# Patient Record
Sex: Male | Born: 1960 | Race: White | Hispanic: No | Smoking: Former smoker
Health system: Southern US, Community
[De-identification: ages and names within clinical notes are randomized; demographics above are authoritative.]

## PROBLEM LIST (undated history)

## (undated) ENCOUNTER — Emergency Department: Discharge status: Absent without leave | Payer: Medicaid Other

## (undated) DIAGNOSIS — Z720 Tobacco use: Secondary | ICD-10-CM

## (undated) DIAGNOSIS — I480 Paroxysmal atrial fibrillation: Secondary | ICD-10-CM

## (undated) DIAGNOSIS — I739 Peripheral vascular disease, unspecified: Secondary | ICD-10-CM

## (undated) DIAGNOSIS — Z9119 Patient's noncompliance with other medical treatment and regimen: Secondary | ICD-10-CM

## (undated) DIAGNOSIS — I499 Cardiac arrhythmia, unspecified: Secondary | ICD-10-CM

## (undated) DIAGNOSIS — E785 Hyperlipidemia, unspecified: Secondary | ICD-10-CM

## (undated) DIAGNOSIS — I5022 Chronic systolic (congestive) heart failure: Secondary | ICD-10-CM

## (undated) DIAGNOSIS — I429 Cardiomyopathy, unspecified: Secondary | ICD-10-CM

## (undated) DIAGNOSIS — E669 Obesity, unspecified: Secondary | ICD-10-CM

## (undated) DIAGNOSIS — I509 Heart failure, unspecified: Secondary | ICD-10-CM

## (undated) DIAGNOSIS — Z91199 Patient's noncompliance with other medical treatment and regimen due to unspecified reason: Secondary | ICD-10-CM

## (undated) DIAGNOSIS — I2510 Atherosclerotic heart disease of native coronary artery without angina pectoris: Secondary | ICD-10-CM

## (undated) DIAGNOSIS — I10 Essential (primary) hypertension: Secondary | ICD-10-CM

## (undated) DIAGNOSIS — E119 Type 2 diabetes mellitus without complications: Secondary | ICD-10-CM

## (undated) DIAGNOSIS — I255 Ischemic cardiomyopathy: Secondary | ICD-10-CM

## (undated) DIAGNOSIS — I219 Acute myocardial infarction, unspecified: Secondary | ICD-10-CM

## (undated) DIAGNOSIS — F419 Anxiety disorder, unspecified: Secondary | ICD-10-CM

## (undated) HISTORY — DX: Patient's noncompliance with other medical treatment and regimen: Z91.19

## (undated) HISTORY — PX: OTHER SURGICAL HISTORY: SHX169

## (undated) HISTORY — DX: Obesity, unspecified: E66.9

## (undated) HISTORY — DX: Essential (primary) hypertension: I10

## (undated) HISTORY — DX: Anxiety disorder, unspecified: F41.9

## (undated) HISTORY — DX: Ischemic cardiomyopathy: I25.5

## (undated) HISTORY — DX: Chronic systolic (congestive) heart failure: I50.22

## (undated) HISTORY — PX: CARDIAC CATHETERIZATION: SHX172

## (undated) HISTORY — DX: Patient's noncompliance with other medical treatment and regimen due to unspecified reason: Z91.199

## (undated) HISTORY — DX: Atherosclerotic heart disease of native coronary artery without angina pectoris: I25.10

## (undated) HISTORY — DX: Heart failure, unspecified: I50.9

## (undated) HISTORY — DX: Paroxysmal atrial fibrillation: I48.0

## (undated) HISTORY — DX: Cardiomyopathy, unspecified: I42.9

## (undated) HISTORY — DX: Hyperlipidemia, unspecified: E78.5

## (undated) HISTORY — DX: Tobacco use: Z72.0

## (undated) HISTORY — DX: Peripheral vascular disease, unspecified: I73.9

## (undated) HISTORY — DX: Acute myocardial infarction, unspecified: I21.9

## (undated) HISTORY — DX: Type 2 diabetes mellitus without complications: E11.9

## (undated) HISTORY — PX: LASIK: SHX215

---

## 2006-08-01 ENCOUNTER — Ambulatory Visit: Payer: Self-pay | Admitting: Family Medicine

## 2006-09-12 ENCOUNTER — Ambulatory Visit: Payer: Self-pay | Admitting: Family Medicine

## 2006-10-05 ENCOUNTER — Ambulatory Visit: Payer: Self-pay | Admitting: Family Medicine

## 2007-05-16 ENCOUNTER — Other Ambulatory Visit: Payer: Self-pay

## 2007-05-16 ENCOUNTER — Ambulatory Visit: Payer: Self-pay | Admitting: Family Medicine

## 2007-06-09 ENCOUNTER — Other Ambulatory Visit: Payer: Self-pay

## 2007-06-09 ENCOUNTER — Emergency Department: Payer: Self-pay | Admitting: Unknown Physician Specialty

## 2007-10-18 ENCOUNTER — Emergency Department: Payer: Self-pay | Admitting: Emergency Medicine

## 2007-10-18 ENCOUNTER — Other Ambulatory Visit: Payer: Self-pay

## 2013-07-05 DIAGNOSIS — I5022 Chronic systolic (congestive) heart failure: Secondary | ICD-10-CM

## 2013-07-05 HISTORY — DX: Chronic systolic (congestive) heart failure: I50.22

## 2013-07-20 LAB — CBC
HGB: 16.9 g/dL (ref 13.0–18.0)
MCHC: 34.6 g/dL (ref 32.0–36.0)
Platelet: 237 10*3/uL (ref 150–440)
RBC: 5.2 10*6/uL (ref 4.40–5.90)
RDW: 13.7 % (ref 11.5–14.5)

## 2013-07-21 ENCOUNTER — Inpatient Hospital Stay: Payer: Self-pay | Admitting: Internal Medicine

## 2013-07-21 DIAGNOSIS — I4891 Unspecified atrial fibrillation: Secondary | ICD-10-CM

## 2013-07-21 DIAGNOSIS — F17200 Nicotine dependence, unspecified, uncomplicated: Secondary | ICD-10-CM

## 2013-07-21 DIAGNOSIS — I214 Non-ST elevation (NSTEMI) myocardial infarction: Secondary | ICD-10-CM

## 2013-07-21 DIAGNOSIS — I5041 Acute combined systolic (congestive) and diastolic (congestive) heart failure: Secondary | ICD-10-CM

## 2013-07-21 DIAGNOSIS — I059 Rheumatic mitral valve disease, unspecified: Secondary | ICD-10-CM

## 2013-07-21 DIAGNOSIS — I4892 Unspecified atrial flutter: Secondary | ICD-10-CM

## 2013-07-21 LAB — COMPREHENSIVE METABOLIC PANEL
Albumin: 3.7 g/dL (ref 3.4–5.0)
Alkaline Phosphatase: 102 U/L (ref 50–136)
Anion Gap: 8 (ref 7–16)
Calcium, Total: 8.9 mg/dL (ref 8.5–10.1)
Chloride: 103 mmol/L (ref 98–107)
Co2: 27 mmol/L (ref 21–32)
Creatinine: 1.17 mg/dL (ref 0.60–1.30)
EGFR (African American): >60
Glucose: 192 mg/dL — ABNORMAL HIGH (ref 65–99)
Osmolality: 281 (ref 275–301)
SGPT (ALT): 25 U/L (ref 12–78)
Total Protein: 7.3 g/dL (ref 6.4–8.2)

## 2013-07-21 LAB — CK TOTAL AND CKMB (NOT AT ARMC)
CK, Total: 149 U/L (ref 35–232)
CK, Total: 184 U/L (ref 35–232)
CK, Total: 221 U/L (ref 35–232)
CK-MB: 3.8 ng/mL — ABNORMAL HIGH (ref 0.5–3.6)
CK-MB: 3.5 ng/mL (ref 0.5–3.6)

## 2013-07-21 LAB — TROPONIN I: Troponin-I: 0.12 ng/mL — ABNORMAL HIGH

## 2013-07-21 LAB — LIPID PANEL
Cholesterol: 135 mg/dL (ref 0–200)
HDL Cholesterol: 39 mg/dL — ABNORMAL LOW (ref 40–60)
VLDL Cholesterol, Calc: 30 mg/dL (ref 5–40)

## 2013-07-22 ENCOUNTER — Encounter: Payer: Self-pay | Admitting: *Deleted

## 2013-07-22 ENCOUNTER — Ambulatory Visit (INDEPENDENT_AMBULATORY_CARE_PROVIDER_SITE_OTHER): Payer: Self-pay | Admitting: Cardiovascular Disease

## 2013-07-22 ENCOUNTER — Encounter: Payer: Self-pay | Admitting: Cardiovascular Disease

## 2013-07-22 ENCOUNTER — Telehealth: Payer: Self-pay

## 2013-07-22 VITALS — BP 180/120 | HR 94 | Ht 73.0 in | Wt 243.0 lb

## 2013-07-22 DIAGNOSIS — I10 Essential (primary) hypertension: Secondary | ICD-10-CM

## 2013-07-22 DIAGNOSIS — I5022 Chronic systolic (congestive) heart failure: Secondary | ICD-10-CM

## 2013-07-22 DIAGNOSIS — R079 Chest pain, unspecified: Secondary | ICD-10-CM

## 2013-07-22 DIAGNOSIS — I4891 Unspecified atrial fibrillation: Secondary | ICD-10-CM

## 2013-07-22 DIAGNOSIS — E785 Hyperlipidemia, unspecified: Secondary | ICD-10-CM

## 2013-07-22 MED ORDER — AMLODIPINE BESYLATE 10 MG PO TABS: 10.0000 mg | ORAL_TABLET | Freq: Every day | ORAL | Status: DC

## 2013-07-22 MED ORDER — ROSUVASTATIN CALCIUM 20 MG PO TABS: 20.0000 mg | ORAL_TABLET | Freq: Every day | ORAL | Status: DC

## 2013-07-22 MED ORDER — RIVAROXABAN 20 MG PO TABS: 20.0000 mg | ORAL_TABLET | Freq: Every day | ORAL | Status: DC

## 2013-07-22 MED ORDER — LISINOPRIL 40 MG PO TABS: 40.0000 mg | ORAL_TABLET | Freq: Every day | ORAL | Status: DC

## 2013-07-22 MED ORDER — CARVEDILOL 12.5 MG PO TABS: 12.5000 mg | ORAL_TABLET | Freq: Two times a day (BID) | ORAL | Status: DC

## 2013-07-22 MED ORDER — METFORMIN HCL 1000 MG PO TABS: 1000.0000 mg | ORAL_TABLET | Freq: Two times a day (BID) | ORAL | Status: DC

## 2013-07-22 NOTE — Telephone Encounter (Signed)
Patient contacted regarding discharge from [facility] on 07/21/13.  # disconnected.

## 2013-07-22 NOTE — Progress Notes (Signed)
HPI  This is a 52 year old male who is here for a followup visit after recent hospitalization at [facility]. He has chronic medical conditions that include type 2 diabetes, hypertension, hyperlipidemia, tobacco use and obesity. He presented recently to [facility] with significant dyspnea and orthopnea. He ran out of his medications 2 months prior to presentation. He has no health insurance and usually follows up with the walk-in clinic. He is established through [employer] but for some reason his, he did not get his medications. He had significant exertional dyspnea and chest discomfort on presentation. His troponin was mildly elevated with a BNP of 1900. Blood pressure on presentation was 225/115. Shortly after hospital admission, he developed atrial fibrillation with rapid ventricular response with a heart rate of 150 beats per minute. He improved gradually with medical therapy. Echocardiogram showed an ejection fraction of 45-50% with inferior apical hypokinesis. We recommended ischemic cardiac evaluation while hospitalized but the patient declined. We also discussed anticoagulation given [place] VASc score of 3. We wanted the patient to establish some form of followup before initiating this. He was discharged home but has not received his medications yet. His blood pressure is elevated today. He still feels the same.  No Known Allergies   No current outpatient prescriptions on file prior to visit.   No current facility-administered medications on file prior to visit.     Past Medical History  Diagnosis Date  . Diabetes mellitus without complication   . Tobacco abuse   . Obesity   . Cardiomyopathy     45-50%  . CHF (congestive heart failure)   . Anxiety   . Medical non-compliance   . Chronic systolic heart failure 07/2013    Ejection fraction of 45-50% with possible inferior wall hypokinesis  . Hypertension   . Hyperlipidemia      Past Surgical History  Procedure Laterality Date  . Cardiac  catheterization      [facility]. no stent  . Knee surgery Right      Family History  Problem Relation Age of Onset  . Heart disease Mother      History   Social History  . Marital Status: Single    Spouse Name: N/A    Number of Children: N/A  . Years of Education: N/A   Occupational History  . Not on file.   Social History Main Topics  . Smoking status: Current Every Day Smoker -- 1.00 packs/day for 45 years    Types: Cigarettes  . Smokeless tobacco: Not on file  . Alcohol Use: Yes     Comment: occassional  . Drug Use: No  . Sexual Activity: Not on file   Other Topics Concern  . Not on file   Social History Narrative  . No narrative on file     PHYSICAL EXAM   BP 180/120  Pulse 94  Ht 6\' 1"  (1.854 m)  Wt 243 lb (110.224 kg)  BMI 32.07 kg/m2 Constitutional: He is oriented to person, place, and time. He appears well-developed and well-nourished. No distress.  HENT: No nasal discharge.  Head: Normocephalic and atraumatic.  Eyes: Pupils are equal and round.  No discharge. Neck: Normal range of motion. Neck supple. No JVD present. No thyromegaly present.  Cardiovascular: Normal rate, irregular rhythm, normal heart sounds. Exam reveals no gallop and no friction rub. No murmur heard.  Pulmonary/Chest: Effort normal and breath sounds normal. No stridor. No respiratory distress. He has no wheezes. He has no rales. He exhibits no tenderness.  Abdominal: Soft. Bowel sounds are normal. He exhibits no distension. There is no tenderness. There is no rebound and no guarding.  Musculoskeletal: Normal range of motion. He exhibits no edema and no tenderness.  Neurological: He is alert and oriented to person, place, and time. Coordination normal.  Skin: Skin is warm and dry. No rash noted. He is not diaphoretic. No erythema. No pallor.  Psychiatric: He has a normal mood and affect. His behavior is normal. Judgment and thought content normal.       EKG: Atrial  flutter-fibrillation  - frequent multiform ectopic ventricular beats  # VECs = 2, # types 2 -Nonspecific QRS widening and right axis -consider right ventricular hypertrophy.   -Nonspecific ST depression   +   Extensive T-abnormality  -Possible  Anterolateral and inferior  ischemia.    ASSESSMENT AND PLAN

## 2013-07-22 NOTE — Patient Instructions (Signed)
Take only the medications listed on this list. Multiple changes were made.   Follow up 1 week.

## 2013-07-29 ENCOUNTER — Encounter: Payer: Self-pay | Admitting: *Deleted

## 2013-07-29 ENCOUNTER — Ambulatory Visit: Payer: Self-pay | Admitting: Cardiovascular Disease

## 2013-07-29 ENCOUNTER — Encounter: Payer: Self-pay | Admitting: Cardiovascular Disease

## 2013-07-29 DIAGNOSIS — I480 Paroxysmal atrial fibrillation: Secondary | ICD-10-CM | POA: Insufficient documentation

## 2013-07-29 DIAGNOSIS — I10 Essential (primary) hypertension: Secondary | ICD-10-CM | POA: Insufficient documentation

## 2013-07-29 DIAGNOSIS — I4891 Unspecified atrial fibrillation: Secondary | ICD-10-CM | POA: Insufficient documentation

## 2013-07-29 DIAGNOSIS — E785 Hyperlipidemia, unspecified: Secondary | ICD-10-CM | POA: Insufficient documentation

## 2013-07-29 NOTE — Assessment & Plan Note (Addendum)
Likely worsened by uncontrolled hypertension. There is also high suspicion for underlying coronary artery disease given the wall motion abnormality noted on echocardiogram with multiple risk factors for coronary artery disease. At this point, I recommend aggressive control of his hypertension. I don't think clonidine is a good option due to the effect of rebound hypertension when he does not take the medication. I recommend stopping clonidine and metoprolol. Start carvedilol 12.5 mg twice daily. Start lisinopril 40 mg once daily. Check basic metabolic profile in one week. Continue treatment with amlodipine. Ischemic cardiac evaluation is needed once his blood pressure was controlled. He will need a close followup visit in one week in order to adjust his medications as needed.

## 2013-07-29 NOTE — Assessment & Plan Note (Signed)
Continue rate control with carvedilol. I discussed long-term anticoagulation and different options. We decided to start Xarelto 20 mg once daily.

## 2013-07-29 NOTE — Assessment & Plan Note (Signed)
Blood pressure is elevated. Multiple changes were made as outlined above.

## 2013-07-29 NOTE — Assessment & Plan Note (Signed)
Switching him to Crestor as he qualifies for assistance from [employer].

## 2013-08-11 ENCOUNTER — Inpatient Hospital Stay: Payer: Self-pay | Admitting: Internal Medicine

## 2013-08-11 LAB — BASIC METABOLIC PANEL
BUN: 18 mg/dL (ref 7–18)
Chloride: 100 mmol/L (ref 98–107)
Creatinine: 0.87 mg/dL (ref 0.60–1.30)
Glucose: 281 mg/dL — ABNORMAL HIGH (ref 65–99)
Osmolality: 280 (ref 275–301)
Sodium: 134 mmol/L — ABNORMAL LOW (ref 136–145)

## 2013-08-11 LAB — CBC
MCHC: 33.4 g/dL (ref 32.0–36.0)
MCV: 93 fL (ref 80–100)
Platelet: 225 10*3/uL (ref 150–440)
RDW: 13.5 % (ref 11.5–14.5)
WBC: 11.4 10*3/uL — ABNORMAL HIGH (ref 3.8–10.6)

## 2013-08-11 LAB — TROPONIN I: Troponin-I: 0.03 ng/mL

## 2013-08-11 LAB — CK TOTAL AND CKMB (NOT AT ARMC)
CK, Total: 69 U/L (ref 35–232)
CK-MB: 1.5 ng/mL (ref 0.5–3.6)

## 2013-08-12 DIAGNOSIS — I5043 Acute on chronic combined systolic (congestive) and diastolic (congestive) heart failure: Secondary | ICD-10-CM

## 2013-08-12 DIAGNOSIS — I4892 Unspecified atrial flutter: Secondary | ICD-10-CM

## 2013-08-12 DIAGNOSIS — I2510 Atherosclerotic heart disease of native coronary artery without angina pectoris: Secondary | ICD-10-CM

## 2013-08-12 DIAGNOSIS — I119 Hypertensive heart disease without heart failure: Secondary | ICD-10-CM

## 2013-08-12 DIAGNOSIS — I4891 Unspecified atrial fibrillation: Secondary | ICD-10-CM

## 2013-08-12 LAB — BASIC METABOLIC PANEL
BUN: 16 mg/dL (ref 7–18)
Chloride: 103 mmol/L (ref 98–107)
EGFR (African American): >60
EGFR (Non-African Amer.): >60
Potassium: 3.4 mmol/L — ABNORMAL LOW (ref 3.5–5.1)
Sodium: 137 mmol/L (ref 136–145)

## 2013-08-12 LAB — CBC WITH DIFFERENTIAL/PLATELET
Basophil #: 0.1 10*3/uL (ref 0.0–0.1)
Basophil %: 0.6 %
Eosinophil #: 0.2 10*3/uL (ref 0.0–0.7)
Eosinophil %: 1.7 %
HCT: 42.5 % (ref 40.0–52.0)
HGB: 14.5 g/dL (ref 13.0–18.0)
Lymphocyte #: 1.7 10*3/uL (ref 1.0–3.6)
MCHC: 34.2 g/dL (ref 32.0–36.0)
Monocyte #: 0.8 x10 3/mm (ref 0.2–1.0)
Neutrophil %: 72.5 %
Platelet: 205 10*3/uL (ref 150–440)
RBC: 4.58 10*6/uL (ref 4.40–5.90)

## 2013-08-12 LAB — TSH: Thyroid Stimulating Horm: 0.196 u[IU]/mL — ABNORMAL LOW

## 2013-08-12 LAB — HEMOGLOBIN A1C: Hemoglobin A1C: 9 % — ABNORMAL HIGH (ref 4.2–6.3)

## 2013-08-12 LAB — CK TOTAL AND CKMB (NOT AT ARMC): CK, Total: 56 U/L (ref 35–232)

## 2013-08-12 LAB — TROPONIN I: Troponin-I: 0.04 ng/mL

## 2013-08-14 ENCOUNTER — Telehealth: Payer: Self-pay | Admitting: *Deleted

## 2013-08-14 NOTE — Telephone Encounter (Signed)
Patient contacted regarding discharge from [facility] on 08/12/13  Patient understands to follow up with provider Arida on 08/21/13 at 11am at [place]. Patient understands discharge instructions? yes Patient understands medications and regiment? yes  Patient understands to bring all medications to this visit? yes

## 2013-08-21 ENCOUNTER — Ambulatory Visit (INDEPENDENT_AMBULATORY_CARE_PROVIDER_SITE_OTHER): Payer: Self-pay | Admitting: Cardiovascular Disease

## 2013-08-21 ENCOUNTER — Encounter: Payer: Self-pay | Admitting: Cardiovascular Disease

## 2013-08-21 VITALS — BP 146/95 | HR 91 | Ht 73.0 in | Wt 238.0 lb

## 2013-08-21 DIAGNOSIS — I2510 Atherosclerotic heart disease of native coronary artery without angina pectoris: Secondary | ICD-10-CM

## 2013-08-21 DIAGNOSIS — I5022 Chronic systolic (congestive) heart failure: Secondary | ICD-10-CM

## 2013-08-21 DIAGNOSIS — I4891 Unspecified atrial fibrillation: Secondary | ICD-10-CM

## 2013-08-21 DIAGNOSIS — I10 Essential (primary) hypertension: Secondary | ICD-10-CM

## 2013-08-21 MED ORDER — CARVEDILOL 25 MG PO TABS: 25.0000 mg | ORAL_TABLET | Freq: Two times a day (BID) | ORAL | Status: DC

## 2013-08-21 NOTE — Progress Notes (Signed)
HPI  This is a 52 year old male who is here for a followup visit after recent hospitalization at [facility]. He has chronic medical conditions that include chronic systolic heart failure, atrial fibrillation, type 2 diabetes, hypertension, hyperlipidemia, tobacco use and obesity. He was recently diagnosed with acute systolic heart failure in the setting of uncontrolled hypertension. His troponin was mildly elevated with a BNP of 1900. Blood pressure on presentation was 225/115. Shortly after hospital admission, he developed atrial fibrillation with rapid ventricular response with a heart rate of 150 beats per minute. He improved gradually with medical therapy. Echocardiogram showed an ejection fraction of 45-50% with inferior apical hypokinesis. Upon followup, he was again without medications and they arranged for him to get medications through [place] assistance program. He presented again to [facility] with acute on chronic systolic heart failure and was discharged home on furosemide. He has been doing better continues to have significant exertional dyspnea. He was noted to have coronary calcifications on CT scan. Previous cardiac catheterization in 1998 at [facility] showed significant two-vessel coronary artery disease with 75% in proximal RCA and 95% in the mid RCA (nondominant RCA) , 95% into OM 2 and 50% stenosis in mid LAD. Ejection fraction was normal. It appears that he was treated medically.  No Known Allergies   Current Outpatient Prescriptions on File Prior to Visit  Medication Sig Dispense Refill  . ALPRAZolam (XANAX) 0.25 MG tablet Take 0.25 mg by mouth at bedtime as needed for anxiety.      Marland Kitchen amLODipine (NORVASC) 10 MG tablet Take 1 tablet (10 mg total) by mouth daily.  30 tablet  6  . carvedilol (COREG) 12.5 MG tablet Take 1 tablet (12.5 mg total) by mouth 2 (two) times daily.  60 tablet  6  . lisinopril (PRINIVIL,ZESTRIL) 40 MG tablet Take 1 tablet (40 mg total) by mouth daily.  30 tablet  6  .  metFORMIN (GLUCOPHAGE) 1000 MG tablet Take 1 tablet (1,000 mg total) by mouth 2 (two) times daily with a meal.  60 tablet  6   No current facility-administered medications on file prior to visit.     Past Medical History  Diagnosis Date  . Diabetes mellitus without complication   . Tobacco abuse   . Obesity   . Cardiomyopathy     45-50%  . CHF (congestive heart failure)   . Anxiety   . Medical non-compliance   . Chronic systolic heart failure 07/2013    Ejection fraction of 45-50% with possible inferior wall hypokinesis  . Hypertension   . Hyperlipidemia   . MI (myocardial infarction)      Past Surgical History  Procedure Laterality Date  . Cardiac catheterization      [facility]. no stent  . Knee surgery Right      Family History  Problem Relation Age of Onset  . Heart disease Mother      History   Social History  . Marital Status: Single    Spouse Name: N/A    Number of Children: N/A  . Years of Education: N/A   Occupational History  . Not on file.   Social History Main Topics  . Smoking status: Current Every Day Smoker -- 1.00 packs/day for 45 years    Types: Cigarettes  . Smokeless tobacco: Not on file  . Alcohol Use: Yes     Comment: occassional  . Drug Use: No  . Sexual Activity: Not on file   Other Topics Concern  . Not  on file   Social History Narrative  . No narrative on file     PHYSICAL EXAM   BP 146/95  Pulse 91  Ht 6\' 1"  (1.854 m)  Wt 238 lb (107.956 kg)  BMI 31.41 kg/m2 Constitutional: He is oriented to person, place, and time. He appears well-developed and well-nourished. No distress.  HENT: No nasal discharge.  Head: Normocephalic and atraumatic.  Eyes: Pupils are equal and round.  No discharge. Neck: Normal range of motion. Neck supple. No JVD present. No thyromegaly present.  Cardiovascular: Normal rate, irregular rhythm, normal heart sounds. Exam reveals no gallop and no friction rub. No murmur heard.  Pulmonary/Chest:  Effort normal and breath sounds normal. No stridor. No respiratory distress. He has no wheezes. He has no rales. He exhibits no tenderness.  Abdominal: Soft. Bowel sounds are normal. He exhibits no distension. There is no tenderness. There is no rebound and no guarding.  Musculoskeletal: Normal range of motion. He exhibits no edema and no tenderness.  Neurological: He is alert and oriented to person, place, and time. Coordination normal.  Skin: Skin is warm and dry. No rash noted. He is not diaphoretic. No erythema. No pallor.  Psychiatric: He has a normal mood and affect. His behavior is normal. Judgment and thought content normal.       EKG: Atrial fibrillation   ASSESSMENT AND PLAN

## 2013-08-21 NOTE — Assessment & Plan Note (Signed)
The patient has known history of coronary artery disease based on cardiac catheterization in the past in 1998. Currently, he is having recurrent episodes of heart failure with mildly reduced LV systolic function. There is very high likelihood of coronary artery disease progression based on his multiple risk factors. His echo also showed wall motion abnormalities. Due to all of that, I recommend proceeding with cardiac catheterization and possible coronary intervention. Risks, benefits and alternatives were discussed with him.

## 2013-08-21 NOTE — Assessment & Plan Note (Signed)
Continue rate control. I'm increasing the dose of carvedilol to 25 mg twice daily. [place] VASc score is 4. He was started on Xarelto but we are waiting for the company's approval to provide with supplies. This will not be started until after his cardiac catheterization.

## 2013-08-21 NOTE — Assessment & Plan Note (Signed)
Blood pressure is still elevated. Increase the dose of carvedilol to 25 mg twice daily.

## 2013-08-21 NOTE — Assessment & Plan Note (Signed)
He appears to be euvolemic after the addition of small dose furosemide. He will be having routine labs today to check his renal function. Optimize blood pressure control with carvedilol and lisinopril.

## 2013-08-21 NOTE — Patient Instructions (Addendum)
[facility] Cardiac Cath Instructions   You are scheduled for a Cardiac Cath on:____December 23, 2014___  Please arrive at ___07:30am on the day of your procedure  You will need to pre-register prior to the day of your procedure.  Enter through the [employer] at [facility].  Registration is the first desk on your right.  Please take the procedure order we have given you in order to be registered appropriately  Do not eat/drink anything after midnight  Someone will need to drive you home  It is recommended someone be with you for the first 24 hours after your procedure  Wear clothes that are easy to get on/off and wear slip on shoes if possible   Medications bring a current list of all medications with you  ___ You may take all of your medications the morning of your procedure with enough water to swallow safely  _x_ Do not take these medications before your procedure:_Metformin_24 prior 48 hours after ____________________________________________________________________________________________________________________________________________________________________   Day of your procedure: Arrive at the [facility] entrance.  Free valet service is available.  After entering the [facility] please check-in at the registration desk (1st desk on your right) to receive your armband. After receiving your armband someone will escort you to the cardiac cath/special procedures waiting area.  The usual length of stay after your procedure is about 2 to 3 hours.  This can vary.  If you have any questions, please call our office at [phone], or you may call the cardiac cath lab at [facility] directly at [phone]  Your physician recommends that you return for lab work in:  Pre Cath Labs   Your physician has recommended you make the following change in your medication:  Increase Coreg to 25 mg twice a day

## 2013-08-22 LAB — BASIC METABOLIC PANEL
CO2: 19 mmol/L (ref 18–29)
Calcium: 10.1 mg/dL (ref 8.7–10.2)
Chloride: 95 mmol/L — ABNORMAL LOW (ref 97–108)
GFR calc non Af Amer: 82 mL/min/{1.73_m2} (ref >59)
Glucose: 249 mg/dL — ABNORMAL HIGH (ref 65–99)
Potassium: 4.4 mmol/L (ref 3.5–5.2)

## 2013-08-22 LAB — CBC WITH DIFFERENTIAL/PLATELET
Basophils Absolute: 0 10*3/uL (ref 0.0–0.2)
Basos: 1 %
Eosinophils Absolute: 0.3 10*3/uL (ref 0.0–0.4)
Immature Grans (Abs): 0 10*3/uL (ref 0.0–0.1)
Immature Granulocytes: 0 %
Lymphs: 25 %
MCH: 32.5 pg (ref 26.6–33.0)
MCHC: 34.3 g/dL (ref 31.5–35.7)
MCV: 95 fL (ref 79–97)
Monocytes Absolute: 0.5 10*3/uL (ref 0.1–0.9)
Neutrophils Relative %: 63 %
RBC: 5.2 x10E6/uL (ref 4.14–5.80)
RDW: 13.1 % (ref 12.3–15.4)

## 2013-08-22 LAB — PROTIME-INR
INR: 1.1 (ref 0.8–1.2)
Prothrombin Time: 10.8 s (ref 9.1–12.0)

## 2013-08-26 ENCOUNTER — Ambulatory Visit: Payer: Self-pay | Admitting: Cardiovascular Disease

## 2013-08-26 ENCOUNTER — Encounter: Payer: Self-pay | Admitting: Cardiovascular Disease

## 2013-08-26 DIAGNOSIS — I2510 Atherosclerotic heart disease of native coronary artery without angina pectoris: Secondary | ICD-10-CM

## 2013-09-03 ENCOUNTER — Encounter: Payer: Self-pay | Admitting: Surgery

## 2013-09-05 ENCOUNTER — Other Ambulatory Visit: Payer: Self-pay | Admitting: *Deleted

## 2013-09-05 ENCOUNTER — Institutional Professional Consult (permissible substitution) (INDEPENDENT_AMBULATORY_CARE_PROVIDER_SITE_OTHER): Payer: Self-pay | Admitting: Surgery

## 2013-09-05 ENCOUNTER — Encounter: Payer: Self-pay | Admitting: Surgery

## 2013-09-05 VITALS — BP 153/92 | HR 86 | Resp 16 | Ht 73.0 in | Wt 235.0 lb

## 2013-09-05 DIAGNOSIS — I2510 Atherosclerotic heart disease of native coronary artery without angina pectoris: Secondary | ICD-10-CM

## 2013-09-06 ENCOUNTER — Encounter: Payer: Self-pay | Admitting: Surgery

## 2013-09-06 NOTE — Progress Notes (Signed)
PCP is No PCP Per Patient Referring Provider is Marc [name], Marc A, MD  Chief Complaint  Patient presents with  . Coronary Artery Disease    eval for CABG...[facility]    HPI:  He is [name] with diabetes, HTN, and hyperlipidemia who has [name] known history of coronary disease by [facility]. He reportedly had significant 3-vessel disease with high grade stenosis in [name] nondominant RCA, 95% OM2, and 50% mid LAD with normal EF. He was treated medically. He recently presented with shortness of breath with mild exertion and was diagnosed with acute systolic heart failure in the setting of uncontrolled hypertension. His troponin was mildly elevated with [name] BNP of 1900. He developed [name]-fib with RVR after admission. An echo showed an EF of 45-50% with infero-apical hypokinesis. He improved with medical therapy. On followup he was not taking his medications and arrangements were made for him to get his meds through the [place] assistance program. He presented again to [facility] with acute on chronic systolic heart failure and was started on Lasix. He continues to have exertional dyspnea, fatigue but no chest pain. Cardiac cath now shows severe 3-vessel coronary disease with 60% distal LM, 80% LAD, 90% stenosis of 3 marginal branches in the dominant LCX, and an occluded nondominant RCA.  Past Medical History  Diagnosis Date  . Diabetes mellitus without complication   . Tobacco abuse   . Obesity   . Cardiomyopathy     45-50%  . CHF (congestive heart failure)   . Anxiety   . Medical non-compliance   . Chronic systolic heart failure 07/2013    Ejection fraction of 45-50% with possible inferior wall hypokinesis  . Hypertension   . Hyperlipidemia   . MI (myocardial infarction)   . Coronary artery disease     Past Surgical History  Procedure Laterality Date  . Cardiac catheterization      [facility] no stent  . Knee surgery Right     Family  History  Problem Relation Age of Onset  . Heart disease Mother     Social History History  Substance Use Topics  . Smoking status: Current Every Day Smoker -- 1.00 packs/day for 45 years    Types: Cigarettes  . Smokeless tobacco: Never Used  . Alcohol Use: No     Comment: occassional    Current Outpatient Prescriptions  Medication Sig Dispense Refill  . ALPRAZolam (XANAX) 0.25 MG tablet Take 0.25 mg by mouth at bedtime as needed for anxiety.      Marland Kitchen. amLODipine (NORVASC) 10 MG tablet Take 1 tablet (10 mg total) by mouth daily.  30 tablet  6  . aspirin 325 MG tablet Take 325 mg by mouth daily.      . carvedilol (COREG) 25 MG tablet Take 1 tablet (25 mg total) by mouth 2 (two) times daily.  60 tablet  6  . furosemide (LASIX) 40 MG tablet Take 40 mg by mouth daily.      Marland Kitchen. lisinopril (PRINIVIL,ZESTRIL) 40 MG tablet Take 1 tablet (40 mg total) by mouth daily.  30 tablet  6  . metFORMIN (GLUCOPHAGE) 1000 MG tablet Take 1 tablet (1,000 mg total) by mouth 2 (two) times daily with [name] meal.  60 tablet  6  . potassium chloride SA (K-DUR,KLOR-CON) 20 MEQ tablet Take 20 mEq by mouth daily.       No current facility-administered medications for this visit.  No Known Allergies  Review of Systems  Constitutional: Positive for activity change and fatigue.  HENT: Negative.   Eyes: Negative.   Respiratory: Positive for shortness of breath.   Cardiovascular: Negative for chest pain, palpitations and leg swelling.       No orthopnea or PND  Gastrointestinal: Negative.   Endocrine: Negative.   Genitourinary: Negative.   Musculoskeletal: Negative.   Skin: Negative.   Allergic/Immunologic: Negative.   Neurological: Negative.   Hematological: Negative.   Psychiatric/Behavioral: Negative.     BP 153/92  Pulse 86  Resp 16  Ht 6\' 1"  (1.854 m)  Wt 235 lb (106.595 kg)  BMI 31.01 kg/m2  SpO2 98% Physical Exam  Constitutional: He is oriented to person, place, and time. He appears  well-developed and well-nourished. No distress.  HENT:  Head: Normocephalic and atraumatic.  Mouth/Throat: Oropharynx is clear and moist.  Eyes: EOM are normal. Pupils are equal, round, and reactive to light.  Neck: Normal range of motion. Neck supple. No JVD present. No thyromegaly present.  Cardiovascular: Normal rate, regular rhythm, normal heart sounds and intact distal pulses.   No murmur heard. Pulmonary/Chest: Effort normal and breath sounds normal. No respiratory distress. He has no wheezes. He has no rales.  Abdominal: Soft. Bowel sounds are normal. He exhibits no distension and no mass. There is no tenderness.  Musculoskeletal: Normal range of motion. He exhibits no edema.  Neurological: He is alert and oriented to person, place, and time. He has normal strength. No cranial nerve deficit or sensory deficit.  Skin: Skin is warm and dry.  Psychiatric: He has [name] normal mood and affect.     Diagnostic Tests:  Cardiac cath films from [facility] reviewed by me in the cloud.   Impression:  He has left main and severe 3- vessel coronary disease with mild LV dysfunction and is having acute on chronic systolic heart failure with recurrent hospitalizations. I agree that CABG is indicated to prevent further ischemia, infarction, and heart failure. This will have to be followed by maximum cardiac risk factor reduction including smoking cessation. I discussed all of this with the patient. I discussed the operative procedure with the patient  including alternatives, benefits and risks; including but not limited to bleeding, blood transfusion, infection, stroke, myocardial infarction, graft failure, heart block requiring [name] permanent pacemaker, organ dysfunction, and death.  Marc [name] understands and agrees to proceed.    Plan:  We will plan surgery for 09/16/2012.

## 2013-09-08 ENCOUNTER — Encounter (HOSPITAL_COMMUNITY): Payer: Self-pay | Admitting: Respiratory Therapy

## 2013-09-11 ENCOUNTER — Encounter: Payer: Self-pay | Admitting: Cardiovascular Disease

## 2013-09-12 ENCOUNTER — Ambulatory Visit (HOSPITAL_COMMUNITY)
Admission: RE | Admit: 2013-09-12 | Discharge: 2013-09-12 | Disposition: A | Discharge status: Home / Self Care | Payer: Medicaid Other | Source: Ambulatory Visit | Attending: Surgery | Admitting: Surgery

## 2013-09-12 ENCOUNTER — Encounter (HOSPITAL_COMMUNITY)
Admission: RE | Admit: 2013-09-12 | Discharge: 2013-09-12 | Disposition: A | Discharge status: Home / Self Care | Payer: Medicaid Other | Source: Ambulatory Visit | Attending: Surgery | Admitting: Surgery

## 2013-09-12 ENCOUNTER — Encounter (HOSPITAL_COMMUNITY): Payer: Self-pay

## 2013-09-12 VITALS — BP 151/94 | HR 84 | Temp 98.1°F | Resp 18 | Ht 73.0 in | Wt 236.2 lb

## 2013-09-12 DIAGNOSIS — I2510 Atherosclerotic heart disease of native coronary artery without angina pectoris: Secondary | ICD-10-CM | POA: Insufficient documentation

## 2013-09-12 DIAGNOSIS — Z01811 Encounter for preprocedural respiratory examination: Secondary | ICD-10-CM | POA: Insufficient documentation

## 2013-09-12 DIAGNOSIS — I4460 Unspecified fascicular block: Secondary | ICD-10-CM | POA: Insufficient documentation

## 2013-09-12 DIAGNOSIS — Z01818 Encounter for other preprocedural examination: Secondary | ICD-10-CM | POA: Insufficient documentation

## 2013-09-12 DIAGNOSIS — E119 Type 2 diabetes mellitus without complications: Secondary | ICD-10-CM | POA: Insufficient documentation

## 2013-09-12 DIAGNOSIS — Z01810 Encounter for preprocedural cardiovascular examination: Secondary | ICD-10-CM

## 2013-09-12 DIAGNOSIS — I428 Other cardiomyopathies: Secondary | ICD-10-CM | POA: Insufficient documentation

## 2013-09-12 DIAGNOSIS — Z87891 Personal history of nicotine dependence: Secondary | ICD-10-CM | POA: Insufficient documentation

## 2013-09-12 DIAGNOSIS — Z01812 Encounter for preprocedural laboratory examination: Secondary | ICD-10-CM | POA: Insufficient documentation

## 2013-09-12 DIAGNOSIS — I658 Occlusion and stenosis of other precerebral arteries: Secondary | ICD-10-CM | POA: Insufficient documentation

## 2013-09-12 DIAGNOSIS — I10 Essential (primary) hypertension: Secondary | ICD-10-CM | POA: Insufficient documentation

## 2013-09-12 DIAGNOSIS — I252 Old myocardial infarction: Secondary | ICD-10-CM | POA: Insufficient documentation

## 2013-09-12 DIAGNOSIS — I6529 Occlusion and stenosis of unspecified carotid artery: Secondary | ICD-10-CM | POA: Insufficient documentation

## 2013-09-12 DIAGNOSIS — I4891 Unspecified atrial fibrillation: Secondary | ICD-10-CM | POA: Insufficient documentation

## 2013-09-12 DIAGNOSIS — R9431 Abnormal electrocardiogram [ECG] [EKG]: Secondary | ICD-10-CM | POA: Insufficient documentation

## 2013-09-12 HISTORY — DX: Cardiac arrhythmia, unspecified: I49.9

## 2013-09-12 LAB — PULMONARY FUNCTION TEST
DL/VA % pred: 99 %
DL/VA: 4.73 ml/min/mmHg/L
DLCO UNC % PRED: 96 %
DLCO unc: 33.75 ml/min/mmHg
FEF 25-75 Post: 3.81 L/sec
FEF 25-75 Pre: 3.37 L/sec
FEF2575-%Change-Post: 12 %
FEF2575-%Pred-Post: 110 %
FEF2575-%Pred-Pre: 97 %
FEV1-%CHANGE-POST: 4 %
FEV1-%Pred-Post: 90 %
FEV1-%Pred-Pre: 86 %
FEV1-Post: 3.68 L
FEV1-Pre: 3.52 L
FEV1FVC-%Change-Post: -3 %
FEV1FVC-%Pred-Pre: 104 %
FEV6-%Change-Post: 7 %
FEV6-%PRED-PRE: 86 %
FEV6-%Pred-Post: 93 %
FEV6-POST: 4.72 L
FEV6-Pre: 4.38 L
FEV6FVC-%CHANGE-POST: 0 %
FEV6FVC-%PRED-POST: 103 %
FEV6FVC-%Pred-Pre: 104 %
FVC-%CHANGE-POST: 8 %
FVC-%PRED-PRE: 82 %
FVC-%Pred-Post: 89 %
FVC-PRE: 4.38 L
FVC-Post: 4.74 L
POST FEV6/FVC RATIO: 100 %
PRE FEV1/FVC RATIO: 80 %
Post FEV1/FVC ratio: 78 %
Pre FEV6/FVC Ratio: 100 %
RV % pred: 84 %
RV: 1.89 L
TLC % PRED: 91 %
TLC: 6.78 L

## 2013-09-12 LAB — URINALYSIS, ROUTINE W REFLEX MICROSCOPIC
Bilirubin Urine: NEGATIVE
KETONES UR: 15 mg/dL — AB
LEUKOCYTES UA: NEGATIVE
Nitrite: NEGATIVE
PH: 5.5 (ref 5.0–8.0)
Protein, ur: 100 mg/dL — AB
Specific Gravity, Urine: 1.028 (ref 1.005–1.030)
Urobilinogen, UA: 0.2 mg/dL (ref 0.0–1.0)

## 2013-09-12 LAB — CBC
HCT: 44 % (ref 39.0–52.0)
Hemoglobin: 16 g/dL (ref 13.0–17.0)
MCH: 32.4 pg (ref 26.0–34.0)
MCHC: 36.4 g/dL — AB (ref 30.0–36.0)
MCV: 89.1 fL (ref 78.0–100.0)
Platelets: 247 10*3/uL (ref 150–400)
RBC: 4.94 MIL/uL (ref 4.22–5.81)
RDW: 12.9 % (ref 11.5–15.5)
WBC: 8.6 10*3/uL (ref 4.0–10.5)

## 2013-09-12 LAB — COMPREHENSIVE METABOLIC PANEL
ALT: 13 U/L (ref 0–53)
AST: 11 U/L (ref 0–37)
Albumin: 3.5 g/dL (ref 3.5–5.2)
Alkaline Phosphatase: 79 U/L (ref 39–117)
BILIRUBIN TOTAL: 0.3 mg/dL (ref 0.3–1.2)
BUN: 21 mg/dL (ref 6–23)
CHLORIDE: 96 meq/L (ref 96–112)
CO2: 22 meq/L (ref 19–32)
Calcium: 9.7 mg/dL (ref 8.4–10.5)
Creatinine, Ser: 0.84 mg/dL (ref 0.50–1.35)
Glucose, Bld: 274 mg/dL — ABNORMAL HIGH (ref 70–99)
POTASSIUM: 4 meq/L (ref 3.7–5.3)
SODIUM: 136 meq/L — AB (ref 137–147)
TOTAL PROTEIN: 7 g/dL (ref 6.0–8.3)

## 2013-09-12 LAB — BLOOD GAS, ARTERIAL
Acid-Base Excess: 2.3 mmol/L — ABNORMAL HIGH (ref 0.0–2.0)
BICARBONATE: 25.7 meq/L — AB (ref 20.0–24.0)
Drawn by: 344381
FIO2: 0.21 %
O2 Saturation: 95.9 %
PH ART: 7.476 — AB (ref 7.350–7.450)
PO2 ART: 79.6 mmHg — AB (ref 80.0–100.0)
Patient temperature: 98.6
TCO2: 26.8 mmol/L (ref 0–100)
pCO2 arterial: 35.2 mmHg (ref 35.0–45.0)

## 2013-09-12 LAB — PROTIME-INR
INR: 0.94 (ref 0.00–1.49)
Prothrombin Time: 12.4 seconds (ref 11.6–15.2)

## 2013-09-12 LAB — URINE MICROSCOPIC-ADD ON

## 2013-09-12 LAB — ABO/RH: ABO/RH(D): O POS

## 2013-09-12 LAB — SURGICAL PCR SCREEN
MRSA, PCR: NEGATIVE
Staphylococcus aureus: NEGATIVE

## 2013-09-12 LAB — HEMOGLOBIN A1C
HEMOGLOBIN A1C: 10.8 % — AB (ref <5.7)
Mean Plasma Glucose: 263 mg/dL — ABNORMAL HIGH (ref <117)

## 2013-09-12 LAB — TYPE AND SCREEN
ABO/RH(D): O POS
Antibody Screen: NEGATIVE

## 2013-09-12 LAB — APTT: aPTT: 32 seconds (ref 24–37)

## 2013-09-12 MED ORDER — ALBUTEROL SULFATE (2.5 MG/3ML) 0.083% IN NEBU
2.5000 mg | INHALATION_SOLUTION | Freq: Once | RESPIRATORY_TRACT | Status: AC
  Administered 2013-09-12: 2.5 mg via RESPIRATORY_TRACT

## 2013-09-12 NOTE — Progress Notes (Signed)
VASCULAR LAB PRELIMINARY  PRELIMINARY  PRELIMINARY  PRELIMINARY  Pre-op Cardiac Surgery  Carotid Findings:  Bilateral:  1-39% ICA stenosis.  Vertebral artery flow is antegrade.      Upper Extremity Right Left  Brachial Pressures 149 biphasic 149 biphasic  Radial Waveforms biphasic triphasic  Ulnar Waveforms biphasic biphasic  Palmar Arch (Allen's Test) WNL WNL   Findings:  Doppler waveforms remain normal with ulnar and radial compressions bilaterally.    Lower  Extremity Right Left  Dorsalis Pedis    Anterior Tibial 96 dampened monophasic 96 dampened monophasic  Posterior Tibial 114 monophasic 132 monophasic  Ankle/Brachial Indices 0.77 0.89    Findings:  ABI indicates a mild reduction in arterial flow bilaterally.   Davaris Youtsey, RVT 09/12/2013, 3:29 PM

## 2013-09-12 NOTE — Pre-Procedure Instructions (Signed)
Marc [name]  09/12/2013   Your procedure is scheduled on:  Jan. 13 @ 0730  Report to [facility]  2 * 3 at [phone].  Call this number if you have problems the morning of surgery: [phone]   Remember:   Do not eat food or drink liquids after midnight.   Take these medicines the morning of surgery with A SIP OF WATER: amlodipine (Norvasc), carvedilol (coreg) Take/Stop Aspirin as directed by your Dr. Stop taking Aleve, ibuprofen, BC's, Goody's Herbal Medications, Fish Oil     Do not wear jewelry, make-up or nail polish.  Do not wear lotions, powders, or perfumes. You may wear deodorant.  Do not shave 48 hours prior to surgery. Men may shave face and neck.  Do not bring valuables to the hospital.  [facility] is not responsible                  for any belongings or valuables.               Contacts, dentures or bridgework may not be worn into surgery.  Leave suitcase in the car. After surgery it may be brought to your room.  For patients admitted to the hospital, discharge time is determined by your                treatment team.               Patients discharged the day of surgery will not be allowed to drive  home.    Special Instructions: Incentive Spirometry - Practice and bring it with you on the day of surgery. Shower using CHG 2 nights before surgery and the night before surgery.  If you shower the day of surgery use CHG.  Use special wash - you have one bottle of CHG for all showers.  You should use approximately 1/3 of the bottle for each shower.   Please read over the following fact sheets that you were given: Pain Booklet, Coughing and Deep Breathing, Blood Transfusion Information, Open Heart Packet, MRSA Information and Surgical Site Infection Prevention

## 2013-09-12 NOTE — Progress Notes (Signed)
Denies having a PCP, [facility] Cardiologist is Dr Lorine Bears Card cath noted in epic 08-26-13 States that he ad a stress test and echo done many years ago, questioned and states that it was more than 5 years ago.

## 2013-09-12 NOTE — Progress Notes (Signed)
EKG abnormal. Reviewed by Revonda Standard, PA.

## 2013-09-15 ENCOUNTER — Encounter (HOSPITAL_COMMUNITY): Payer: Self-pay | Admitting: Certified Registered Nurse Anesthetist

## 2013-09-15 MED ORDER — SODIUM CHLORIDE 0.9 % IV SOLN
INTRAVENOUS | Status: DC
  Filled 2013-09-15: qty 1

## 2013-09-15 MED ORDER — PHENYLEPHRINE HCL 10 MG/ML IJ SOLN
30.0000 ug/min | INTRAMUSCULAR | Status: DC
  Filled 2013-09-15: qty 2

## 2013-09-15 MED ORDER — MAGNESIUM SULFATE 50 % IJ SOLN
40.0000 meq | INTRAMUSCULAR | Status: DC
  Filled 2013-09-15: qty 10

## 2013-09-15 MED ORDER — METOPROLOL TARTRATE 12.5 MG HALF TABLET: 12.5000 mg | ORAL_TABLET | Freq: Once | ORAL | Status: DC

## 2013-09-15 MED ORDER — DEXTROSE 5 % IV SOLN
1.5000 g | INTRAVENOUS | Status: AC
  Administered 2013-09-16: 1.5 g via INTRAVENOUS
  Administered 2013-09-16: .75 g via INTRAVENOUS
  Filled 2013-09-15 (×2): qty 1.5

## 2013-09-15 MED ORDER — DOPAMINE-DEXTROSE 3.2-5 MG/ML-% IV SOLN
2.0000 ug/kg/min | INTRAVENOUS | Status: DC
  Filled 2013-09-15: qty 250

## 2013-09-15 MED ORDER — DEXMEDETOMIDINE HCL IN NACL 400 MCG/100ML IV SOLN
0.1000 ug/kg/h | INTRAVENOUS | Status: DC
  Filled 2013-09-15: qty 100

## 2013-09-15 MED ORDER — NITROGLYCERIN IN D5W 200-5 MCG/ML-% IV SOLN
2.0000 ug/min | INTRAVENOUS | Status: DC
  Filled 2013-09-15: qty 250

## 2013-09-15 MED ORDER — PLASMA-LYTE 148 IV SOLN
INTRAVENOUS | Status: AC
  Administered 2013-09-16: 08:00:00
  Filled 2013-09-15: qty 2.5

## 2013-09-15 MED ORDER — DEXTROSE 5 % IV SOLN
750.0000 mg | INTRAVENOUS | Status: DC
  Filled 2013-09-15: qty 750

## 2013-09-15 MED ORDER — SODIUM CHLORIDE 0.9 % IV SOLN
INTRAVENOUS | Status: DC
  Filled 2013-09-15: qty 40

## 2013-09-15 MED ORDER — POTASSIUM CHLORIDE 2 MEQ/ML IV SOLN
80.0000 meq | INTRAVENOUS | Status: DC
  Filled 2013-09-15: qty 40

## 2013-09-15 MED ORDER — SODIUM CHLORIDE 0.9 % IV SOLN
INTRAVENOUS | Status: DC
  Filled 2013-09-15: qty 30

## 2013-09-15 MED ORDER — VANCOMYCIN HCL 10 G IV SOLR
1250.0000 mg | INTRAVENOUS | Status: AC
  Administered 2013-09-16: 1250 mg via INTRAVENOUS
  Filled 2013-09-15 (×2): qty 1250

## 2013-09-15 MED ORDER — CHLORHEXIDINE GLUCONATE 4 % EX LIQD: 30.0000 mL | CUTANEOUS | Status: DC

## 2013-09-15 MED ORDER — EPINEPHRINE HCL 1 MG/ML IJ SOLN
0.5000 ug/min | INTRAVENOUS | Status: DC
  Filled 2013-09-15: qty 4

## 2013-09-16 ENCOUNTER — Inpatient Hospital Stay (HOSPITAL_COMMUNITY): Payer: Medicaid Other | Admitting: Certified Registered"

## 2013-09-16 ENCOUNTER — Inpatient Hospital Stay (HOSPITAL_COMMUNITY): Payer: Medicaid Other

## 2013-09-16 ENCOUNTER — Inpatient Hospital Stay (HOSPITAL_COMMUNITY)
Admission: RE | Admit: 2013-09-16 | Discharge: 2013-09-24 | DRG: 235 | Disposition: A | Discharge status: Home / Self Care | Payer: Medicaid Other | Source: Ambulatory Visit | Attending: Surgery | Admitting: Surgery

## 2013-09-16 ENCOUNTER — Encounter (HOSPITAL_COMMUNITY): Payer: Self-pay

## 2013-09-16 ENCOUNTER — Encounter (HOSPITAL_COMMUNITY)
Admission: RE | Disposition: A | Discharge status: Home / Self Care | Payer: Medicaid Other | Source: Ambulatory Visit | Attending: Surgery

## 2013-09-16 ENCOUNTER — Encounter (HOSPITAL_COMMUNITY): Payer: Medicaid Other | Admitting: Vascular Surgery

## 2013-09-16 DIAGNOSIS — I5023 Acute on chronic systolic (congestive) heart failure: Secondary | ICD-10-CM | POA: Diagnosis present

## 2013-09-16 DIAGNOSIS — E669 Obesity, unspecified: Secondary | ICD-10-CM | POA: Diagnosis present

## 2013-09-16 DIAGNOSIS — D62 Acute posthemorrhagic anemia: Secondary | ICD-10-CM | POA: Diagnosis not present

## 2013-09-16 DIAGNOSIS — I2510 Atherosclerotic heart disease of native coronary artery without angina pectoris: Principal | ICD-10-CM | POA: Diagnosis present

## 2013-09-16 DIAGNOSIS — I10 Essential (primary) hypertension: Secondary | ICD-10-CM | POA: Diagnosis present

## 2013-09-16 DIAGNOSIS — I4891 Unspecified atrial fibrillation: Secondary | ICD-10-CM | POA: Diagnosis not present

## 2013-09-16 DIAGNOSIS — F411 Generalized anxiety disorder: Secondary | ICD-10-CM | POA: Diagnosis present

## 2013-09-16 DIAGNOSIS — I428 Other cardiomyopathies: Secondary | ICD-10-CM | POA: Diagnosis present

## 2013-09-16 DIAGNOSIS — F17200 Nicotine dependence, unspecified, uncomplicated: Secondary | ICD-10-CM | POA: Diagnosis present

## 2013-09-16 DIAGNOSIS — K5900 Constipation, unspecified: Secondary | ICD-10-CM | POA: Diagnosis not present

## 2013-09-16 DIAGNOSIS — IMO0001 Reserved for inherently not codable concepts without codable children: Secondary | ICD-10-CM | POA: Diagnosis present

## 2013-09-16 DIAGNOSIS — Z79899 Other long term (current) drug therapy: Secondary | ICD-10-CM | POA: Diagnosis not present

## 2013-09-16 DIAGNOSIS — I252 Old myocardial infarction: Secondary | ICD-10-CM | POA: Diagnosis not present

## 2013-09-16 DIAGNOSIS — Z7982 Long term (current) use of aspirin: Secondary | ICD-10-CM

## 2013-09-16 DIAGNOSIS — Z9119 Patient's noncompliance with other medical treatment and regimen: Secondary | ICD-10-CM

## 2013-09-16 DIAGNOSIS — Z91199 Patient's noncompliance with other medical treatment and regimen due to unspecified reason: Secondary | ICD-10-CM

## 2013-09-16 DIAGNOSIS — R0602 Shortness of breath: Secondary | ICD-10-CM | POA: Diagnosis present

## 2013-09-16 DIAGNOSIS — E785 Hyperlipidemia, unspecified: Secondary | ICD-10-CM | POA: Diagnosis present

## 2013-09-16 DIAGNOSIS — I509 Heart failure, unspecified: Secondary | ICD-10-CM | POA: Diagnosis present

## 2013-09-16 DIAGNOSIS — E1165 Type 2 diabetes mellitus with hyperglycemia: Secondary | ICD-10-CM

## 2013-09-16 HISTORY — PX: CORONARY ARTERY BYPASS GRAFT: SHX141

## 2013-09-16 HISTORY — PX: INTRAOPERATIVE TRANSESOPHAGEAL ECHOCARDIOGRAM: SHX5062

## 2013-09-16 LAB — CREATININE, SERUM
CREATININE: 0.81 mg/dL (ref 0.50–1.35)
GFR calc non Af Amer: >90 mL/min (ref >90)

## 2013-09-16 LAB — POCT I-STAT 3, VENOUS BLOOD GAS (G3P V)
ACID-BASE DEFICIT: 1 mmol/L (ref 0.0–2.0)
Bicarbonate: 24.9 mEq/L — ABNORMAL HIGH (ref 20.0–24.0)
Bicarbonate: 25.2 mEq/L — ABNORMAL HIGH (ref 20.0–24.0)
O2 SAT: 77 %
O2 Saturation: 100 %
TCO2: 26 mmol/L (ref 0–100)
TCO2: 26 mmol/L (ref 0–100)
pCO2, Ven: 35.4 mmHg — ABNORMAL LOW (ref 45.0–50.0)
pCO2, Ven: 41.6 mmHg — ABNORMAL LOW (ref 45.0–50.0)
pH, Ven: 7.371 — ABNORMAL HIGH (ref 7.250–7.300)
pH, Ven: 7.447 — ABNORMAL HIGH (ref 7.250–7.300)
pO2, Ven: 296 mmHg — ABNORMAL HIGH (ref 30.0–45.0)
pO2, Ven: 37 mmHg (ref 30.0–45.0)

## 2013-09-16 LAB — GLUCOSE, CAPILLARY
GLUCOSE-CAPILLARY: 84 mg/dL (ref 70–99)
GLUCOSE-CAPILLARY: 129 mg/dL — AB (ref 70–99)
GLUCOSE-CAPILLARY: 150 mg/dL — AB (ref 70–99)
GLUCOSE-CAPILLARY: 127 mg/dL — AB (ref 70–99)
Glucose-Capillary: 228 mg/dL — ABNORMAL HIGH (ref 70–99)
Glucose-Capillary: 93 mg/dL (ref 70–99)
Glucose-Capillary: 108 mg/dL — ABNORMAL HIGH (ref 70–99)
Glucose-Capillary: 110 mg/dL — ABNORMAL HIGH (ref 70–99)
Glucose-Capillary: 130 mg/dL — ABNORMAL HIGH (ref 70–99)
Glucose-Capillary: 135 mg/dL — ABNORMAL HIGH (ref 70–99)

## 2013-09-16 LAB — CBC
HCT: 34.5 % — ABNORMAL LOW (ref 39.0–52.0)
HCT: 37.4 % — ABNORMAL LOW (ref 39.0–52.0)
HEMOGLOBIN: 13.2 g/dL (ref 13.0–17.0)
Hemoglobin: 12.2 g/dL — ABNORMAL LOW (ref 13.0–17.0)
MCH: 31.4 pg (ref 26.0–34.0)
MCH: 31.5 pg (ref 26.0–34.0)
MCHC: 35.4 g/dL (ref 30.0–36.0)
MCHC: 35.3 g/dL (ref 30.0–36.0)
MCV: 88.9 fL (ref 78.0–100.0)
MCV: 89.3 fL (ref 78.0–100.0)
PLATELETS: 145 10*3/uL — AB (ref 150–400)
Platelets: 167 10*3/uL (ref 150–400)
RBC: 3.88 MIL/uL — ABNORMAL LOW (ref 4.22–5.81)
RBC: 4.19 MIL/uL — AB (ref 4.22–5.81)
RDW: 13.1 % (ref 11.5–15.5)
RDW: 13.2 % (ref 11.5–15.5)
WBC: 12.4 10*3/uL — ABNORMAL HIGH (ref 4.0–10.5)
WBC: 14.8 10*3/uL — AB (ref 4.0–10.5)

## 2013-09-16 LAB — POCT I-STAT 4, (NA,K, GLUC, HGB,HCT)
GLUCOSE: 221 mg/dL — AB (ref 70–99)
GLUCOSE: 127 mg/dL — AB (ref 70–99)
Glucose, Bld: 265 mg/dL — ABNORMAL HIGH (ref 70–99)
Glucose, Bld: 187 mg/dL — ABNORMAL HIGH (ref 70–99)
Glucose, Bld: 106 mg/dL — ABNORMAL HIGH (ref 70–99)
Glucose, Bld: 75 mg/dL (ref 70–99)
HCT: 41 % (ref 39.0–52.0)
HCT: 40 % (ref 39.0–52.0)
HCT: 33 % — ABNORMAL LOW (ref 39.0–52.0)
HCT: 33 % — ABNORMAL LOW (ref 39.0–52.0)
HEMATOCRIT: 35 % — AB (ref 39.0–52.0)
HEMATOCRIT: 34 % — AB (ref 39.0–52.0)
HEMOGLOBIN: 13.9 g/dL (ref 13.0–17.0)
HEMOGLOBIN: 11.2 g/dL — AB (ref 13.0–17.0)
HEMOGLOBIN: 11.6 g/dL — AB (ref 13.0–17.0)
Hemoglobin: 13.6 g/dL (ref 13.0–17.0)
Hemoglobin: 11.2 g/dL — ABNORMAL LOW (ref 13.0–17.0)
Hemoglobin: 11.9 g/dL — ABNORMAL LOW (ref 13.0–17.0)
POTASSIUM: 3.6 meq/L — AB (ref 3.7–5.3)
POTASSIUM: 3.7 meq/L (ref 3.7–5.3)
POTASSIUM: 3.7 meq/L (ref 3.7–5.3)
Potassium: 3.7 mEq/L (ref 3.7–5.3)
Potassium: 4.1 mEq/L (ref 3.7–5.3)
Potassium: 3.3 mEq/L — ABNORMAL LOW (ref 3.7–5.3)
SODIUM: 136 meq/L — AB (ref 137–147)
Sodium: 136 mEq/L — ABNORMAL LOW (ref 137–147)
Sodium: 137 mEq/L (ref 137–147)
Sodium: 135 mEq/L — ABNORMAL LOW (ref 137–147)
Sodium: 137 mEq/L (ref 137–147)
Sodium: 138 mEq/L (ref 137–147)

## 2013-09-16 LAB — POCT I-STAT 3, ART BLOOD GAS (G3+)
ACID-BASE DEFICIT: 2 mmol/L (ref 0.0–2.0)
Acid-base deficit: 2 mmol/L (ref 0.0–2.0)
Bicarbonate: 25.5 mEq/L — ABNORMAL HIGH (ref 20.0–24.0)
Bicarbonate: 26.3 mEq/L — ABNORMAL HIGH (ref 20.0–24.0)
Bicarbonate: 23 mEq/L (ref 20.0–24.0)
Bicarbonate: 23.1 mEq/L (ref 20.0–24.0)
O2 SAT: 99 %
O2 Saturation: 92 %
O2 Saturation: 91 %
O2 Saturation: 90 %
PH ART: 7.371 (ref 7.350–7.450)
PH ART: 7.377 (ref 7.350–7.450)
PH ART: 7.384 (ref 7.350–7.450)
Patient temperature: 37
Patient temperature: 37.4
TCO2: 27 mmol/L (ref 0–100)
TCO2: 28 mmol/L (ref 0–100)
TCO2: 24 mmol/L (ref 0–100)
TCO2: 24 mmol/L (ref 0–100)
pCO2 arterial: 44 mmHg (ref 35.0–45.0)
pCO2 arterial: 44.2 mmHg (ref 35.0–45.0)
pCO2 arterial: 38.5 mmHg (ref 35.0–45.0)
pCO2 arterial: 38.8 mmHg (ref 35.0–45.0)
pH, Arterial: 7.383 (ref 7.350–7.450)
pO2, Arterial: 120 mmHg — ABNORMAL HIGH (ref 80.0–100.0)
pO2, Arterial: 62 mmHg — ABNORMAL LOW (ref 80.0–100.0)
pO2, Arterial: 63 mmHg — ABNORMAL LOW (ref 80.0–100.0)
pO2, Arterial: 61 mmHg — ABNORMAL LOW (ref 80.0–100.0)

## 2013-09-16 LAB — APTT: aPTT: 39 seconds — ABNORMAL HIGH (ref 24–37)

## 2013-09-16 LAB — HEMOGLOBIN AND HEMATOCRIT, BLOOD
HCT: 33.6 % — ABNORMAL LOW (ref 39.0–52.0)
Hemoglobin: 12 g/dL — ABNORMAL LOW (ref 13.0–17.0)

## 2013-09-16 LAB — POCT I-STAT, CHEM 8
BUN: 12 mg/dL (ref 6–23)
Calcium, Ion: 1.29 mmol/L — ABNORMAL HIGH (ref 1.12–1.23)
Chloride: 103 mEq/L (ref 96–112)
Creatinine, Ser: 0.8 mg/dL (ref 0.50–1.35)
GLUCOSE: 132 mg/dL — AB (ref 70–99)
HEMATOCRIT: 37 % — AB (ref 39.0–52.0)
HEMOGLOBIN: 12.6 g/dL — AB (ref 13.0–17.0)
Potassium: 4.1 mEq/L (ref 3.7–5.3)
SODIUM: 134 meq/L — AB (ref 137–147)
TCO2: 23 mmol/L (ref 0–100)

## 2013-09-16 LAB — PROTIME-INR
INR: 1.26 (ref 0.00–1.49)
PROTHROMBIN TIME: 15.5 s — AB (ref 11.6–15.2)

## 2013-09-16 LAB — POCT I-STAT GLUCOSE
GLUCOSE: 151 mg/dL — AB (ref 70–99)
Operator id: 3390

## 2013-09-16 LAB — PLATELET COUNT: Platelets: 176 10*3/uL (ref 150–400)

## 2013-09-16 LAB — MAGNESIUM: MAGNESIUM: 2.6 mg/dL — AB (ref 1.5–2.5)

## 2013-09-16 SURGERY — CORONARY ARTERY BYPASS GRAFTING (CABG)
Anesthesia: General | Site: Chest

## 2013-09-16 MED ORDER — DEXMEDETOMIDINE HCL IN NACL 200 MCG/50ML IV SOLN
0.1000 ug/kg/h | INTRAVENOUS | Status: DC
  Administered 2013-09-16: 0.7 ug/kg/h via INTRAVENOUS
  Administered 2013-09-17: 0.1 ug/kg/h via INTRAVENOUS
  Filled 2013-09-16 (×3): qty 50

## 2013-09-16 MED ORDER — PROPOFOL 10 MG/ML IV BOLUS
INTRAVENOUS | Status: DC | PRN
  Administered 2013-09-16: 50 mg via INTRAVENOUS

## 2013-09-16 MED ORDER — PHENYLEPHRINE HCL 10 MG/ML IJ SOLN
10.0000 mg | INTRAMUSCULAR | Status: DC | PRN
  Administered 2013-09-16: 25 ug/min via INTRAVENOUS

## 2013-09-16 MED ORDER — SODIUM CHLORIDE 0.9 % IV SOLN: 200.0000 ug | INTRAVENOUS | Status: DC | PRN

## 2013-09-16 MED ORDER — NITROGLYCERIN IN D5W 200-5 MCG/ML-% IV SOLN: 0.0000 ug/min | INTRAVENOUS | Status: DC

## 2013-09-16 MED ORDER — SODIUM CHLORIDE 0.9 % IV SOLN
INTRAVENOUS | Status: DC
  Administered 2013-09-16: 14:00:00 via INTRAVENOUS

## 2013-09-16 MED ORDER — METOPROLOL TARTRATE 25 MG/10 ML ORAL SUSPENSION
12.5000 mg | Freq: Two times a day (BID) | ORAL | Status: DC
  Filled 2013-09-16 (×5): qty 5

## 2013-09-16 MED ORDER — ADULT MULTIVITAMIN W/MINERALS CH
1.0000 | ORAL_TABLET | Freq: Every day | ORAL | Status: DC
  Administered 2013-09-17 – 2013-09-24 (×8): 1 via ORAL
  Filled 2013-09-16 (×8): qty 1

## 2013-09-16 MED ORDER — FENTANYL CITRATE 0.05 MG/ML IJ SOLN
INTRAMUSCULAR | Status: DC | PRN
  Administered 2013-09-16 (×4): 100 ug via INTRAVENOUS
  Administered 2013-09-16: 250 ug via INTRAVENOUS
  Administered 2013-09-16 (×3): 50 ug via INTRAVENOUS
  Administered 2013-09-16 (×2): 100 ug via INTRAVENOUS
  Administered 2013-09-16: 50 ug via INTRAVENOUS
  Administered 2013-09-16 (×4): 100 ug via INTRAVENOUS
  Administered 2013-09-16: 50 ug via INTRAVENOUS

## 2013-09-16 MED ORDER — ACETAMINOPHEN 160 MG/5ML PO SOLN: 1000.0000 mg | Freq: Four times a day (QID) | ORAL | Status: DC

## 2013-09-16 MED ORDER — ONDANSETRON HCL 4 MG/2ML IJ SOLN: 4.0000 mg | Freq: Four times a day (QID) | INTRAMUSCULAR | Status: DC | PRN

## 2013-09-16 MED ORDER — LACTATED RINGERS IV SOLN
INTRAVENOUS | Status: DC | PRN
  Administered 2013-09-16: 07:00:00 via INTRAVENOUS

## 2013-09-16 MED ORDER — DOCUSATE SODIUM 100 MG PO CAPS
200.0000 mg | ORAL_CAPSULE | Freq: Every day | ORAL | Status: DC
  Administered 2013-09-17: 200 mg via ORAL
  Filled 2013-09-16: qty 2

## 2013-09-16 MED ORDER — SODIUM CHLORIDE 0.45 % IV SOLN
INTRAVENOUS | Status: DC
  Administered 2013-09-16: 14:00:00 via INTRAVENOUS

## 2013-09-16 MED ORDER — MIDAZOLAM HCL 2 MG/2ML IJ SOLN: 2.0000 mg | INTRAMUSCULAR | Status: DC | PRN

## 2013-09-16 MED ORDER — LACTATED RINGERS IV SOLN: 500.0000 mL | Freq: Once | INTRAVENOUS | Status: AC | PRN

## 2013-09-16 MED ORDER — FAMOTIDINE IN NACL 20-0.9 MG/50ML-% IV SOLN
20.0000 mg | Freq: Two times a day (BID) | INTRAVENOUS | Status: AC
  Administered 2013-09-16: 20 mg via INTRAVENOUS
  Filled 2013-09-16: qty 50

## 2013-09-16 MED ORDER — THROMBIN 20000 UNITS EX SOLR
CUTANEOUS | Status: DC | PRN
  Administered 2013-09-16: 20000 [IU] via TOPICAL

## 2013-09-16 MED ORDER — SODIUM CHLORIDE 0.9 % IJ SOLN: 3.0000 mL | INTRAMUSCULAR | Status: DC | PRN

## 2013-09-16 MED ORDER — ALBUMIN HUMAN 5 % IV SOLN: 250.0000 mL | INTRAVENOUS | Status: AC | PRN

## 2013-09-16 MED ORDER — BISACODYL 5 MG PO TBEC
10.0000 mg | DELAYED_RELEASE_TABLET | Freq: Every day | ORAL | Status: DC
  Administered 2013-09-17: 10 mg via ORAL
  Filled 2013-09-16: qty 2

## 2013-09-16 MED ORDER — PHENYLEPHRINE HCL 10 MG/ML IJ SOLN
0.0000 ug/min | INTRAVENOUS | Status: DC
  Filled 2013-09-16: qty 2

## 2013-09-16 MED ORDER — LACTATED RINGERS IV SOLN
INTRAVENOUS | Status: DC | PRN
  Administered 2013-09-16 (×2): via INTRAVENOUS

## 2013-09-16 MED ORDER — THROMBIN 20000 UNITS EX SOLR
CUTANEOUS | Status: AC
  Filled 2013-09-16: qty 20000

## 2013-09-16 MED ORDER — SODIUM CHLORIDE 0.9 % IV SOLN
200.0000 ug | INTRAVENOUS | Status: DC | PRN
  Administered 2013-09-16: .3 ug/kg/h via INTRAVENOUS

## 2013-09-16 MED ORDER — ACETAMINOPHEN 650 MG RE SUPP
650.0000 mg | Freq: Once | RECTAL | Status: AC
  Administered 2013-09-16: 650 mg via RECTAL

## 2013-09-16 MED ORDER — MAGNESIUM SULFATE 40 MG/ML IJ SOLN
4.0000 g | Freq: Once | INTRAMUSCULAR | Status: AC
  Administered 2013-09-16: 4 g via INTRAVENOUS
  Filled 2013-09-16: qty 100

## 2013-09-16 MED ORDER — INSULIN REGULAR BOLUS VIA INFUSION
0.0000 [IU] | Freq: Three times a day (TID) | INTRAVENOUS | Status: DC
  Administered 2013-09-17: 4.1 [IU] via INTRAVENOUS
  Administered 2013-09-17: 1.9 [IU] via INTRAVENOUS
  Filled 2013-09-16: qty 10

## 2013-09-16 MED ORDER — MIDAZOLAM HCL 5 MG/5ML IJ SOLN
INTRAMUSCULAR | Status: DC | PRN
  Administered 2013-09-16 (×2): 5 mg via INTRAVENOUS
  Administered 2013-09-16: 2 mg via INTRAVENOUS
  Administered 2013-09-16: 3 mg via INTRAVENOUS
  Administered 2013-09-16: 5 mg via INTRAVENOUS

## 2013-09-16 MED ORDER — METOPROLOL TARTRATE 12.5 MG HALF TABLET
12.5000 mg | ORAL_TABLET | Freq: Two times a day (BID) | ORAL | Status: DC
Start: 2013-09-16 — End: 2013-09-18
  Administered 2013-09-17 (×2): 12.5 mg via ORAL
  Filled 2013-09-16 (×5): qty 1

## 2013-09-16 MED ORDER — 0.9 % SODIUM CHLORIDE (POUR BTL) OPTIME
TOPICAL | Status: DC | PRN
  Administered 2013-09-16: 6000 mL

## 2013-09-16 MED ORDER — ASPIRIN 81 MG PO CHEW: 324.0000 mg | CHEWABLE_TABLET | Freq: Every day | ORAL | Status: DC

## 2013-09-16 MED ORDER — LACTATED RINGERS IV SOLN
INTRAVENOUS | Status: DC
  Administered 2013-09-16: 14:00:00 via INTRAVENOUS

## 2013-09-16 MED ORDER — HEMOSTATIC AGENTS (NO CHARGE) OPTIME
TOPICAL | Status: DC | PRN
  Administered 2013-09-16: 1 via TOPICAL

## 2013-09-16 MED ORDER — MORPHINE SULFATE 2 MG/ML IJ SOLN
2.0000 mg | INTRAMUSCULAR | Status: DC | PRN
  Administered 2013-09-16: 4 mg via INTRAVENOUS
  Administered 2013-09-16: 2 mg via INTRAVENOUS
  Administered 2013-09-17 (×5): 4 mg via INTRAVENOUS
  Filled 2013-09-16 (×5): qty 2
  Filled 2013-09-16: qty 1
  Filled 2013-09-16: qty 2

## 2013-09-16 MED ORDER — LACTATED RINGERS IV SOLN
INTRAVENOUS | Status: DC | PRN
  Administered 2013-09-16 (×2): via INTRAVENOUS

## 2013-09-16 MED ORDER — THROMBIN 20000 UNITS EX SOLR
CUTANEOUS | Status: DC | PRN
  Administered 2013-09-16 (×3): via TOPICAL

## 2013-09-16 MED ORDER — BISACODYL 10 MG RE SUPP: 10.0000 mg | Freq: Every day | RECTAL | Status: DC

## 2013-09-16 MED ORDER — VANCOMYCIN HCL IN DEXTROSE 1-5 GM/200ML-% IV SOLN
1000.0000 mg | Freq: Once | INTRAVENOUS | Status: AC
  Administered 2013-09-16: 1000 mg via INTRAVENOUS
  Filled 2013-09-16: qty 200

## 2013-09-16 MED ORDER — MORPHINE SULFATE 2 MG/ML IJ SOLN
1.0000 mg | INTRAMUSCULAR | Status: AC | PRN
  Administered 2013-09-16: 2 mg via INTRAVENOUS
  Filled 2013-09-16: qty 1

## 2013-09-16 MED ORDER — SODIUM CHLORIDE 0.9 % IV SOLN
250.0000 mL | INTRAVENOUS | Status: DC
Start: 2013-09-17 — End: 2013-09-18
  Administered 2013-09-17: 250 mL via INTRAVENOUS

## 2013-09-16 MED ORDER — HEPARIN SODIUM (PORCINE) 1000 UNIT/ML IJ SOLN
INTRAMUSCULAR | Status: DC | PRN
  Administered 2013-09-16: 37 mL via INTRAVENOUS

## 2013-09-16 MED ORDER — DEXTROSE 5 % IV SOLN
1.5000 g | Freq: Two times a day (BID) | INTRAVENOUS | Status: AC
  Administered 2013-09-16 – 2013-09-18 (×4): 1.5 g via INTRAVENOUS
  Filled 2013-09-16 (×4): qty 1.5

## 2013-09-16 MED ORDER — POTASSIUM CHLORIDE 10 MEQ/50ML IV SOLN
10.0000 meq | INTRAVENOUS | Status: AC
  Administered 2013-09-16 (×3): 10 meq via INTRAVENOUS

## 2013-09-16 MED ORDER — AMINOCAPROIC ACID 250 MG/ML IV SOLN
10.0000 g | INTRAVENOUS | Status: DC | PRN
  Administered 2013-09-16: 5 g/h via INTRAVENOUS

## 2013-09-16 MED ORDER — NITROGLYCERIN IN D5W 200-5 MCG/ML-% IV SOLN
INTRAVENOUS | Status: DC | PRN
  Administered 2013-09-16: 10 ug/min via INTRAVENOUS

## 2013-09-16 MED ORDER — INSULIN REGULAR HUMAN 100 UNIT/ML IJ SOLN
100.0000 [IU] | INTRAMUSCULAR | Status: DC | PRN
  Administered 2013-09-16: 6.2 [IU]/h via INTRAVENOUS

## 2013-09-16 MED ORDER — ACETAMINOPHEN 500 MG PO TABS
1000.0000 mg | ORAL_TABLET | Freq: Four times a day (QID) | ORAL | Status: DC
  Administered 2013-09-17 – 2013-09-18 (×5): 1000 mg via ORAL
  Filled 2013-09-16 (×9): qty 2

## 2013-09-16 MED ORDER — PROTAMINE SULFATE 10 MG/ML IV SOLN
INTRAVENOUS | Status: DC | PRN
  Administered 2013-09-16: 350 mg via INTRAVENOUS

## 2013-09-16 MED ORDER — SIMVASTATIN 20 MG PO TABS
20.0000 mg | ORAL_TABLET | Freq: Every day | ORAL | Status: DC
  Administered 2013-09-17 – 2013-09-23 (×7): 20 mg via ORAL
  Filled 2013-09-16 (×8): qty 1

## 2013-09-16 MED ORDER — ROCURONIUM BROMIDE 100 MG/10ML IV SOLN
INTRAVENOUS | Status: DC | PRN
  Administered 2013-09-16 (×4): 50 mg via INTRAVENOUS

## 2013-09-16 MED ORDER — POTASSIUM CHLORIDE 10 MEQ/50ML IV SOLN
10.0000 meq | INTRAVENOUS | Status: AC | PRN
  Administered 2013-09-16 (×2): 10 meq via INTRAVENOUS

## 2013-09-16 MED ORDER — ACETAMINOPHEN 160 MG/5ML PO SOLN: 650.0000 mg | Freq: Once | ORAL | Status: AC

## 2013-09-16 MED ORDER — SODIUM CHLORIDE 0.9 % IJ SOLN
3.0000 mL | Freq: Two times a day (BID) | INTRAMUSCULAR | Status: DC
  Administered 2013-09-17 (×2): 3 mL via INTRAVENOUS

## 2013-09-16 MED ORDER — PANTOPRAZOLE SODIUM 40 MG PO TBEC: 40.0000 mg | DELAYED_RELEASE_TABLET | Freq: Every day | ORAL | Status: DC

## 2013-09-16 MED ORDER — ASPIRIN EC 325 MG PO TBEC
325.0000 mg | DELAYED_RELEASE_TABLET | Freq: Every day | ORAL | Status: DC
  Administered 2013-09-17: 325 mg via ORAL
  Filled 2013-09-16 (×2): qty 1

## 2013-09-16 MED ORDER — INSULIN REGULAR HUMAN 100 UNIT/ML IJ SOLN
INTRAMUSCULAR | Status: DC
  Administered 2013-09-16: 1 [IU]/h via INTRAVENOUS
  Administered 2013-09-16: 1.4 [IU]/h via INTRAVENOUS
  Administered 2013-09-16: 1 [IU]/h via INTRAVENOUS
  Administered 2013-09-16: 0.7 [IU]/h via INTRAVENOUS
  Filled 2013-09-16 (×3): qty 1

## 2013-09-16 MED ORDER — METOPROLOL TARTRATE 1 MG/ML IV SOLN: 2.5000 mg | INTRAVENOUS | Status: DC | PRN

## 2013-09-16 MED ORDER — OXYCODONE HCL 5 MG PO TABS
5.0000 mg | ORAL_TABLET | ORAL | Status: DC | PRN
  Administered 2013-09-17 – 2013-09-18 (×7): 10 mg via ORAL
  Filled 2013-09-16 (×7): qty 2

## 2013-09-16 SURGICAL SUPPLY — 103 items
ATTRACTOMAT 16X20 MAGNETIC DRP (DRAPES) ×4 IMPLANT
BAG DECANTER FOR FLEXI CONT (MISCELLANEOUS) ×4 IMPLANT
BANDAGE ELASTIC 4 VELCRO ST LF (GAUZE/BANDAGES/DRESSINGS) ×4 IMPLANT
BANDAGE ELASTIC 6 VELCRO ST LF (GAUZE/BANDAGES/DRESSINGS) ×4 IMPLANT
BANDAGE GAUZE ELAST BULKY 4 IN (GAUZE/BANDAGES/DRESSINGS) ×4 IMPLANT
BASKET HEART  (ORDER IN 25'S) (MISCELLANEOUS) ×1
BASKET HEART (ORDER IN 25'S) (MISCELLANEOUS) ×1
BASKET HEART (ORDER IN 25S) (MISCELLANEOUS) ×2 IMPLANT
BLADE 11 SAFETY STRL DISP (BLADE) ×4 IMPLANT
BLADE STERNUM SYSTEM 6 (BLADE) ×4 IMPLANT
BNDG GAUZE ELAST 4 BULKY (GAUZE/BANDAGES/DRESSINGS) ×4 IMPLANT
CANISTER SUCTION 2500CC (MISCELLANEOUS) ×4 IMPLANT
CARDIAC SUCTION (MISCELLANEOUS) ×4 IMPLANT
CATH ROBINSON RED A/P 18FR (CATHETERS) ×8 IMPLANT
CATH THORACIC 28FR (CATHETERS) ×4 IMPLANT
CATH THORACIC 36FR (CATHETERS) ×4 IMPLANT
CATH THORACIC 36FR RT ANG (CATHETERS) ×4 IMPLANT
CLIP TI MEDIUM 24 (CLIP) IMPLANT
CLIP TI WIDE RED SMALL 24 (CLIP) ×8 IMPLANT
COVER SURGICAL LIGHT HANDLE (MISCELLANEOUS) ×4 IMPLANT
CRADLE DONUT ADULT HEAD (MISCELLANEOUS) ×4 IMPLANT
DERMABOND ADVANCED (GAUZE/BANDAGES/DRESSINGS) ×2
DERMABOND ADVANCED .7 DNX12 (GAUZE/BANDAGES/DRESSINGS) ×2 IMPLANT
DRAPE CARDIOVASCULAR INCISE (DRAPES) ×2
DRAPE SLUSH/WARMER DISC (DRAPES) ×4 IMPLANT
DRAPE SRG 135X102X78XABS (DRAPES) ×2 IMPLANT
DRSG COVADERM 4X14 (GAUZE/BANDAGES/DRESSINGS) ×4 IMPLANT
ELECT CAUTERY BLADE 6.4 (BLADE) ×4 IMPLANT
ELECT REM PT RETURN 9FT ADLT (ELECTROSURGICAL) ×8
ELECTRODE REM PT RTRN 9FT ADLT (ELECTROSURGICAL) ×4 IMPLANT
GLOVE BIO SURGEON STRL SZ 6 (GLOVE) IMPLANT
GLOVE BIO SURGEON STRL SZ 6.5 (GLOVE) ×18 IMPLANT
GLOVE BIO SURGEON STRL SZ7 (GLOVE) ×8 IMPLANT
GLOVE BIO SURGEON STRL SZ7.5 (GLOVE) IMPLANT
GLOVE BIO SURGEONS STRL SZ 6.5 (GLOVE) ×6
GLOVE BIOGEL PI IND STRL 6 (GLOVE) IMPLANT
GLOVE BIOGEL PI IND STRL 6.5 (GLOVE) ×6 IMPLANT
GLOVE BIOGEL PI IND STRL 7.0 (GLOVE) ×4 IMPLANT
GLOVE BIOGEL PI INDICATOR 6 (GLOVE)
GLOVE BIOGEL PI INDICATOR 6.5 (GLOVE) ×6
GLOVE BIOGEL PI INDICATOR 7.0 (GLOVE) ×4
GLOVE EUDERMIC 7 POWDERFREE (GLOVE) ×8 IMPLANT
GLOVE ORTHO TXT STRL SZ7.5 (GLOVE) IMPLANT
GOWN PREVENTION PLUS XLARGE (GOWN DISPOSABLE) ×4 IMPLANT
GOWN STRL NON-REIN LRG LVL3 (GOWN DISPOSABLE) ×24 IMPLANT
HEMOSTAT POWDER SURGIFOAM 1G (HEMOSTASIS) ×12 IMPLANT
HEMOSTAT SURGICEL 2X14 (HEMOSTASIS) ×4 IMPLANT
INSERT FOGARTY 61MM (MISCELLANEOUS) IMPLANT
INSERT FOGARTY XLG (MISCELLANEOUS) IMPLANT
KIT BASIN OR (CUSTOM PROCEDURE TRAY) ×4 IMPLANT
KIT CATH CPB BARTLE (MISCELLANEOUS) ×4 IMPLANT
KIT ROOM TURNOVER OR (KITS) ×4 IMPLANT
KIT SUCTION CATH 14FR (SUCTIONS) ×4 IMPLANT
KIT VASOVIEW W/TROCAR VH 2000 (KITS) ×4 IMPLANT
NS IRRIG 1000ML POUR BTL (IV SOLUTION) ×24 IMPLANT
PACK OPEN HEART (CUSTOM PROCEDURE TRAY) ×4 IMPLANT
PAD ARMBOARD 7.5X6 YLW CONV (MISCELLANEOUS) ×8 IMPLANT
PAD ELECT DEFIB RADIOL ZOLL (MISCELLANEOUS) ×4 IMPLANT
PENCIL BUTTON HOLSTER BLD 10FT (ELECTRODE) ×4 IMPLANT
PUNCH AORTIC ROTATE 4.0MM (MISCELLANEOUS) ×4 IMPLANT
PUNCH AORTIC ROTATE 4.5MM 8IN (MISCELLANEOUS) ×4 IMPLANT
PUNCH AORTIC ROTATE 5MM 8IN (MISCELLANEOUS) IMPLANT
SET CARDIOPLEGIA MPS 5001102 (MISCELLANEOUS) ×4 IMPLANT
SPONGE GAUZE 4X4 12PLY (GAUZE/BANDAGES/DRESSINGS) ×8 IMPLANT
SPONGE GAUZE 4X4 12PLY STER LF (GAUZE/BANDAGES/DRESSINGS) ×8 IMPLANT
SPONGE INTESTINAL PEANUT (DISPOSABLE) IMPLANT
SPONGE LAP 18X18 X RAY DECT (DISPOSABLE) ×8 IMPLANT
SPONGE LAP 4X18 X RAY DECT (DISPOSABLE) ×4 IMPLANT
SUT BONE WAX W31G (SUTURE) ×4 IMPLANT
SUT MNCRL AB 4-0 PS2 18 (SUTURE) IMPLANT
SUT PROLENE 3 0 SH DA (SUTURE) IMPLANT
SUT PROLENE 3 0 SH1 36 (SUTURE) ×4 IMPLANT
SUT PROLENE 4 0 RB 1 (SUTURE)
SUT PROLENE 4 0 SH DA (SUTURE) IMPLANT
SUT PROLENE 4-0 RB1 .5 CRCL 36 (SUTURE) IMPLANT
SUT PROLENE 5 0 C 1 36 (SUTURE) IMPLANT
SUT PROLENE 6 0 C 1 30 (SUTURE) IMPLANT
SUT PROLENE 7 0 BV 1 (SUTURE) IMPLANT
SUT PROLENE 7 0 BV1 MDA (SUTURE) ×12 IMPLANT
SUT PROLENE 8 0 BV175 6 (SUTURE) IMPLANT
SUT SILK  1 MH (SUTURE)
SUT SILK 1 MH (SUTURE) IMPLANT
SUT STEEL STERNAL CCS#1 18IN (SUTURE) IMPLANT
SUT STEEL SZ 6 DBL 3X14 BALL (SUTURE) ×12 IMPLANT
SUT VIC AB 1 CTX 36 (SUTURE) ×4
SUT VIC AB 1 CTX36XBRD ANBCTR (SUTURE) ×4 IMPLANT
SUT VIC AB 2-0 CT1 27 (SUTURE) ×2
SUT VIC AB 2-0 CT1 TAPERPNT 27 (SUTURE) ×2 IMPLANT
SUT VIC AB 2-0 CTX 27 (SUTURE) IMPLANT
SUT VIC AB 3-0 SH 27 (SUTURE) ×2
SUT VIC AB 3-0 SH 27X BRD (SUTURE) ×2 IMPLANT
SUT VIC AB 3-0 X1 27 (SUTURE) IMPLANT
SUT VICRYL 4-0 PS2 18IN ABS (SUTURE) ×4 IMPLANT
SUTURE E-PAK OPEN HEART (SUTURE) ×4 IMPLANT
SYSTEM SAHARA CHEST DRAIN ATS (WOUND CARE) ×4 IMPLANT
TAPE CLOTH SURG 4X10 WHT LF (GAUZE/BANDAGES/DRESSINGS) ×4 IMPLANT
TAPE PAPER 2X10 WHT MICROPORE (GAUZE/BANDAGES/DRESSINGS) ×4 IMPLANT
TOWEL OR 17X24 6PK STRL BLUE (TOWEL DISPOSABLE) ×4 IMPLANT
TOWEL OR 17X26 10 PK STRL BLUE (TOWEL DISPOSABLE) ×4 IMPLANT
TRAY FOLEY IC TEMP SENS 14FR (CATHETERS) ×4 IMPLANT
TUBING INSUFFLATION 10FT LAP (TUBING) ×4 IMPLANT
UNDERPAD 30X30 INCONTINENT (UNDERPADS AND DIAPERS) ×4 IMPLANT
WATER STERILE IRR 1000ML POUR (IV SOLUTION) ×8 IMPLANT

## 2013-09-16 NOTE — OR Nursing (Signed)
12:20 - 1st call to SICU charge nurse

## 2013-09-16 NOTE — Procedures (Addendum)
Extubation Procedure Note  Patient Details:   Name: Marc [name] DOB: Apr 28, 1961 MRN: [id number]   Airway Documentation:     Evaluation  O2 sats: stable throughout Complications: No apparent complications Patient did tolerate procedure well.     Yes Pt weaned and extubated per SICU protocol. Pt performed a NIF of -22, a VC of .8, and pt had a positive leak test prior to extubation. A small amount of thick yellow was suctioned from the ETT prior to extubation as well. Vitals remained WNL. Pt was placed on 3L [place]. No complications noted. RT will monitor.   Kristian Covey Arlene 09/16/2013, 6:35 PM

## 2013-09-16 NOTE — Transfer of Care (Signed)
Immediate Anesthesia Transfer of Care Note  Patient: Marc [name]  Procedure(s) Performed: Procedure(s) with comments: CORONARY ARTERY BYPASS GRAFTING (CABG) (N/A) - CABG x four, using left internal mammary artery and right leg greater saphenous vein INTRAOPERATIVE TRANSESOPHAGEAL ECHOCARDIOGRAM (N/A)  Patient Location: SICU  Anesthesia Type:General  Level of Consciousness: Patient remains intubated per anesthesia plan  Airway & Oxygen Therapy: Patient remains intubated per anesthesia plan  Post-op Assessment: Report given to PACU RN  Post vital signs: Reviewed and stable  Complications: No apparent anesthesia complications

## 2013-09-16 NOTE — Progress Notes (Signed)
Patient ID: Marc [name], male   DOB: [birth year], 53 y.o.   MRN: [id number]  SICU Evening Rounds:   Hemodynamically stable  CI = 2.8  Weaning on vent.  Urine output good  CT output low  CBC    Component Value Date/Time   WBC 12.4* 09/16/2013 1300   WBC 7.3 08/21/2013 1144   RBC 3.88* 09/16/2013 1300   RBC 5.20 08/21/2013 1144   HGB 11.6* 09/16/2013 1343   HCT 34.0* 09/16/2013 1343   PLT 145* 09/16/2013 1300   MCV 88.9 09/16/2013 1300   MCH 31.4 09/16/2013 1300   MCH 32.5 08/21/2013 1144   MCHC 35.4 09/16/2013 1300   MCHC 34.3 08/21/2013 1144   RDW 13.1 09/16/2013 1300   RDW 13.1 08/21/2013 1144   LYMPHSABS 1.8 08/21/2013 1144   EOSABS 0.3 08/21/2013 1144   BASOSABS 0.0 08/21/2013 1144     BMET    Component Value Date/Time   NA 138 09/16/2013 1343   NA 136 08/21/2013 1144   K 3.3* 09/16/2013 1343   CL 96 09/12/2013 1553   CO2 22 09/12/2013 1553   GLUCOSE 75 09/16/2013 1343   GLUCOSE 249* 08/21/2013 1144   BUN 21 09/12/2013 1553   BUN 22 08/21/2013 1144   CREATININE 0.84 09/12/2013 1553   CALCIUM 9.7 09/12/2013 1553   GFRNONAA >90 09/12/2013 1553   GFRAA >90 09/12/2013 1553     A/P:  Stable postop course. Continue current plans

## 2013-09-16 NOTE — Interval H&P Note (Signed)
History and Physical Interval Note:  09/16/2013 7:29 AM  Marc [name]  has presented today for surgery, with the diagnosis of CAD  The various methods of treatment have been discussed with the patient and family. After consideration of risks, benefits and other options for treatment, the patient has consented to  Procedure(s): CORONARY ARTERY BYPASS GRAFTING (CABG) (N/A) INTRAOPERATIVE TRANSESOPHAGEAL ECHOCARDIOGRAM (N/A) as a surgical intervention .  The patient's history has been reviewed, patient examined, no change in status, stable for surgery.  I have reviewed the patient's chart and labs.  Questions were answered to the patient's satisfaction.     [name]

## 2013-09-16 NOTE — Progress Notes (Signed)
SICU wean started at this time. RT will monitor.

## 2013-09-16 NOTE — Anesthesia Preprocedure Evaluation (Addendum)
Anesthesia Evaluation  Patient identified by MRN, date of birth, ID band Patient awake    Reviewed: Allergy & Precautions, H&P , NPO status , Patient's Chart, lab work & pertinent test results  History of Anesthesia Complications Negative for: history of anesthetic complications  Airway Mallampati: II TM Distance: >3 FB Neck ROM: Full    Dental  (+) Missing, Dental Advisory Given and Teeth Intact   Pulmonary Current Smoker,  breath sounds clear to auscultation        Cardiovascular hypertension, + CAD and +CHF + dysrhythmias Atrial Fibrillation Rhythm:Irregular Rate:Normal  EF 45%--50%   Neuro/Psych    GI/Hepatic   Endo/Other  diabetes, Poorly Controlled, Type 2  Renal/GU      Musculoskeletal   Abdominal (+) + obese,   Peds  Hematology   Anesthesia Other Findings   Reproductive/Obstetrics                          Anesthesia Physical Anesthesia Plan  ASA: IV  Anesthesia Plan: General   Post-op Pain Management:    Induction: Intravenous  Airway Management Planned: Oral ETT  Additional Equipment: Arterial line, CVP, PA Cath, TEE and Ultrasound Guidance Line Placement  Intra-op Plan:   Post-operative Plan: Post-operative intubation/ventilation  Informed Consent: I have reviewed the patients History and Physical, chart, labs and discussed the procedure including the risks, benefits and alternatives for the proposed anesthesia with the patient or authorized representative who has indicated his/her understanding and acceptance.   Dental advisory given  Plan Discussed with: CRNA, Anesthesiologist and Surgeon  Anesthesia Plan Comments:        Anesthesia Quick Evaluation

## 2013-09-16 NOTE — Preoperative (Signed)
Beta Blockers   Reason not to administer Beta Blockers:coreg 25mg  09/16/2013 am

## 2013-09-16 NOTE — Anesthesia Postprocedure Evaluation (Signed)
  Anesthesia Post-op Note  Patient: Marc [name]  Procedure(s) Performed: Procedure(s) with comments: CORONARY ARTERY BYPASS GRAFTING (CABG) (N/A) - CABG x four, using left internal mammary artery and right leg greater saphenous vein INTRAOPERATIVE TRANSESOPHAGEAL ECHOCARDIOGRAM (N/A)  Patient Location: SICU  Anesthesia Type:General  Level of Consciousness: sedated and Patient remains intubated per anesthesia plan  Airway and Oxygen Therapy: Patient remains intubated per anesthesia plan and Patient placed on Ventilator (see vital sign flow sheet for setting)  Post-op Pain: none  Post-op Assessment: Post-op Vital signs reviewed  Post-op Vital Signs: Reviewed  Complications: No apparent anesthesia complications

## 2013-09-16 NOTE — Brief Op Note (Signed)
09/16/2013  11:44 AM  PATIENT:  Marc [name]  53 y.o. male  PRE-OPERATIVE DIAGNOSIS:  CAD  POST-OPERATIVE DIAGNOSIS:  CAD  PROCEDURE: INTRAOPERATIVE TRANSESOPHAGEAL ECHOCARDIOGRAM, CORONARY ARTERY BYPASS GRAFTING (CABG) x 4 (LIMA to LAD, SVG SEQUENTIALLY TO OM2 and RAMUS INTERMEDIATE, and SVG to OM3) using left internal mammary artery and right thigh and patrial lower leg greater saphenous vein   SURGEON:  Surgeon(s) and Role:    * [name], MD - Primary  PHYSICIAN ASSISTANT: Doree Fudge PA-C  ASSISTANTS: Carleene Overlie RNFA  ANESTHESIA:   general  EBL:  Total I/O In: -  Out: 200 [Urine:200]   DRAINS: Chest tubes in the mediastinal and pleural spaces   COUNTS CORRECT:  YES  DICTATION: .Dragon Dictation  PLAN OF CARE: Admit to inpatient   PATIENT DISPOSITION:  ICU - intubated and hemodynamically stable.   Delay start of Pharmacological VTE agent (>24hrs) due to surgical blood loss or risk of bleeding: yes  PRE OP WEIGHT: 107 kg

## 2013-09-16 NOTE — H&P (Signed)
[address]       [place] [address]             [phone]      Cardiothoracic Surgery History and Physical  PCP is No PCP Per Patient  Referring Provider is Iran Ouch, MD  Chief Complaint   Patient presents with   .  Coronary Artery Disease     eval for CABG...cathed at [facility]   HPI:  He is a 53 year old smoker with diabetes, HTN, and hyperlipidemia who has a known history of coronary disease by catheterization at [facility] in 1998. He reportedly had significant 3-vessel disease with high grade stenosis in a nondominant RCA, 95% OM2, and 50% mid LAD with normal EF. He was treated medically. He recently presented with shortness of breath with mild exertion and was diagnosed with acute systolic heart failure in the setting of uncontrolled hypertension. His troponin was mildly elevated with a BNP of 1900. He developed A-fib with RVR after admission. An echo showed an EF of 45-50% with infero-apical hypokinesis. He improved with medical therapy. On followup he was not taking his medications and arrangements were made for him to get his meds through the [facility] assistance program. He presented again to [facility] with acute on chronic systolic heart failure and was started on Lasix. He continues to have exertional dyspnea, fatigue but no chest pain. Cardiac cath now shows severe 3-vessel coronary disease with 60% distal LM, 80% LAD, 90% stenosis of 3 marginal branches in the dominant LCX, and an occluded nondominant RCA.  Past Medical History   Diagnosis  Date   .  Diabetes mellitus without complication    .  Tobacco abuse    .  Obesity    .  Cardiomyopathy      45-50%   .  CHF (congestive heart failure)    .  Anxiety    .  Medical non-compliance    .  Chronic systolic heart failure  07/2013     Ejection fraction of 45-50% with possible inferior wall hypokinesis   .  Hypertension    .  Hyperlipidemia    .  MI (myocardial infarction)    .  Coronary  artery disease     Past Surgical History   Procedure  Laterality  Date   .  Cardiac catheterization       [facility]. no stent   .  Knee surgery  Right     Family History   Problem  Relation  Age of Onset   .  Heart disease  Mother    Social History  History   Substance Use Topics   .  Smoking status:  Current Every Day Smoker -- 1.00 packs/day for 45 years     Types:  Cigarettes   .  Smokeless tobacco:  Never Used   .  Alcohol Use:  No      Comment: occassional    Current Outpatient Prescriptions   Medication  Sig  Dispense  Refill   .  ALPRAZolam (XANAX) 0.25 MG tablet  Take 0.25 mg by mouth at bedtime as needed for anxiety.     Marland Kitchen  amLODipine (NORVASC) 10 MG tablet  Take 1 tablet (10 mg total) by mouth daily.  30 tablet  6   .  aspirin 325 MG tablet  Take 325 mg by mouth daily.     .  carvedilol (COREG) 25 MG tablet  Take  1 tablet (25 mg total) by mouth 2 (two) times daily.  60 tablet  6   .  furosemide (LASIX) 40 MG tablet  Take 40 mg by mouth daily.     Marland Kitchen.  lisinopril (PRINIVIL,ZESTRIL) 40 MG tablet  Take 1 tablet (40 mg total) by mouth daily.  30 tablet  6   .  metFORMIN (GLUCOPHAGE) 1000 MG tablet  Take 1 tablet (1,000 mg total) by mouth 2 (two) times daily with a meal.  60 tablet  6   .  potassium chloride SA (K-DUR,KLOR-CON) 20 MEQ tablet  Take 20 mEq by mouth daily.      No current facility-administered medications for this visit.   No Known Allergies  Review of Systems  Constitutional: Positive for activity change and fatigue.  HENT: Negative.  Eyes: Negative.  Respiratory: Positive for shortness of breath.  Cardiovascular: Negative for chest pain, palpitations and leg swelling.  No orthopnea or PND  Gastrointestinal: Negative.  Endocrine: Negative.  Genitourinary: Negative.  Musculoskeletal: Negative.  Skin: Negative.  Allergic/Immunologic: Negative.  Neurological: Negative.  Hematological: Negative.  Psychiatric/Behavioral: Negative.  BP 153/92  Pulse 86   Resp 16  Ht 6\' 1"  (1.854 m)  Wt 235 lb (106.595 kg)  BMI 31.01 kg/m2  SpO2 98%  Physical Exam  Constitutional: He is oriented to person, place, and time. He appears well-developed and well-nourished. No distress.  HENT:  Head: Normocephalic and atraumatic.  Mouth/Throat: Oropharynx is clear and moist.  Eyes: EOM are normal. Pupils are equal, round, and reactive to light.  Neck: Normal range of motion. Neck supple. No JVD present. No thyromegaly present.  Cardiovascular: Normal rate, regular rhythm, normal heart sounds and intact distal pulses.  No murmur heard.  Pulmonary/Chest: Effort normal and breath sounds normal. No respiratory distress. He has no wheezes. He has no rales.  Abdominal: Soft. Bowel sounds are normal. He exhibits no distension and no mass. There is no tenderness.  Musculoskeletal: Normal range of motion. He exhibits no edema.  Neurological: He is alert and oriented to person, place, and time. He has normal strength. No cranial nerve deficit or sensory deficit.  Skin: Skin is warm and dry.  Psychiatric: He has a normal mood and affect.   Diagnostic Tests:   Cardiac cath films from [place] reviewed by me in the cloud.   Impression:   He has left main and severe 3- vessel coronary disease with mild LV dysfunction and is having acute on chronic systolic heart failure with recurrent hospitalizations. I agree that CABG is indicated to prevent further ischemia, infarction, and heart failure. This will have to be followed by maximum cardiac risk factor reduction including smoking cessation. I discussed all of this with the patient. I discussed the operative procedure with the patient including alternatives, benefits and risks; including but not limited to bleeding, blood transfusion, infection, stroke, myocardial infarction, graft failure, heart block requiring a permanent pacemaker, organ dysfunction, and death. Carney Harderimothy Studnicka understands and agrees to proceed.    Plan:  CABG

## 2013-09-16 NOTE — OR Nursing (Signed)
12:50 - 2nd call to SICU charge nurse

## 2013-09-17 ENCOUNTER — Inpatient Hospital Stay (HOSPITAL_COMMUNITY): Payer: Medicaid Other

## 2013-09-17 LAB — POCT I-STAT, CHEM 8
BUN: 9 mg/dL (ref 6–23)
CALCIUM ION: 1.27 mmol/L — AB (ref 1.12–1.23)
CREATININE: 0.9 mg/dL (ref 0.50–1.35)
Chloride: 95 mEq/L — ABNORMAL LOW (ref 96–112)
GLUCOSE: 144 mg/dL — AB (ref 70–99)
HCT: 41 % (ref 39.0–52.0)
Hemoglobin: 13.9 g/dL (ref 13.0–17.0)
Potassium: 4.2 mEq/L (ref 3.7–5.3)
Sodium: 133 mEq/L — ABNORMAL LOW (ref 137–147)
TCO2: 26 mmol/L (ref 0–100)

## 2013-09-17 LAB — CREATININE, SERUM
CREATININE: 0.82 mg/dL (ref 0.50–1.35)
GFR calc Af Amer: >90 mL/min (ref >90)
GFR calc non Af Amer: >90 mL/min (ref >90)

## 2013-09-17 LAB — GLUCOSE, CAPILLARY
GLUCOSE-CAPILLARY: 101 mg/dL — AB (ref 70–99)
GLUCOSE-CAPILLARY: 109 mg/dL — AB (ref 70–99)
GLUCOSE-CAPILLARY: 111 mg/dL — AB (ref 70–99)
GLUCOSE-CAPILLARY: 187 mg/dL — AB (ref 70–99)
GLUCOSE-CAPILLARY: 211 mg/dL — AB (ref 70–99)
GLUCOSE-CAPILLARY: 97 mg/dL (ref 70–99)
GLUCOSE-CAPILLARY: 99 mg/dL (ref 70–99)
Glucose-Capillary: 100 mg/dL — ABNORMAL HIGH (ref 70–99)
Glucose-Capillary: 101 mg/dL — ABNORMAL HIGH (ref 70–99)
Glucose-Capillary: 111 mg/dL — ABNORMAL HIGH (ref 70–99)
Glucose-Capillary: 97 mg/dL (ref 70–99)
Glucose-Capillary: 106 mg/dL — ABNORMAL HIGH (ref 70–99)
Glucose-Capillary: 113 mg/dL — ABNORMAL HIGH (ref 70–99)
Glucose-Capillary: 119 mg/dL — ABNORMAL HIGH (ref 70–99)
Glucose-Capillary: 121 mg/dL — ABNORMAL HIGH (ref 70–99)
Glucose-Capillary: 140 mg/dL — ABNORMAL HIGH (ref 70–99)

## 2013-09-17 LAB — CBC
HCT: 35.6 % — ABNORMAL LOW (ref 39.0–52.0)
HCT: 38.8 % — ABNORMAL LOW (ref 39.0–52.0)
Hemoglobin: 12.5 g/dL — ABNORMAL LOW (ref 13.0–17.0)
Hemoglobin: 13.5 g/dL (ref 13.0–17.0)
MCH: 31.6 pg (ref 26.0–34.0)
MCH: 31.8 pg (ref 26.0–34.0)
MCHC: 35.1 g/dL (ref 30.0–36.0)
MCHC: 34.8 g/dL (ref 30.0–36.0)
MCV: 89.9 fL (ref 78.0–100.0)
MCV: 91.5 fL (ref 78.0–100.0)
Platelets: 159 10*3/uL (ref 150–400)
Platelets: 169 10*3/uL (ref 150–400)
RBC: 3.96 MIL/uL — ABNORMAL LOW (ref 4.22–5.81)
RBC: 4.24 MIL/uL (ref 4.22–5.81)
RDW: 13.4 % (ref 11.5–15.5)
RDW: 13.7 % (ref 11.5–15.5)
WBC: 12 10*3/uL — ABNORMAL HIGH (ref 4.0–10.5)
WBC: 15.9 10*3/uL — AB (ref 4.0–10.5)

## 2013-09-17 LAB — BASIC METABOLIC PANEL
BUN: 11 mg/dL (ref 6–23)
CHLORIDE: 101 meq/L (ref 96–112)
CO2: 22 mEq/L (ref 19–32)
Calcium: 8.3 mg/dL — ABNORMAL LOW (ref 8.4–10.5)
Creatinine, Ser: 0.81 mg/dL (ref 0.50–1.35)
GLUCOSE: 101 mg/dL — AB (ref 70–99)
Potassium: 4.1 mEq/L (ref 3.7–5.3)
SODIUM: 134 meq/L — AB (ref 137–147)

## 2013-09-17 LAB — MAGNESIUM
Magnesium: 2.2 mg/dL (ref 1.5–2.5)
Magnesium: 1.9 mg/dL (ref 1.5–2.5)

## 2013-09-17 MED ORDER — ALPRAZOLAM 0.25 MG PO TABS
0.2500 mg | ORAL_TABLET | Freq: Every evening | ORAL | Status: DC | PRN
  Administered 2013-09-17 – 2013-09-18 (×2): 0.25 mg via ORAL
  Filled 2013-09-17 (×2): qty 1

## 2013-09-17 MED ORDER — GUAIFENESIN ER 600 MG PO TB12
600.0000 mg | ORAL_TABLET | Freq: Two times a day (BID) | ORAL | Status: DC
  Administered 2013-09-17 – 2013-09-24 (×14): 600 mg via ORAL
  Filled 2013-09-17 (×15): qty 1

## 2013-09-17 MED ORDER — INSULIN DETEMIR 100 UNIT/ML ~~LOC~~ SOLN
8.0000 [IU] | Freq: Once | SUBCUTANEOUS | Status: AC
  Administered 2013-09-17: 8 [IU] via SUBCUTANEOUS
  Filled 2013-09-17: qty 0.08

## 2013-09-17 MED ORDER — INSULIN DETEMIR 100 UNIT/ML ~~LOC~~ SOLN
8.0000 [IU] | Freq: Every day | SUBCUTANEOUS | Status: DC
  Administered 2013-09-18: 8 [IU] via SUBCUTANEOUS
  Filled 2013-09-17: qty 0.08

## 2013-09-17 MED ORDER — INSULIN ASPART 100 UNIT/ML ~~LOC~~ SOLN
0.0000 [IU] | SUBCUTANEOUS | Status: DC
  Administered 2013-09-17 (×2): 2 [IU] via SUBCUTANEOUS

## 2013-09-17 MED ORDER — FUROSEMIDE 10 MG/ML IJ SOLN
20.0000 mg | Freq: Two times a day (BID) | INTRAMUSCULAR | Status: AC
  Administered 2013-09-17 (×2): 20 mg via INTRAVENOUS

## 2013-09-17 MED ORDER — POTASSIUM CHLORIDE CRYS ER 20 MEQ PO TBCR
20.0000 meq | EXTENDED_RELEASE_TABLET | Freq: Once | ORAL | Status: AC
  Administered 2013-09-17: 20 meq via ORAL
  Filled 2013-09-17: qty 1

## 2013-09-17 MED FILL — Lidocaine HCl IV Inj 20 MG/ML: INTRAVENOUS | Qty: 5 | Status: AC

## 2013-09-17 MED FILL — Electrolyte-R (PH 7.4) Solution: INTRAVENOUS | Qty: 3000 | Status: AC

## 2013-09-17 MED FILL — Heparin Sodium (Porcine) Inj 1000 Unit/ML: INTRAMUSCULAR | Qty: 20 | Status: AC

## 2013-09-17 MED FILL — Sodium Bicarbonate IV Soln 8.4%: INTRAVENOUS | Qty: 50 | Status: AC

## 2013-09-17 MED FILL — Mannitol IV Soln 20%: INTRAVENOUS | Qty: 500 | Status: AC

## 2013-09-17 MED FILL — Sodium Chloride IV Soln 0.9%: INTRAVENOUS | Qty: 2000 | Status: AC

## 2013-09-17 NOTE — Progress Notes (Signed)
Patient ID: Marc [name], male   DOB: [birth year], 53 y.o.   MRN: [id number] EVENING ROUNDS NOTE :     [address]       [place] [address]             [phone]                 1 Day Post-Op Procedure(s) (LRB): CORONARY ARTERY BYPASS GRAFTING (CABG) (N/A) INTRAOPERATIVE TRANSESOPHAGEAL ECHOCARDIOGRAM (N/A)  Total Length of Stay:  LOS: 1 day  BP 153/94  Pulse 87  Temp(Src) 98.3 F (36.8 C) (Oral)  Resp 20  Wt 252 lb 13.9 oz (114.7 kg)  SpO2 92%  .Intake/Output     01/13 0701 - 01/14 0700 01/14 0701 - 01/15 0700   P.O. 960 1600   I.V. (mL/kg) 4141.4 (36.1) 262.8 (2.3)   Blood 250    IV Piggyback 700 50   Total Intake(mL/kg) 6051.4 (52.8) 1912.8 (16.7)   Urine (mL/kg/hr) 2205 (0.8) 1235 (0.9)   Blood 2367 (0.9)    Chest Tube 585 (0.2) 20 (0)   Total Output 5157 1255   Net +894.4 +657.8          . sodium chloride 20 mL/hr at 09/17/13 0700  . sodium chloride 20 mL/hr at 09/17/13 0600  . sodium chloride 250 mL (09/17/13 0637)  . dexmedetomidine 0.1 mcg/kg/hr (09/17/13 0247)  . insulin (NOVOLIN-R) infusion Stopped (09/17/13 1000)  . lactated ringers 20 mL/hr at 09/17/13 0700  . nitroGLYCERIN Stopped (09/16/13 1330)  . phenylephrine (NEO-SYNEPHRINE) Adult infusion Stopped (09/16/13 1600)     Lab Results  Component Value Date   WBC 15.9* 09/17/2013   HGB 13.9 09/17/2013   HCT 41.0 09/17/2013   PLT 169 09/17/2013   GLUCOSE 144* 09/17/2013   ALT 13 09/12/2013   AST 11 09/12/2013   NA 133* 09/17/2013   K 4.2 09/17/2013   CL 95* 09/17/2013   CREATININE 0.90 09/17/2013   BUN 9 09/17/2013   CO2 22 09/17/2013   INR 1.26 09/16/2013   HGBA1C 10.8* 09/12/2013   Hard to control pain issues Wants nicotine patch, but thinks can do without when explained vasocontrictor  Delight Ovens MD  Beeper [phone] Office [phone] 09/17/2013 6:41 PM

## 2013-09-17 NOTE — Progress Notes (Addendum)
TCTS DAILY ICU PROGRESS NOTE                   [address]            [place] [id number]          [phone]   1 Day Post-Op Procedure(s) (LRB): CORONARY ARTERY BYPASS GRAFTING (CABG) (N/A) INTRAOPERATIVE TRANSESOPHAGEAL ECHOCARDIOGRAM (N/A)  Total Length of Stay:  LOS: 1 day   Subjective: Patient with cough and some incisional pain.  Objective: Vital signs in last 24 hours: Temp:  [96.1 F (35.6 C)-100 F (37.8 C)] 100 F (37.8 C) (01/14 0600) Pulse Rate:  [74-81] 81 (01/14 0600) Cardiac Rhythm:  [-] Atrial paced (01/14 0600) Resp:  [12-23] 23 (01/14 0600) BP: (96-116)/(60-82) 116/74 mmHg (01/14 0600) SpO2:  [92 %-100 %] 94 % (01/14 0600) FiO2 (%):  [40 %-100 %] 40 % (01/13 1756) Weight:  [114.7 kg (252 lb 13.9 oz)] 114.7 kg (252 lb 13.9 oz) (01/14 0500)  Filed Weights   09/15/13 1300 09/17/13 0500  Weight: 107.049 kg (236 lb) 114.7 kg (252 lb 13.9 oz)    Weight change: 7.651 kg (16 lb 13.9 oz)   Hemodynamic parameters for last 24 hours: PAP: (25-54)/(8-27) 25/8 mmHg CO:  [6.4 L/min-7.4 L/min] 6.7 L/min CI:  [2.8 L/min/m2-3.2 L/min/m2] 2.9 L/min/m2  Intake/Output from previous day: 01/13 0701 - 01/14 0700 In: 5960.5 [P.O.:960; I.V.:4100.5; Blood:250; IV Piggyback:650] Out: [id number] [Urine:2205; Blood:2367; Chest Tube:585]     Current Meds: Scheduled Meds: . acetaminophen  1,000 mg Oral Q6H   Or  . acetaminophen (TYLENOL) oral liquid 160 mg/5 mL  1,000 mg Per Tube Q6H  . aspirin EC  325 mg Oral Daily   Or  . aspirin  324 mg Per Tube Daily  . bisacodyl  10 mg Oral Daily   Or  . bisacodyl  10 mg Rectal Daily  . cefUROXime (ZINACEF)  IV  1.5 g Intravenous Q12H  . docusate sodium  200 mg Oral Daily  . famotidine (PEPCID) IV  20 mg Intravenous Q12H  . insulin regular  0-10 Units Intravenous TID WC  . metoprolol tartrate  12.5 mg Oral BID   Or  . metoprolol tartrate  12.5 mg Per Tube BID  . multivitamin with minerals  1 tablet Oral Daily    . [START ON 09/18/2013] pantoprazole  40 mg Oral Daily  . simvastatin  20 mg Oral q1800  . sodium chloride  3 mL Intravenous Q12H   Continuous Infusions: . sodium chloride 20 mL/hr at 09/17/13 0600  . sodium chloride 20 mL/hr at 09/17/13 0600  . sodium chloride 250 mL (09/17/13 0637)  . dexmedetomidine 0.1 mcg/kg/hr (09/17/13 0247)  . insulin (NOVOLIN-R) infusion 0.9 Units/hr (09/17/13 0600)  . lactated ringers 20 mL/hr at 09/17/13 0600  . nitroGLYCERIN Stopped (09/16/13 1330)  . phenylephrine (NEO-SYNEPHRINE) Adult infusion Stopped (09/16/13 1600)   PRN Meds:.albumin human, metoprolol, midazolam, morphine injection, ondansetron (ZOFRAN) IV, oxyCODONE, sodium chloride  General appearance: alert, cooperative and no distress Neurologic: intact Heart: regular rate and rhythm Lungs: Diminshed at bases;no rales, wheezes, or rhonchi Abdomen: soft, non-tender; bowel sounds normal; no masses,  no organomegaly Extremities: SCD on the LLE;RLE dressing is clean and dry Wound: Sternal dressing mostly clean and dry  Lab Results: CBC: Recent Labs  09/16/13 1930 09/16/13 1938 09/17/13 0420  WBC 14.8*  --  12.0*  HGB 13.2 12.6* 12.5*  HCT 37.4* 37.0* 35.6*  PLT 167  --  159  BMET:  Recent Labs  09/16/13 1938 09/17/13 0420  NA 134* 134*  K 4.1 4.1  CL 103 101  CO2  --  22  GLUCOSE 132* 101*  BUN 12 11  CREATININE 0.80 0.81  CALCIUM  --  8.3*    PT/INR:  Recent Labs  09/16/13 1300  LABPROT 15.5*  INR 1.26   Radiology: Dg Chest Portable 1 View In Am  09/17/2013   CLINICAL DATA:  CABG.  EXAM: PORTABLE CHEST - 1 VIEW  COMPARISON:  09/16/2013.  FINDINGS: Interim removal of NG tube. Swan-Ganz catheter mediastinum drainage catheter in good anatomic position. Cardiomegaly with mild pulmonary vascular prominence and interstitial prominence suggesting mild congestive heart failure. Prior CABG. No pneumothorax or acute osseous abnormality.  IMPRESSION: 1. Interim removal of NG tube.  Swan-Ganz catheter and mediastinal drainage catheter in stable position. 2. Prior CABG. Mild congestive heart failure with interstitial mild interstitial edema.   Electronically Signed   By: Maisie Fus  Register   On: 09/17/2013 07:15   Dg Chest Portable 1 View  09/16/2013   CLINICAL DATA:  Status post cardiac surgery.  NG tube placement.  EXAM: PORTABLE CHEST - 1 VIEW  COMPARISON:  PA and lateral chest 09/12/2013.  FINDINGS: The patient's NG tube is in the distal right mainstem bronchus. ET tube is in good position with the tip at the level of the clavicular heads. Mediastinal drain is identified. Right IJ approach Swan-Ganz catheter is in place. The tip of the catheter is in the proximal right main pulmonary artery. Mild atelectasis is seen in the right base. There is cardiomegaly without edema. No pneumothorax.  IMPRESSION: NG tube tip is in the distal right mainstem bronchus. Support apparatus is otherwise in good position.  Cardiomegaly without edema.  Findings were called to the patient's nurse, Nicole Cella, in the medical intensive care unit at the time interpretation.   Electronically Signed   By: Drusilla Kanner M.D.   On: 09/16/2013 14:09     Assessment/Plan: S/P Procedure(s) (LRB): CORONARY ARTERY BYPASS GRAFTING (CABG) (N/A) INTRAOPERATIVE TRANSESOPHAGEAL ECHOCARDIOGRAM (N/A)  1.CV-EKG SR and no changed from pre op.SR., some PVCs. On Lopressor 12.5 bid. 2.Pulmonary-Chest tubes with 585 cc of output since surgery. CXR this am shows no pneumothorax, mild heart failure. Encourage incentive spirometer and flutter valve. 3.Volume Overload-Begin gentle diuresis. Foley to remain today for UO monitoring 4.ABL anemia-H and H stable at 12.5 and 35.6 5.DM-CBGs 111/100/97. Start low dose Levemir to keep off insulin drip. Will restart Metformin when po intake adequate. Pre op HGA1C 10.8. Will need follow up as an outpatient. 6.Please see progression orders.   Ardelle Balls PA-C 09/17/2013 7:38  AM  Chart reviewed, patient examined, agree with above.

## 2013-09-17 NOTE — Op Note (Signed)
CARDIOVASCULAR SURGERY OPERATIVE NOTE  09/17/2013  Surgeon:  Alleen BorneBryan K. Philipp Callegari, MD  First Assistant: Doree Fudgeonielle Zimmerman, [facility]   Preoperative Diagnosis:  Severe multi-vessel coronary artery disease   Postoperative Diagnosis:  Same   Procedure:  1. Median Sternotomy 2. Extracorporeal circulation 3.   Coronary artery bypass grafting x 4   Left internal mammary graft to the LAD  Sequential SVG to Ramus and OM2  SVG to OM3  4.   Endoscopic vein harvest from the right leg   Anesthesia:  General Endotracheal   Clinical History/Surgical Indication:  He is a 53 year old smoker with diabetes, HTN, and hyperlipidemia who has a known history of coronary disease by catheter[facility]. He reportedly had significant 3-vessel disease with high grade stenosis in a nondominant RCA, 95% OM2, and 50% mid LAD with normal EF. He was treated medically. He recently presented with shortness of breath with mild exertion and was diagnosed with acute systolic heart failure in the setting of uncontrolled hypertension. His troponin was mildly elevated with a BNP of 1900. He developed A-fib with RVR after admission. An echo showed an EF of 45-50% with infero-apical hypokinesis. He improved with medical therapy. On followup he was not taking his medications and arrangements were made for him to get his meds through the [place] assistance program. He presented again to [facility] with acute on chronic systolic heart failure and was started on Lasix. He continues to have exertional dyspnea, fatigue but no chest pain. Cardiac cath now shows severe 3-vessel coronary disease with 60% distal LM, 80% LAD, 90% stenosis of 3 marginal branches in the dominant LCX, and an occluded nondominant RCA. He has left main and severe 3- vessel coronary disease with mild LV dysfunction and is having acute on chronic systolic  heart failure with recurrent hospitalizations. I agree that CABG is indicated to prevent further ischemia, infarction, and heart failure. This will have to be followed by maximum cardiac risk factor reduction including smoking cessation. I discussed all of this with the patient. I discussed the operative procedure with the patient including alternatives, benefits and risks; including but not limited to bleeding, blood transfusion, infection, stroke, myocardial infarction, graft failure, heart block requiring a permanent pacemaker, organ dysfunction, and death. Marc [name] understands and agrees to proceed.    Preparation:  The patient was seen in the preoperative holding area and the correct patient, correct operation were confirmed with the patient after reviewing the medical record and catheterization. The consent was signed by me. Preoperative antibiotics were given. A pulmonary arterial line and radial arterial line were placed by the anesthesia team. The patient was taken back to the operating room and positioned supine on the operating room table. After being placed under general endotracheal anesthesia by the anesthesia team a foley catheter was placed. The neck, chest, abdomen, and both legs were prepped with betadine soap and solution and draped in the usual sterile manner. A surgical time-out was taken and the correct patient and operative procedure were confirmed with the nursing and anesthesia staff.   Cardiopulmonary Bypass:  A median sternotomy was performed. The pericardium was opened in the midline. Right ventricular function appeared normal. The ascending aorta was of normal size and had no palpable plaque. There were no contraindications to aortic cannulation or cross-clamping. The patient was fully systemically heparinized and the ACT was maintained > 400 sec. The proximal aortic arch was cannulated with a 20 F aortic cannula for arterial inflow. Venous cannulation was  performed via  the right atrial appendage using a two-staged venous cannula. An antegrade cardioplegia/vent cannula was inserted into the mid-ascending aorta. Aortic occlusion was performed with a single cross-clamp. Systemic cooling to 32 degrees Centigrade and topical cooling of the heart with iced saline were used. Hyperkalemic antegrade cold blood cardioplegia was used to induce diastolic arrest and was then given at about 20 minute intervals throughout the period of arrest to maintain myocardial temperature at or below 10 degrees centigrade. A temperature probe was inserted into the interventricular septum and an insulating pad was placed in the pericardium.   Left internal mammary harvest:  The left side of the sternum was retracted using the Rultract retractor. The left internal mammary artery was harvested as a pedicle graft. All side branches were clipped. It was a medium-sized vessel of good quality with excellent blood flow. It was ligated distally and divided. It was sprayed with topical papaverine solution to prevent vasospasm.   Endoscopic vein harvest:  The right greater saphenous vein was harvested endoscopically through a 2 cm incision medial to the right knee. It was harvested from the upper thigh to below the knee. It was a medium-sized vein of good quality. The side branches were all ligated with 4-0 silk ties.    Coronary arteries:  The coronary arteries were examined.   LAD:  Severely and diffusely diseased with calcific plaque.   LCX:  Ramus was severely diseased proximally then became intramyocardial. It was followed into the muscle where it was graftable. The OM1 was small and not graftable. The OM2 was moderate-sized, diffusely diseased but graftable. The OM3 was moderate-sized, diffusely diseased but graftable. The distal LCX terminated as very small branches.  RCA:  Small, nondominant and severely diseased   Grafts:  1. LIMA to the LAD: 1.75 mm. It was sewn end to side using  8-0 prolene continuous suture. 2. SVG to OM3:  1.6 mm. It was sewn end to side using 7-0 prolene continuous suture. 3. Seq SVG to Ramus:  1.6 mm. It was sewn side to side using 7-0 prolene continuous suture. 4. Seq SVG to OM2:  1.6 mm. It was sewn end to side using 7-0 prolene continuous suture.  The proximal vein graft anastomoses were performed to the mid-ascending aorta using continuous 6-0 prolene suture. Graft markers were placed around the proximal anastomoses.   Completion:  The patient was rewarmed to 37 degrees Centigrade. The clamp was removed from the LIMA pedicle and there was rapid warming of the septum and return of ventricular fibrillation. There was spontaneous return of sinus rhythm. The distal and proximal anastomoses were checked for hemostasis. The position of the grafts was satisfactory. Two temporary epicardial pacing wires were placed on the right atrium and two on the right ventricle. The patient was weaned from CPB without difficulty on no inotropes. Cardiac output was 8 LPM. Heparin was fully reversed with protamine and the aortic and venous cannulas removed. Hemostasis was achieved. Mediastinal and left pleural drainage tubes were placed. The sternum was closed with double #6 stainless steel wires. The fascia was closed with continuous # 1 vicryl suture. The subcutaneous tissue was closed with 2-0 vicryl continuous suture. The skin was closed with 3-0 vicryl subcuticular suture. All sponge, needle, and instrument counts were reported correct at the end of the case. Dry sterile dressings were placed over the incisions and around the chest tubes which were connected to pleurevac suction. The patient was then transported to the surgical intensive care unit  in critical but stable condition.

## 2013-09-18 ENCOUNTER — Encounter (HOSPITAL_COMMUNITY): Payer: Self-pay | Admitting: Surgery

## 2013-09-18 ENCOUNTER — Inpatient Hospital Stay (HOSPITAL_COMMUNITY): Payer: Medicaid Other

## 2013-09-18 LAB — CBC
HCT: 37 % — ABNORMAL LOW (ref 39.0–52.0)
Hemoglobin: 13 g/dL (ref 13.0–17.0)
MCH: 32 pg (ref 26.0–34.0)
MCHC: 35.1 g/dL (ref 30.0–36.0)
MCV: 91.1 fL (ref 78.0–100.0)
PLATELETS: 153 10*3/uL (ref 150–400)
RBC: 4.06 MIL/uL — AB (ref 4.22–5.81)
RDW: 13.5 % (ref 11.5–15.5)
WBC: 14.2 10*3/uL — ABNORMAL HIGH (ref 4.0–10.5)

## 2013-09-18 LAB — GLUCOSE, CAPILLARY
GLUCOSE-CAPILLARY: 109 mg/dL — AB (ref 70–99)
GLUCOSE-CAPILLARY: 144 mg/dL — AB (ref 70–99)
GLUCOSE-CAPILLARY: 166 mg/dL — AB (ref 70–99)
GLUCOSE-CAPILLARY: 237 mg/dL — AB (ref 70–99)
Glucose-Capillary: 128 mg/dL — ABNORMAL HIGH (ref 70–99)
Glucose-Capillary: 130 mg/dL — ABNORMAL HIGH (ref 70–99)
Glucose-Capillary: 117 mg/dL — ABNORMAL HIGH (ref 70–99)
Glucose-Capillary: 85 mg/dL (ref 70–99)
Glucose-Capillary: 232 mg/dL — ABNORMAL HIGH (ref 70–99)
Glucose-Capillary: 222 mg/dL — ABNORMAL HIGH (ref 70–99)

## 2013-09-18 LAB — BASIC METABOLIC PANEL
BUN: 9 mg/dL (ref 6–23)
CO2: 28 mEq/L (ref 19–32)
CREATININE: 0.73 mg/dL (ref 0.50–1.35)
Calcium: 8.9 mg/dL (ref 8.4–10.5)
Chloride: 97 mEq/L (ref 96–112)
GFR calc Af Amer: >90 mL/min (ref >90)
GFR calc non Af Amer: >90 mL/min (ref >90)
Glucose, Bld: 113 mg/dL — ABNORMAL HIGH (ref 70–99)
Potassium: 4.1 mEq/L (ref 3.7–5.3)
SODIUM: 136 meq/L — AB (ref 137–147)

## 2013-09-18 MED ORDER — OXYCODONE HCL 5 MG PO TABS
5.0000 mg | ORAL_TABLET | ORAL | Status: DC | PRN
  Administered 2013-09-18 – 2013-09-22 (×14): 10 mg via ORAL
  Administered 2013-09-22: 5 mg via ORAL
  Administered 2013-09-22: 10 mg via ORAL
  Administered 2013-09-22: 5 mg via ORAL
  Administered 2013-09-23 – 2013-09-24 (×5): 10 mg via ORAL
  Filled 2013-09-18 (×17): qty 2
  Filled 2013-09-18: qty 1
  Filled 2013-09-18 (×4): qty 2

## 2013-09-18 MED ORDER — METFORMIN HCL 500 MG PO TABS
1000.0000 mg | ORAL_TABLET | Freq: Two times a day (BID) | ORAL | Status: DC
  Administered 2013-09-18 – 2013-09-24 (×13): 1000 mg via ORAL
  Filled 2013-09-18 (×15): qty 2

## 2013-09-18 MED ORDER — ONDANSETRON HCL 4 MG PO TABS: 4.0000 mg | ORAL_TABLET | Freq: Four times a day (QID) | ORAL | Status: DC | PRN

## 2013-09-18 MED ORDER — MOVING RIGHT ALONG BOOK
Freq: Once | Status: AC
  Administered 2013-09-18: 09:00:00
  Filled 2013-09-18: qty 1

## 2013-09-18 MED ORDER — METOPROLOL TARTRATE 25 MG PO TABS
25.0000 mg | ORAL_TABLET | Freq: Two times a day (BID) | ORAL | Status: DC
  Administered 2013-09-18 – 2013-09-20 (×6): 25 mg via ORAL
  Filled 2013-09-18 (×8): qty 1

## 2013-09-18 MED ORDER — BISACODYL 10 MG RE SUPP: 10.0000 mg | Freq: Every day | RECTAL | Status: DC | PRN

## 2013-09-18 MED ORDER — ACETAMINOPHEN 325 MG PO TABS
650.0000 mg | ORAL_TABLET | Freq: Four times a day (QID) | ORAL | Status: DC | PRN
  Administered 2013-09-19 – 2013-09-20 (×2): 650 mg via ORAL
  Filled 2013-09-18 (×2): qty 2

## 2013-09-18 MED ORDER — ALPRAZOLAM 0.25 MG PO TABS
0.2500 mg | ORAL_TABLET | Freq: Three times a day (TID) | ORAL | Status: DC | PRN
  Administered 2013-09-18 – 2013-09-23 (×12): 0.25 mg via ORAL
  Filled 2013-09-18 (×11): qty 1

## 2013-09-18 MED ORDER — ONDANSETRON HCL 4 MG/2ML IJ SOLN: 4.0000 mg | Freq: Four times a day (QID) | INTRAMUSCULAR | Status: DC | PRN

## 2013-09-18 MED ORDER — INSULIN ASPART 100 UNIT/ML ~~LOC~~ SOLN
0.0000 [IU] | Freq: Three times a day (TID) | SUBCUTANEOUS | Status: DC
  Administered 2013-09-18 (×3): 8 [IU] via SUBCUTANEOUS
  Administered 2013-09-19 (×2): 2 [IU] via SUBCUTANEOUS
  Administered 2013-09-19: 4 [IU] via SUBCUTANEOUS
  Administered 2013-09-19 – 2013-09-20 (×3): 2 [IU] via SUBCUTANEOUS
  Administered 2013-09-20: 4 [IU] via SUBCUTANEOUS
  Administered 2013-09-21: 2 [IU] via SUBCUTANEOUS
  Administered 2013-09-21: 4 [IU] via SUBCUTANEOUS
  Administered 2013-09-21: 2 [IU] via SUBCUTANEOUS
  Administered 2013-09-21: 4 [IU] via SUBCUTANEOUS
  Administered 2013-09-22: 2 [IU] via SUBCUTANEOUS
  Administered 2013-09-23: 4 [IU] via SUBCUTANEOUS
  Administered 2013-09-23 – 2013-09-24 (×2): 2 [IU] via SUBCUTANEOUS

## 2013-09-18 MED ORDER — FUROSEMIDE 40 MG PO TABS
40.0000 mg | ORAL_TABLET | Freq: Every day | ORAL | Status: AC
  Administered 2013-09-18 – 2013-09-21 (×4): 40 mg via ORAL
  Filled 2013-09-18 (×4): qty 1

## 2013-09-18 MED ORDER — SODIUM CHLORIDE 0.9 % IJ SOLN
3.0000 mL | INTRAMUSCULAR | Status: DC | PRN
Start: 2013-09-18 — End: 2013-09-24

## 2013-09-18 MED ORDER — SODIUM CHLORIDE 0.9 % IV SOLN: 250.0000 mL | INTRAVENOUS | Status: DC | PRN

## 2013-09-18 MED ORDER — FAMOTIDINE 20 MG PO TABS
20.0000 mg | ORAL_TABLET | Freq: Two times a day (BID) | ORAL | Status: DC
  Administered 2013-09-18 – 2013-09-24 (×13): 20 mg via ORAL
  Filled 2013-09-18 (×14): qty 1

## 2013-09-18 MED ORDER — SODIUM CHLORIDE 0.9 % IJ SOLN
3.0000 mL | Freq: Two times a day (BID) | INTRAMUSCULAR | Status: DC
  Administered 2013-09-18 – 2013-09-24 (×11): 3 mL via INTRAVENOUS

## 2013-09-18 MED ORDER — BISACODYL 5 MG PO TBEC
10.0000 mg | DELAYED_RELEASE_TABLET | Freq: Every day | ORAL | Status: DC | PRN
  Administered 2013-09-23: 10 mg via ORAL
  Filled 2013-09-18: qty 2

## 2013-09-18 MED ORDER — TRAMADOL HCL 50 MG PO TABS
50.0000 mg | ORAL_TABLET | ORAL | Status: DC | PRN
  Administered 2013-09-19 – 2013-09-21 (×2): 100 mg via ORAL
  Filled 2013-09-18 (×3): qty 2

## 2013-09-18 MED ORDER — KETOROLAC TROMETHAMINE 15 MG/ML IJ SOLN
15.0000 mg | Freq: Four times a day (QID) | INTRAMUSCULAR | Status: AC | PRN
  Administered 2013-09-18 – 2013-09-20 (×3): 15 mg via INTRAVENOUS
  Filled 2013-09-18 (×3): qty 1

## 2013-09-18 MED ORDER — DOCUSATE SODIUM 100 MG PO CAPS
200.0000 mg | ORAL_CAPSULE | Freq: Every day | ORAL | Status: DC
  Administered 2013-09-18 – 2013-09-24 (×7): 200 mg via ORAL
  Filled 2013-09-18 (×7): qty 2

## 2013-09-18 MED ORDER — POTASSIUM CHLORIDE CRYS ER 20 MEQ PO TBCR
40.0000 meq | EXTENDED_RELEASE_TABLET | Freq: Every day | ORAL | Status: AC
  Administered 2013-09-18 – 2013-09-21 (×4): 40 meq via ORAL
  Filled 2013-09-18 (×4): qty 2

## 2013-09-18 MED ORDER — ASPIRIN EC 325 MG PO TBEC
325.0000 mg | DELAYED_RELEASE_TABLET | Freq: Every day | ORAL | Status: DC
  Administered 2013-09-18 – 2013-09-21 (×4): 325 mg via ORAL
  Filled 2013-09-18 (×4): qty 1

## 2013-09-18 MED ORDER — INSULIN DETEMIR 100 UNIT/ML ~~LOC~~ SOLN
12.0000 [IU] | Freq: Two times a day (BID) | SUBCUTANEOUS | Status: DC
  Administered 2013-09-18 – 2013-09-19 (×3): 12 [IU] via SUBCUTANEOUS
  Filled 2013-09-18 (×5): qty 0.12

## 2013-09-18 MED FILL — Heparin Sodium (Porcine) Inj 1000 Unit/ML: INTRAMUSCULAR | Qty: 30 | Status: AC

## 2013-09-18 MED FILL — Potassium Chloride Inj 2 mEq/ML: INTRAVENOUS | Qty: 40 | Status: AC

## 2013-09-18 MED FILL — Magnesium Sulfate Inj 50%: INTRAMUSCULAR | Qty: 10 | Status: AC

## 2013-09-18 MED FILL — Dexmedetomidine HCl IV Soln 200 MCG/2ML: INTRAVENOUS | Qty: 2 | Status: AC

## 2013-09-18 NOTE — Progress Notes (Signed)
Pt found on two different occasion out of bed. Pt has been instructed all day about sternal precautions and not getting up alone, that he needs to press the call bed for assistance.  Pt was re-educated at this time on using the call bell and sternal precautions. Bed alarm was place on the bed. Will cont to monitor and assess pt

## 2013-09-18 NOTE — Progress Notes (Signed)
2 Days Post-Op Procedure(s) (LRB): CORONARY ARTERY BYPASS GRAFTING (CABG) (N/A) INTRAOPERATIVE TRANSESOPHAGEAL ECHOCARDIOGRAM (N/A) Subjective:  No complaints  Objective: Vital signs in last 24 hours: Temp:  [98.2 F (36.8 C)-99.1 F (37.3 C)] 98.3 F (36.8 C) (01/15 0400) Pulse Rate:  [71-94] 80 (01/15 0700) Cardiac Rhythm:  [-] Normal sinus rhythm (01/15 0400) Resp:  [11-25] 15 (01/15 0700) BP: (98-154)/(71-97) 114/77 mmHg (01/15 0700) SpO2:  [88 %-100 %] 97 % (01/15 0700) Weight:  [112.6 kg (248 lb 3.8 oz)] 112.6 kg (248 lb 3.8 oz) (01/15 0500)  Hemodynamic parameters for last 24 hours: PAP: (31)/(14-16) 31/16 mmHg  Intake/Output from previous day: 01/14 0701 - 01/15 0700 In: 2151.4 [P.O.:1600; I.V.:501.4; IV Piggyback:50] Out: 3130 [Urine:3110; Chest Tube:20] Intake/Output this shift:    General appearance: alert and cooperative Neurologic: intact Heart: regular rate and rhythm, S1, S2 normal, no murmur, click, rub or gallop Lungs: clear to auscultation bilaterally Extremities: extremities normal, atraumatic, no cyanosis or edema Wound: dressing dry  Lab Results:  Recent Labs  09/17/13 1700 09/17/13 1705 09/18/13 0345  WBC 15.9*  --  14.2*  HGB 13.5 13.9 13.0  HCT 38.8* 41.0 37.0*  PLT 169  --  153   BMET:  Recent Labs  09/17/13 0420  09/17/13 1705 09/18/13 0345  NA 134*  --  133* 136*  K 4.1  --  4.2 4.1  CL 101  --  95* 97  CO2 22  --   --  28  GLUCOSE 101*  --  144* 113*  BUN 11  --  9 9  CREATININE 0.81  < > 0.90 0.73  CALCIUM 8.3*  --   --  8.9  < > = values in this interval not displayed.  PT/INR:  Recent Labs  09/16/13 1300  LABPROT 15.5*  INR 1.26   ABG    Component Value Date/Time   PHART 7.384 09/16/2013 1933   HCO3 23.1 09/16/2013 1933   TCO2 26 09/17/2013 1705   ACIDBASEDEF 2.0 09/16/2013 1933   O2SAT 90.0 09/16/2013 1933   CBG (last 3)   Recent Labs  09/18/13 0238 09/18/13 0345 09/18/13 0553  GLUCAP 130* 117* 85     Assessment/Plan: S/P Procedure(s) (LRB): CORONARY ARTERY BYPASS GRAFTING (CABG) (N/A) INTRAOPERATIVE TRANSESOPHAGEAL ECHOCARDIOGRAM (N/A) Mobilize Diuresis Diabetes control Plan for transfer to step-down: see transfer orders   LOS: 2 days    BARTLE,BRYAN K 09/18/2013

## 2013-09-18 NOTE — Clinical Documentation Improvement (Signed)
THIS DOCUMENT IS NOT A PERMANENT PART OF THE MEDICAL RECORD  Please update your documentation with the medical record to reflect your response to this query. If you need help knowing how to do this please call [phone].  09/18/13   Dear Dr.Keimani Laufer/Associates,  In a better effort to capture your patient's severity of illness, reflect appropriate length of stay and utilization of resources, a review of the patient medical record has revealed the following indicators.    Based on your clinical judgment, please clarify and document in a progress note and/or discharge summary the clinical condition associated with the following supporting information:  In responding to this query please exercise your independent judgment.  The fact that a query is asked, does not imply that any particular answer is desired or expected.   Please clarify and specify Diabetes type, control, manifestations, and associated conditions.  Possible Clinical Conditions?   _______Diabetes Type  1 or 2 _______Controlled or uncontrolled _______Other Condition _______Cannot Clinically determine    Risk Factors: Patient with a history of diabetes mellitus per 01/13 H&P.  Lab: 09/12/13: Hgb A1c: 10.8 Mean plasma glucose: 263 Glucose, blood: 274  You may use possible, probable, or suspect with inpatient documentation. possible, probable, suspected diagnoses MUST be documented at the time of discharge  Reviewed: additional documentation in the medical record  Thank You,  Marciano Sequin, Clinical Documentation Specialist: [phone] Health Information Management [place]

## 2013-09-18 NOTE — Progress Notes (Signed)
CT surgery p.m. Rounds  Resting comfortably in bed Sinus rhythm Sternal drainage--receiving dressing changes as needed Afebrile Blood sugar this evening 250-will start twice a day Levemir

## 2013-09-18 NOTE — Progress Notes (Signed)
MD notified of increased drainage from MSI. Drainage appears purulent. MD wants dressing changed PRN and pain incision with betadine. Will cont to monitor and assess incision

## 2013-09-19 ENCOUNTER — Encounter (HOSPITAL_COMMUNITY): Payer: Self-pay

## 2013-09-19 ENCOUNTER — Inpatient Hospital Stay (HOSPITAL_COMMUNITY): Payer: Medicaid Other

## 2013-09-19 LAB — GLUCOSE, CAPILLARY
GLUCOSE-CAPILLARY: 156 mg/dL — AB (ref 70–99)
Glucose-Capillary: 131 mg/dL — ABNORMAL HIGH (ref 70–99)
Glucose-Capillary: 139 mg/dL — ABNORMAL HIGH (ref 70–99)
Glucose-Capillary: 169 mg/dL — ABNORMAL HIGH (ref 70–99)

## 2013-09-19 LAB — BASIC METABOLIC PANEL
BUN: 12 mg/dL (ref 6–23)
CO2: 25 mEq/L (ref 19–32)
Calcium: 8.8 mg/dL (ref 8.4–10.5)
Chloride: 95 mEq/L — ABNORMAL LOW (ref 96–112)
Creatinine, Ser: 0.7 mg/dL (ref 0.50–1.35)
GFR calc Af Amer: >90 mL/min (ref >90)
GFR calc non Af Amer: >90 mL/min (ref >90)
Glucose, Bld: 163 mg/dL — ABNORMAL HIGH (ref 70–99)
Potassium: 3.9 mEq/L (ref 3.7–5.3)
Sodium: 131 mEq/L — ABNORMAL LOW (ref 137–147)

## 2013-09-19 LAB — CBC
HCT: 35.2 % — ABNORMAL LOW (ref 39.0–52.0)
Hemoglobin: 12.3 g/dL — ABNORMAL LOW (ref 13.0–17.0)
MCH: 31.7 pg (ref 26.0–34.0)
MCHC: 34.9 g/dL (ref 30.0–36.0)
MCV: 90.7 fL (ref 78.0–100.0)
Platelets: 166 10*3/uL (ref 150–400)
RBC: 3.88 MIL/uL — ABNORMAL LOW (ref 4.22–5.81)
RDW: 13.6 % (ref 11.5–15.5)
WBC: 11.6 10*3/uL — ABNORMAL HIGH (ref 4.0–10.5)

## 2013-09-19 MED ORDER — GLIMEPIRIDE 4 MG PO TABS
4.0000 mg | ORAL_TABLET | Freq: Every day | ORAL | Status: DC
  Administered 2013-09-19 – 2013-09-24 (×6): 4 mg via ORAL
  Filled 2013-09-19 (×7): qty 1

## 2013-09-19 MED ORDER — LEVOFLOXACIN 750 MG PO TABS
750.0000 mg | ORAL_TABLET | Freq: Every day | ORAL | Status: DC
  Administered 2013-09-19 – 2013-09-22 (×4): 750 mg via ORAL
  Filled 2013-09-19 (×5): qty 1

## 2013-09-19 NOTE — Discharge Summary (Signed)
Physician Discharge Summary       [address] [place]       [place] [address]             [id number]    Patient ID: Marc [name] MRN: [id number] DOB/AGE: 05-27-1961 53 y.o.  Admit date: 09/16/2013 Discharge date: 09/19/2013  Admission Diagnoses: 1. History of CAD (history of MI) 2. History of DM 3.History of obesity 4.History of hypertension 5.History of hyperlipidemia 6. History of tobacco abuse 7.History of a fib with RVR 8.History of chronic systolic heart failure 9.History of cardiomyopathy 10. History of medical non compliance  Discharge Diagnoses:  1. History of CAD (history of MI) 2. History of DM 3.History of obesity 4.History of hypertension 5.History of hyperlipidemia 6. History of tobacco abuse 7.History of a fib with RVR 8.History of chronic systolic heart failure 9.History of cardiomyopathy 10. History of medical non compliance 11. Mild ABL anemia 12 Bronchitis  Procedure (s):  1. Median Sternotomy 2. Extracorporeal circulation 3. Coronary artery bypass grafting x 4  Left internal mammary graft to the LAD  Sequential SVG to Ramus and OM2  SVG to OM3 4. Endoscopic vein harvest from the right leg by Dr. Laneta Simmers on 09/16/2013.   History of Presenting Illness: This is a 53 year old smoker with diabetes, HTN, and hyperlipidemia who has a known history of coronary disease by catheterization at [facility] in 1998. He reportedly had significant 3-vessel disease with high grade stenosis in a nondominant RCA, 95% OM2, and 50% mid LAD with normal EF. He was treated medically. He recently presented with shortness of breath with mild exertion and was diagnosed with acute systolic heart failure in the setting of uncontrolled hypertension. His troponin was mildly elevated with a BNP of 1900. He developed A-fib with RVR after admission. An echo showed an EF of 45-50% with infero-apical hypokinesis. He improved with medical therapy. On follow up, he was not taking  his medications and arrangements were made for him to get his meds through the [place] assistance program. He presented again to [facility] with acute on chronic systolic heart failure and was started on Lasix. He continues to have exertional dyspnea, fatigue but no chest pain. Cardiac cath now shows severe 3-vessel coronary disease with 60% distal LM, 80% LAD, 90% stenosis of 3 marginal branches in the dominant LCX, and an occluded nondominant RCA. A cardiothoracic consultation was obtained with Dr. Laneta Simmers for the consideration of coronary artery bypass grafting surgery. Potential risks, complications, and benefits were discussed with the patient and he agreed to proceed with surgery. Pre operative carotid duplex US showed no significant carotid artery disease bilaterally. ABIs were 0.77 on the right and 0.89 on the left. He underwent a CABG x 4 on 09/16/2013.  Brief Hospital Course:  The patient was extubated the evening of surgery without difficulty. He/she remained afebrile and hemodynamically stable. Theone Murdoch, a line, chest tubes, and foley were removed early in the post operative course. Lopressor was started and titrated accordingly. He was volume over loaded and diuresed. He was weaned off the insulin drip.He has been started on Glyburide 4 mg daily, Metformin 1000 bid, and Insulin. The patient's HGA1C pre op was 10.8.He will need to obtain a medical doctor to follow his diabetes after discharge. He then developed bronchitis and was treated with Levaquin.The patient was felt surgically stable for transfer from the ICU to [facility]U for further convalescence on 09/19/2012. He continues to progress with cardiac rehab. He is still requiring 2  liters of oxygen via [place]. Hopefully, he can be weaned to room air. He has been tolerating a diet and has had a bowel movement. Epicardial pacing wires and chest tube sutures will be removed prior to discharge. Provided the patient remains afebrile, hemodynamically  stable, and pending morning round evaluation, he will be surgically stable for discharge .   Latest Vital Signs: Blood pressure 160/94, pulse 93, temperature 98 F (36.7 C), temperature source Oral, resp. rate 20, weight 111.4 kg (245 lb 9.5 oz), SpO2 98.00%.  Physical Exam: General appearance: alert and cooperative  Neurologic: intact  Heart: regular rate and rhythm, S1, S2 normal, no murmur, click, rub or gallop  Lungs: clear to auscultation bilaterally  Extremities: extremities normal, atraumatic, no cyanosis or edema  Wound: serosanguinous drainage from upper portion of the chest incision. No erythema. Sternum stable. Leg incisions ok  Discharge Condition:Stable  Recent laboratory studies:  Lab Results  Component Value Date   WBC 11.6* 09/19/2013   HGB 12.3* 09/19/2013   HCT 35.2* 09/19/2013   MCV 90.7 09/19/2013   PLT 166 09/19/2013   Lab Results  Component Value Date   NA 131* 09/19/2013   K 3.9 09/19/2013   CL 95* 09/19/2013   CO2 25 09/19/2013   CREATININE 0.70 09/19/2013   GLUCOSE 163* 09/19/2013      Diagnostic Studies:Dg Chest Port 1 View  09/19/2013   CLINICAL DATA:  53 year old male status post CABG. Initial encounter.  EXAM: PORTABLE CHEST - 1 VIEW  COMPARISON:  09/18/2013 and earlier.  FINDINGS: Portable AP upright view at 0626 hrs. Right IJ introducer sheath removed. Stable cardiac size and mediastinal contours. Stable lung volumes. No pneumothorax or pulmonary edema. Evidence of small bilateral pleural effusions. No consolidation.  IMPRESSION: 1. Right IJ introducer sheath removed. 2. Small bilateral pleural effusions.   Electronically Signed   By: Augusto Gamble M.D.   On: 09/19/2013 07:39     Future Appointments Provider Department Dept Phone   10/08/2013 10:00 AM [name], MD [facility] [phone]     Follow-up Information   Follow up On 10/06/2013. (Appointment time is at 2:15 pm. Also call his office to arrange PT/INR  blood test in next few days to adjust coumadin dose)    Contact information:   [facility] [address] [place] [address] [phone]       Follow up with [name], MD On 10/08/2013. (PA/LAT CXR to be taken (at [facility] which is in the same building as Dr. Sharee Pimple office) on 10/08/2013 at 9:00 am;Appointment time with Dr. Laneta Simmers is at 10:00 am)    Specialty:  Cardiothoracic Surgery   Contact information:   [address] [place] [address] [phone]       Follow up with Obtain a primary care doctor. (He needs to obtain a medical doctor to follow his diabetes management as well as pre op HGA1C  10.8)      Discharge Medications:   Medication List    STOP taking these medications       amLODipine 10 MG tablet  Commonly known as:  NORVASC     aspirin 325 MG tablet  Replaced by:  aspirin 81 MG EC tablet      TAKE these medications       ALPRAZolam 0.25 MG tablet  Commonly known as:  XANAX  Take 0.25 mg by mouth at bedtime as needed for anxiety.  amiodarone 400 MG tablet  Commonly known as:  PACERONE  Take 0.5 tablets (200 mg total) by mouth 2 (two) times daily.     aspirin 81 MG EC tablet  Take 1 tablet (81 mg total) by mouth daily.     carvedilol 25 MG tablet  Commonly known as:  COREG  Take 1 tablet (25 mg total) by mouth 2 (two) times daily.     furosemide 40 MG tablet  Commonly known as:  LASIX  Take 1 tablet (40 mg total) by mouth daily.     glimepiride 4 MG tablet  Commonly known as:  AMARYL  Take 1 tablet (4 mg total) by mouth daily with breakfast.     lisinopril 20 MG tablet  Commonly known as:  PRINIVIL,ZESTRIL  Take 1 tablet (20 mg total) by mouth daily.     metFORMIN 1000 MG tablet  Commonly known as:  GLUCOPHAGE  Take 1 tablet (1,000 mg total) by mouth 2 (two) times daily with a meal.     multivitamin with minerals Tabs tablet  Take 1 tablet by mouth daily.     nicotine 21  mg/24hr patch  Commonly known as:  NICODERM CQ - dosed in mg/24 hours  Place 1 patch (21 mg total) onto the skin daily.     oxyCODONE 5 MG immediate release tablet  Commonly known as:  Oxy IR/ROXICODONE  Take 1-2 tablets (5-10 mg total) by mouth every 4 (four) hours as needed for severe pain.     potassium chloride SA 20 MEQ tablet  Commonly known as:  K-DUR,KLOR-CON  Take 1 tablet (20 mEq total) by mouth daily.     simvastatin 20 MG tablet  Commonly known as:  ZOCOR  Take 1 tablet (20 mg total) by mouth daily at 6 PM.     warfarin 5 MG tablet  Commonly known as:  COUMADIN  Take 1 tablet (5 mg total) by mouth daily.        The patient has been discharged on:   1.Beta Blocker:  Yes [ x  ]                              No   [   ]                              If No, reason:  2.Ace Inhibitor/ARB: Yes [ x  ]                                     No  [    ]                                     If No, reason:  3.Statin:   Yes [ x  ]                  No  [   ]                  If No, reason:  4.Ecasa:  Yes  [  x ]                  No   [   ]  If No, reason:  Signed: ZIMMERMAN,DONIELLE MPA-C 09/19/2013, 1:23 PM

## 2013-09-19 NOTE — Progress Notes (Addendum)
3 Days Post-Op Procedure(s) (LRB): CORONARY ARTERY BYPASS GRAFTING (CABG) (N/A) INTRAOPERATIVE TRANSESOPHAGEAL ECHOCARDIOGRAM (N/A) Subjective: Coughing alot  Objective: Vital signs in last 24 hours: Temp:  [98 F (36.7 C)-98.7 F (37.1 C)] 98 F (36.7 C) (01/16 0738) Pulse Rate:  [44-95] 88 (01/16 0700) Cardiac Rhythm:  [-] Normal sinus rhythm (01/16 0600) Resp:  [13-27] 14 (01/16 0700) BP: (94-179)/(60-117) 147/73 mmHg (01/16 0600) SpO2:  [90 %-99 %] 97 % (01/16 0700) Weight:  [111.4 kg (245 lb 9.5 oz)] 111.4 kg (245 lb 9.5 oz) (01/16 0400)  Hemodynamic parameters for last 24 hours:    Intake/Output from previous day: 01/15 0701 - 01/16 0700 In: 1760 [P.O.:1740; I.V.:20] Out: 3225 [Urine:3225] Intake/Output this shift:    General appearance: alert and cooperative Neurologic: intact Heart: regular rate and rhythm, S1, S2 normal, no murmur, click, rub or gallop Lungs: clear to auscultation bilaterally Extremities: extremities normal, atraumatic, no cyanosis or edema Wound: serosanguinous drainage from upper portion of the chest incision. No erythema. Sternum stable. Leg incisions ok  Lab Results:  Recent Labs  09/18/13 0345 09/19/13 0230  WBC 14.2* 11.6*  HGB 13.0 12.3*  HCT 37.0* 35.2*  PLT 153 166   BMET:  Recent Labs  09/18/13 0345 09/19/13 0230  NA 136* 131*  K 4.1 3.9  CL 97 95*  CO2 28 25  GLUCOSE 113* 163*  BUN 9 12  CREATININE 0.73 0.70  CALCIUM 8.9 8.8    PT/INR:  Recent Labs  09/16/13 1300  LABPROT 15.5*  INR 1.26   ABG    Component Value Date/Time   PHART 7.384 09/16/2013 1933   HCO3 23.1 09/16/2013 1933   TCO2 26 09/17/2013 1705   ACIDBASEDEF 2.0 09/16/2013 1933   O2SAT 90.0 09/16/2013 1933   CBG (last 3)   Recent Labs  09/18/13 1109 09/18/13 1611 09/18/13 2119  GLUCAP 237* 232* 222*   CXR: clear lungs, tiny effusions.  Assessment/Plan: S/P Procedure(s) (LRB): CORONARY ARTERY BYPASS GRAFTING (CABG) (N/A) INTRAOPERATIVE  TRANSESOPHAGEAL ECHOCARDIOGRAM (N/A) Mobilize, IS  Diuresis: Wt still 9 lbs over preop  Type 2 diabetes, uncontrolled: glucose went over 200 yesterday. Increase Levemir. His Hgb A1c was 10.8 preop so he is poorly controlled at home on metformin alone. Will start another oral agent to see if he can be controlled on oral agents and diet modification.  Will put on Levaquin daily for possible bronchitis and chest incision drainage. Transfer to 2W today. Bed not available yesterday.   LOS: 3 days    BARTLE,BRYAN K 09/19/2013

## 2013-09-19 NOTE — Progress Notes (Signed)
Inpatient Diabetes Program Recommendations  AACE/ADA: New Consensus Statement on Inpatient Glycemic Control (2013)  Target Ranges:  Prepandial:   less than 140 mg/dL      Peak postprandial:   less than 180 mg/dL (1-2 hours)      Critically ill patients:  140 - 180 mg/dL   Reason for Visit: Results for Marc [name], Marc [name] (MRN [id number]) as of 09/19/2013 16:23  Ref. Range 09/12/2013 15:53  Hemoglobin A1C Latest Range: <5.7 % 10.8 (H)   Spoke to patient regarding elevated A1C.  He does not have PCP but goes to the clinic at times.  He has attended Outpatient diabetes education [facility] the [facility] [place].  States he was diagnosed with diabetes 6 years ago.  May need insulin at discharge based on A1C? Note plans to start another oral agent for glycemic control.  Needs close follow-up of diabetes after discharge.  He verbalized understanding.  Beryl Meager, RN, BC-ADM Inpatient Diabetes Coordinator Pager [phone]

## 2013-09-19 NOTE — Progress Notes (Signed)
CARDIAC REHAB PHASE I   PRE:  Rate/Rhythm:89 SR  BP:  Supine   Sitting: 140/80  Standing:    SaO2: 98% 2L O2  Since this was so high I had patient sit on edge of bed on RA to monitor any changes.  He was able to maintain saturations ay 95% on RA   MODE:  Ambulation: 350 ft   POST:  Rate/Rhythm: 100 ST  BP:  Supine:   Sitting: 146/80  Standing:    SaO2: 89-91% RA  I monitored saturations continually while walking on RA they never dropped below 89%. Tolerated ambulation well with assistance x1, IS encouraged and demonstrated.  Progressing well.  Reported to nurse that I left patient on RA.  Rechecked saturations upon leaving room and they were 94%. [id number]  Cindra Eves RN, BSN 09/19/2013 11:07 AM

## 2013-09-19 NOTE — Progress Notes (Signed)
Pt transferred to 2W17.  Report was called to RN prior to transfer.  Pt ambulated from 2S to 2W with assistance of a wheelchair.  All VS WNL upon transfer with 2L N/C.  P tam bulated well and was in a low ST 39f 102 upon arrival.  Pt did not desat. And was admitted to the chair upon arrival.

## 2013-09-20 LAB — GLUCOSE, CAPILLARY
GLUCOSE-CAPILLARY: 179 mg/dL — AB (ref 70–99)
GLUCOSE-CAPILLARY: 157 mg/dL — AB (ref 70–99)
Glucose-Capillary: 122 mg/dL — ABNORMAL HIGH (ref 70–99)
Glucose-Capillary: 99 mg/dL (ref 70–99)

## 2013-09-20 MED ORDER — LACTULOSE 10 GM/15ML PO SOLN
20.0000 g | Freq: Every day | ORAL | Status: DC | PRN
  Filled 2013-09-20: qty 30

## 2013-09-20 MED ORDER — INSULIN DETEMIR 100 UNIT/ML ~~LOC~~ SOLN
10.0000 [IU] | Freq: Two times a day (BID) | SUBCUTANEOUS | Status: DC
  Administered 2013-09-20 – 2013-09-21 (×4): 10 [IU] via SUBCUTANEOUS
  Filled 2013-09-20 (×5): qty 0.1

## 2013-09-20 MED ORDER — LISINOPRIL 5 MG PO TABS
5.0000 mg | ORAL_TABLET | Freq: Every day | ORAL | Status: DC
  Administered 2013-09-20: 5 mg via ORAL
  Filled 2013-09-20 (×2): qty 1

## 2013-09-20 MED ORDER — AMIODARONE IV BOLUS ONLY 150 MG/100ML
150.0000 mg | Freq: Once | INTRAVENOUS | Status: AC
  Administered 2013-09-20: 150 mg via INTRAVENOUS
  Filled 2013-09-20: qty 100

## 2013-09-20 MED ORDER — AMIODARONE HCL 200 MG PO TABS
400.0000 mg | ORAL_TABLET | Freq: Two times a day (BID) | ORAL | Status: DC
  Administered 2013-09-20 – 2013-09-24 (×9): 400 mg via ORAL
  Filled 2013-09-20 (×10): qty 2

## 2013-09-20 NOTE — Progress Notes (Signed)
Shift Summary:  No acute events this shift.  Patient has been afebrile; VSS.  Pain managed with pain meds per order.  Patient has been ambulating independently in room and hall this shift; activity tolerated well.  Incentive spirometry at bedside.  Deep breathing, coughing and splinting encouraged.  Will continue to monitor.

## 2013-09-20 NOTE — Progress Notes (Signed)
Pt requesting "something for nerves"; pt given PO Xanax at this time; will cont. To monitor.

## 2013-09-20 NOTE — Progress Notes (Addendum)
      [address]       [place] [address]             [phone]      4 Days Post-Op Procedure(s) (LRB): CORONARY ARTERY BYPASS GRAFTING (CABG) (N/A) INTRAOPERATIVE TRANSESOPHAGEAL ECHOCARDIOGRAM (N/A)  Subjective:  Mr. Hodor has no complaints this morning.  He wants to go home.  He developed A. Fib with RVR this morning.  + ambulation, No BM  Objective: Vital signs in last 24 hours: Temp:  [97.2 F (36.2 C)-97.5 F (36.4 C)] 97.2 F (36.2 C) (01/17 0500) Pulse Rate:  [71-93] 71 (01/17 0500) Cardiac Rhythm:  [-] Normal sinus rhythm (01/16 1030) Resp:  [18-20] 18 (01/17 0500) BP: (122-160)/(85-94) 122/85 mmHg (01/17 0500) SpO2:  [93 %-98 %] 97 % (01/17 0500) Weight:  [239 lb 10.2 oz (108.7 kg)] 239 lb 10.2 oz (108.7 kg) (01/17 0500)   Intake/Output from previous day: 01/16 0701 - 01/17 0700 In: 540 [P.O.:540] Out: -   General appearance: alert, cooperative and no distress Heart: irregularly irregular rhythm Lungs: clear to auscultation bilaterally Abdomen: soft, non-tender; bowel sounds normal; no masses,  no organomegaly Extremities: edema trace Wound: clean and dry  Lab Results:  Recent Labs  09/18/13 0345 09/19/13 0230  WBC 14.2* 11.6*  HGB 13.0 12.3*  HCT 37.0* 35.2*  PLT 153 166   BMET:  Recent Labs  09/18/13 0345 09/19/13 0230  NA 136* 131*  K 4.1 3.9  CL 97 95*  CO2 28 25  GLUCOSE 113* 163*  BUN 9 12  CREATININE 0.73 0.70  CALCIUM 8.9 8.8    PT/INR: No results found for this basename: LABPROT, INR,  in the last 72 hours ABG    Component Value Date/Time   PHART 7.384 09/16/2013 1933   HCO3 23.1 09/16/2013 1933   TCO2 26 09/17/2013 1705   ACIDBASEDEF 2.0 09/16/2013 1933   O2SAT 90.0 09/16/2013 1933   CBG (last 3)   Recent Labs  09/19/13 1616 09/19/13 2146 09/20/13 0647  GLUCAP 139* 169* 122*    Assessment/Plan: S/P Procedure(s) (LRB): CORONARY ARTERY BYPASS GRAFTING (CABG) (N/A) INTRAOPERATIVE TRANSESOPHAGEAL  ECHOCARDIOGRAM (N/A)  1. CV- Rapid Afib- will bolus with IV Amiodarone, continue Lopressor, Start oral Amiodarone at 400 mg BID, Hypertensive- will start ACE 2. Pulm- no acute issues, off oxygen encouraged IS 3. Renal- volume status is stable, continue lasix 4. Wound- sternotomy with some bloody drainage, also some purulent drainage present, on Levaquin 5. GI- Constipation will order Lactulose prn 6. DM- sugar control improved with use of Amaryl, will continue to wean insulin as sugars permit, ideally would like to discharge patient on oral mediations 7. Dispo- patient with new onset A. Fib only has peripheral access, will discuss further management with staff   LOS: 4 days    BARRETT, ERIN 09/20/2013  I have seen and examined the patient and agree with the assessment and plan as outlined.  Start oral amiodarone for post op Afib.  Mobilize.  Continue levaquin and watch sternal drainage.  Waniya Hoglund H 09/20/2013 11:43 AM

## 2013-09-20 NOTE — Progress Notes (Signed)
Patient in Afib HR in the 140s; patient reports he can feel palpitations.  Denny Peon, PA at bedside made aware.  Advised new orders will be given.  Dayshift nurse updated.

## 2013-09-20 NOTE — Progress Notes (Signed)
Pt ambulated in hallway 550 ft and tolerated activity well. Will continue to monitor.

## 2013-09-20 NOTE — Progress Notes (Signed)
CARDIAC REHAB PHASE I   PRE:  Rate/Rhythm:117 afib  BP:  Supine:   Sitting: 106/70  Standing:    SaO2: 96% ra  MODE:  Ambulation: 550 ft   POST:  Rate/Rhythem: 103 afib  BP:  Supine:   Sitting: 128/80  Standing:   SaO2: 96%ra  1440-1505 Pt ambulated in hallway without difficulty, steady gait. Pt returned to chair, call light in reach.  Education completed including restrictions,activity progression,  diet/nutrition, surgical followup and when to call MD.  Good pulmonary toilet reinforced.   Pt oriented to outpatient cardiac rehab. At pt request, referral will be sent to [facility].    [place], [place]

## 2013-09-21 LAB — GLUCOSE, CAPILLARY
Glucose-Capillary: 185 mg/dL — ABNORMAL HIGH (ref 70–99)
Glucose-Capillary: 196 mg/dL — ABNORMAL HIGH (ref 70–99)
Glucose-Capillary: 136 mg/dL — ABNORMAL HIGH (ref 70–99)

## 2013-09-21 LAB — PROTIME-INR
INR: 0.98 (ref 0.00–1.49)
Prothrombin Time: 12.8 seconds (ref 11.6–15.2)

## 2013-09-21 MED ORDER — LISINOPRIL 10 MG PO TABS
10.0000 mg | ORAL_TABLET | Freq: Every day | ORAL | Status: DC
  Administered 2013-09-21 – 2013-09-22 (×2): 10 mg via ORAL
  Filled 2013-09-21 (×3): qty 1

## 2013-09-21 MED ORDER — ASPIRIN EC 81 MG PO TBEC
81.0000 mg | DELAYED_RELEASE_TABLET | Freq: Every day | ORAL | Status: DC
  Administered 2013-09-22 – 2013-09-24 (×3): 81 mg via ORAL
  Filled 2013-09-21 (×3): qty 1

## 2013-09-21 MED ORDER — CARVEDILOL 25 MG PO TABS
25.0000 mg | ORAL_TABLET | Freq: Two times a day (BID) | ORAL | Status: DC
  Administered 2013-09-21 – 2013-09-24 (×6): 25 mg via ORAL
  Filled 2013-09-21 (×8): qty 1

## 2013-09-21 MED ORDER — WARFARIN - PHYSICIAN DOSING INPATIENT
Freq: Every day | Status: DC
  Administered 2013-09-21: 18:00:00

## 2013-09-21 MED ORDER — FUROSEMIDE 40 MG PO TABS
40.0000 mg | ORAL_TABLET | Freq: Two times a day (BID) | ORAL | Status: DC
  Administered 2013-09-22 – 2013-09-24 (×5): 40 mg via ORAL
  Filled 2013-09-21 (×7): qty 1

## 2013-09-21 MED ORDER — POTASSIUM CHLORIDE CRYS ER 20 MEQ PO TBCR
20.0000 meq | EXTENDED_RELEASE_TABLET | Freq: Two times a day (BID) | ORAL | Status: DC
  Administered 2013-09-21 – 2013-09-24 (×7): 20 meq via ORAL
  Filled 2013-09-21 (×9): qty 1

## 2013-09-21 MED ORDER — ENOXAPARIN SODIUM 30 MG/0.3ML ~~LOC~~ SOLN
30.0000 mg | Freq: Two times a day (BID) | SUBCUTANEOUS | Status: DC
  Administered 2013-09-21 – 2013-09-24 (×7): 30 mg via SUBCUTANEOUS
  Filled 2013-09-21 (×9): qty 0.3

## 2013-09-21 MED ORDER — WARFARIN SODIUM 2.5 MG PO TABS
2.5000 mg | ORAL_TABLET | Freq: Once | ORAL | Status: AC
  Administered 2013-09-21: 2.5 mg via ORAL
  Filled 2013-09-21: qty 1

## 2013-09-21 MED ORDER — FUROSEMIDE 10 MG/ML IJ SOLN
40.0000 mg | Freq: Once | INTRAMUSCULAR | Status: AC
  Administered 2013-09-21: 40 mg via INTRAVENOUS
  Filled 2013-09-21: qty 4

## 2013-09-21 NOTE — Progress Notes (Addendum)
      [address]       [place] [address]             [phone]      5 Days Post-Op Procedure(s) (LRB): CORONARY ARTERY BYPASS GRAFTING (CABG) (N/A) INTRAOPERATIVE TRANSESOPHAGEAL ECHOCARDIOGRAM (N/A)  Subjective:  Marc [name] has no complaints.  He states he is not coughing as much as yesterday.  He is ambulating independently.  No BM  Objective: Vital signs in last 24 hours: Temp:  [97.7 F (36.5 C)-98.1 F (36.7 C)] 98.1 F (36.7 C) (01/18 0425) Pulse Rate:  [54-108] 96 (01/18 0425) Cardiac Rhythm:  [-] Atrial fibrillation (01/17 2002) Resp:  [18-20] 20 (01/18 0425) BP: (124-165)/(79-91) 139/91 mmHg (01/18 0425) SpO2:  [97 %-98 %] 97 % (01/18 0425) Weight:  [240 lb 4.8 oz (108.999 kg)] 240 lb 4.8 oz (108.999 kg) (01/18 0425)  Intake/Output from previous day: 01/17 0701 - 01/18 0700 In: 1630 [P.O.:1630] Out: -   General appearance: alert, cooperative and no distress Heart: irregularly irregular rhythm Lungs: clear to auscultation bilaterally Abdomen: soft, non-tender; bowel sounds normal; no masses,  no organomegaly Extremities: edema trace Wound: sternotomy has some purulent drainage present, appears improved, EVH site clean and dry  Lab Results:  Recent Labs  09/19/13 0230  WBC 11.6*  HGB 12.3*  HCT 35.2*  PLT 166   BMET:  Recent Labs  09/19/13 0230  NA 131*  K 3.9  CL 95*  CO2 25  GLUCOSE 163*  BUN 12  CREATININE 0.70  CALCIUM 8.8    PT/INR: No results found for this basename: LABPROT, INR,  in the last 72 hours ABG    Component Value Date/Time   PHART 7.384 09/16/2013 1933   HCO3 23.1 09/16/2013 1933   TCO2 26 09/17/2013 1705   ACIDBASEDEF 2.0 09/16/2013 1933   O2SAT 90.0 09/16/2013 1933   CBG (last 3)   Recent Labs  09/20/13 1611 09/20/13 2124 09/21/13 0618  GLUCAP 99 157* 185*    Assessment/Plan: S/P Procedure(s) (LRB): CORONARY ARTERY BYPASS GRAFTING (CABG) (N/A) INTRAOPERATIVE TRANSESOPHAGEAL  ECHOCARDIOGRAM (N/A)  1. CV- Remains in A. Fib rate in the 90s- received Amiodarone bolus yesterday, on PO Amiodarone and Lopressor, patient may benefit from being placed on Home Coreg instead of Lopressor, will give IV bolus of Lopressor this morning, remains hypertensive will increase Lisinopril 2. Pulm- no acute issues, encouraged IS 3. Renal- volume status remains stable, on Lasix 4. DM- sugars are controlled, remains on Levemir, Metformin and Amaryl- sugars are below 200, but should ideally below at 150 or below, will continue current regimen for now 5. LOC Constipation- Lactulose is ordered 6. Dispo- patient remains in A.Fib need to get converted to NSR, if not will need Coumadin, hypertensive will increase Lopressor   LOS: 5 days    Marc [name], Marc [name] 09/21/2013  I have seen and examined the patient and agree with the assessment and plan as outlined.  Staying in rate-controlled Afib.  Continue amiodarone and start coumadin.  Looks quite volume overloaded although weight only 2 kg above baseline.  Restart lasix.    Marc [name] 09/21/2013 2:47 PM

## 2013-09-22 LAB — GLUCOSE, CAPILLARY
GLUCOSE-CAPILLARY: 158 mg/dL — AB (ref 70–99)
GLUCOSE-CAPILLARY: 105 mg/dL — AB (ref 70–99)
GLUCOSE-CAPILLARY: 92 mg/dL (ref 70–99)
Glucose-Capillary: 108 mg/dL — ABNORMAL HIGH (ref 70–99)
Glucose-Capillary: 121 mg/dL — ABNORMAL HIGH (ref 70–99)

## 2013-09-22 LAB — CBC
HCT: 35.6 % — ABNORMAL LOW (ref 39.0–52.0)
HEMOGLOBIN: 12.4 g/dL — AB (ref 13.0–17.0)
MCH: 31 pg (ref 26.0–34.0)
MCHC: 34.8 g/dL (ref 30.0–36.0)
MCV: 89 fL (ref 78.0–100.0)
PLATELETS: 290 10*3/uL (ref 150–400)
RBC: 4 MIL/uL — ABNORMAL LOW (ref 4.22–5.81)
RDW: 13.3 % (ref 11.5–15.5)
WBC: 7.7 10*3/uL (ref 4.0–10.5)

## 2013-09-22 LAB — BASIC METABOLIC PANEL
BUN: 20 mg/dL (ref 6–23)
CO2: 27 mEq/L (ref 19–32)
Calcium: 9.7 mg/dL (ref 8.4–10.5)
Chloride: 95 mEq/L — ABNORMAL LOW (ref 96–112)
Creatinine, Ser: 1.07 mg/dL (ref 0.50–1.35)
GFR, EST AFRICAN AMERICAN: 89 mL/min — AB (ref >90)
GFR, EST NON AFRICAN AMERICAN: 77 mL/min — AB (ref >90)
GLUCOSE: 131 mg/dL — AB (ref 70–99)
POTASSIUM: 4.5 meq/L (ref 3.7–5.3)
Sodium: 135 mEq/L — ABNORMAL LOW (ref 137–147)

## 2013-09-22 LAB — PROTIME-INR
INR: 1.05 (ref 0.00–1.49)
PROTHROMBIN TIME: 13.5 s (ref 11.6–15.2)

## 2013-09-22 LAB — MAGNESIUM: Magnesium: 1.6 mg/dL (ref 1.5–2.5)

## 2013-09-22 LAB — TSH: TSH: 2.472 u[IU]/mL (ref 0.350–4.500)

## 2013-09-22 MED ORDER — COUMADIN BOOK
Freq: Once | Status: AC
  Administered 2013-09-22: 11:00:00
  Filled 2013-09-22: qty 1

## 2013-09-22 MED ORDER — WARFARIN SODIUM 5 MG PO TABS
5.0000 mg | ORAL_TABLET | Freq: Once | ORAL | Status: AC
  Administered 2013-09-22: 5 mg via ORAL
  Filled 2013-09-22 (×2): qty 1

## 2013-09-22 NOTE — Progress Notes (Signed)
Pt ambulated around unit independently this morning. Tolerated well

## 2013-09-22 NOTE — Discharge Instructions (Signed)
Activity: 1.May walk up steps                2.No lifting more than ten pounds for four weeks.                 3.No driving for four weeks.                4.Stop any activity that causes chest pain, shortness of breath, dizziness, sweating or excessive weakness.                5.Avoid straining.                6.Continue with your breathing exercises daily.  Diet: Diabetic diet and Low fat, Low salt diet  Wound Care: May shower.  Clean wounds with mild soap and water daily. Contact the office at [phone] if any problems arise.  Coronary Artery Bypass Grafting, Care After Refer to this sheet in the next few weeks. These instructions provide you with information on caring for yourself after your procedure. Your health care provider may also give you more specific instructions. Your treatment has been planned according to current medical practices, but problems sometimes occur. Call your health care provider if you have any problems or questions after your procedure. WHAT TO EXPECT AFTER THE PROCEDURE Recovery from surgery will be different for everyone. Some people feel well after 3 or 4 weeks, while for others it takes longer. After your procedure, it is typical to have the following:  Nausea and a lack of appetite.   Constipation.  Weakness and fatigue.   Depression or irritability.   Pain or discomfort at your incision site. HOME CARE INSTRUCTIONS  Only take over-the-counter or prescription medicines as directed by your health care provider. Take all medicines exactly as directed. Do not stop taking medicines or start any new medicines without first checking with your health care provider.   Take your pulse as directed by your health care provider.  Perform deep breathing as directed by your health care provider. If you were given a device called an incentive spirometer, use it to practice deep breathing several times a day. Support your chest with a pillow or your arms when  you take deep breaths or cough.  Keep incision areas clean, dry, and protected. Remove or change any bandages (dressings) only as directed by your health care provider. You may have skin adhesive strips over the incision areas. Do not take the strips off. They will fall off on their own.  Check incision areas daily for any swelling, redness, or drainage.  If incisions were made in your legs, do the following:  Avoid crossing your legs.   Avoid sitting for long periods of time. Change positions every 30 minutes.   Elevate your legs when you are sitting.   Wear compression stockings as directed by your health care provider. These stockings help keep blood clots from forming in your legs.  Take showers once your health care provider approves. Until then, only take sponge baths. Pat incisions dry. Do not rub incisions with a washcloth or towel. Do not take tub baths or go swimming until your health care provider approves.  Eat foods that are high in fiber, such as raw fruits and vegetables, whole grains, beans, and nuts. Meats should be lean cut. Avoid canned, processed, and fried foods.  Drink enough fluids to keep your urine clear or pale yellow.  Weigh yourself every day. This helps identify if you are  retaining fluid that may make your heart and lungs work harder.   Rest and limit activity as directed by your health care provider. You may be instructed to:  Stop any activity at once if you have chest pain, shortness of breath, irregular heartbeats, or dizziness. Get help right away if you have any of these symptoms.  Move around frequently for short periods or take short walks as directed by your health care provider. Increase your activities gradually. You may need physical therapy or cardiac rehabilitation to help strengthen your muscles and build your endurance.  Avoid lifting, pushing, or pulling anything heavier than 10 lb (4.5 kg) for at least 6 weeks after surgery.  Do not  drive until your health care provider approves.  Ask your health care provider when you may return to work and resume sexual activity.  Follow up with your health care provider as directed.  SEEK MEDICAL CARE IF:  You have swelling, redness, increasing pain, or drainage at the site of an incision.   You develop a fever.   You have swelling in your ankles or legs.   You have pain in your legs.   You have weight gain of 2 or more pounds a day.  You are nauseous or vomit.  You have diarrhea. SEEK IMMEDIATE MEDICAL CARE IF:  You have chest pain that goes to your jaw or arms.  You have shortness of breath.   You have a fast or irregular heartbeat.   You notice a "clicking" in your breastbone (sternum) when you move.   You have numbness or weakness in your arms or legs.  You feel dizzy or lightheaded.  MAKE SURE YOU:  Understand these instructions.  Will watch your condition.  Will get help right away if you are not doing well or get worse. Document Released: 03/10/2005 Document Revised: 04/23/2013 Document Reviewed: 01/28/2013 [facility] Patient Information 2014 [place], [place].   Information on my medicine - Coumadin   (Warfarin)  This medication education was reviewed with me or my healthcare representative as part of my discharge preparation.  The pharmacist that spoke with me during my hospital stay was:  Elwin Sleight, [facility]  Why was Coumadin prescribed for you? Coumadin was prescribed for you because you have a blood clot or a medical condition that can cause an increased risk of forming blood clots. Blood clots can cause serious health problems by blocking the flow of blood to the heart, lung, or brain. Coumadin can prevent harmful blood clots from forming. As a reminder your indication for Coumadin is:   Stroke Prevention Because Of Atrial Fibrillation  What test will check on my response to Coumadin? While on Coumadin (warfarin) you will need to  have an INR test regularly to ensure that your dose is keeping you in the desired range. The INR (international normalized ratio) number is calculated from the result of the laboratory test called prothrombin time (PT).  If an INR APPOINTMENT HAS NOT ALREADY BEEN MADE FOR YOU please schedule an appointment to have this lab work done by your health care provider within 7 days. Your INR goal is usually a number between:  2 to 3 or your provider may give you a more narrow range like 2-2.5.  Ask your health care provider during an office visit what your goal INR is.  What  do you need to  know  About  COUMADIN? Take Coumadin (warfarin) exactly as prescribed by your healthcare provider about the same time each  day.  DO NOT stop taking without talking to the doctor who prescribed the medication.  Stopping without other blood clot prevention medication to take the place of Coumadin may increase your risk of developing a new clot or stroke.  Get refills before you run out.  What do you do if you miss a dose? If you miss a dose, take it as soon as you remember on the same day then continue your regularly scheduled regimen the next day.  Do not take two doses of Coumadin at the same time.  Important Safety Information A possible side effect of Coumadin (Warfarin) is an increased risk of bleeding. You should call your healthcare provider right away if you experience any of the following:   Bleeding from an injury or your nose that does not stop.   Unusual colored urine (red or dark brown) or unusual colored stools (red or black).   Unusual bruising for unknown reasons.   A serious fall or if you hit your head (even if there is no bleeding).  Some foods or medicines interact with Coumadin (warfarin) and might alter your response to warfarin. To help avoid this:   Eat a balanced diet, maintaining a consistent amount of Vitamin K.   Notify your provider about major diet changes you plan to make.   Avoid  alcohol or limit your intake to 1 drink for women and 2 drinks for men per day. (1 drink is 5 oz. wine, 12 oz. beer, or 1.5 oz. liquor.)  Make sure that ANY health care provider who prescribes medication for you knows that you are taking Coumadin (warfarin).  Also make sure the healthcare provider who is monitoring your Coumadin knows when you have started a new medication including herbals and non-prescription products.  Coumadin (Warfarin)  Major Drug Interactions  Increased Warfarin Effect Decreased Warfarin Effect  Alcohol (large quantities) Antibiotics (esp. Septra/Bactrim, Flagyl, Cipro) Amiodarone (Cordarone) Aspirin (ASA) Cimetidine (Tagamet) Megestrol (Megace) NSAIDs (ibuprofen, naproxen, etc.) Piroxicam (Feldene) Propafenone (Rythmol SR) Propranolol (Inderal) Isoniazid (INH) Posaconazole (Noxafil) Barbiturates (Phenobarbital) Carbamazepine (Tegretol) Chlordiazepoxide (Librium) Cholestyramine (Questran) Griseofulvin Oral Contraceptives Rifampin Sucralfate (Carafate) Vitamin K   Coumadin (Warfarin) Major Herbal Interactions  Increased Warfarin Effect Decreased Warfarin Effect  Garlic Ginseng Ginkgo biloba Coenzyme Q10 Green tea St. Johns wort    Coumadin (Warfarin) FOOD Interactions  Eat a consistent number of servings per week of foods HIGH in Vitamin K (1 serving =  cup)  Collards (cooked, or boiled & drained) Kale (cooked, or boiled & drained) Mustard greens (cooked, or boiled & drained) Parsley *serving size only =  cup Spinach (cooked, or boiled & drained) Swiss chard (cooked, or boiled & drained) Turnip greens (cooked, or boiled & drained)  Eat a consistent number of servings per week of foods MEDIUM-HIGH in Vitamin K (1 serving = 1 cup)  Asparagus (cooked, or boiled & drained) Broccoli (cooked, boiled & drained, or raw & chopped) Brussel sprouts (cooked, or boiled & drained) *serving size only =  cup Lettuce, raw (green leaf, endive,  romaine) Spinach, raw Turnip greens, raw & chopped   These websites have more information on Coumadin (warfarin):  http://www.king-russell.com/www.coumadin.com; https://www.hines.net/www.ahrq.gov/consumer/coumadin.htm;

## 2013-09-22 NOTE — Progress Notes (Signed)
1118 Cardiac Rehab Attempted to ambulate pt. He states that he walked a hour ago. I will follow later. Beatrix Fetters, RN 09/22/2013 11:31 AM

## 2013-09-22 NOTE — Progress Notes (Signed)
Pt ambulated around unit independently. Tolerated very well.

## 2013-09-22 NOTE — Progress Notes (Addendum)
[address]       [place] [address]             [phone]      6 Days Post-Op  Procedure(s) (LRB): CORONARY ARTERY BYPASS GRAFTING (CABG) (N/A) INTRAOPERATIVE TRANSESOPHAGEAL ECHOCARDIOGRAM (N/A) Subjective: Feels ok, some soreness  Objective  Telemetry afib with CVR, occ PVC's  Temp:  [97.3 F (36.3 C)-97.4 F (36.3 C)] 97.4 F (36.3 C) (01/19 0437) Pulse Rate:  [79-98] 79 (01/19 0437) Resp:  [18-20] 18 (01/19 0437) BP: (103-132)/(60-88) 103/60 mmHg (01/19 0437) SpO2:  [98 %-99 %] 98 % (01/19 0437) Weight:  [237 lb 6.4 oz (107.684 kg)] 237 lb 6.4 oz (107.684 kg) (01/19 0437)   Intake/Output Summary (Last 24 hours) at 09/22/13 0732 Last data filed at 09/21/13 1700  Gross per 24 hour  Intake   1200 ml  Output      0 ml  Net   1200 ml       General appearance: alert, cooperative and no distress Heart: irregularly irregular rhythm and + rub Lungs: min dim in bases Abdomen: benign Extremities: + LE edema Wound: incis healing well, minor serodang drainage drom sernal incis  Lab Results:  Recent Labs  09/22/13 0355  NA 135*  K 4.5  CL 95*  CO2 27  GLUCOSE 131*  BUN 20  CREATININE 1.07  CALCIUM 9.7   No results found for this basename: AST, ALT, ALKPHOS, BILITOT, PROT, ALBUMIN,  in the last 72 hours No results found for this basename: LIPASE, AMYLASE,  in the last 72 hours  Recent Labs  09/22/13 0355  WBC 7.7  HGB 12.4*  HCT 35.6*  MCV 89.0  PLT 290   No results found for this basename: CKTOTAL, CKMB, TROPONINI,  in the last 72 hours No components found with this basename: POCBNP,  No results found for this basename: DDIMER,  in the last 72 hours No results found for this basename: HGBA1C,  in the last 72 hours No results found for this basename: CHOL, HDL, LDLCALC, TRIG, CHOLHDL,  in the last 72 hours No results found for this basename: TSH, T4TOTAL, FREET3, T3FREE, THYROIDAB,  in the last 72 hours No results found  for this basename: VITAMINB12, FOLATE, FERRITIN, TIBC, IRON, RETICCTPCT,  in the last 72 hours  Medications: Scheduled . amiodarone  400 mg Oral BID  . aspirin EC  81 mg Oral Daily  . carvedilol  25 mg Oral BID WC  . docusate sodium  200 mg Oral Daily  . enoxaparin (LOVENOX) injection  30 mg Subcutaneous Q12H  . famotidine  20 mg Oral BID  . furosemide  40 mg Oral BID  . glimepiride  4 mg Oral Q breakfast  . guaiFENesin  600 mg Oral BID  . insulin aspart  0-24 Units Subcutaneous TID AC & HS  . insulin detemir  10 Units Subcutaneous BID  . levofloxacin  750 mg Oral Daily  . lisinopril  10 mg Oral Daily  . metFORMIN  1,000 mg Oral BID WC  . multivitamin with minerals  1 tablet Oral Daily  . potassium chloride  20 mEq Oral BID  . simvastatin  20 mg Oral q1800  . sodium chloride  3 mL Intravenous Q12H  . Warfarin - Physician Dosing Inpatient   Does not apply q1800     Radiology/Studies:  No results found.  INR:1.05 Will add last result for INR, ABG once components are confirmed Will add last  4 CBG results once components are confirmed  Assessment/Plan: S/P Procedure(s) (LRB): CORONARY ARTERY BYPASS GRAFTING (CABG) (N/A) INTRAOPERATIVE TRANSESOPHAGEAL ECHOCARDIOGRAM (N/A)  1 Afib with CVR, now on coumadin/lovenox. Will check TSH/magnesium level. K+-4.5 2 Cont lasix for volume overload 3 sugars controlled 105-196 range, will see how he does off insulin at this point 4 BP- better control   LOS: 6 days    [name],Marc E 1/19/20157:32 AM  I have seen and examined the patient and agree with the assessment and plan as outlined.  Cough improved after IV diuresis yesterday.  Looks and feels better although still in rate controlled Afib.  Sternal drainage decreased.  Marc [name] 09/22/2013 8:31 AM

## 2013-09-22 NOTE — Progress Notes (Signed)
CARDIAC REHAB PHASE I   PRE:  Rate/Rhythm: 82 Afib  BP:  Supine:   Sitting: 94/40  Standing:    SaO2: 98 RA  MODE:  Ambulation: 890 ft   POST:  Rate/Rhythm: 104  BP:  Supine:  Sitting: 130/80  Standing:    SaO2: 94 RA 1243-1310 Pt tolerated ambulation well, walked independently. Gait steady. VS stable. Pt. To recliner after walk Put [facility] discharge video for pt to watch.  Melina Copa RN 09/22/2013 1:10 PM

## 2013-09-22 NOTE — Progress Notes (Signed)
Pt ambulated around unit independently. Pt tolerated well.

## 2013-09-23 LAB — GLUCOSE, CAPILLARY
Glucose-Capillary: 124 mg/dL — ABNORMAL HIGH (ref 70–99)
Glucose-Capillary: 118 mg/dL — ABNORMAL HIGH (ref 70–99)
Glucose-Capillary: 181 mg/dL — ABNORMAL HIGH (ref 70–99)
Glucose-Capillary: 104 mg/dL — ABNORMAL HIGH (ref 70–99)

## 2013-09-23 LAB — PROTIME-INR
INR: 1.06 (ref 0.00–1.49)
PROTHROMBIN TIME: 13.6 s (ref 11.6–15.2)

## 2013-09-23 MED ORDER — NICOTINE 21 MG/24HR TD PT24
21.0000 mg | MEDICATED_PATCH | Freq: Every day | TRANSDERMAL | Status: DC
  Administered 2013-09-23 – 2013-09-24 (×2): 21 mg via TRANSDERMAL
  Filled 2013-09-23 (×2): qty 1

## 2013-09-23 MED ORDER — WARFARIN SODIUM 7.5 MG PO TABS
7.5000 mg | ORAL_TABLET | Freq: Once | ORAL | Status: AC
  Administered 2013-09-23: 7.5 mg via ORAL
  Filled 2013-09-23: qty 1

## 2013-09-23 MED ORDER — LISINOPRIL 20 MG PO TABS
20.0000 mg | ORAL_TABLET | Freq: Every day | ORAL | Status: DC
Start: 2013-09-23 — End: 2013-09-24
  Administered 2013-09-23 – 2013-09-24 (×2): 20 mg via ORAL
  Filled 2013-09-23 (×2): qty 1

## 2013-09-23 MED ORDER — ALPRAZOLAM 0.25 MG PO TABS
0.5000 mg | ORAL_TABLET | Freq: Three times a day (TID) | ORAL | Status: DC | PRN
  Administered 2013-09-23: 0.5 mg via ORAL
  Filled 2013-09-23: qty 2

## 2013-09-23 MED ORDER — MAGNESIUM OXIDE 400 (241.3 MG) MG PO TABS
400.0000 mg | ORAL_TABLET | Freq: Two times a day (BID) | ORAL | Status: DC
  Administered 2013-09-23 – 2013-09-24 (×3): 400 mg via ORAL
  Filled 2013-09-23 (×6): qty 1

## 2013-09-23 NOTE — Progress Notes (Signed)
CARDIAC REHAB PHASE I   PRE:  Rate/Rhythm: 88 afib    BP: sitting 90/60    SaO2:   MODE:  Ambulation: 890 ft   POST:  Rate/Rhythm: 92 afib    BP: sitting 116/60     SaO2: 100 RA  Tolerated well. Walking independently. C/o boredom. Wants to go home. [id number]   Elissa Lovett Superior CES, ACSM 09/23/2013 11:14 AM

## 2013-09-23 NOTE — Progress Notes (Signed)
Patient ambulated one time down the hall . Tolerated it well.

## 2013-09-23 NOTE — Progress Notes (Signed)
Patient ambulated inside the room . Patient wants to rest. No concern at this time Maribelle Hopple, Leanord Asal RN BSN

## 2013-09-23 NOTE — Progress Notes (Addendum)
[address]       [place] [id number]             [phone]      7 Days Post-Op  Procedure(s) (LRB): CORONARY ARTERY BYPASS GRAFTING (CABG) (N/A) INTRAOPERATIVE TRANSESOPHAGEAL ECHOCARDIOGRAM (N/A) Subjective: Feels ok,no new c/o  Objective  Telemetry afib with CVR  Temp:  [97.3 F (36.3 C)-98.4 F (36.9 C)] 97.3 F (36.3 C) (01/20 0552) Pulse Rate:  [84-101] 92 (01/20 0552) Resp:  [18-20] 20 (01/20 0552) BP: (94-158)/(70-88) 106/74 mmHg (01/20 0552) SpO2:  [97 %-98 %] 98 % (01/20 0552) Weight:  [236 lb 5.3 oz (107.2 kg)] 236 lb 5.3 oz (107.2 kg) (01/20 0552)   Intake/Output Summary (Last 24 hours) at 09/23/13 0727 Last data filed at 09/22/13 1630  Gross per 24 hour  Intake    600 ml  Output      0 ml  Net    600 ml       General appearance: alert, cooperative and no distress Heart: irregularly irregular rhythm Lungs: clear to auscultation bilaterally Abdomen: benign Extremities: R>L LE edema Wound: no obvious drainage, sternum stable  Lab Results:  Recent Labs  09/22/13 0355 09/22/13 0835  NA 135*  --   K 4.5  --   CL 95*  --   CO2 27  --   GLUCOSE 131*  --   BUN 20  --   CREATININE 1.07  --   CALCIUM 9.7  --   MG  --  1.6   No results found for this basename: AST, ALT, ALKPHOS, BILITOT, PROT, ALBUMIN,  in the last 72 hours No results found for this basename: LIPASE, AMYLASE,  in the last 72 hours  Recent Labs  09/22/13 0355  WBC 7.7  HGB 12.4*  HCT 35.6*  MCV 89.0  PLT 290   No results found for this basename: CKTOTAL, CKMB, TROPONINI,  in the last 72 hours No components found with this basename: POCBNP,  No results found for this basename: DDIMER,  in the last 72 hours No results found for this basename: HGBA1C,  in the last 72 hours No results found for this basename: CHOL, HDL, LDLCALC, TRIG, CHOLHDL,  in the last 72 hours  Recent Labs  09/22/13 0835  TSH 2.472   No results found for this basename:  VITAMINB12, FOLATE, FERRITIN, TIBC, IRON, RETICCTPCT,  in the last 72 hours  Medications: Scheduled . amiodarone  400 mg Oral BID  . aspirin EC  81 mg Oral Daily  . carvedilol  25 mg Oral BID WC  . docusate sodium  200 mg Oral Daily  . enoxaparin (LOVENOX) injection  30 mg Subcutaneous Q12H  . famotidine  20 mg Oral BID  . furosemide  40 mg Oral BID  . glimepiride  4 mg Oral Q breakfast  . guaiFENesin  600 mg Oral BID  . insulin aspart  0-24 Units Subcutaneous TID AC & HS  . levofloxacin  750 mg Oral Daily  . lisinopril  10 mg Oral Daily  . metFORMIN  1,000 mg Oral BID WC  . multivitamin with minerals  1 tablet Oral Daily  . potassium chloride  20 mEq Oral BID  . simvastatin  20 mg Oral q1800  . sodium chloride  3 mL Intravenous Q12H  . Warfarin - Physician Dosing Inpatient   Does not apply q1800     Radiology/Studies:  No results found.  INR:1.06 Will add last result  for INR, ABG once components are confirmed Will add last 4 CBG results once components are confirmed  Assessment/Plan: S/P Procedure(s) (LRB): CORONARY ARTERY BYPASS GRAFTING (CABG) (N/A) INTRAOPERATIVE TRANSESOPHAGEAL ECHOCARDIOGRAM (N/A)  1 conts to feel well 2 afib with CVR, TSH normal, Mg a bit low , will give some po. INR unchanged- will give 7.5 mg today 3 BP up at times, will increase lisinopril 4 day 5 of levaquin- sternal incis looks good- will d/c    LOS: 7 days    GOLD,WAYNE E 1/20/20157:27 AM   Chart reviewed, patient examined, agree with above. He has been in rate controlled a-fib since Saturday on po amio. Will keep on amio and hopefully he will convert over the next few weeks. He will need coumadin which has been started. He can go home tomorrow but will ned to decide on coumadin dose.

## 2013-09-23 NOTE — Progress Notes (Signed)
   CARE MANAGEMENT NOTE 09/23/2013  Patient:  Marc [name], Marc [name]   Account Number:  [id number]  Date Initiated:  09/17/2013  Documentation initiated by:  MAYO,HENRIETTA  Subjective/Objective Assessment:   adm s/p CABG x 4     Action/Plan:   discahrge planning   Anticipated DC Date:     Anticipated DC Plan:        DC Planning Services  CM consult  MATCH Program      Choice offered to / List presented to:             Status of service:  Completed, signed off Medicare Important Message given?   (If response is "NO", the following Medicare IM given date fields will be blank) Date Medicare IM given:   Date Additional Medicare IM given:    Discharge Disposition:    Per UR Regulation:  Reviewed for med. necessity/level of care/duration of stay  If discussed at Long Length of Stay Meetings, dates discussed:    Comments:  09/23/13 11:00 CM spoke with pt in room to discuss PCP and lack of insurance.  Pt states he uses the [facility] for his primary care .  Pt also states Sudie Bailey is in the process of filing for insurance/disability. Pt does states he will have difficulty getting his prescriptions.  MATCH letter and list of participating pharmacies was given to pt and parameters of the program explained.  Pt verbalized understanding.   CM gave pt a Legal Aide of [employer])  handout with [employer] hotline number in case he does not get desired or timely results from [facility].  Pt understands [facility] is a free services and will navigate the Open Enrollment website with him.  Freddy Jaksch, BSN, CM [phone].

## 2013-09-23 NOTE — Progress Notes (Signed)
Nurse informed me that patient has threatened to leave AMA. I spoke with patient and he is anxious and ""craving nicotine". I spoke with Dr. Laneta Simmers and I have increased the patient's Xanax to 0.5 tid PRN and have prescribed a Nicotine patch. Patient is willing to stay today. Hopefully, his INR will be increased and he can be discharged in the morning.

## 2013-09-24 LAB — GLUCOSE, CAPILLARY
GLUCOSE-CAPILLARY: 149 mg/dL — AB (ref 70–99)
Glucose-Capillary: 131 mg/dL — ABNORMAL HIGH (ref 70–99)

## 2013-09-24 LAB — PROTIME-INR
INR: 1.12 (ref 0.00–1.49)
PROTHROMBIN TIME: 14.2 s (ref 11.6–15.2)

## 2013-09-24 MED ORDER — ASPIRIN 81 MG PO TBEC: 81.0000 mg | DELAYED_RELEASE_TABLET | Freq: Every day | ORAL | Status: DC

## 2013-09-24 MED ORDER — NICOTINE 21 MG/24HR TD PT24: 21.0000 mg | MEDICATED_PATCH | Freq: Every day | TRANSDERMAL | Status: DC

## 2013-09-24 MED ORDER — SIMVASTATIN 20 MG PO TABS: 20.0000 mg | ORAL_TABLET | Freq: Every day | ORAL | Status: DC

## 2013-09-24 MED ORDER — LISINOPRIL 20 MG PO TABS: 20.0000 mg | ORAL_TABLET | Freq: Every day | ORAL | Status: DC

## 2013-09-24 MED ORDER — WARFARIN SODIUM 5 MG PO TABS: 5.0000 mg | ORAL_TABLET | Freq: Every day | ORAL | Status: DC

## 2013-09-24 MED ORDER — AMIODARONE HCL 400 MG PO TABS: 200.0000 mg | ORAL_TABLET | Freq: Two times a day (BID) | ORAL | Status: DC

## 2013-09-24 MED ORDER — POTASSIUM CHLORIDE CRYS ER 20 MEQ PO TBCR: 20.0000 meq | EXTENDED_RELEASE_TABLET | Freq: Every day | ORAL | Status: DC

## 2013-09-24 MED ORDER — OXYCODONE HCL 5 MG PO TABS: 5.0000 mg | ORAL_TABLET | ORAL | Status: DC | PRN

## 2013-09-24 MED ORDER — FUROSEMIDE 40 MG PO TABS: 40.0000 mg | ORAL_TABLET | Freq: Every day | ORAL | Status: DC

## 2013-09-24 MED ORDER — GLIMEPIRIDE 4 MG PO TABS: 4.0000 mg | ORAL_TABLET | Freq: Every day | ORAL | Status: DC

## 2013-09-24 NOTE — Progress Notes (Signed)
09/24/2013 2:39 PM Nursing note Discharge avs form, medications already taken today and those due this evening given and explained to patient. Follow up appointments, INR checks, incision site care, when to call MD and activity restrictions reviewed. D/c iv line. D/c tele. D/c home per orders. PT. BP better at 122/80. Verbal order Gershon Crane PAC ok to d/c home.  Sharanya Templin, Blanchard Kelch

## 2013-09-24 NOTE — Progress Notes (Signed)
09/24/2013 11115 Epw and CTS d/c per orders and per protocol. Ends intact on pacing wires. Benzoin and steri strips applied to CTS sites. Pt. Tolerated well. Vital signs collected per protocol and pt. Advised of bedrest for one hour post removal. Call bell within reach. Will continue to monitor patient.  Daymein Nunnery, Blanchard Kelch

## 2013-09-24 NOTE — Progress Notes (Signed)
[id number] Cardiac Rehab Reviewed discharge education with pt. He voices understanding. We will referral him to Outpt. CRP in [place]. Beatrix Fetters, RN 09/24/2013 2:22 PM

## 2013-09-24 NOTE — Progress Notes (Signed)
09/24/2013 1130 Pt. BP noted at 15 min post EPW removal to be at 87/54. Pt. Asymptomatic. HR 74 Afib oxygen level 94% RA. Dr. Laneta Simmers paged and made aware. No new orders received at this time only to continue to monitor patient. Orders enacted and pt.updated on plan of care.  Marquasia Schmieder, Blanchard Kelch

## 2013-09-24 NOTE — Progress Notes (Addendum)
[address]       [place] [id number]             [phone]      8 Days Post-Op  Procedure(s) (LRB): CORONARY ARTERY BYPASS GRAFTING (CABG) (N/A) INTRAOPERATIVE TRANSESOPHAGEAL ECHOCARDIOGRAM (N/A) Subjective:  conts to feel well  Telemetry afib with CVR  Temp:  [97.6 F (36.4 C)-98.4 F (36.9 C)] 97.6 F (36.4 C) (01/21 0745) Pulse Rate:  [72-90] 84 (01/21 0745) Resp:  [18-20] 19 (01/21 0745) BP: (92-120)/(56-83) 118/80 mmHg (01/21 0745) SpO2:  [95 %-100 %] 98 % (01/21 0745) Weight:  [235 lb 1.6 oz (106.641 kg)] 235 lb 1.6 oz (106.641 kg) (01/21 0536)   Intake/Output Summary (Last 24 hours) at 09/24/13 0859 Last data filed at 09/24/13 0844  Gross per 24 hour  Intake    960 ml  Output      0 ml  Net    960 ml       General appearance: alert, cooperative and no distress Heart: irregularly irregular rhythm Lungs: clear to auscultation bilaterally Abdomen: soft, non-tender; bowel sounds normal; no masses,  no organomegaly Extremities: mild edema Wound: incis healing well  Lab Results:  Recent Labs  09/22/13 0355 09/22/13 0835  NA 135*  --   K 4.5  --   CL 95*  --   CO2 27  --   GLUCOSE 131*  --   BUN 20  --   CREATININE 1.07  --   CALCIUM 9.7  --   MG  --  1.6   No results found for this basename: AST, ALT, ALKPHOS, BILITOT, PROT, ALBUMIN,  in the last 72 hours No results found for this basename: LIPASE, AMYLASE,  in the last 72 hours  Recent Labs  09/22/13 0355  WBC 7.7  HGB 12.4*  HCT 35.6*  MCV 89.0  PLT 290   No results found for this basename: CKTOTAL, CKMB, TROPONINI,  in the last 72 hours No components found with this basename: POCBNP,  No results found for this basename: DDIMER,  in the last 72 hours No results found for this basename: HGBA1C,  in the last 72 hours No results found for this basename: CHOL, HDL, LDLCALC, TRIG, CHOLHDL,  in the last 72 hours  Recent Labs  09/22/13 0835  TSH 2.472   No  results found for this basename: VITAMINB12, FOLATE, FERRITIN, TIBC, IRON, RETICCTPCT,  in the last 72 hours  Medications: Scheduled . amiodarone  400 mg Oral BID  . aspirin EC  81 mg Oral Daily  . carvedilol  25 mg Oral BID WC  . docusate sodium  200 mg Oral Daily  . enoxaparin (LOVENOX) injection  30 mg Subcutaneous Q12H  . famotidine  20 mg Oral BID  . furosemide  40 mg Oral BID  . glimepiride  4 mg Oral Q breakfast  . guaiFENesin  600 mg Oral BID  . insulin aspart  0-24 Units Subcutaneous TID AC & HS  . lisinopril  20 mg Oral Daily  . magnesium oxide  400 mg Oral BID  . metFORMIN  1,000 mg Oral BID WC  . multivitamin with minerals  1 tablet Oral Daily  . nicotine  21 mg Transdermal Daily  . potassium chloride  20 mEq Oral BID  . simvastatin  20 mg Oral q1800  . sodium chloride  3 mL Intravenous Q12H  . Warfarin - Physician Dosing Inpatient   Does not apply [phone]  Radiology/Studies:  No results found.  INR: Will add last result for INR, ABG once components are confirmed Will add last 4 CBG results once components are confirmed  Assessment/Plan: S/P Procedure(s) (LRB): CORONARY ARTERY BYPASS GRAFTING (CABG) (N/A) INTRAOPERATIVE TRANSESOPHAGEAL ECHOCARDIOGRAM (N/A) Plan for discharge: see discharge orders   LOS: 8 days    GOLD,WAYNE E 1/21/20158:59 AM   Chart reviewed, patient examined, agree with above. He looks and feels well in rate-controlled A-fib. His INR is not therapeutic yet but I think he can go home on Coumadin 7.5 today, then 5 mg daily with INR check Friday.

## 2013-09-26 ENCOUNTER — Ambulatory Visit (INDEPENDENT_AMBULATORY_CARE_PROVIDER_SITE_OTHER): Payer: Self-pay

## 2013-09-26 DIAGNOSIS — I4891 Unspecified atrial fibrillation: Secondary | ICD-10-CM

## 2013-09-26 DIAGNOSIS — Z5181 Encounter for therapeutic drug level monitoring: Secondary | ICD-10-CM | POA: Insufficient documentation

## 2013-09-26 LAB — POCT INR: INR: 1.4

## 2013-09-28 ENCOUNTER — Emergency Department: Payer: Self-pay | Admitting: Internal Medicine

## 2013-09-28 LAB — CBC
HCT: 35.2 % — ABNORMAL LOW (ref 40.0–52.0)
HGB: 12.1 g/dL — AB (ref 13.0–18.0)
MCH: 31.4 pg (ref 26.0–34.0)
MCHC: 34.4 g/dL (ref 32.0–36.0)
MCV: 91 fL (ref 80–100)
PLATELETS: 434 10*3/uL (ref 150–440)
RBC: 3.85 10*6/uL — ABNORMAL LOW (ref 4.40–5.90)
RDW: 13.5 % (ref 11.5–14.5)
WBC: 9.2 10*3/uL (ref 3.8–10.6)

## 2013-09-28 LAB — BASIC METABOLIC PANEL
ANION GAP: 5 — AB (ref 7–16)
BUN: 21 mg/dL — ABNORMAL HIGH (ref 7–18)
CALCIUM: 9.5 mg/dL (ref 8.5–10.1)
CO2: 29 mmol/L (ref 21–32)
Chloride: 97 mmol/L — ABNORMAL LOW (ref 98–107)
Creatinine: 1.11 mg/dL (ref 0.60–1.30)
EGFR (African American): >60
EGFR (Non-African Amer.): >60
GLUCOSE: 95 mg/dL (ref 65–99)
Osmolality: 265 (ref 275–301)
POTASSIUM: 4.2 mmol/L (ref 3.5–5.1)
SODIUM: 131 mmol/L — AB (ref 136–145)

## 2013-09-28 LAB — TROPONIN I: TROPONIN-I: 0.03 ng/mL

## 2013-10-01 ENCOUNTER — Ambulatory Visit (INDEPENDENT_AMBULATORY_CARE_PROVIDER_SITE_OTHER): Payer: Self-pay

## 2013-10-01 DIAGNOSIS — Z5181 Encounter for therapeutic drug level monitoring: Secondary | ICD-10-CM

## 2013-10-01 DIAGNOSIS — I4891 Unspecified atrial fibrillation: Secondary | ICD-10-CM

## 2013-10-01 LAB — POCT INR: INR: 2

## 2013-10-01 MED ORDER — CARVEDILOL 25 MG PO TABS: 25.0000 mg | ORAL_TABLET | Freq: Two times a day (BID) | ORAL | Status: DC

## 2013-10-02 ENCOUNTER — Ambulatory Visit (INDEPENDENT_AMBULATORY_CARE_PROVIDER_SITE_OTHER): Payer: Self-pay | Admitting: Cardiovascular Disease

## 2013-10-02 ENCOUNTER — Encounter: Payer: Self-pay | Admitting: Cardiovascular Disease

## 2013-10-02 VITALS — BP 140/88 | HR 84 | Ht 73.0 in | Wt 235.8 lb

## 2013-10-02 DIAGNOSIS — I2510 Atherosclerotic heart disease of native coronary artery without angina pectoris: Secondary | ICD-10-CM

## 2013-10-02 DIAGNOSIS — I5022 Chronic systolic (congestive) heart failure: Secondary | ICD-10-CM

## 2013-10-02 DIAGNOSIS — E785 Hyperlipidemia, unspecified: Secondary | ICD-10-CM

## 2013-10-02 DIAGNOSIS — I4891 Unspecified atrial fibrillation: Secondary | ICD-10-CM

## 2013-10-02 DIAGNOSIS — I10 Essential (primary) hypertension: Secondary | ICD-10-CM

## 2013-10-02 MED ORDER — POTASSIUM CHLORIDE CRYS ER 20 MEQ PO TBCR
20.0000 meq | EXTENDED_RELEASE_TABLET | Freq: Every day | ORAL | Status: DC
Start: 2013-10-02 — End: 2016-03-02

## 2013-10-02 MED ORDER — FUROSEMIDE 40 MG PO TABS: 40.0000 mg | ORAL_TABLET | Freq: Every day | ORAL | Status: DC

## 2013-10-02 NOTE — Progress Notes (Signed)
HPI  This is a 53 year old male who is here for a followup visit regarding coronary artery disease, atrial fibrillation and heart failure. He has chronic medical conditions that include chronic systolic heart failure, atrial fibrillation, type 2 diabetes, hypertension, hyperlipidemia, tobacco use and obesity. In late 2014 ,he was diagnosed with acute systolic heart failure in the setting of uncontrolled hypertension. His troponin was mildly elevated with a BNP of 1900. Blood pressure on presentation was 225/115. Shortly after hospital admission, he developed atrial fibrillation with rapid ventricular response with a heart rate of 150 beats per minute. He improved gradually with medical therapy. Echocardiogram showed an ejection fraction of 45-50% with inferior apical hypokinesis. Previous cardiac catheterization in 1998 at [facility] showed significant two-vessel coronary artery disease with 75% in proximal RCA and 95% in the mid RCA (nondominant RCA) , 95% into OM 2 and 50% stenosis in mid LAD. Ejection fraction was normal.  He underwent cardiac catheterization in December of 2014 which showed significant left main stenosis and severe three-vessel coronary artery disease. He underwent CABG at [facility] by Dr. Laneta Simmers early this month without complications. He was discharged home on warfarin for atrial fibrillation. He has been doing very well and denies any chest pain or dyspnea. He does have mild right lower extremity edema.  No Known Allergies   Current Outpatient Prescriptions on File Prior to Visit  Medication Sig Dispense Refill  . ALPRAZolam (XANAX) 0.25 MG tablet Take 0.25 mg by mouth at bedtime as needed for anxiety.      Marland Kitchen amiodarone (PACERONE) 400 MG tablet Take 0.5 tablets (200 mg total) by mouth 2 (two) times daily.  60 tablet  1  . carvedilol (COREG) 25 MG tablet Take 1 tablet (25 mg total) by mouth 2 (two) times daily.  60 tablet  6  . furosemide (LASIX) 40 MG tablet Take 1 tablet (40 mg  total) by mouth daily.  10 tablet  0  . glimepiride (AMARYL) 4 MG tablet Take 1 tablet (4 mg total) by mouth daily with breakfast.  30 tablet  1  . lisinopril (PRINIVIL,ZESTRIL) 20 MG tablet Take 1 tablet (20 mg total) by mouth daily.  30 tablet  1  . metFORMIN (GLUCOPHAGE) 1000 MG tablet Take 1 tablet (1,000 mg total) by mouth 2 (two) times daily with a meal.  60 tablet  6  . Multiple Vitamin (MULTIVITAMIN WITH MINERALS) TABS tablet Take 1 tablet by mouth daily.      . nicotine (NICODERM CQ - DOSED IN MG/24 HOURS) 21 mg/24hr patch Place 1 patch (21 mg total) onto the skin daily.  28 patch  0  . oxyCODONE (OXY IR/ROXICODONE) 5 MG immediate release tablet Take 1-2 tablets (5-10 mg total) by mouth every 4 (four) hours as needed for severe pain.  50 tablet  0  . potassium chloride SA (K-DUR,KLOR-CON) 20 MEQ tablet Take 1 tablet (20 mEq total) by mouth daily.  10 tablet  0  . simvastatin (ZOCOR) 20 MG tablet Take 1 tablet (20 mg total) by mouth daily at 6 PM.  30 tablet  1  . warfarin (COUMADIN) 5 MG tablet Take 1 tablet (5 mg total) by mouth daily.  100 tablet  1   No current facility-administered medications on file prior to visit.     Past Medical History  Diagnosis Date  . Diabetes mellitus without complication   . Tobacco abuse   . Obesity   . Cardiomyopathy     45-50%  .  CHF (congestive heart failure)   . Anxiety   . Medical non-compliance   . Chronic systolic heart failure 07/2013    Ejection fraction of 45-50% with possible inferior wall hypokinesis  . Hypertension   . Hyperlipidemia   . MI (myocardial infarction)   . Coronary artery disease   . Dysrhythmia      Past Surgical History  Procedure Laterality Date  . [facility]  . Knee surgery Right   . Lasik Bilateral   . Coronary artery bypass graft N/A 09/16/2013    Procedure: CORONARY ARTERY BYPASS GRAFTING (CABG);  Surgeon: Alleen BorneBryan K Bartle, MD;  [facility];  Service: Open Heart  Surgery;  Laterality: N/A;  CABG x four, using left internal mammary artery and right leg greater saphenous vein  . Intraoperative transesophageal echocardiogram N/A 09/16/2013    Procedure: INTRAOPERATIVE TRANSESOPHAGEAL ECHOCARDIOGRAM;  Surgeon: Alleen BorneBryan K Bartle, MD;  [facility];  Service: Open Heart Surgery;  Laterality: N/A;     Family History  Problem Relation Age of Onset  . Heart disease Mother      History   Social History  . Marital Status: Divorced    Spouse Name: N/A    Number of Children: N/A  . Years of Education: N/A   Occupational History  . Not on file.   Social History Main Topics  . Smoking status: Current Every Day Smoker -- 1.00 packs/day for 45 years    Types: Cigarettes  . Smokeless tobacco: Never Used  . Alcohol Use: No     Comment: occassional  . Drug Use: No  . Sexual Activity: Not on file   Other Topics Concern  . Not on file   Social History Narrative  . No narrative on file     PHYSICAL EXAM   BP 140/88  Pulse 84  Ht 6\' 1"  (1.854 m)  Wt 235 lb 12 oz (106.935 kg)  BMI 31.11 kg/m2 Constitutional: He is oriented to person, place, and time. He appears well-developed and well-nourished. No distress.  HENT: No nasal discharge.  Head: Normocephalic and atraumatic.  Eyes: Pupils are equal and round.  No discharge. Neck: Normal range of motion. Neck supple. No JVD present. No thyromegaly present.  Cardiovascular: Normal rate, irregular rhythm, normal heart sounds. Exam reveals no gallop and no friction rub. No murmur heard.  Pulmonary/Chest: Effort normal and breath sounds normal. No stridor. No respiratory distress. He has no wheezes. He has no rales. He exhibits no tenderness.  Abdominal: Soft. Bowel sounds are normal. He exhibits no distension. There is no tenderness. There is no rebound and no guarding.  Musculoskeletal: Normal range of motion. He exhibits mild edema on the right side and no tenderness.  Neurological: He is alert and  oriented to person, place, and time. Coordination normal.  Skin: Skin is warm and dry. No rash noted. He is not diaphoretic. No erythema. No pallor.  Psychiatric: He has a normal mood and affect. His behavior is normal. Judgment and thought content normal.       EKG: Atrial fibrillation  -irregular conduction  Right bundle branch block Low voltage in precordial leads.   -Nonspecific ST depression     ABNORMAL    ASSESSMENT AND PLAN

## 2013-10-02 NOTE — Assessment & Plan Note (Signed)
Blood pressure is reasonably controlled on current medications. 

## 2013-10-02 NOTE — Assessment & Plan Note (Signed)
LV systolic function was mildly reduced. I recommend continuing current dose of Lasix and potassium.

## 2013-10-02 NOTE — Assessment & Plan Note (Signed)
He is doing very well after her recent CABG. He denies any symptoms suggestive of angina.

## 2013-10-02 NOTE — Patient Instructions (Addendum)
Check with [place] [facility] Assistance program about Xarelto.   Continue same medications.   Follow up in 1 month.

## 2013-10-02 NOTE — Assessment & Plan Note (Signed)
He will need a followup lipid and liver profile in the near future.

## 2013-10-02 NOTE — Assessment & Plan Note (Signed)
He continues to be in atrial fibrillation but ventricular rate is controlled. His atrial fibrillation might be chronic. He is currently on amiodarone. If he does not convert by next month, I will consider stopping amiodarone and continuing with rate control and anticoagulation. Cardioversion as an option. However, he does not seem to be symptomatic. We also have been trying to get him Xarelto through the assistance program.

## 2013-10-06 ENCOUNTER — Other Ambulatory Visit: Payer: Self-pay | Admitting: *Deleted

## 2013-10-06 DIAGNOSIS — I2510 Atherosclerotic heart disease of native coronary artery without angina pectoris: Secondary | ICD-10-CM

## 2013-10-08 ENCOUNTER — Encounter: Payer: Self-pay | Admitting: Surgery

## 2013-10-08 ENCOUNTER — Ambulatory Visit
Admission: RE | Admit: 2013-10-08 | Discharge: 2013-10-08 | Disposition: A | Discharge status: Home / Self Care | Payer: Medicaid Other | Source: Ambulatory Visit | Attending: Surgery | Admitting: Surgery

## 2013-10-08 ENCOUNTER — Ambulatory Visit (INDEPENDENT_AMBULATORY_CARE_PROVIDER_SITE_OTHER): Payer: No Typology Code available for payment source | Admitting: *Deleted

## 2013-10-08 ENCOUNTER — Ambulatory Visit (INDEPENDENT_AMBULATORY_CARE_PROVIDER_SITE_OTHER): Payer: Self-pay | Admitting: Surgery

## 2013-10-08 VITALS — BP 137/87 | HR 67 | Resp 20 | Ht 73.0 in | Wt 235.0 lb

## 2013-10-08 DIAGNOSIS — Z951 Presence of aortocoronary bypass graft: Secondary | ICD-10-CM

## 2013-10-08 DIAGNOSIS — Z5181 Encounter for therapeutic drug level monitoring: Secondary | ICD-10-CM

## 2013-10-08 DIAGNOSIS — I2510 Atherosclerotic heart disease of native coronary artery without angina pectoris: Secondary | ICD-10-CM

## 2013-10-08 DIAGNOSIS — I4891 Unspecified atrial fibrillation: Secondary | ICD-10-CM

## 2013-10-08 LAB — POCT INR: INR: 1.7

## 2013-10-08 MED ORDER — OXYCODONE-ACETAMINOPHEN 5-325 MG PO TABS: 1.0000 | ORAL_TABLET | Freq: Three times a day (TID) | ORAL | Status: DC | PRN

## 2013-10-08 NOTE — Progress Notes (Signed)
HPI:  Patient returns for routine postoperative follow-up having undergone CABG x 4  on 09/17/2013. The patient's early postoperative recovery while in the hospital was notable for postop atrial fibrillation converted with amiodarone. Since hospital discharge the patient reports that he has been feeling well. He is walking without chest pain or shortness of breath. He is still smoking but has cut the amount down. He has had no tachypalpitations or irregular rhythm that he can tell.   Current Outpatient Prescriptions  Medication Sig Dispense Refill  . ALPRAZolam (XANAX) 0.25 MG tablet Take 0.25 mg by mouth at bedtime as needed for anxiety.      Marland Kitchen. amiodarone (PACERONE) 400 MG tablet Take 0.5 tablets (200 mg total) by mouth 2 (two) times daily.  60 tablet  1  . carvedilol (COREG) 25 MG tablet Take 1 tablet (25 mg total) by mouth 2 (two) times daily.  60 tablet  6  . furosemide (LASIX) 40 MG tablet Take 1 tablet (40 mg total) by mouth daily.  30 tablet  3  . glimepiride (AMARYL) 4 MG tablet Take 1 tablet (4 mg total) by mouth daily with breakfast.  30 tablet  1  . lisinopril (PRINIVIL,ZESTRIL) 20 MG tablet Take 1 tablet (20 mg total) by mouth daily.  30 tablet  1  . metFORMIN (GLUCOPHAGE) 1000 MG tablet Take 1 tablet (1,000 mg total) by mouth 2 (two) times daily with a meal.  60 tablet  6  . Multiple Vitamin (MULTIVITAMIN WITH MINERALS) TABS tablet Take 1 tablet by mouth daily.      . nicotine (NICODERM CQ - DOSED IN MG/24 HOURS) 21 mg/24hr patch Place 1 patch (21 mg total) onto the skin daily.  28 patch  0  . oxyCODONE (OXY IR/ROXICODONE) 5 MG immediate release tablet Take 1-2 tablets (5-10 mg total) by mouth every 4 (four) hours as needed for severe pain.  50 tablet  0  . potassium chloride SA (K-DUR,KLOR-CON) 20 MEQ tablet Take 1 tablet (20 mEq total) by mouth daily.  30 tablet  3  . simvastatin (ZOCOR) 20 MG tablet Take 1 tablet (20 mg total) by mouth daily at 6 PM.  30 tablet  1  .  warfarin (COUMADIN) 5 MG tablet Take 1 tablet (5 mg total) by mouth daily.  100 tablet  1   No current facility-administered medications for this visit.    Physical Exam: BP 137/87  Pulse 67  Resp 20  Ht 6\' 1"  (1.854 m)  Wt 235 lb (106.595 kg)  BMI 31.01 kg/m2  SpO2 98% He looks well. Lung exam is clear. Cardiac exam shows a regular rate and rhythm with normal heart sounds. Chest incision is healing well and sternum is stable. The leg incisions are healing well and there is no peripheral edema.    Diagnostic Tests:  CLINICAL DATA: Status post CABG 3 weeks ago  EXAM:  CHEST 2 VIEW  COMPARISON: PA and lateral chest of September 28, 2012  FINDINGS:  The lungs are well-expanded. There is no focal infiltrate. There is  no evidence of pulmonary interstitial edema. The pleural effusion on  the left has nearly totally resolved. The cardiopericardial  silhouette remains mildly enlarged. The pulmonary vascularity is not  engorged. There is tortuosity of the descending thoracic aorta. Four  intact sternal wires are demonstrated. No acute abnormality of the  bony thorax is demonstrated.  IMPRESSION:  There is mild stable enlargement of the cardiac silhouette. There is  no evidence  of pulmonary edema and the left pleural effusion has  nearly totally resolved.  Electronically Signed  By: David Swaziland  On: 10/08/2013 10:45   Impression:  Overall I think he is doing well. I encouraged him to continue walking.  I told him he could drive his car but should not lift anything heavier than 10 lbs for three months postop. I strongly encouraged him to stop smoking. He appears to be maintaining sinus rhythm. Cardiology will decide when to stop his amiodarone and coumadin.   Plan:  He will continue to follow-up with Dr. Kirke Corin and will return to see me if he has any problems with his incisions.

## 2013-10-15 ENCOUNTER — Ambulatory Visit (INDEPENDENT_AMBULATORY_CARE_PROVIDER_SITE_OTHER): Payer: Self-pay

## 2013-10-15 DIAGNOSIS — Z5181 Encounter for therapeutic drug level monitoring: Secondary | ICD-10-CM

## 2013-10-15 DIAGNOSIS — I4891 Unspecified atrial fibrillation: Secondary | ICD-10-CM

## 2013-10-15 LAB — POCT INR: INR: 3.3

## 2013-11-06 ENCOUNTER — Ambulatory Visit: Payer: Self-pay | Admitting: Cardiovascular Disease

## 2013-11-19 ENCOUNTER — Telehealth: Payer: Self-pay

## 2013-11-19 MED ORDER — WARFARIN SODIUM 5 MG PO TABS: ORAL_TABLET | ORAL | Status: DC

## 2013-11-19 NOTE — Telephone Encounter (Signed)
Pt returned call to clinic.  Pt states he missed his last f/u appt secondary to bad weather, and then ran out of Coumadin and refills and has not taken in approx 2 weeks.  Counseled pt on importance of medication compliance especially with Coumadin because not taking puts pt at an increased risk of clot, stroke and even death.  Advised pt I would send in a 30 day refill and for him to resume his Coumadin at his previous dosage 5mg  daily except 7.5mg  on Wednesdays and made an appt to recheck his INR in 1 week in clinic after restart.  Pt verbalizes understanding.

## 2013-11-19 NOTE — Telephone Encounter (Signed)
Pt is overdue for Coumadin follow-up.  Attempted to contact pt, unable to leave VM mailbox is full.  Mailed delinquent Coumadin letter to pt's home address.

## 2013-11-19 NOTE — Addendum Note (Signed)
Addended by: Migdalia Dk on: 11/19/2013 10:23 AM   Modules accepted: Orders

## 2013-11-26 ENCOUNTER — Telehealth: Payer: Self-pay

## 2013-11-26 NOTE — Telephone Encounter (Signed)
Request from Disability Determination Services , sent to [employer] on 11/27/2013 .  

## 2013-12-04 ENCOUNTER — Emergency Department: Payer: Self-pay | Admitting: Emergency Medicine

## 2013-12-04 LAB — BASIC METABOLIC PANEL
Anion Gap: 6 — ABNORMAL LOW (ref 7–16)
BUN: 18 mg/dL (ref 7–18)
CHLORIDE: 99 mmol/L (ref 98–107)
CREATININE: 1.04 mg/dL (ref 0.60–1.30)
Calcium, Total: 9 mg/dL (ref 8.5–10.1)
Co2: 25 mmol/L (ref 21–32)
EGFR (African American): >60
EGFR (Non-African Amer.): >60
Glucose: 414 mg/dL — ABNORMAL HIGH (ref 65–99)
Osmolality: 280 (ref 275–301)
POTASSIUM: 4.2 mmol/L (ref 3.5–5.1)
SODIUM: 130 mmol/L — AB (ref 136–145)

## 2013-12-04 LAB — CBC
HCT: 42.8 % (ref 40.0–52.0)
HGB: 14.2 g/dL (ref 13.0–18.0)
MCH: 30.2 pg (ref 26.0–34.0)
MCHC: 33.2 g/dL (ref 32.0–36.0)
MCV: 91 fL (ref 80–100)
PLATELETS: 196 10*3/uL (ref 150–440)
RBC: 4.71 10*6/uL (ref 4.40–5.90)
RDW: 15 % — AB (ref 11.5–14.5)
WBC: 6.8 10*3/uL (ref 3.8–10.6)

## 2013-12-16 ENCOUNTER — Telehealth: Payer: Self-pay

## 2013-12-16 NOTE — Telephone Encounter (Signed)
Mailed Release packet to patient for disability forms. Attempted to call to inform, mailbox full.

## 2013-12-16 NOTE — Telephone Encounter (Signed)
Request from Disability Determination Services , sent to [employer] on 12/16/2013 .

## 2014-03-31 IMAGING — CR DG CHEST 1V PORT
1 series · 1 of 1 positions shown · non-contrast
Comparison: PA and lateral chest 09/12/2013.

CLINICAL DATA: Status post cardiac surgery.  NG tube placement.

EXAM:
PORTABLE CHEST - 1 VIEW

[AP]
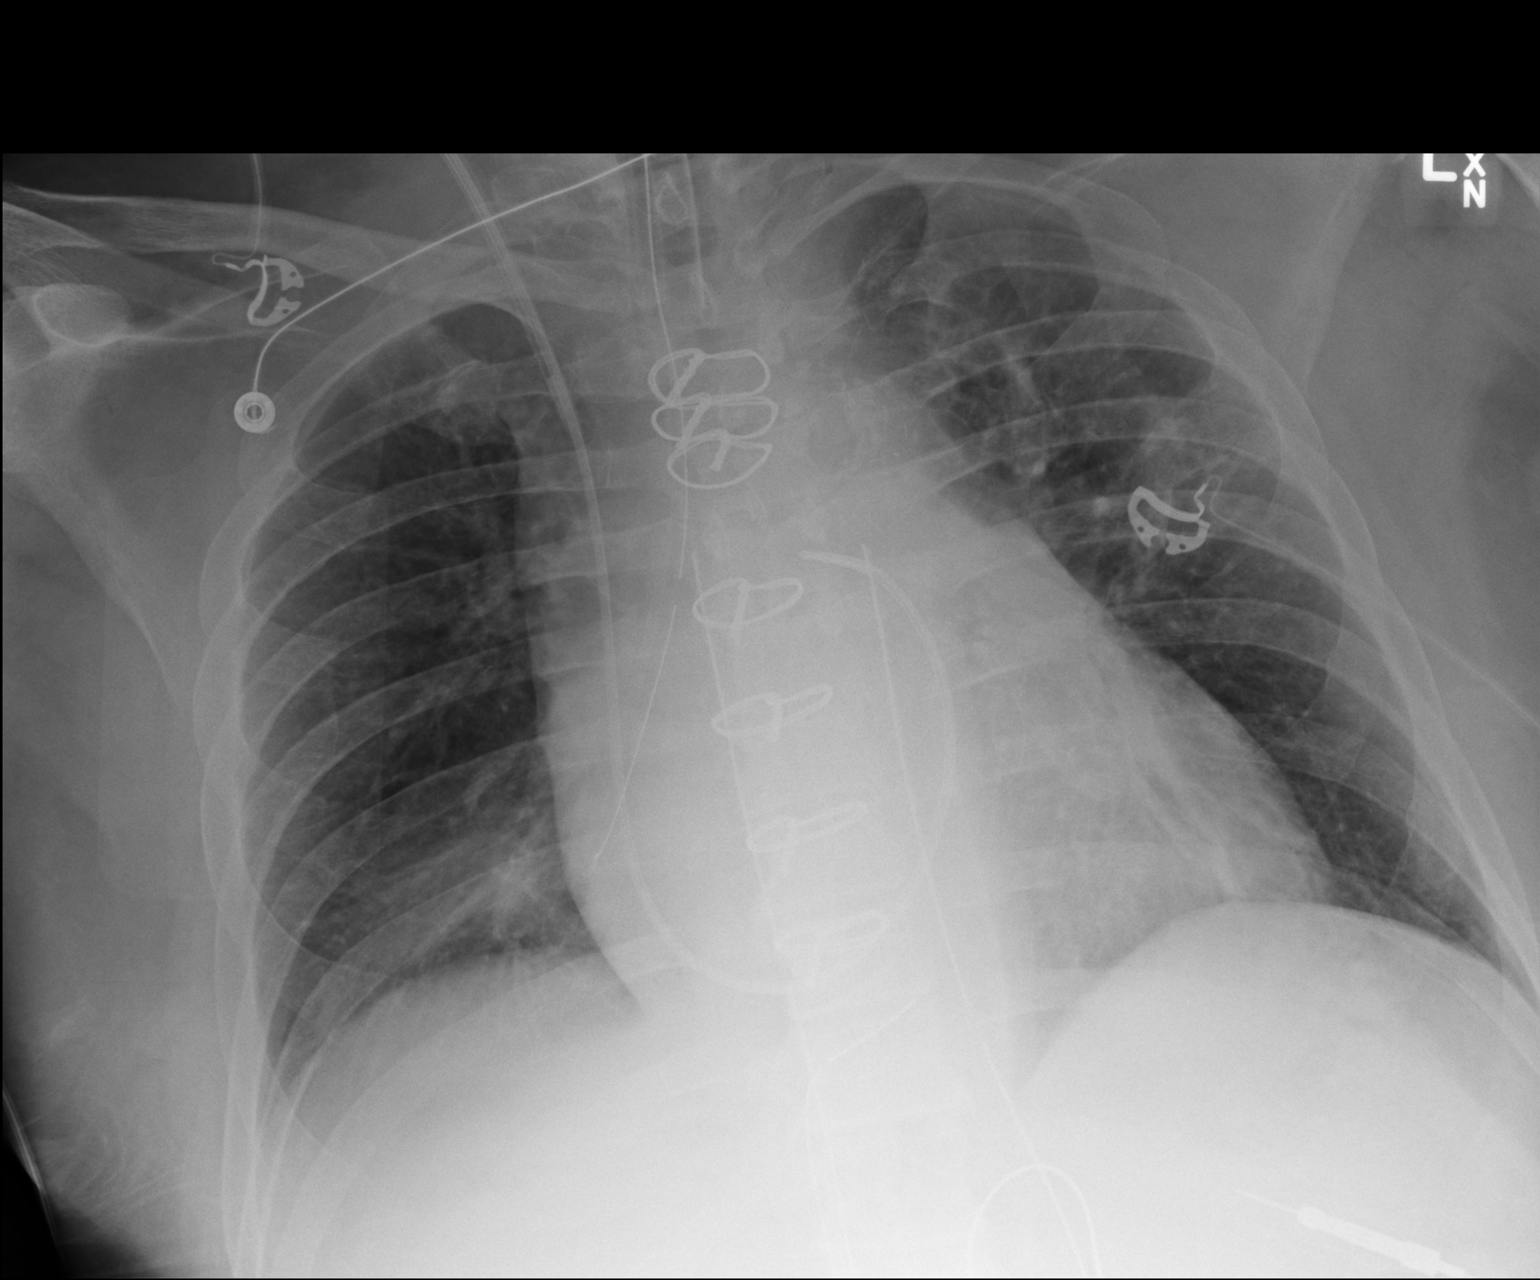

[1 of 1 positions shown; findings below may reference images not displayed]

FINDINGS: The patient's NG tube is in the distal right mainstem bronchus. ET
tube is in good position with the tip at the level of the clavicular
heads. Mediastinal drain is identified. Right IJ approach Swan-Ganz
catheter is in place. The tip of the catheter is in the proximal
right main pulmonary artery. Mild atelectasis is seen in the right
base. There is cardiomegaly without edema. No pneumothorax.
IMPRESSION: NG tube tip is in the distal right mainstem bronchus. Support
apparatus is otherwise in good position.

Cardiomegaly without edema.

Findings were called to the patient's nurse, Sayuri, in the medical
intensive care unit at the time interpretation.

## 2014-04-02 IMAGING — CR DG CHEST 1V PORT
1 series · 1 of 1 positions shown · non-contrast
Comparison: Portable exam 5004 hr compared to 09/17/2013

CLINICAL DATA: Post cardiac surgery

EXAM:
PORTABLE CHEST - 1 VIEW

[AP]
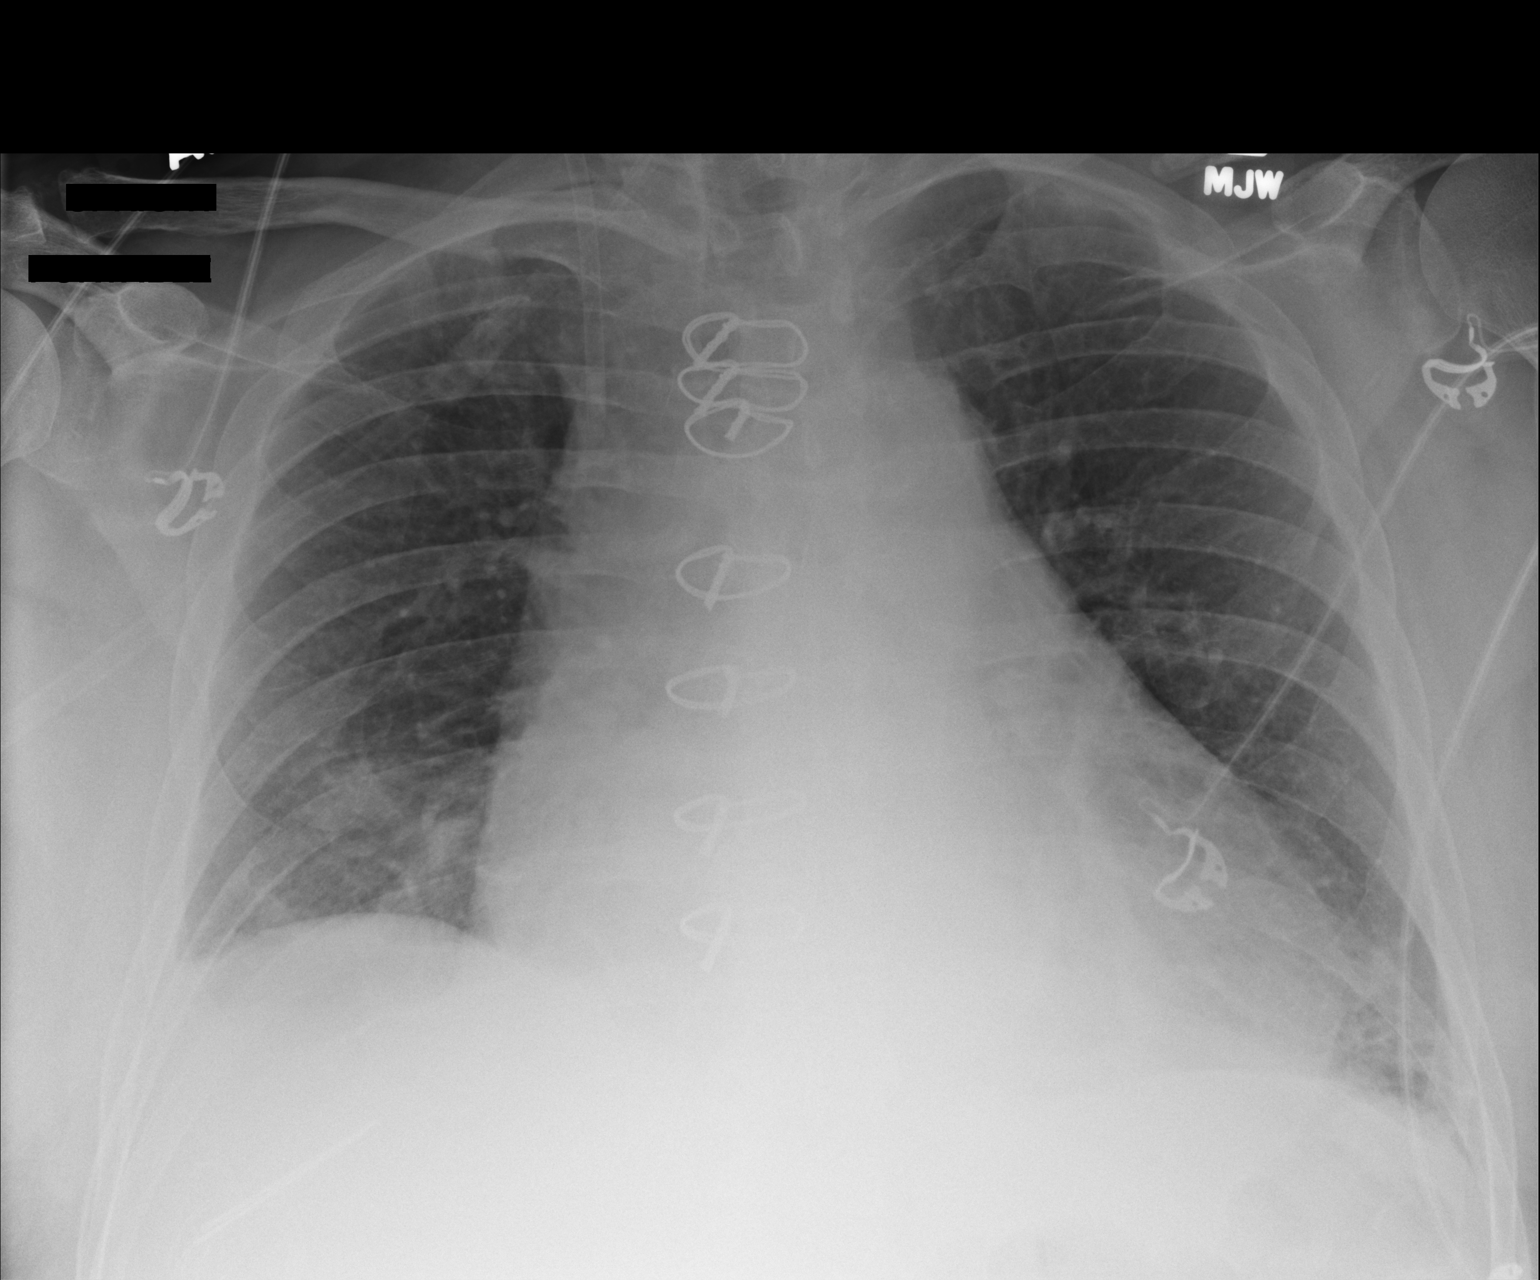

[1 of 1 positions shown; findings below may reference images not displayed]

FINDINGS: Interval removal of Swan-Ganz catheter and mediastinal drain.

Right jugular catheter with tip projecting over proximal SVC.

Enlargement of cardiac silhouette post median sternotomy.

Mediastinal contours and pulmonary vascularity normal.

Bibasilar atelectasis and questionable tiny right pleural effusion.

Remaining lungs clear.

No pneumothorax.

Nonunion of an old fracture middle third left clavicle.
IMPRESSION: Enlargement of cardiac silhouette post median sternotomy.

Bibasilar atelectasis and question tiny right pleural effusion.

## 2014-04-12 IMAGING — CR DG CHEST 2V
1 series · 3 of 3 positions shown · non-contrast
Comparison: 09/19/2013

CLINICAL DATA: Shortness of breath, hypertension

EXAM:
CHEST  2 VIEW

[Series 1: pa · 0.17mm/px · 3 of 3 slices shown]
[im 1/3]
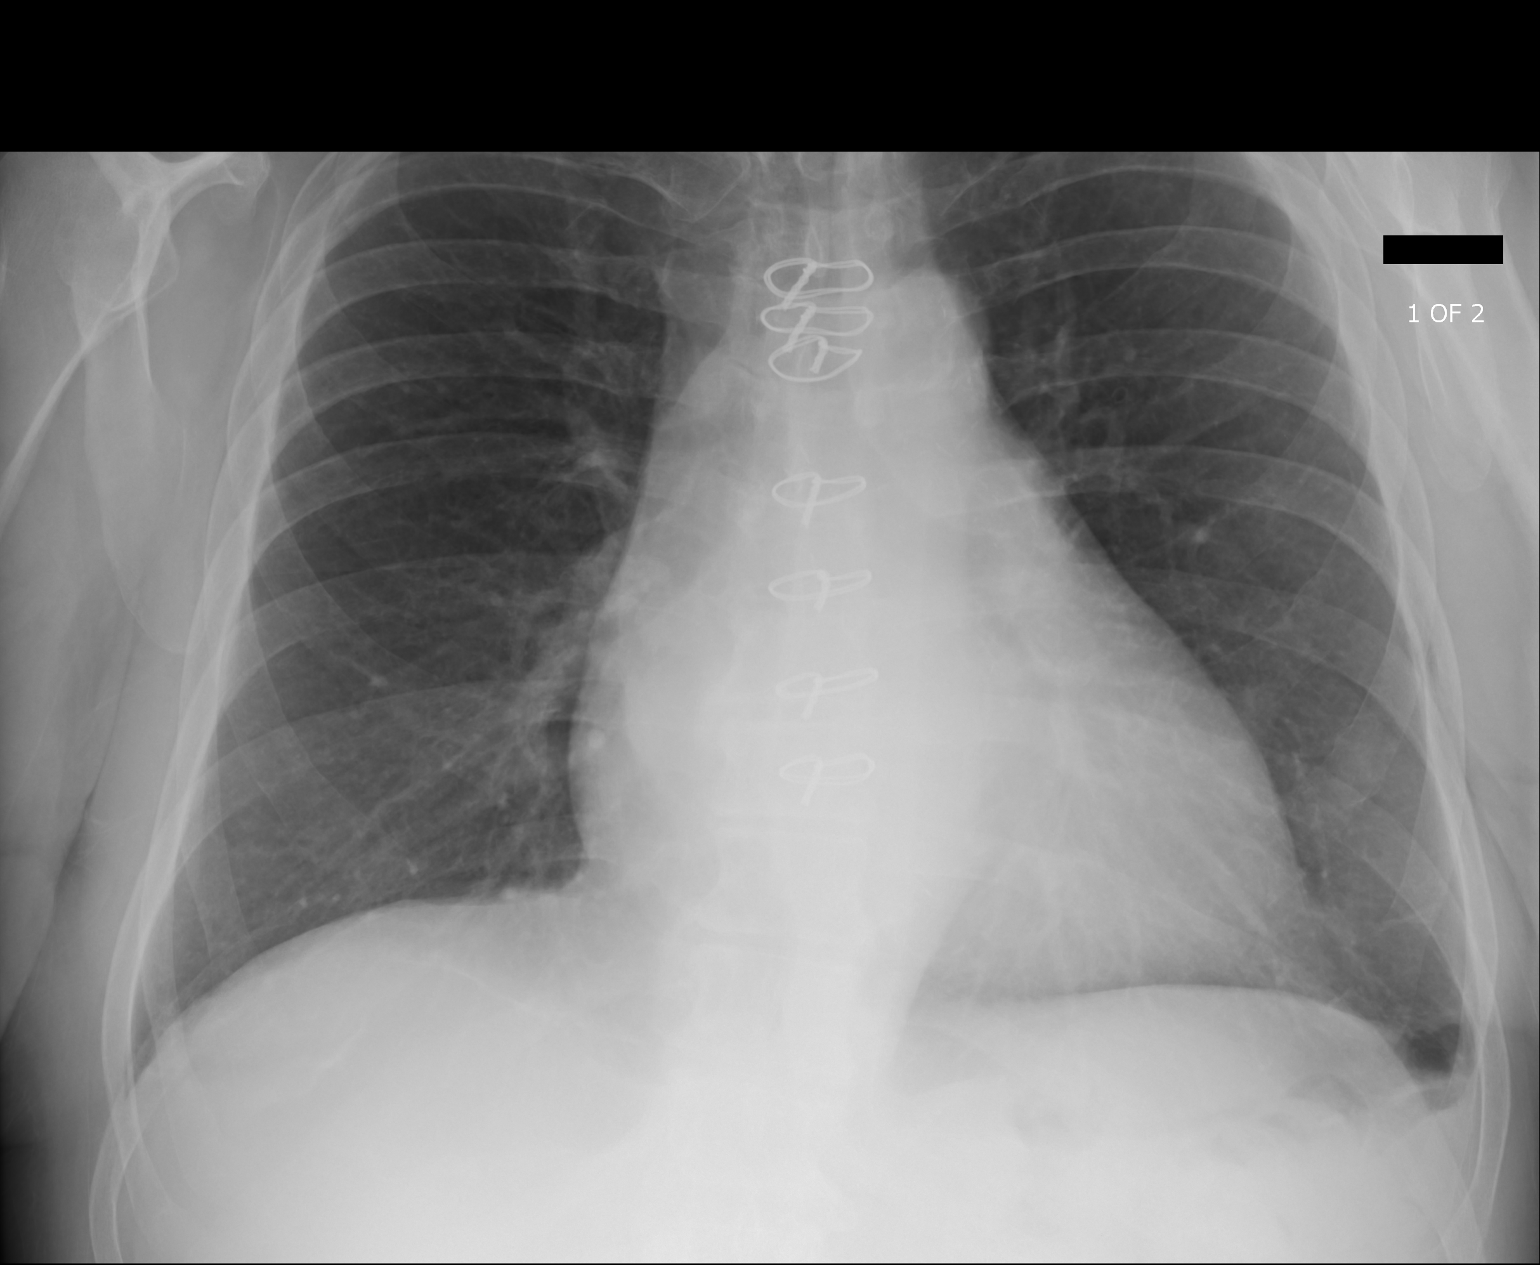
[im 2/3]
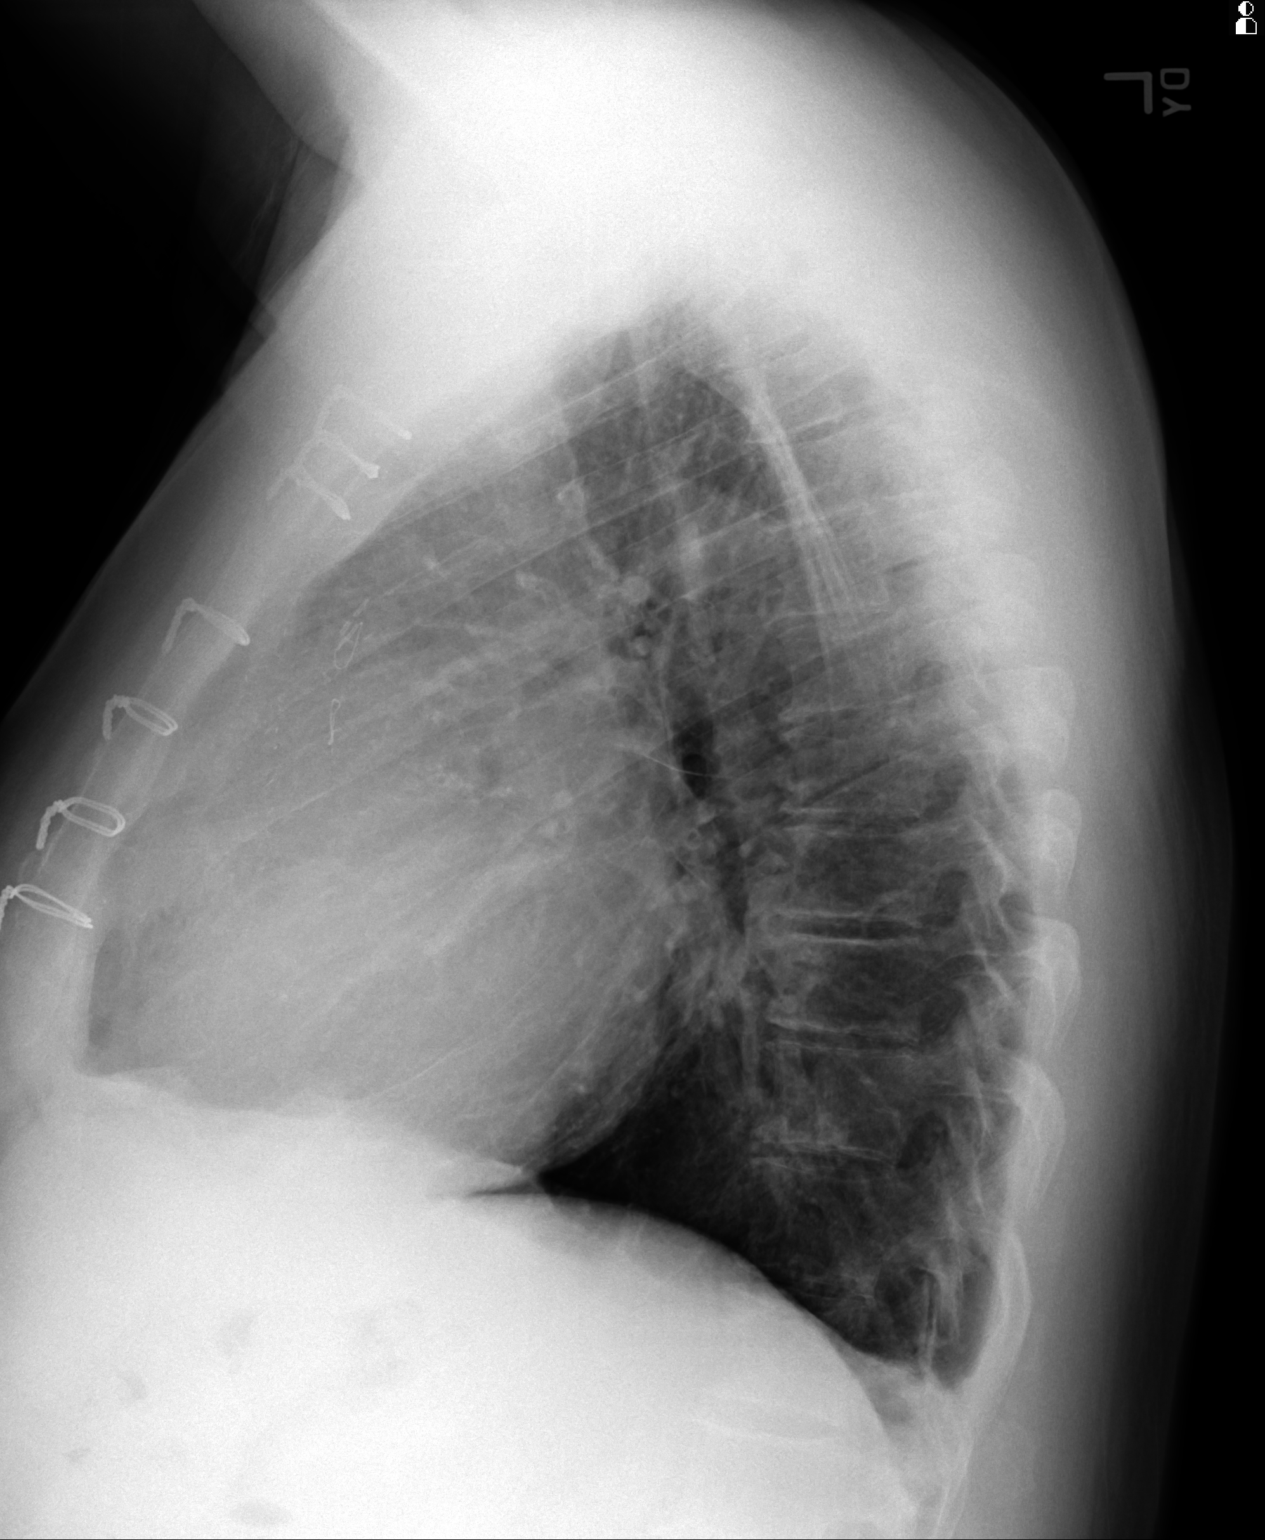
[im 3/3]
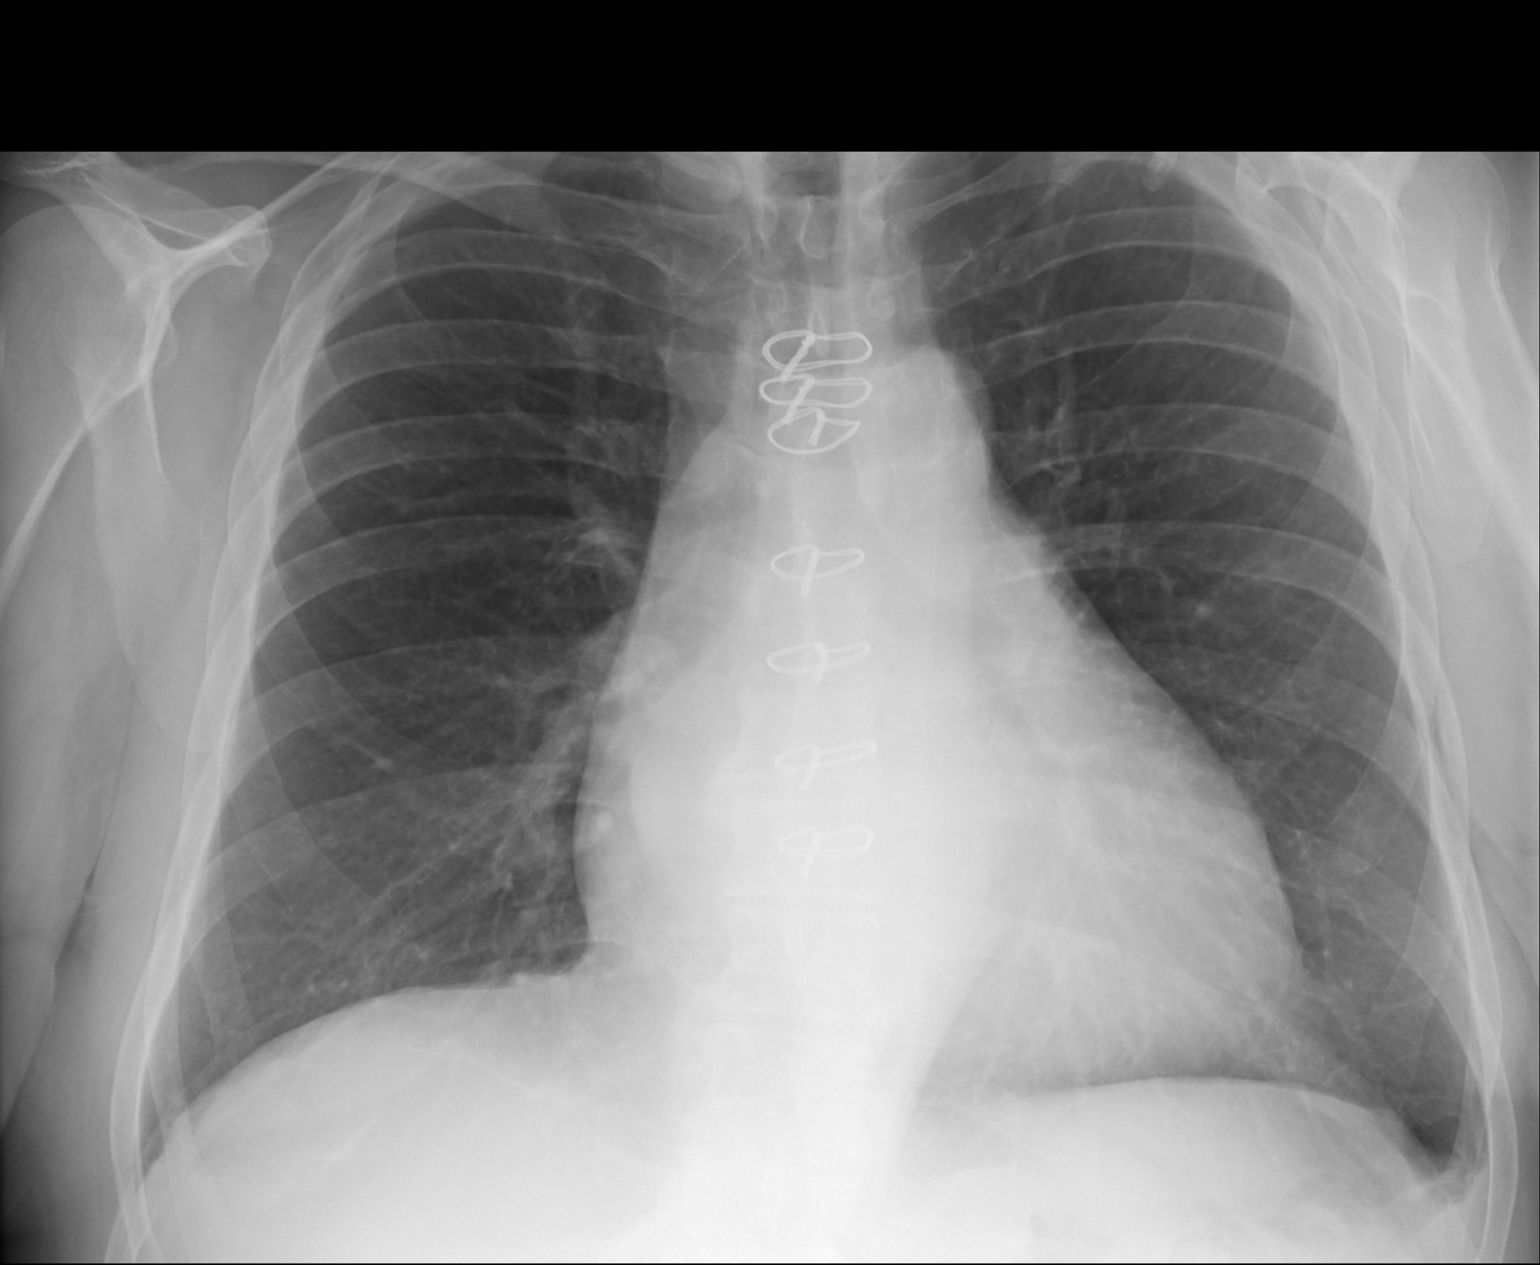

[3 of 3 positions shown; findings below may reference images not displayed]

FINDINGS: Evidence of median sternotomy noted. Moderate enlargement of the
cardiomediastinal silhouette is reidentified without evidence for
edema. Trace left pleural effusion is smaller than previously. No
new pulmonary opacity. Hyperaeration may suggest emphysema. Remote
left clavicular deformity reidentified.
IMPRESSION: Decreasing size of left pleural effusion.

Cardiomegaly without evidence for edema.

## 2014-05-05 ENCOUNTER — Other Ambulatory Visit: Payer: Self-pay | Admitting: *Deleted

## 2014-05-05 MED ORDER — FUROSEMIDE 40 MG PO TABS: 40.0000 mg | ORAL_TABLET | Freq: Every day | ORAL | Status: DC

## 2014-06-26 ENCOUNTER — Other Ambulatory Visit: Payer: Self-pay

## 2014-06-26 MED ORDER — CARVEDILOL 25 MG PO TABS: 25.0000 mg | ORAL_TABLET | Freq: Two times a day (BID) | ORAL | Status: DC

## 2014-06-26 NOTE — Telephone Encounter (Signed)
Refill sent for carvedilol.  

## 2014-09-18 ENCOUNTER — Emergency Department: Payer: Self-pay | Admitting: Emergency Medicine

## 2014-09-18 LAB — CBC WITH DIFFERENTIAL/PLATELET
Basophil #: 0.1 10*3/uL (ref 0.0–0.1)
Basophil %: 0.2 %
Eosinophil #: 0.1 10*3/uL (ref 0.0–0.7)
Eosinophil %: 0.4 %
HCT: 56.1 % — ABNORMAL HIGH (ref 40.0–52.0)
HGB: 18.7 g/dL — ABNORMAL HIGH (ref 13.0–18.0)
LYMPHS ABS: 1.7 10*3/uL (ref 1.0–3.6)
Lymphocyte %: 6 %
MCH: 32.3 pg (ref 26.0–34.0)
MCHC: 33.3 g/dL (ref 32.0–36.0)
MCV: 97 fL (ref 80–100)
MONO ABS: 1.4 x10 3/mm — AB (ref 0.2–1.0)
MONOS PCT: 4.7 %
Neutrophil #: 26.1 10*3/uL — ABNORMAL HIGH (ref 1.4–6.5)
Neutrophil %: 88.7 %
PLATELETS: 268 10*3/uL (ref 150–440)
RBC: 5.79 10*6/uL (ref 4.40–5.90)
RDW: 12.9 % (ref 11.5–14.5)
WBC: 29.4 10*3/uL — AB (ref 3.8–10.6)

## 2014-09-18 LAB — COMPREHENSIVE METABOLIC PANEL
ALK PHOS: 74 U/L
Albumin: 2.9 g/dL — ABNORMAL LOW (ref 3.4–5.0)
Anion Gap: 8 (ref 7–16)
BILIRUBIN TOTAL: 0.6 mg/dL (ref 0.2–1.0)
BUN: 18 mg/dL (ref 7–18)
CHLORIDE: 102 mmol/L (ref 98–107)
CREATININE: 1.23 mg/dL (ref 0.60–1.30)
Calcium, Total: 8.8 mg/dL (ref 8.5–10.1)
Co2: 24 mmol/L (ref 21–32)
EGFR (African American): >60
EGFR (Non-African Amer.): >60
Glucose: 352 mg/dL — ABNORMAL HIGH (ref 65–99)
Osmolality: 284 (ref 275–301)
POTASSIUM: 4 mmol/L (ref 3.5–5.1)
SGOT(AST): 15 U/L (ref 15–37)
SGPT (ALT): 24 U/L
SODIUM: 134 mmol/L — AB (ref 136–145)
Total Protein: 6 g/dL — ABNORMAL LOW (ref 6.4–8.2)

## 2014-09-18 LAB — LIPASE, BLOOD: LIPASE: 159 U/L (ref 73–393)

## 2014-09-18 LAB — URINALYSIS, COMPLETE
BACTERIA: NONE SEEN
Bilirubin,UR: NEGATIVE
Blood: NEGATIVE
KETONE: NEGATIVE
Leukocyte Esterase: NEGATIVE
Nitrite: NEGATIVE
Ph: 6 (ref 4.5–8.0)
RBC,UR: NONE SEEN /HPF (ref 0–5)
SPECIFIC GRAVITY: 1.005 (ref 1.003–1.030)
SQUAMOUS EPITHELIAL: NONE SEEN
WBC UR: NONE SEEN /HPF (ref 0–5)

## 2014-09-18 LAB — TROPONIN I: Troponin-I: 0.05 ng/mL

## 2014-09-18 LAB — APTT: Activated PTT: 30.1 secs (ref 23.6–35.9)

## 2014-09-18 LAB — PROTIME-INR
INR: 1
Prothrombin Time: 13.1 secs (ref 11.5–14.7)

## 2014-09-18 LAB — MAGNESIUM: MAGNESIUM: 1.8 mg/dL

## 2014-09-19 DIAGNOSIS — K55039 Acute (reversible) ischemia of large intestine, extent unspecified: Secondary | ICD-10-CM | POA: Insufficient documentation

## 2014-09-19 DIAGNOSIS — E119 Type 2 diabetes mellitus without complications: Secondary | ICD-10-CM | POA: Insufficient documentation

## 2014-09-19 DIAGNOSIS — I719 Aortic aneurysm of unspecified site, without rupture: Secondary | ICD-10-CM | POA: Insufficient documentation

## 2014-09-23 LAB — CULTURE, BLOOD (SINGLE)

## 2014-10-21 DIAGNOSIS — I7025 Atherosclerosis of native arteries of other extremities with ulceration: Secondary | ICD-10-CM | POA: Insufficient documentation

## 2014-12-25 NOTE — Consult Note (Signed)
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   He was seen in consultation last month. He follows up with the walk in clinic and receives outpatient antihypertensives through [facility], but had not filled these in 2 months. He was admitted with shortness of breath, and diagnosed with new onset acute combined CHF (EF 45%), mid inferolateral segment, apical lateral segment, apical inferior WMAs, moderate LVH, mild-mod biatrial enlargement. He did have mildly elevated cardiac biomarkers. He developed a-fib with RVR & atrial flutter shortly into the admission, which was rate-controlled on BB. He refused ischemic work-up and anticoagulation at that time. He followed up with Dr. Kirke Corin ~ 1 week after. He had not filled his medications. He was started on an lisinopril, carvedilol, amlodipine and Xarelto. He has obtained all but Xarelto which apparently takes 4-6 weeks to be filled on [facility]. He was advised to follow-up 1 week later and obtain labwork, but he did not present for this.   He reports experiencing DOE progressing to rest dyspnea, nonproductive cough, PND, orthopnea, LE edema and weight since the weekend. He denies chest pain. He has stopped smoking. Denies excessive salt, EtOH intake. He does drink plenty of soda. States BP has been better controlled since last month, but not "like before." He is unable to quantify. Denies palpitations, syncope, n/v/d, fevers, chills, abnormal bleeding. His symptoms worsened and he presented to [facility] ED.   Present Illness There, EKG revealed atrial fibrillation with improved inferolateral TWIs when compared to November tracings. Initial TnI was WNL. BNP was elevated at 2822. BMP- Na 134, otherwise WNL. BP 150-160/90s. CBC- WBC 11.4K. CXR- indicated  pulmonary edema. The patient was hypoxic at 87% requiring NRB. D-dimer did return mildly elevated at 0.95. CT-A revealed no PE, aortic dissection, but did confirm coronary atherosclerosis and pulmonary edema.   He was admitted by the medicine service and started on IV Lasix. Total I/O - 1400 mL so far. Fourteen lbs weight loss (? accurate). Atrial fibrillation has remained rate-controlled. Breathing improved this AM. Near baseline per patient. Two subsquent troponins returned WNL. SpO2 96% on RA. BNP 3618 this AM. TSH low at 0.196. Mg low at 1.7. Cardiology has been consulted for CHF and atrial fibrillation management.   PAST MEDICAL HISTORY:  1. Hypertension.  2. Type 2 diabetes mellitus.  3. Tobacco abuse.  4. Hyperlipidemia 5. Obesity 6. PAF 7. Chronic combined CHF 8. Medical noncompliance  PAST SURGICAL HISTORY:  None  FAMILY HISTORY: No premature coronary artery disease in the family.   SOCIAL HISTORY: The patient smoked a pack a day "my whole life" and recently quit. Occasional EtOH. No illicit drugs. Works as a Soil scientist. Divorced. Two healthy children.   ALLERGIES: No known drug allergies.   Physical Exam:  GEN no acute distress, obese   HEENT pink conjunctivae, PERRL, hearing intact to voice   NECK supple  No masses  trachea midline  JVP 8 cm   RESP normal resp effort  clear BS  no use of accessory muscles  no appreciable wheezing, rales or rhonchi   CARD Irregular rate and rhythm  Normal, S1, S2  No murmur   ABD denies tenderness  soft  normal BS   EXTR negative cyanosis/clubbing, negative edema   SKIN normal to palpation   NEURO follows commands, motor/sensory function  intact   PSYCH alert, A+O to time, place, person   Review of Systems:  Subjective/Chief Complaint shortness of breath   General: Fatigue   Respiratory: Frequent cough  Short of breath   Cardiovascular: Dyspnea  Orthopnea   Review of Systems: All other systems were reviewed and found to be  negative   Home Medications: Medication Instructions Status  amLODIPine 10 mg oral tablet 1 tab(s) orally once a day Active  metFORMIN 1000 mg oral tablet 1 tab(s) orally 2 times a day (with meals) Active  metoprolol tartrate 50 mg oral tablet 1 tab(s) orally 2 times a day Active  lisinopril 40 mg oral tablet 1 tab(s) orally once a day Active  carvedilol 12.5 mg oral tablet 1 tab(s) orally 2 times a day Active   Lab Results:  Thyroid:  09-Dec-14 02:02   Thyroid Stimulating Hormone  0.196 (0.45-4.50 (International Unit)  ----------------------- Pregnant patients have  different reference  ranges for TSH:  - - - - - - - - - -  Pregnant, first trimetser:  0.36 - 2.50 uIU/mL)  Routine Chem:  08-Dec-14 17:53   B-Type Natriuretic Peptide ([facility])  2822 (Result(s) reported on 11 Aug 2013 at 06:26PM.)  09-Dec-14 02:02   B-Type Natriuretic Peptide [facility])  3618 (Result(s) reported on 12 Aug 2013 at 02:37AM.)  Magnesium, Serum  1.7 (1.8-2.4 THERAPEUTIC RANGE: 4-7 mg/dL TOXIC: > 10 mg/dL  -----------------------)  Cardiac:  08-Dec-14 17:53   Troponin I 0.03 (0.00-0.05 0.05 ng/mL or less: NEGATIVE  Repeat testing in 3-6 hrs  if clinically indicated. >0.05 ng/mL: POTENTIAL  MYOCARDIAL INJURY. Repeat  testing in 3-6 hrs if  clinically indicated. NOTE: An increase or decrease  of 30% or more on serial  testing suggests a  clinically important change)  CK, Total 79  CPK-MB, Serum 2.0 (Result(s) reported on 11 Aug 2013 at 09:47PM.)    22:10   Troponin I 0.04 (0.00-0.05 0.05 ng/mL or less: NEGATIVE  Repeat testing in 3-6 hrs  if clinically indicated. >0.05 ng/mL: POTENTIAL  MYOCARDIAL INJURY. Repeat  testing in 3-6 hrs if  clinically indicated. NOTE: An increase or decrease  of 30% or more on serial  testing suggests a  clinically important change)  CK, Total 69  CPK-MB, Serum 1.5 (Result(s) reported on 11 Aug 2013 at 11:00PM.)  09-Dec-14 02:02   Troponin I 0.04  (0.00-0.05 0.05 ng/mL or less: NEGATIVE  Repeat testing in 3-6 hrs  if clinically indicated. >0.05 ng/mL: POTENTIAL  MYOCARDIAL INJURY. Repeat  testing in 3-6 hrs if  clinically indicated. NOTE: An increase or decrease  of 30% or more on serial  testing suggests a  clinically important change)  CK, Total 56  CPK-MB, Serum 1.2 (Result(s) reported on 12 Aug 2013 at 02:30AM.)  Routine Coag:  08-Dec-14 17:53   D-Dimer, Quantitative  0.95 ("If the D-dimer test is being used to assist in the exclusion of DVT and/or PE, note the following:  In various studies concerning the D-dimer methodology (STA Liatest) in use by this laboratory, it has been reported that with a cut-off value of 0.50 ug/mL FEU, the  negative predictive value regarding the exclusion of thrombosis is within the 95-100% range."  In patients with high pre-test probability of DVT/PE the results of the D-dimer test should be correlated with other diagnostic and clinical assessment modalities. Reference: [employer] 2005.)   EKG:  Interpretation atrial fibrillation, TWIs V5, V6, II, III, aVF, Q waves II, III   Rate 97  EKG Comparision TWIs improved from 07/2013 tracing   Radiology Results: XRay:    08-Dec-14 18:09, Chest Portable Single View  Chest Portable Single View   REASON FOR EXAM:    SHOB  COMMENTS:       PROCEDURE: DXR - DXR PORTABLE CHEST SINGLE VIEW  - Aug 11 2013  6:09PM     CLINICAL DATA:  Shortness of breath. History of diabetes and  hypertension.    EXAM:  PORTABLE CHEST - 1 VIEW    COMPARISON:  07/20/2013.    FINDINGS:  1800 hr. The heart size is at the upper limits of normal. There is  diffuse interstitial prominence which is similar to the prior  examination and suspicious for interstitial edema. No consolidation  or significant pleural effusion is identified. The osseous  structures appear intact.     IMPRESSION:  Generalized interstitial prominence suspicious for  interstitial  edema. No focal airspace disease.      Electronically Signed    By: Roxy Horseman M.D.    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   1. Acute on chronic combined CHF, EF 45% Known HF from prior admission. He has not been on a daily diuretic, and continues to drink plenty of fluids. No chest pain. EKG changes improved from last month. He has ruled out overnight. Will need outpatient diuretic and very close follow-up. Stress the importance of this. Still volume overloaded this AM. JVP 8cm. BNP up.  -- Would give Lasix  IV x 1 + KCl this AM, then switch to Lasix  PO daily + KDur 20 mEq daily  -- Continue lisinopril, switch back to carvedilol 12.5mg  BID (alpha activity, reduced EF) -- Will need labwork in 1 week to monitor K, renal function. Lisinopril may mitigate potassium wasting effects.  -- Stress nonpharmacologic CHF management- low Na/fluid, control BP -- Monitor daily weights, I/Os, serial BMETs, correct electrolyte abnormalities -- Case management consult to assist with medications and [facility] -- Will need cardiac cath in the near future (see below)  2. Paroxysmal atrial fibrillation/flutter The patient remains in rate-controlled atrial fibrillation. Structural cardiac changes evident on echo from interstitial lung disease, uncontrolled HTN, DM2, possible OSA, possible ischemia predisposing. Exacerbating factors include CHF, hypomagnesemia.  Rate-controlled on carvedilol. CHADSVASc = at least 4 (CHF, HTN, CAD, DM2), thus warranting anticoagulation. Stressed the importance of this and the risk of CVA. He agrees to start Xarelto -- Start Xarelto  while inpatient. Case management consult for medication assistance. Will need to be covered until Xarelto filled by [facility]. Reduce ASA to .  -- Switch to carvedilol 12.5mg  BID -- Replete Mg   Plan 3. Coronary atherosclerosis Mild troponin elevation last admission. No chest pain at that time. There were inferolateral TWIs which appear more symmetric from an EKG last month. Now improved this admission. CT-A on arrival indicates CAD. Ischemia of concern given reduced EF, multiple WMAs on echo and decompensated HF.  Ruled out overnight. No chest pain or worsening EKG changes. -- Discussed need for cardiac cath in the near future. The patient has now agreed to pursue this. Stable at present. -- Favor controlling blood pressure and volume status first. He needs to follow-up. Compliance still in question. Should PCI be required, consider BMS given need for anticoagulation and issue of compliance. -- Low-dose ASA, ACEi, BB, Crestor 20, NTG SL PRN  -- Case management for medication assistance  4. Uncontrolled DM2 with diabetic neuropathy Hgb A1C 9.0% on metformin. Bilateral foot numbness/tingling represents diabetic foot neuropathy. Renal function preserved.  -- Management per primary team. Unsure that he would be compliant with insulin.  -- ACEi as above -- Reasess Hgb A1C in 2 months. Needs education about diet (sodas)  5. Uncontrolled HTN w/ hypertensive cardiomyopathy Moderate LVH with biatrial enlargement on echo. Question of maybe LV strain on EKG last month. Favor ischemic TWIs, however, as they were symmetric appearing. Home BP 150-160/90s. Improved with current anthypertensives.  -- Continue lisinopril, carvedilol (alpha activity) and amlodipine, titrate as needed  -- Monitor BP as  OP  5. Ongoing tobacco abuseWilling to quit. -- Stress smoking cessation.-- Nicotine replacement therapy- nicotine patch taper  6. Hypomagnesemia 1.7 this AM -- Replete Attending Note:   Pt seen and examined.  Agree with the above note. He has diuresed quite a bit overnight.  He feels back to normal and would like to go home.  We can see him in the office soon and he should also follow up with his medical doctor.  Marland Kitchen  He will need a cardiac cath at some point   Electronic Signatures: Gregory Dowe, Pincus Sanes (PA-C)  (Signed 09-Dec-14 10:22)  Authored: General Aspect/Present Illness, History and Physical Exam, Review of System, Home Medications, Labs, EKG , Radiology, Allergies, Vital Signs/Nurse's Notes, Impression/Plan Nahser, Antony Blackbird (MD)  (Signed 09-Dec-14 17:20)  Authored: Impression/Plan  Co-Signer: General Aspect/Present Illness, History and Physical Exam, Review of System, Home Medications, Labs, EKG , Radiology, Allergies, Vital Signs/Nurse's Notes, Impression/Plan   Last Updated: 09-Dec-14 17:20 by Nahser, Antony Blackbird (MD)

## 2014-12-25 NOTE — H&P (Signed)
PATIENT NAME:  Marc [name], SCHNELLE MR#:  [id number] DATE OF BIRTH:  06-13-1961  DATE OF ADMISSION:  07/21/2013  PRIMARY CARE PHYSICIAN: At [facility].   CHIEF COMPLAINT: Shortness of breath.   HISTORY OF PRESENT ILLNESS: A 54 year old Caucasian male patient with history of hypertension, diabetes mellitus, tobacco abuse, presents to the hospital with acute onset of shortness of breath earlier today. The patient did not have any orthopnea, edema or chest pain. He did have some on and off palpitations. The patient mentions that he ran out of his medications 2 months back. He was on 5 different blood pressure medications which included Toprol, clonidine, Aldactone, lisinopril and amlodipine. In the Emergency Room, the patient has had shortness of breath with elevated troponin of 0.09 and admitted to the hospitalist service for further workup and treatment.   The patient had a stress test about 15 years back at [facility] which was normal. The patient works as a Soil scientist.   PAST MEDICAL HISTORY:  1. Hypertension.  2. Type 2 diabetes mellitus.  3. Tobacco abuse.   FAMILY HISTORY: No premature coronary artery disease in the family.   SOCIAL HISTORY: The patient smokes a pack a day. No alcohol. No illicit drugs. Works as a Soil scientist.   ALLERGIES: No known drug allergies.   REVIEW OF SYSTEMS: Please see history of present illness.  CONSTITUTIONAL: Complains of some fatigue, weakness.  EYES: No blurred vision, pain, redness.  ENT: No tinnitus, ear pain, hearing loss.   RESPIRATORY: Has shortness of breath. No cough or wheezing.  CARDIOVASCULAR: No chest pain, orthopnea, edema.  GASTROINTESTINAL: No nausea, vomiting, diarrhea, abdominal pain.  GENITOURINARY: No dysuria, hematuria, frequency.  ENDOCRINE: No polyuria, nocturia, thyroid problems.  HEMATOLOGIC AND LYMPHATIC: No anemia, easy bruising, bleeding.   INTEGUMENTARY: No acne, rash, lesions.  MUSCULOSKELETAL: No back pain, arthritis.  NEUROLOGIC:  No focal numbness, weakness, seizures.  PSYCHIATRIC: No anxiety or depression.   HOME MEDICATIONS: The patient does not remember the doses, but he was on clonidine, Toprol, Aldactone, Norvasc, lisinopril and metformin.   PHYSICAL EXAMINATION:  VITAL SIGNS: Show temperature of 98, pulse of 85, blood pressure 225/115, saturating 95% on room air. After I arrived to the floor, the patient had episodes of A-flutter on the telemetry.  GENERAL: Obese Caucasian male patient lying in bed, comfortable, conversational, cooperative with exam.  PSYCHIATRIC: Alert, oriented x 3. Mood and affect appropriate. Judgment intact.  HEENT: Atraumatic, normocephalic. Oral mucosa moist and pink. External ears and nose normal. No pallor. No icterus. Pupils bilaterally equal and reactive to light.  NECK: Supple. No thyromegaly, palpable lymph nodes. Trachea midline. No carotid bruit, JVD.  CARDIOVASCULAR: S1, S2 without any murmurs. Peripheral pulses 2+.  RESPIRATORY: Normal work of breathing. Clear to auscultation on both sides.  GASTROINTESTINAL: Soft abdomen, nontender. Bowel sounds present. No hepatosplenomegaly palpable.  SKIN: Warm and dry. No petechiae, rash, ulcers.  MUSCULOSKELETAL: No joint swelling, redness, effusion of the large joints. Normal muscle tone.  NEUROLOGICAL: Motor strength 5/5 in upper extremities. Sensation is intact all over.  LYMPHATICS: No cervical lymphadenopathy.   LABORATORY STUDIES: Show BNP of 1957. Glucose 192, BUN 13, creatinine 1.17, sodium 138, potassium 3.4. Magnesium 1.6. AST, ALT, alkaline phosphatase, bilirubin normal. CK of 221. Troponin 0.09. WBC 10.2, hemoglobin 16.9.   EKG shows normal sinus rhythm with PVCs, PAC. No acute changes.   Chest x-ray shows chronic changes. No acute abnormalities. No pulmonary edema or infiltrate.   ASSESSMENT AND PLAN:  1. Atrial flutter  is the likely cause of the patient's shortness of breath and palpitations. Does have a mildly elevated  troponin at 0.09. Will have to rule out acute coronary syndrome in this patient. Could be anginal equivalent for the shortness of breath. Will treat as non-ST elevation myocardial infarction at this time. Start on aspirin, statin and beta blocker. Consult cardiology. Symptoms are less than 24 hours. Check echocardiogram.  2. Accelerated hypertension: Will start on Toprol and clonidine. The patient was on 5 different blood pressure medications which need to be added gradually. Will use hydralazine intravenous p.r.n.   3. Diabetes mellitus: Was on metformin. Will check an HbA1c. Hold the metformin in case the patient needs a cardiac catheterization. Put him on sliding scale insulin.  4. Tobacco abuse: Counseled the patient to quit for greater than 3 minutes. The patient understands he needs to quit. No nicotine patch requested.   CODE STATUS: FULL CODE.   TIME SPENT ON THIS CASE: 60 minutes.   ____________________________ Molinda Bailiff Roshawn Ayala, MD srs:gb D: 07/21/2013 04:11:54 ET T: 07/21/2013 04:45:29 ET JOB#: [id number]  cc: Wardell Heath R. Estrella Alcaraz, MD, <Dictator> [facility] Orie Fisherman MD ELECTRONICALLY SIGNED 07/21/2013 20:48

## 2014-12-25 NOTE — H&P (Signed)
PATIENT NAME:  Marc [name], Marc [name] MR#:  [id number] DATE OF BIRTH:  03-18-1961  DATE OF ADMISSION:  08/11/2013  REFERRING PHYSICIAN: Dr. Glenetta Hew.   PRIMARY CARE PHYSICIAN:  [facility].   PRIMARY CARDIOLOGIST: [facility].   CHIEF COMPLAINT: Shortness of breath.   HISTORY OF PRESENT ILLNESS: The patient is a 54 year old male with history of diastolic and systolic CHF, hospitalization here in the middle of last month for non-ST elevation MI, during which he refused a stress test. He states that he did follow up with a cardiologist, was given some prescriptions and did go to [facility] and filled them up. He has been getting progressive shortness of breath for the last 3 days, has dyspnea on exertion, paroxysmal nocturnal dyspnea and swelling in the legs. He has a dry cough. As symptoms progressed last night, he felt like he could not sleep secondary to the dyspnea. He came into the hospital today and was noted to be in CHF. He also was noted to be hypoxic with O2 sat of 87% requiring increased concentration of oxygen. Currently, he required up to a nonrebreather for his hypoxemia, as he was so hypoxic on nasal cannula. He was given some Lasix, and he has peed about 2 liters. He feels somewhat better, but not much. Hospitalist services were contacted for further evaluation and management.   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   FAMILY HISTORY: Premature CAD is not in the family.   SOCIAL HISTORY: Still smokes a pack a day. No alcohol on a regular basis, but he does drink occasionally. No illicit drug use. Works as a Soil scientist still.  ALLERGIES: None.   OUTPATIENT MEDICATIONS: Amlodipine 10 mg daily, carvedilol 12.5 mg daily. He appears also to be on metoprolol 50 mg 2 times a day, metformin 1000 mg 2  times a day, lisinopril 40 mg daily. He does not appear to be on a diuretic.   REVIEW OF SYSTEMS:  CONSTITUTIONAL: No weight changes.  EYES: Has chronic blurry vision. No new blurry vision.  EARS, NOSE, THROAT: No tinnitus or hearing loss. No upper respiratory symptoms. No nasal congestion.  RESPIRATORY: Positive for dry cough, shortness of breath, dyspnea on exertion. No painful respirations.  CARDIOVASCULAR: No chest pain. Has swelling in the legs, orthopnea, paroxysmal nocturnal  dyspnea.  GASTROINTESTINAL: Does feel upset stomach when he does not eat with his foods, but otherwise no bloody stools, dark stools.  GENITOURINARY: Denies dysuria, hematuria.  HEMATOLOGIC AND LYMPHATICS:  Denies anemia or easy bruising.  SKIN: No rashes.  MUSCULOSKELETAL: Denies arthritis or gout.  NEUROLOGIC: Denies focal weakness or numbness.  PSYCHIATRIC: Denies anxiety or insomnia.   PHYSICAL EXAMINATION: VITAL SIGNS: Temperature on arrival is 98. Pulse rate 92, respiratory rate 24, blood pressure 150/86 initially, last one 145/82. Initial O2 sat was 87% on oxygen. Currently, he is 96% on a nonrebreather. Appears to be a little bit more comfortable.  GENERAL: The patient is an unkempt-appearing, obese male sitting on bed.  HEENT: Normocephalic, atraumatic. Pupils are equal and reactive. Anicteric sclerae. Extraocular muscles intact. Moist mucous membranes.  NECK: Supple. No thyroid tenderness. No cervical lymphadenopathy. Positive for JVD and hepatojugular reflex.  CARDIOVASCULAR: S1, S2, irregularly irregular. No significant murmurs appreciated.  LUNGS: Bilateral crackles at the mid bases.  ABDOMEN: Soft, nontender, nondistended. Positive bowel sounds in all quadrants.  EXTREMITIES:  1+ pitting edema.  NEUROLOGIC: Cranial nerves II through XII grossly intact. Strength is 5/5 in all extremities.  PSYCHIATRIC: Awake, alert, oriented x 3.  SKIN: No obvious rashes.   LABS AND IMAGING: Glucose 281. BNP  2822. BUN 18, creatinine 0.87, sodium 134, potassium 3.9. Troponin negative. White count 11.4, hemoglobin 14.9, platelets 225. D-dimer was 0.95.  CT angio of the chest for PE shows no PE. There is cardiomegaly with interstitial and alveolar pulmonary edema compatible with moderate CHF, and also pulmonary edema. Chest x-ray, 1 view, shows generalized interstitial prominence suspicious for interstitial edema. No focal airspace disease.   EKG: Afib, rate is 97. No acute ST elevations or depressions.   ASSESSMENT AND PLAN: We have a 54 year old with recent admission for non-ST elevation myocardial infarction, whom refused a stress test; history of noncompliance, history of diabetes, hypertension and underlying suspected coronary artery disease, who presents with acute hypoxic respiratory failure with significant hypoxemia requiring a nonrebreather, likely in the setting of acute on chronic diastolic and systolic combined congestive heart failure. The patient appears to be noncompliant again, as he does not appear to be on Lasix at this point. I do not know if [facility] refilled the Lasix for him or not, but I have gone over his medications myself, and he states that he is on all of these and no more. He states that he is only on aspirin full dose for his Afib, but he does not want to be on anticoagulation, but that is the only medication that is not currently here. He does not appear to be on Lasix, and I believe that is the etiology of his CHF. At this point, we will admit him to the hospital. He has had good urinary output with Lasix thus far. He has received 40 mg IV. We would admit him to telemetry and start Lasix IV b.i.d. 20 mg for now, obtain cardiology consult, start nitro patch and morphine p.r.n. Continue his beta blocker and an ACE inhibitor. I would monitor ins and outs. I would defer on another echocardiogram as it was recently done. He does not have any acute chest pains.  Compliance with medications  was  again discussed with him. He does also still smoke, and he was counseled against smoking for greater than 3 minutes. In regards to his Afib, it appears to be rate controlled with a beta blocker. I did discuss with him, again, the risks and benefits of anticoagulation, as he does have a CHAD2S score of greater than 2, but at this point, he states that he is going to stick to his full dose aspirin. He is aware of the risks of this including a stroke. I would continue his blood pressure medications. I would start him on DVT prophylaxis with heparin.   Total critical care time spent is 50 minutes.   The patient is FULL CODE.   ____________________________ Krystal Eaton, MD sa:dmm D: 08/11/2013 20:40:11 ET T: 08/11/2013 21:11:21 ET JOB#: [id number]  cc: Krystal Eaton, MD, <Dictator> Krystal Eaton MD ELECTRONICALLY SIGNED 08/19/2013 11:03

## 2014-12-25 NOTE — Discharge Summary (Signed)
PATIENT NAME:  Marc [name], Marc [name] MR#:  [id number] DATE OF BIRTH:  21-Mar-1961  DATE OF ADMISSION:  08/11/2013 DATE OF DISCHARGE:  08/12/2013  ADMITTING DIAGNOSIS: Shortness of breath.   DISCHARGE DIAGNOSES: 1.  Shortness of breath, acute on chronic systolic congestive heart failure.  2.  Accelerated hypertension.  3.  Atrial fibrillation.  4.  Recent non-Q myocardial infarction.  The patient refuse catheterization during his previous hospitalization. He will follow-up with Cardiology as outpatient for cardiac catheterization.  5.  Diabetes with poor control.  6.  Tobacco abuse.  7.  Medical noncompliance.   PERTINENT LABS AND EVALUATIONS: Admitting BNP 2822, glucose 281, BUN 18, creatinine 0.87, sodium 134, potassium 3.9, chloride 100, CO2 is 26, calcium was 9.5. Troponin was 0.03, 0.04, 0.04. TSH 0.196. WBC 11.4, hemoglobin 14.9, platelet count 225. D-dimer 0.95, Free T4 is 7.2. EKG shows atrial fibrillation, left lateral infarct, inferior infarct, age  indeterminate. Chest x-ray shows generalized interstitial prominence suspicious for an interstitial edema.   HOSPITAL COURSE: Please refer to H and P done by the admitting physician. The patient is a 54 year old white male with history of diastolic and systolic CHF who was hospitalized last month for non-ST MI during which he refused a stress test. The patient was followed up with Cardiology and was given some prescriptions and did not get his Lasix filled according to him. The patient presented with shortness of breath and was hypoxic. Initially had to be briefly placed on a nonrebreather. He was given IV Lasix with good results. By the next morning, he was on room air. The patient did put out a good amount of urine. He was seen by Cardiology. They recommended continuing medical management and medical compliance. The patient strongly told that he needs to be compliant with his medications.  He stated that he was ready to go home. His breathing is  much improved. In terms of his previous history of non-Q-wave MI, he needs a follow-up with Cardiology and a possible cardiac cath as an outpatient. The patient also has diabetes, which is under very poor control.  He was counseled regarding appropriate diet and medication compliance. At this time, he is stable for discharge.   DISCHARGE MEDICATIONS: Amlodipine 10 mg daily, metformin 1000 mg 1 tab p.o. b.i.d., lisinopril 40 daily, aspirin 325 daily, carvedilol 12.5 mg 1 tab p.o. b.i.d., Lasix 40 daily, Rivaroxaban 20 mg daily, K-Tab 20 mEq 1 tab p.o. daily.   DIET: Low sodium.   ACTIVITY: As tolerated.   FOLLOWUP: Cardiology, Dr. Kirke Corin in 1 to 2 weeks. Follow-up with [facility]. He has an appointment today.   TIME SPENT:  32 minutes spent on this discharge.     ____________________________ Lacie Scotts. Allena Katz, MD shp:dp D: 08/13/2013 08:11:35 ET T: 08/13/2013 08:27:59 ET JOB#: [id number]  cc: Tilman Mcclaren H. Allena Katz, MD, <Dictator> Charise Carwin MD ELECTRONICALLY SIGNED 08/15/2013 12:49

## 2014-12-25 NOTE — Consult Note (Signed)
General Aspect Marc [name] is a 54yo male w/ no prior cardiac history, PMHx s/f DM2, HLD, HTN, ongoing tobacoo abuse and obesity who was admitted to [facility] this AM with shortness of breath.   He reports undergoing a stress test at [facility] ~15 years ago which was "normal."  He had run out of many of his medications two months ago. He has no insurance. He follows up with the walk-in clinic in town. He is established through the [facility]. Since running out of his meds, he has noticed progressively worsening DOE and exertional chest pain.   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 This has worsened more recently in the past 2-3 weeks to the point where he cannot climb 1 flight of stairs w/o SOB. He does experience PND. No weight increase, LE edema, orthopnea or new cough. He continues to smoke 1 PPD.   He describes his chest discomfort as left-sided, sharp and aggravated by strenuous activity. Each episode abates after 2-3 minutes of rest. No radiation. He does not have NTG.   He does note occasional associated palpitations. No lightheadedness or syncope. He does snore and wake up gasping for breath at night.   SBP runs in the 180s off antihypertensives. He denies abnormal bleeding, n/v/d. He endorses bilateral foot numbness/tingling.   His dyspnea continued to worsen, and he presented the [facility] ED.   Present Illness In the ED, EKG revealed NSR, frequent PVCs, IVCD and LAE. Initial TnI was mildly elevated at 0.09. BNP was elevated at 1957. CMP- low K (3.4), low Mg (1.6). Hgb A1C 9.2%. BP 225/115. BUN 13/Cr 1.17. CBC WNL. CXR- mild diffuse interstitial prominence, age indeterminate though not described on prior chest report 1999, this could reflect bronchiolitis, if more chronic appearing could be related to interstitial lung disease. Recommend followup chest radiograph to verify findings. He was started on LMWH. Two subsequent troponins returned at 0.10 and 0.13.  Echo pending.   Shortly after admission, he developed a-fib with RVR, HR 150s. This was controlled on oral metoprolol. HR 70s currently. He received a dose of Lasix IV this AM. I/O -1410 mL.   PAST MEDICAL HISTORY:  1. Hypertension.  2. Type 2 diabetes mellitus.  3. Tobacco abuse.  4. Hyperlipidemia 5. Obesity  PAST SURGICAL HISTORY:  None  FAMILY HISTORY: No premature coronary artery disease in the family.   SOCIAL HISTORY: The patient smokes a pack a day "my whole life." Occasional EtOH. No illicit drugs. Works as a Soil scientist. Divorced. Two healthy children.   ALLERGIES: No known drug allergies.   Physical Exam:  GEN no acute distress, obese   HEENT pink conjunctivae, PERRL, hearing intact to voice   NECK supple  No masses  trachea midline  JVP 7 cm, no bruits   RESP normal resp effort  no use of accessory muscles  distant breath sounds, no wheezing, rhonchi or rales   CARD Regular rate and rhythm  Normal, S1, S2  No murmur   ABD denies tenderness  soft  normal BS  +HJR   EXTR negative cyanosis/clubbing, negative edema   SKIN normal to palpation   NEURO follows commands, motor/sensory function intact   PSYCH alert, A+O to time, place, person   Review of Systems:  Subjective/Chief Complaint SOB   Respiratory: Short of breath   Cardiovascular: Chest pain or discomfort  Palpitations  Dyspnea   Neurologic: bilateral foot numbness/tingling   Review of Systems: All other systems were  reviewed and found to be negative   Home Medications: Medication Instructions Status  clonidine 0.3 mg twice a day  Active  toprol xl 100 mg twice a day  Active  METFORMIN 500 MG BID    x 1 days  Active  LIPITOR 20 MG DAILY  Active   Lab Results:  Routine Chem:  16-Nov-14 11:43   B-Type Natriuretic Peptide [facility])  1957 (Result(s) reported on 21 Jul 2013 at 01:03AM.)    23:43   Hemoglobin A1c ([facility])  9.2 (The American Diabetes Association recommends that a primary goal  of therapy should be <7% and that physicians should reevaluate the treatment regimen in patients with HbA1c values consistently >8%.)  Magnesium, Serum  1.6 (1.8-2.4 THERAPEUTIC RANGE: 4-7 mg/dL TOXIC: > 10 mg/dL  -----------------------)  BUN 13  Creatinine (comp) 1.17  Potassium, Serum  3.4  Cardiac:  16-Nov-14 23:43   CK, Total 221  CPK-MB, Serum  4.6 (Result(s) reported on 21 Jul 2013 at 12:25AM.)  Troponin I  0.09 (0.00-0.05 0.05 ng/mL or less: NEGATIVE  Repeat testing in 3-6 hrs  if clinically indicated. >0.05 ng/mL: POTENTIAL  MYOCARDIAL INJURY. Repeat  testing in 3-6 hrs if  clinically indicated. NOTE: An increase or decrease  of 30% or more on serial  testing suggests a  clinically important change)  17-Nov-14 03:37   CK, Total 184  CPK-MB, Serum  3.8 (Result(s) reported on 21 Jul 2013 at 04:21AM.)  Troponin I  0.10 (0.00-0.05 0.05 ng/mL or less: NEGATIVE  Repeat testing in 3-6 hrs  if clinically indicated. >0.05 ng/mL: POTENTIAL  MYOCARDIAL INJURY. Repeat  testing in 3-6 hrs if  clinically indicated. NOTE: An increase or decrease  of 30% or more on serial  testing suggests a  clinically important change)    07:29   CK, Total 149  CPK-MB, Serum 3.5 (Result(s) reported on 21 Jul 2013 at [facility].)  Troponin I  0.12 (0.00-0.05 0.05 ng/mL or less: NEGATIVE  Repeat testing in 3-6 hrs  if clinically indicated. >0.05 ng/mL: POTENTIAL  MYOCARDIAL INJURY. Repeat  testing in 3-6 hrs if  clinically indicated. NOTE: An increase or decrease  of 30% or more on serial  testing suggests a  clinically important change)   EKG:  Interpretation NSR, frequent PVCs, IVCD, LAE, Q waves V5, V6, II, III, aVF   Rate 89   Radiology Results: XRay:    17-Nov-14 00:03, Chest PA and Lateral  Chest PA and Lateral   REASON FOR EXAM:    Chest Pain  COMMENTS:       PROCEDURE: DXR - DXR CHEST PA (OR AP) AND LATERAL  - Jul 21 2013 12:03AM     CLINICAL DATA:  Difficulty  breathing with dizziness, hypertension.    EXAM:  CHEST  2 VIEW    COMPARISON:  Chest radiograph report February 18, 1998, images not  available for direct comparison.    FINDINGS:  The cardiac silhouette is upper limits of normal, mediastinal  silhouette is unremarkable. Mild diffuse interstitial prominence  without superimposed pleural effusions or focal consolidations. No  pneumothorax. Soft tissue planes and included osseous structures are  nonsuspicious.     IMPRESSION:  Mild diffuse interstitial prominence, age indeterminate though not  described on prior chest report 1999, this could reflect  bronchiolitis, if more chronic appearing could be related to  interstitial lung disease. Recommend followup chest radiograph to  verify findings.      Electronically Signed    By: Awilda Metro  On: 07/21/2013 00:29         Verified By: Dorris Carnes, M.D.,    No Known Allergies:   Vital Signs/Nurse's Notes: **Vital Signs.:   17-Nov-14 07:31  Vital Signs Type Q 4hr  Temperature Temperature (F) 98.4  Celsius 36.8  Temperature Source oral  Pulse Pulse 74  Respirations Respirations 20  Systolic BP Systolic BP 147  Diastolic BP (mmHg) Diastolic BP (mmHg) 94  Mean BP 111  Pulse Ox % Pulse Ox % 94  Pulse Ox Activity Level  At rest  Oxygen Delivery Room Air/ 21 %    Impression 54yo male w/ no prior cardiac history, PMHx s/f DM2, HLD, HTN, ongoing tobacoo abuse and obesity who was admitted to [facility] this AM with shortness of breath.   1. NSTEMI The patient indicates experiencing progressively worsening DOE and exertional chest discomfort. NYHA class III. He has had a prior stress test ~ 15 years ago at [facility] which was normal. He has been off antihypertensives for the past 2 months. Cardiac RFs include DM2, HTN, HLD, tobacco abuse and obesity. Objectively, serial troponins have returned mildly elevated. BNP elevated. Mildly fluid overloaded on exam. Good UOP with Lasix.  Atrial fib/flutter, marked HTN-225/115 -certainly could have contributing to this; however given high pretest probability, suspect underlying CAD as well w/ hypertensive, possibly ischemic, cardiomyopathy. Will need further ischemic work-up.  -- Plan Lexiscan Myoview in the AM. Reserve cardiac cath for high risk stress test. Main issue and limitations revolve around compliance. Will need case management consult for medication affordability.  -- Continue ASA, BB -- Add ACEi/ARB with DM2 -- Upgrade to atorvastatin 80 -- Will need NTG SL on d/c   2. Paroxysmal atrial fibrillation/flutter Unclear chronicity, but likely > 48 hours. Suspect underlying structural cardiac changes from interstitial lung disease, uncontrolled HTN, DM2, possible OSA, possible ischemia predisposing. Exacerbating factors include hypokalemia/-magnesemia, CHF, possible ischemia. Rate-controlled on Toprol-XL. CHADSVASc = at least 3 (CHF, HTN, DM2), thus warranting anticoagulation.  -- Switch Toprol-XL for Coreg for rate-control. HR 70s currently. -- Favor trial of anticoagulation despite recent noncompliance with BP meds. Consider Xarelto to foster compliance- once daily dosing. Will need case management to coordinate Xarelto assistance.  -- Replete Mg, K   Plan 3. Acute combined CHF Suspect the patient's dyspnea represents more of an anginal equivalent over florid heart failure. BNP mildly elevated. He is mildly volume overloaded on exam. Clouding this somewhat is the patient's chronic tobacco abuse and CXR findings pointing to possible interstitial lung disease. Echo- EF 45-50%, inferior/apical HK, LVH, diastolic dysfunction.  -- Add ACEi/ARB, continue BB -- Start on Lasix 40mg  PO daily, continue on d/c. Will need labwork in 1 week to monitor K, renal function. ACEi/ARB may mitigate potassium wasting effects.  -- Nonpharmacologic CHF management- low Na/fluid, control BP -- Monitor daily weights, I/Os, serial BMETs, correct  electrolyte abnormalities  4. Uncontrolled DM2 with diabetic neuropathy Hgb A1C 9.2%. Bilateral foot numbness/tingling represents diabetic foot neuropathy. Renal function preserved.  -- Management per primary team. Unsure that he would be compliant with insulin.  -- ACEi/ARB as above.   4. Uncontrolled HTN w/ hypertensive cardiomyopathy SBP 180s off home antihypertensives. BP 225/115 on admission. ? Clonidine rebound.  -- Wean clonidine -- Add ACEi/ARB and titrate -- Switch Toprol-XL for Coreg- alpha activity, affordability -- Continue amlodipine  5. Ongoing tobacco abuse Willing to quit.  -- Nicotine replacement therapy- nicotine patch taper  6. Obesity -- Diet & exercise as a means of RF  reduction.   7. Abnormal chest x-ray Bronchiolitis vs interstitial lung disease. No cough, leukocytosis. Afebrile.  -- Follow-up pulmonary/PCP for further work-up. -- Smoking cessation   Electronic Signatures for Addendum Section:  Marc [name] (MD) (Signed Addendum [phone] 14:06)  The patient was seen and examined. He presented with exertional dyspnea over last few months after he stopped taking his BP medications. BP was >200 on presentation. Tni was mildly elevated. Tele showed episodes of A-fib. Now in NSR.  He feels better after controlling his BP. Echo showed mildly reduced LVSF with inferior/apical hypokinesis.  Recommend: Blood pressure control.  Ischemic risk stratification with a nulcear stress test.  Consider anticagulation for paroxysmal A-fib.  All of the above was discussed with the patient and his wife. I recommended doing work up while hospitalized but he wants to go home and follow up as an outpatient. I explained to the them the risks of MI and even death.  I will schedule him to follow up with me in 1 week. I will consider starting him on some form of anticoagulation. For now use Aspirin, BP meds and a statin.   Electronic Signatures: Odella Aquas A (PA-C)  (Signed  17-Nov-14 13:42)  Authored: General Aspect/Present Illness, History and Physical Exam, Review of System, Home Medications, Labs, EKG , Radiology, Allergies, Vital Signs/Nurse's Notes, Impression/Plan Marc [name] (MD)  (Signed 17-Nov-14 14:06)  Co-Signer: General Aspect/Present Illness, Home Medications, Allergies, Impression/Plan   Last Updated: 17-Nov-14 14:06 by Marc [name] (MD)

## 2014-12-25 NOTE — Discharge Summary (Signed)
PATIENT NAME:  Marc [name], Marc [name] MR#:  [id number] DATE OF BIRTH:  May 02, 1961  DATE OF ADMISSION:  07/21/2013 DATE OF DISCHARGE:  07/21/2013  ADMITTING DIAGNOSIS: Non-Q-wave myocardial infarction.  DISCHARGE DIAGNOSES:   1.  Non-Q-wave myocardial infarction.  2.  Cardiomyopathy with ejection fraction of 45% to 50%, with wall motion abnormality. Suspected underlying coronary artery disease. Echocardiogram with mild mitral regurgitation, tricuspid regurgitation, as well as moderate left ventricular hypertrophy.  3.  Malignant hypertension.  4. Congestive heart failure, acute diastolic.  5.  Tobacco abuse, ongoing.  6.  Anxiety.  7.  Hyperlipidemia with LDL of 66.  8.  Hypomagnesemia.  9.  Diabetes mellitus with hemoglobin A1c of 9.2.  10.  Medical noncompliance.   DISCHARGE CONDITION: Stable.   DISCHARGE MEDICATIONS: The patient is to continue clonidine 0.2 mg twice daily, atorvastatin 40 mg p.o. at bedtime, aspirin 81 mg p.o. daily, alprazolam 0.25 mg every 8 hours as needed, metoprolol succinate 100 mg p.o. twice daily, amlodipine 10 mg p.o. daily, magnesium oxide 400 mg p.o. daily, nicotine oral inhaler 1 inhalation every 1 to 2 hours as needed, metformin 1 gram twice daily, and Glucotrol XL 5 mg once daily.   HOME OXYGEN: None.   DIET: 2 gram salt, low-fat, low-cholesterol, carbohydrate-controlled diet; regular consistency.   ACTIVITY LIMITATIONS: As tolerated.   FOLLOWUP APPOINTMENTS: [facility] in 2 days after discharge as well as Dr. Kirke Corin in 1 week after discharge.   CONSULTANTS: Dr. Kirke Corin.   RADIOLOGIC STUDIES: Chest AP and lateral, 17th of November 2014, revealed mild diffuse interstitial prominence, age indeterminate, although not described in chest report in 1999, which could reflect bronchiolitis. If more chronic appearing, could be related to interstitial lung disease. Recommend followup chest x-ray to verify findings according to radiologist.   HISTORY OF PRESENT  ILLNESS: The patient is a 54 year old Caucasian male with past medical history significant for history of diabetes, hypertension, hyperlipidemia, who has been off medications for the past 2 months, comes to the hospital with complaints of shortness of breath. Please refer to Dr. Eddie North admission note on the 17th of November 2014. On arrival to the hospital, the patient's temperature was 98. Pulse was 85. Respiration rate was in the 20s. Blood pressure was 225/115. Oxygen saturations were 95% on room air. The patient had A. fib/A. flutter intermittently with rate as high as 150 intermittently. His physical exam was unremarkable. A few crackles were heard on lung exam. The patient's lab data done in the Emergency Room showed elevation of beta-type natriuretic peptide of 1957. Glucose was 192. Potassium was 3.4. Otherwise, BMP was unremarkable. Magnesium level was low at 1.6. Hemoglobin A1c was checked and was found to be 9.2. Liver enzymes were normal. Cardiac enzymes: First set of troponin was 0.09, seconds set 0.10, third set 0.12. MB fraction first set 4.6, second set 3.8. CK total was within normal limits. CBC was within normal limits. EKG showed sinus rhythm with frequent consecutive premature ventricular complexes, possible left atrial enlargement, lateral infarct age undetermined, also ST abnormality; consider inferior ischemia according to EKG criteria. The patient's chest x-ray was concerning for possible interstitial lung disease versus bronchiolitis.   The patient was admitted to the hospital for further evaluation. He was started on metoprolol, aspirin, nitroglycerin, as well as Lovenox. His cardiac enzymes were cycled. Consultation with Dr. Kirke Corin was obtained. Dr. Kirke Corin as well as his PA, Mr. Odella Aquas, discussed with patient his condition and recommended cardiac catheterization or stress testing. However, the  patient adamantly refused and decided to leave. Dr. Kirke Corin recommended to him to follow  up with him as outpatient in the next few days after discharge.   In regards to his malignant hypertension, the patient is to continue his blood pressure management with current medications. His blood pressure was well controlled on the day of discharge.   The patient was felt to have mild congestive heart failure, acute diastolic, very likely due to malignant hypertension as well as possibly intermittent A. fib/A. flutter, RVR. The patient was diuresed while he was in the hospital, and his oxygenation remained stable and he felt less short of breath.   On the day of discharge, the patient's vital signs: Temperature is 97.3. Pulse was 74. Respiratory rate was 18, blood pressure 121/81. Saturation was 96% on room air at rest. The patient is to continue to follow up with cardiologist as well as his primary care physician at [facility] for other recommendations. We did not initiate the patient on ACE inhibitors at this time, but we may need to give him ACE inhibitors if his blood pressure is not well controlled with current medications.   For anxiety, the patient is to continue Xanax.   For hyperlipidemia, we started him on higher dose of Lipitor. We checked his lipid profile, and it appears that the patient's LDL was satisfactorily controlled at 66 and his total cholesterol level was 135. Triglycerides were 152. HDL was low at 39. It is recommended to follow the patient's lipid panel as outpatient and make decisions about advancement of his medications or decreasing of his Lipitor dose if needed.   The patient was noted to be hypomagnesemic. He was initiated on magnesium supplements orally.   For diabetes mellitus with hemoglobin A1c of 9.2, metformin was advanced to 1 gram twice daily dose and glipizide was added. The patient is to continue to follow up with his primary care physician and follow his hemoglobin A1c.   In regards to the medical noncompliance, we discussed the patient's care with  him and risk stratification. However, he refused to listen and decided to make decisions by himself.   For tobacco abuse, we discussed with him about this as well and recommended nicotine replacement therapy. It is unclear if he will be compliant with this.   He is being discharged in stable condition with the above-mentioned medications and follow-up.   TIME SPENT: 40 minutes.  ____________________________ Katharina Caper, MD rv:jcm D: 07/21/2013 15:32:58 ET T: 07/21/2013 16:36:28 ET JOB#: [id number]  cc: Katharina Caper, MD, <Dictator> [facility] Muhammad A. Kirke Corin, MD Katharina Caper MD ELECTRONICALLY SIGNED 08/04/2013 19:20

## 2015-04-02 IMAGING — CT CT ANGIO CHEST-ABD-PELV
2 of 6 series · 11 of 36 positions shown, 15 images · IV contrast (omnipaque)
Comparison: CTA chest for pulmonary embolism 08/11/2013. No prior
abdominopelvic CT.

CLINICAL DATA: Acute severe periumbilical abdominal pain.
Hypotension with blood pressure 77/57. Diaphoresis. Hyperglycemia
with blood glucose level of 347. Evaluate for possible dissection.

EXAM:
CT ANGIOGRAPHY CHEST, ABDOMEN AND PELVIS
TECHNIQUE: Initially, multidetector CT imaging through the chest was performed
prior to IV contrast administration. Subsequently, CT imaging
through the chest, abdomen and pelvis was performed using the
standard protocol during bolus administration of intravenous
contrast. Multiplanar reconstructed images and MIPs were obtained
and reviewed to evaluate the vascular anatomy.
CONTRAST:  125 ml Omnipaque 350 IV.

[Series 6: arterial · axial · arterial · 0.89mm/px · z∈[+518,+1104]mm · 10 of 340 slices shown, 13 images]
[im 31/340  mediastinal]
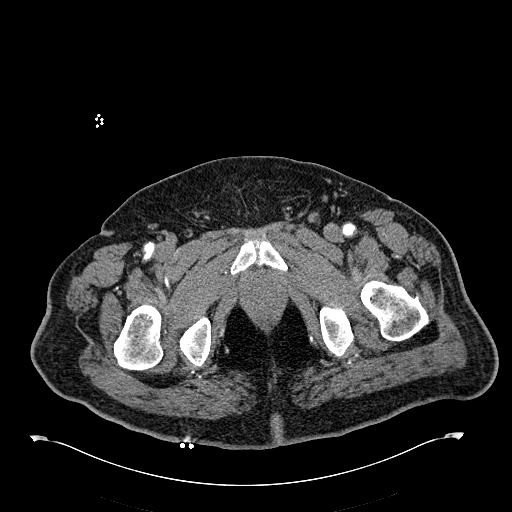
[im 31/340  bone]
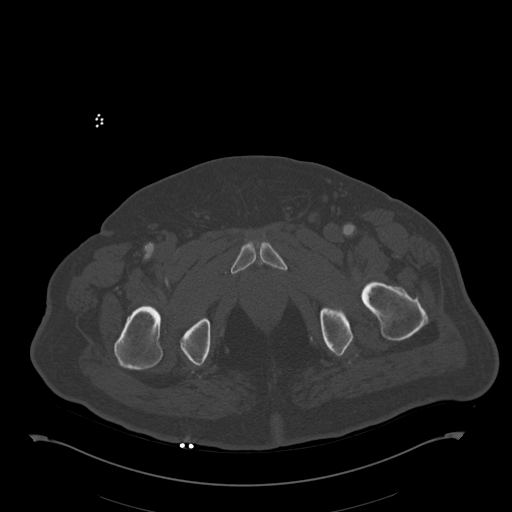
[im 62/340  mediastinal]
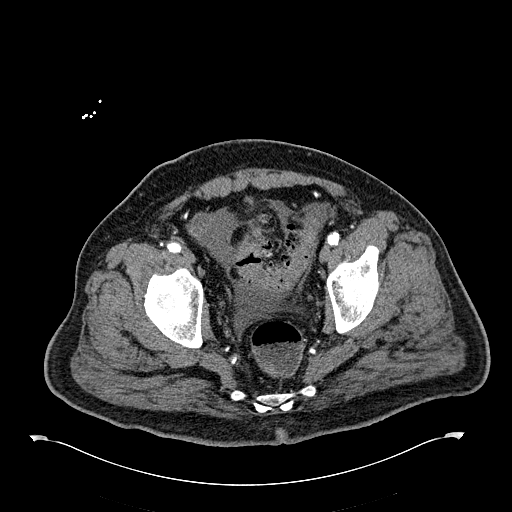
[im 108/340  mediastinal]
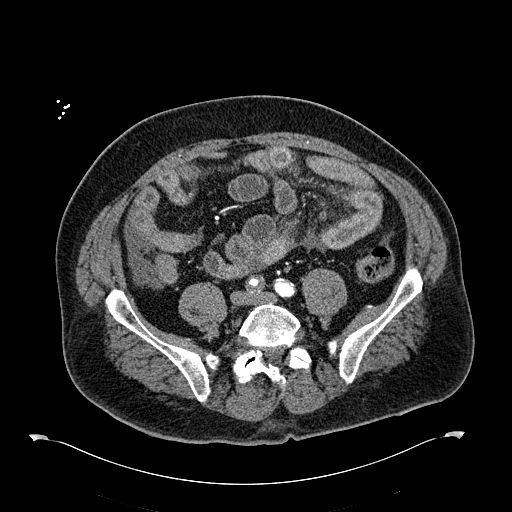
[im 155/340  mediastinal]
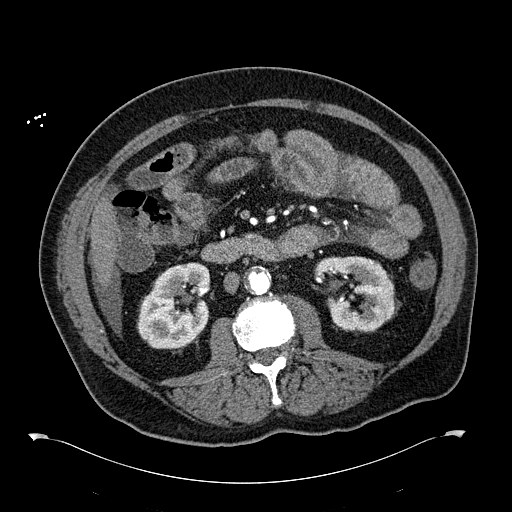
[im 185/340  mediastinal]
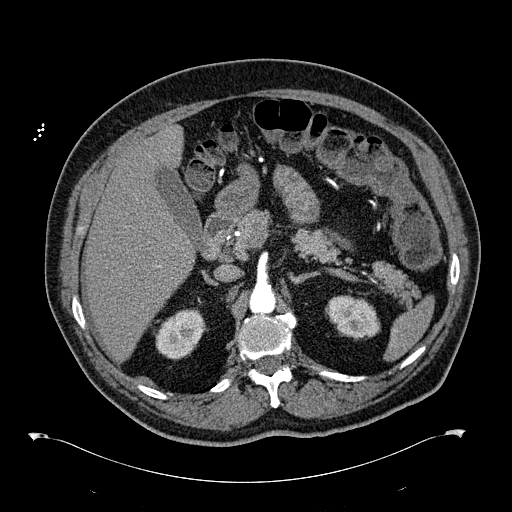
[im 232/340  mediastinal]
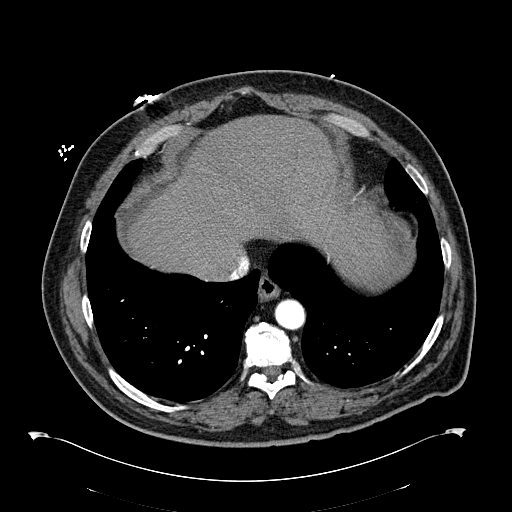
[im 278/340  mediastinal]
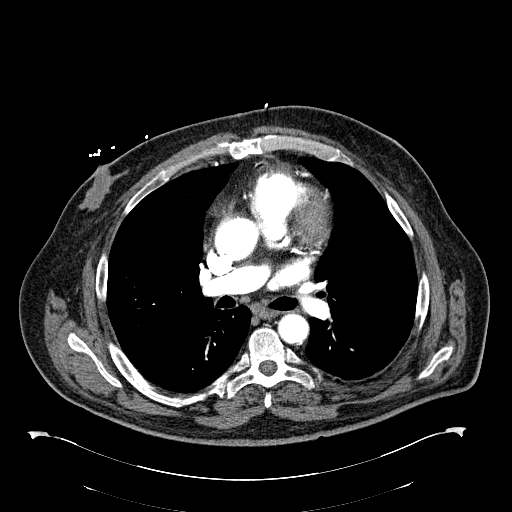
[im 278/340  lung]
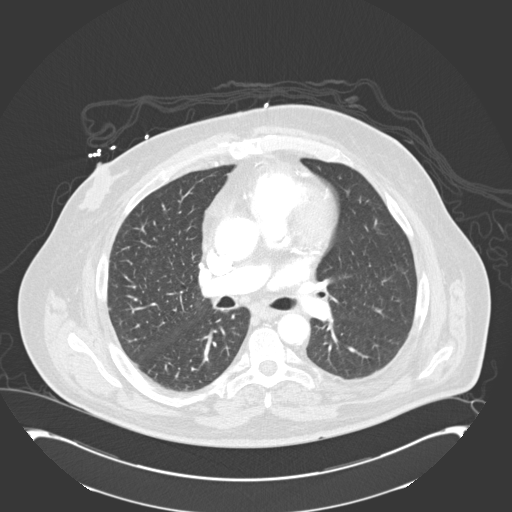
[im 293/340  lung]
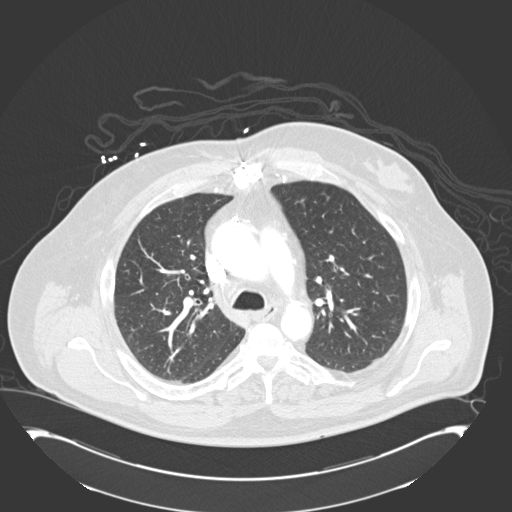
[im 309/340  mediastinal]
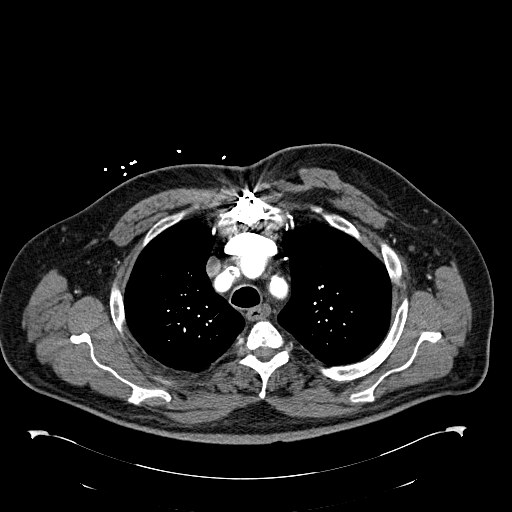
[im 309/340  lung]
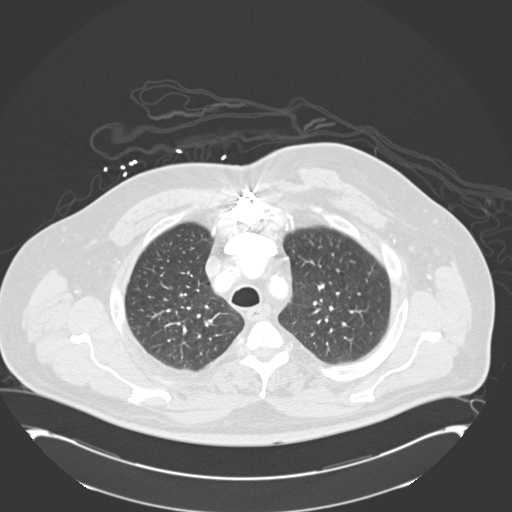
[im 324/340  lung]
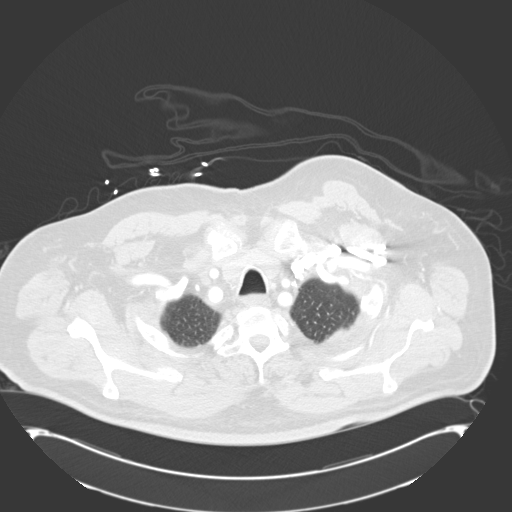

[Series 8: cor arterial mpr · coronal · arterial · 0.84mm/px · 1 of 169 slices shown, 2 images]
[im 85/169  mediastinal]
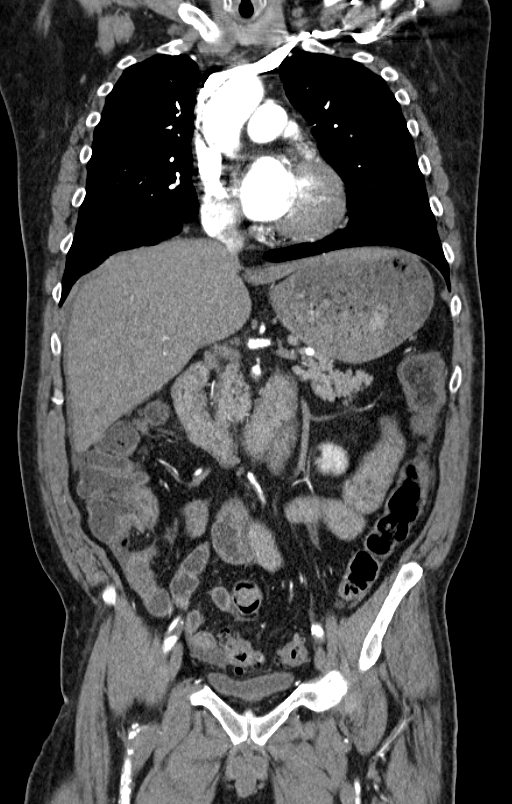
[im 85/169  bone]
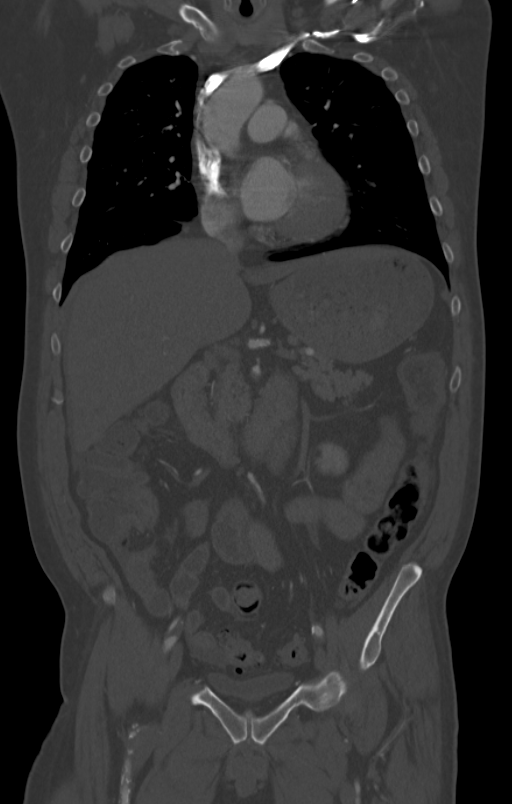

[11 of 36 positions shown; findings below may reference images not displayed]

FINDINGS: CTA CHEST FINDINGS

Unenhanced images demonstrate no evidence of mural hematoma in the
thoracic aorta. Enhanced images demonstrate no evidence of thoracic
aortic dissection. Ascending thoracic aorta upper normal in caliber
measuring approximately 3.5 cm diameter maximally; no evidence of
thoracic aortic aneurysm. Mild to moderate atherosclerosis involving
the thoracic aorta. Atherosclerosis involving the proximal great
vessels, with a moderate stenosis at the origin of the right
subclavian artery due to calcified and noncalcified plaque. Bovine
aortic arch anatomy (left common carotid artery arises from the
innominate artery).

Heart mildly enlarged with severe left ventricular hypertrophy.
Prior CABG. Though the CT is not gated, the coronary grafts appear
patent. Central pulmonary arteries patent. No pericardial effusion.

Emphysematous changes in both lungs with a peripheral bleb in the
medial left upper lobe. Pulmonary parenchyma clear without localized
airspace consolidation, interstitial disease, or parenchymal nodules
or masses. Central airways patent with mild to moderate bronchial
wall thickening. No pleural effusions.

Bone window images demonstrate mid and lower thoracic spondylosis
and the prior sternotomy.

Review of the MIP images confirms the above findings.

CTA ABDOMEN AND PELVIS FINDINGS

Focal saccular infrarenal abdominal aortic aneurysm, maximum
transverse diameter 3.0 cm. At the site of the aneurysm there is
mural thrombus and a focal dissection. Moderate stenosis at the
origin of the right common iliac artery due to calcified and
noncalcified plaque. Atherosclerosis at the origins of the celiac
artery and SMA, without visible stenosis. Patent IMA. Single renal
arteries bilaterally with moderate stenosis at the origin of the
right renal artery and no visible stenosis at the origin of the left
renal artery. Severe atherosclerosis involving the iliofemoral
arteries bilaterally with a focal high-grade stenosis in the right
external iliac artery.

Normal early arterial phase appearance of the liver, spleen,
pancreas, adrenal glands, and kidneys. Small gallstones in the
otherwise normal-appearing gallbladder. No biliary ductal dilation.
No significant lymphadenopathy.

Stomach normal in appearance, filled with food and fluid.
Normal-appearing small bowel. Extensive sigmoid colon diverticulosis
without evidence of acute diverticulitis. Focal wall thickening
involving the distal transverse and proximal and mid descending
colon. Edema in the mesenteries of the jejunum. Small amount of
ascites in the perihepatic region and dependently in the pelvis.
Liquid stool throughout much of the colon.

Prostate gland and seminal vesicles normal. Urinary bladder
decompressed and unremarkable.

Bone window images demonstrate multilevel degenerative disc disease,
spondylosis and facet degenerative changes throughout the lumbar
spine, degenerative changes in the sacroiliac joints, and
degenerative changes in the right hip.

Review of the MIP images confirms the above findings.
IMPRESSION: 1. Focal saccular infrarenal abdominal aortic aneurysm with maximum
diameter 3.0 cm, with evidence of a focal dissection at the site of
the aneurysm.
2. No evidence of thoracic aortic aneurysm or dissection.
3. Severe generalized atherosclerosis with multiple stenoses as
detailed above, most significantly a high-grade stenosis in the
right external iliac artery.
4. Wall thickening involving a several cm segment of the distal
transverse and proximal and mid descending colon. This is a
distribution that can be seen with ischemic colitis, though the IMA
branch supplying this portion of the colon does appear patent.
Therefore, infectious colitis is favored.
5. COPD/emphysema.  No acute cardiopulmonary disease.
6. Extensive sigmoid colon diverticulosis without evidence of acute
diverticulitis.
7. Mild cardiomegaly with severe left ventricular hypertrophy.

## 2015-11-14 ENCOUNTER — Emergency Department
Admission: EM | Admit: 2015-11-14 | Discharge: 2015-11-14 | Disposition: A | Discharge status: Home / Self Care | Payer: Medicaid Other | Attending: Emergency Medicine | Admitting: Emergency Medicine

## 2015-11-14 DIAGNOSIS — I10 Essential (primary) hypertension: Secondary | ICD-10-CM | POA: Diagnosis not present

## 2015-11-14 DIAGNOSIS — Z79899 Other long term (current) drug therapy: Secondary | ICD-10-CM | POA: Diagnosis not present

## 2015-11-14 DIAGNOSIS — Z7984 Long term (current) use of oral hypoglycemic drugs: Secondary | ICD-10-CM | POA: Insufficient documentation

## 2015-11-14 DIAGNOSIS — E119 Type 2 diabetes mellitus without complications: Secondary | ICD-10-CM | POA: Diagnosis not present

## 2015-11-14 DIAGNOSIS — Z7901 Long term (current) use of anticoagulants: Secondary | ICD-10-CM | POA: Diagnosis not present

## 2015-11-14 DIAGNOSIS — R509 Fever, unspecified: Secondary | ICD-10-CM | POA: Diagnosis present

## 2015-11-14 DIAGNOSIS — F17210 Nicotine dependence, cigarettes, uncomplicated: Secondary | ICD-10-CM | POA: Diagnosis not present

## 2015-11-14 DIAGNOSIS — L03115 Cellulitis of right lower limb: Secondary | ICD-10-CM | POA: Diagnosis not present

## 2015-11-14 DIAGNOSIS — L0390 Cellulitis, unspecified: Secondary | ICD-10-CM

## 2015-11-14 LAB — CBC WITH DIFFERENTIAL/PLATELET
BASOS PCT: 1 %
Basophils Absolute: 0.1 10*3/uL (ref 0–0.1)
EOS ABS: 0.3 10*3/uL (ref 0–0.7)
Eosinophils Relative: 3 %
HCT: 41.1 % (ref 40.0–52.0)
Hemoglobin: 13.9 g/dL (ref 13.0–18.0)
Lymphocytes Relative: 14 %
Lymphs Abs: 1.6 10*3/uL (ref 1.0–3.6)
MCH: 31.9 pg (ref 26.0–34.0)
MCHC: 33.7 g/dL (ref 32.0–36.0)
MCV: 94.8 fL (ref 80.0–100.0)
MONO ABS: 0.9 10*3/uL (ref 0.2–1.0)
MONOS PCT: 8 %
NEUTROS PCT: 74 %
Neutro Abs: 8.4 10*3/uL — ABNORMAL HIGH (ref 1.4–6.5)
Platelets: 237 10*3/uL (ref 150–440)
RBC: 4.34 MIL/uL — ABNORMAL LOW (ref 4.40–5.90)
RDW: 12.9 % (ref 11.5–14.5)
WBC: 11.2 10*3/uL — ABNORMAL HIGH (ref 3.8–10.6)

## 2015-11-14 LAB — COMPREHENSIVE METABOLIC PANEL
ALBUMIN: 4 g/dL (ref 3.5–5.0)
ALT: 18 U/L (ref 17–63)
ANION GAP: 7 (ref 5–15)
AST: 15 U/L (ref 15–41)
Alkaline Phosphatase: 73 U/L (ref 38–126)
BUN: 18 mg/dL (ref 6–20)
CO2: 24 mmol/L (ref 22–32)
Calcium: 9.4 mg/dL (ref 8.9–10.3)
Chloride: 101 mmol/L (ref 101–111)
Creatinine, Ser: 1 mg/dL (ref 0.61–1.24)
GFR calc Af Amer: >60 mL/min (ref >60)
GFR calc non Af Amer: >60 mL/min (ref >60)
GLUCOSE: 175 mg/dL — AB (ref 65–99)
POTASSIUM: 4.5 mmol/L (ref 3.5–5.1)
SODIUM: 132 mmol/L — AB (ref 135–145)
Total Bilirubin: 0.8 mg/dL (ref 0.3–1.2)
Total Protein: 7.5 g/dL (ref 6.5–8.1)

## 2015-11-14 LAB — PROTIME-INR
INR: 1.03
Prothrombin Time: 13.7 seconds (ref 11.4–15.0)

## 2015-11-14 MED ORDER — CEPHALEXIN 500 MG PO CAPS: 500.0000 mg | ORAL_CAPSULE | Freq: Four times a day (QID) | ORAL | Status: AC

## 2015-11-14 MED ORDER — SULFAMETHOXAZOLE-TRIMETHOPRIM 800-160 MG PO TABS: 2.0000 | ORAL_TABLET | Freq: Two times a day (BID) | ORAL | Status: DC

## 2015-11-14 MED ORDER — VANCOMYCIN HCL IN DEXTROSE 1-5 GM/200ML-% IV SOLN
1000.0000 mg | Freq: Once | INTRAVENOUS | Status: AC
  Administered 2015-11-14: 1000 mg via INTRAVENOUS
  Filled 2015-11-14: qty 200

## 2015-11-14 NOTE — Discharge Instructions (Signed)
Cellulitis °Cellulitis is an infection of the skin and the tissue under the skin. The infected area is usually red and tender. This happens most often in the arms and lower legs. °HOME CARE  °· Take your antibiotic medicine as told. Finish the medicine even if you start to feel better. °· Keep the infected arm or leg raised (elevated). °· Put a warm cloth on the area up to 4 times per day. °· Only take medicines as told by your doctor. °· Keep all doctor visits as told. °GET HELP IF: °· You see red streaks on the skin coming from the infected area. °· Your red area gets bigger or turns a dark color. °· Your bone or joint under the infected area is painful after the skin heals. °· Your infection comes back in the same area or different area. °· You have a puffy (swollen) bump in the infected area. °· You have new symptoms. °· You have a fever. °GET HELP RIGHT AWAY IF:  °· You feel very sleepy. °· You throw up (vomit) or have watery poop (diarrhea). °· You feel sick and have muscle aches and pains. °  °This information is not intended to replace advice given to you by your health care provider. Make sure you discuss any questions you have with your health care provider. °  °Document Released: 02/07/2008 Document Revised: 05/12/2015 Document Reviewed: 11/06/2011 °Elsevier Interactive Patient Education ©2016 [employer] ° °

## 2015-11-14 NOTE — ED Notes (Signed)
Pt reports diabetic ulcer on right foot, 1 st toe. Pt reports fevers since Friday but only noticed the toe being infected today. Pt is afebrile in triage.

## 2015-11-14 NOTE — ED Provider Notes (Addendum)
[facility] Emergency Department Provider Note  ____________________________________________  Time seen: Approximately 5 PM  I have reviewed the triage vital signs and the nursing notes.   HISTORY  Chief Complaint Wound Infection   HPI Marc [name] is a 55 y.o. male history of diabetes and coronary artery disease was presenting to the emergency department today with right great toe redness. He denies any pain. He says he was having fever over night over the last 2 nights. He denies any fever during the day today. He denies any cold or flu symptoms. He says that he has not checked his foot in 3 days but says he was looking for reason to have the fever. He says that today he took his sock off and noticed that he had a blister to the underside of his right great toe. There is also associated redness to the great toe as well as the foot. He says this has happened before and he was treated with antibiotics at home.   Past Medical History  Diagnosis Date  . Diabetes mellitus without complication   . Tobacco abuse   . Obesity   . Cardiomyopathy     45-50%  . CHF (congestive heart failure)   . Anxiety   . Medical non-compliance   . Chronic systolic heart failure 07/2013    Ejection fraction of 45-50% with possible inferior wall hypokinesis  . Hypertension   . Hyperlipidemia   . MI (myocardial infarction)   . Dysrhythmia   . Coronary artery disease     Significant left main and Severe three-vessel coronary artery disease in December of 2014. He underwent CABG in January of 2015 with LIMA to LAD, sequential SVG to ramus/OM 2 and SVG to OM 3    Patient Active Problem List   Diagnosis Date Noted  . Encounter for therapeutic drug monitoring 09/26/2013  . CAD (coronary artery disease) 09/16/2013  . Coronary artery disease   . Atrial fibrillation (HCC) 07/29/2013  . Hypertension   . Hyperlipidemia   . Chronic systolic heart failure (HCC) 07/05/2013     Past Surgical History  Procedure Laterality Date  . Cardiac catheterization      [facility]. no stent  . Knee surgery Right   . Lasik Bilateral   . Coronary artery bypass graft N/A 09/16/2013    Procedure: CORONARY ARTERY BYPASS GRAFTING (CABG);  Surgeon: [name], MD;  Location: [facility] OR;  Service: Open Heart Surgery;  Laterality: N/A;  CABG x four, using left internal mammary artery and right leg greater saphenous vein  . Intraoperative transesophageal echocardiogram N/A 09/16/2013    Procedure: INTRAOPERATIVE TRANSESOPHAGEAL ECHOCARDIOGRAM;  Surgeon: [name], MD;  Location: [facility] OR;  Service: Open Heart Surgery;  Laterality: N/A;    Current Outpatient Rx  Name  Route  Sig  Dispense  Refill  . ALPRAZolam (XANAX) 0.25 MG tablet   Oral   Take 0.25 mg by mouth at bedtime as needed for anxiety.         Marland Kitchen amiodarone (PACERONE) 400 MG tablet   Oral   Take 0.5 tablets (200 mg total) by mouth 2 (two) times daily.   60 tablet   1   . carvedilol (COREG) 25 MG tablet   Oral   Take 1 tablet (25 mg total) by mouth 2 (two) times daily.   60 tablet   6   . furosemide (LASIX) 40 MG tablet   Oral   Take 1 tablet (40 mg  total) by mouth daily.   30 tablet   1     Pt needs to contact office to schedule future appo ...   . glimepiride (AMARYL) 4 MG tablet   Oral   Take 1 tablet (4 mg total) by mouth daily with breakfast.   30 tablet   1   . lisinopril (PRINIVIL,ZESTRIL) 20 MG tablet   Oral   Take 1 tablet (20 mg total) by mouth daily.   30 tablet   1   . metFORMIN (GLUCOPHAGE) 1000 MG tablet   Oral   Take 1 tablet (1,000 mg total) by mouth 2 (two) times daily with a meal.   60 tablet   6   . Multiple Vitamin (MULTIVITAMIN WITH MINERALS) TABS tablet   Oral   Take 1 tablet by mouth daily.         Marland Kitchen oxyCODONE-acetaminophen (PERCOCET/ROXICET) 5-325 MG per tablet   Oral   Take 1 tablet by mouth every 8 (eight) hours as needed for severe pain.   40 tablet   0    . potassium chloride SA (K-DUR,KLOR-CON) 20 MEQ tablet   Oral   Take 1 tablet (20 mEq total) by mouth daily.   30 tablet   3   . simvastatin (ZOCOR) 20 MG tablet   Oral   Take 1 tablet (20 mg total) by mouth daily at 6 PM.   30 tablet   1   . warfarin (COUMADIN) 5 MG tablet      Take as directed by anticoagulation clinic.   40 tablet   0     30 day supply     Allergies Review of patient's allergies indicates no known allergies.  Family History  Problem Relation Age of Onset  . Heart disease Mother     Social History Social History  Substance Use Topics  . Smoking status: Current Every Day Smoker -- 1.00 packs/day for 45 years    Types: Cigarettes  . Smokeless tobacco: Never Used  . Alcohol Use: No     Comment: occassional    Review of Systems Constitutional: fever/chills Eyes: No visual changes. ENT: No sore throat. Cardiovascular: Denies chest pain. Respiratory: Denies shortness of breath. Gastrointestinal: No abdominal pain.  No nausea, no vomiting.  No diarrhea.  No constipation. Genitourinary: Negative for dysuria. Musculoskeletal: Negative for back pain. Skin: As above Neurological: Negative for headaches, focal weakness or numbness.  10-point ROS otherwise negative.  ____________________________________________   PHYSICAL EXAM:  VITAL SIGNS: ED Triage Vitals  Enc Vitals Group     BP 11/14/15 1345 179/78 mmHg     Pulse Rate 11/14/15 1345 58     Resp 11/14/15 1345 18     Temp 11/14/15 1345 98.4 F (36.9 C)     Temp src --      SpO2 11/14/15 1345 99 %     Weight --      Height --      Head Cir --      Peak Flow --      Pain Score 11/14/15 1347 4     Pain Loc --      Pain Edu? --      Excl. in GC? --     Constitutional: Alert and oriented. Well appearing and in no acute distress. Eyes: Conjunctivae are normal. PERRL. EOMI. Head: Atraumatic. Nose: No congestion/rhinnorhea. Mouth/Throat: Mucous membranes are moist.   Neck: No  stridor.   Cardiovascular: Normal rate, regular rhythm. Grossly normal heart sounds.  bilateral and equal dorsalis pedis pulses.  Respiratory: Normal respiratory effort.  No retractions. Lungs CTAB. Gastrointestinal: Soft and nontender. No distention. No abdominal bruits. No CVA tenderness. Musculoskeletal: No lower extremity tenderness nor edema.  No joint effusions. Neurologic:  Normal speech and language. No gross focal neurologic deficits are appreciated.  Skin:  right great toe with erythema. The erythema streaking from the right great toe up the dorsum of the foot just to the anterior, middle of the ankle. The streaking is in a 1 cm red strip. There is no tenderness palpation. On the plantar surface of the right great toe there is a 2 x 3 cm ruptured bulla. There is no pus. No tenderness to palpation. No edema. No fluctuance.  Psychiatric: Mood and affect are normal. Speech and behavior are normal.  ____________________________________________   LABS (all labs ordered are listed, but only abnormal results are displayed)  Labs Reviewed  CBC WITH DIFFERENTIAL/PLATELET - Abnormal; Notable for the following:    WBC 11.2 (*)    RBC 4.34 (*)    Neutro Abs 8.4 (*)    All other components within normal limits  COMPREHENSIVE METABOLIC PANEL - Abnormal; Notable for the following:    Sodium 132 (*)    Glucose, Bld 175 (*)    All other components within normal limits  PROTIME-INR   ____________________________________________  EKG   ____________________________________________  RADIOLOGY   ____________________________________________   PROCEDURES    ____________________________________________   INITIAL IMPRESSION / ASSESSMENT AND PLAN / ED COURSE  Pertinent labs & imaging results that were available during my care of the patient were reviewed by me and considered in my medical decision making (see chart for details).  ----------------------------------------- 5:31 PM on  11/14/2015 -----------------------------------------  Discussed the case with Dr. Ether Griffins who says that the patient should be able to be followed up in clinic within the week. The patient does not have any wound care right now and I feel that wound care will be essential to this wound healing properly. He will be given a dose of IV antibiotics in the emergency department and then Bactrim as well as Keflex to be discharged home with. The patient understands strict return precautions including any worsening redness, persistent fever or pain. He likely does not have any pain secondary to peripheral neuropathy at this time. He understands the plan and is willing to comply.  ____________________________________________   FINAL CLINICAL IMPRESSION(S) / ED DIAGNOSES  Cellulitis of the right great toe and foot.     Myrna Blazer, MD 11/14/15 1732  Patient says that he no longer takes Coumadin due to bleeding consultation. We'll prescribe Bactrim.  Myrna Blazer, MD 11/14/15 [phone]

## 2015-11-14 NOTE — ED Notes (Signed)
Pts wound cleaned and dressed with non-adhesive bandage. Gauze applied over bandage.

## 2016-02-16 ENCOUNTER — Ambulatory Visit: Payer: Self-pay

## 2016-02-16 DIAGNOSIS — Z5181 Encounter for therapeutic drug level monitoring: Secondary | ICD-10-CM

## 2016-02-22 ENCOUNTER — Encounter: Payer: Self-pay | Admitting: Emergency Medicine

## 2016-02-22 ENCOUNTER — Emergency Department
Admission: EM | Admit: 2016-02-22 | Discharge: 2016-02-22 | Disposition: A | Discharge status: Home / Self Care | Payer: Medicaid Other | Attending: Emergency Medicine | Admitting: Emergency Medicine

## 2016-02-22 ENCOUNTER — Emergency Department: Payer: Medicaid Other

## 2016-02-22 DIAGNOSIS — I4891 Unspecified atrial fibrillation: Secondary | ICD-10-CM | POA: Insufficient documentation

## 2016-02-22 DIAGNOSIS — Z7984 Long term (current) use of oral hypoglycemic drugs: Secondary | ICD-10-CM | POA: Insufficient documentation

## 2016-02-22 DIAGNOSIS — E785 Hyperlipidemia, unspecified: Secondary | ICD-10-CM | POA: Diagnosis not present

## 2016-02-22 DIAGNOSIS — I110 Hypertensive heart disease with heart failure: Secondary | ICD-10-CM | POA: Diagnosis not present

## 2016-02-22 DIAGNOSIS — L03031 Cellulitis of right toe: Secondary | ICD-10-CM | POA: Diagnosis not present

## 2016-02-22 DIAGNOSIS — Z951 Presence of aortocoronary bypass graft: Secondary | ICD-10-CM | POA: Diagnosis not present

## 2016-02-22 DIAGNOSIS — I252 Old myocardial infarction: Secondary | ICD-10-CM | POA: Diagnosis not present

## 2016-02-22 DIAGNOSIS — Z79899 Other long term (current) drug therapy: Secondary | ICD-10-CM | POA: Insufficient documentation

## 2016-02-22 DIAGNOSIS — E119 Type 2 diabetes mellitus without complications: Secondary | ICD-10-CM | POA: Diagnosis not present

## 2016-02-22 DIAGNOSIS — M79674 Pain in right toe(s): Secondary | ICD-10-CM | POA: Diagnosis present

## 2016-02-22 DIAGNOSIS — I5022 Chronic systolic (congestive) heart failure: Secondary | ICD-10-CM | POA: Diagnosis not present

## 2016-02-22 LAB — BASIC METABOLIC PANEL
Anion gap: 8 (ref 5–15)
BUN: 18 mg/dL (ref 6–20)
CHLORIDE: 99 mmol/L — AB (ref 101–111)
CO2: 21 mmol/L — AB (ref 22–32)
CREATININE: 1.07 mg/dL (ref 0.61–1.24)
Calcium: 9.7 mg/dL (ref 8.9–10.3)
GFR calc Af Amer: >60 mL/min (ref >60)
GFR calc non Af Amer: >60 mL/min (ref >60)
Glucose, Bld: 298 mg/dL — ABNORMAL HIGH (ref 65–99)
POTASSIUM: 4.8 mmol/L (ref 3.5–5.1)
SODIUM: 128 mmol/L — AB (ref 135–145)

## 2016-02-22 LAB — CBC WITH DIFFERENTIAL/PLATELET
Basophils Absolute: 0 10*3/uL (ref 0–0.1)
Basophils Relative: 0 %
EOS ABS: 0 10*3/uL (ref 0–0.7)
Eosinophils Relative: 0 %
HEMATOCRIT: 34.4 % — AB (ref 40.0–52.0)
HEMOGLOBIN: 12.1 g/dL — AB (ref 13.0–18.0)
LYMPHS ABS: 0.4 10*3/uL — AB (ref 1.0–3.6)
MCH: 33 pg (ref 26.0–34.0)
MCHC: 35.1 g/dL (ref 32.0–36.0)
MCV: 93.8 fL (ref 80.0–100.0)
Monocytes Absolute: 0.5 10*3/uL (ref 0.2–1.0)
NEUTROS ABS: 14 10*3/uL — AB (ref 1.4–6.5)
Platelets: 238 10*3/uL (ref 150–440)
RBC: 3.67 MIL/uL — AB (ref 4.40–5.90)
RDW: 13.2 % (ref 11.5–14.5)
WBC: 14.9 10*3/uL — AB (ref 3.8–10.6)

## 2016-02-22 MED ORDER — CLINDAMYCIN PHOSPHATE 600 MG/50ML IV SOLN
600.0000 mg | Freq: Once | INTRAVENOUS | Status: AC
  Administered 2016-02-22: 600 mg via INTRAVENOUS
  Filled 2016-02-22: qty 50

## 2016-02-22 MED ORDER — SULFAMETHOXAZOLE-TRIMETHOPRIM 800-160 MG PO TABS: 1.0000 | ORAL_TABLET | Freq: Two times a day (BID) | ORAL | Status: DC

## 2016-02-22 MED ORDER — HYDROCODONE-ACETAMINOPHEN 5-325 MG PO TABS: 1.0000 | ORAL_TABLET | ORAL | Status: DC | PRN

## 2016-02-22 MED ORDER — CEPHALEXIN 500 MG PO CAPS: 500.0000 mg | ORAL_CAPSULE | Freq: Four times a day (QID) | ORAL | Status: DC

## 2016-02-22 NOTE — ED Notes (Signed)
Right great toe red with some drainage  Red streaks going into foot

## 2016-02-22 NOTE — ED Provider Notes (Signed)
[facility] Emergency Department Provider Note   ____________________________________________  Time seen: Approximately 8:50 AM  I have reviewed the triage vital signs and the nursing notes.   HISTORY  Chief Complaint Toe Pain   HPI Marc [name] is a 55 y.o. male with PMHx of DM and diabetic neuropathy who arrives to the emergency department of right great toe pain x 1 week. He says he was seen a couple of months ago for a wound to the bottom of the great toe that never really resolved and this is the site of the current "infection". He reports erythema, edema, warmth and pain to the toe with drainage of clear fluid. Patient has had subjective fevers at home with chills so bad he "cranked the heat on in the jeep" on the way here. Blood sugar has been running in the 180s over the last two weeks with occasionally higher values. Says pain in his toe is sharp, rated at 8/10, and not relieved by advil. Today he has been experiencing pre-syncope, nausea, and malaise. He denies headache, fatigue, palpitations, and vomiting.    Past Medical History  Diagnosis Date  . Diabetes mellitus without complication (HCC)   . Tobacco abuse   . Obesity   . Cardiomyopathy (HCC)     45-50%  . CHF (congestive heart failure) (HCC)   . Anxiety   . Medical non-compliance   . Chronic systolic heart failure (HCC) 07/2013    Ejection fraction of 45-50% with possible inferior wall hypokinesis  . Hypertension   . Hyperlipidemia   . MI (myocardial infarction) (HCC)   . Dysrhythmia   . Coronary artery disease     Significant left main and Severe three-vessel coronary artery disease in December of 2014. He underwent CABG in January of 2015 with LIMA to LAD, sequential SVG to ramus/OM 2 and SVG to OM 3    Patient Active Problem List   Diagnosis Date Noted  . Encounter for therapeutic drug monitoring 09/26/2013  . CAD (coronary artery disease) 09/16/2013  . Coronary artery  disease   . Atrial fibrillation (HCC) 07/29/2013  . Hypertension   . Hyperlipidemia   . Chronic systolic heart failure (HCC) 07/05/2013    Past Surgical History  Procedure Laterality Date  . Cardiac catheterization      [facility]. no stent  . Knee surgery Right   . Lasik Bilateral   . Coronary artery bypass graft N/A 09/16/2013    Procedure: CORONARY ARTERY BYPASS GRAFTING (CABG);  Surgeon: [name], MD;  Location: [facility] OR;  Service: Open Heart Surgery;  Laterality: N/A;  CABG x four, using left internal mammary artery and right leg greater saphenous vein  . Intraoperative transesophageal echocardiogram N/A 09/16/2013    Procedure: INTRAOPERATIVE TRANSESOPHAGEAL ECHOCARDIOGRAM;  Surgeon: [name], MD;  Location: [facility] OR;  Service: Open Heart Surgery;  Laterality: N/A;    Current Outpatient Rx  Name  Route  Sig  Dispense  Refill  . ALPRAZolam (XANAX) 0.25 MG tablet   Oral   Take 0.25 mg by mouth at bedtime as needed for anxiety.         Marland Kitchen amiodarone (PACERONE) 400 MG tablet   Oral   Take 0.5 tablets (200 mg total) by mouth 2 (two) times daily.   60 tablet   1   . carvedilol (COREG) 25 MG tablet   Oral   Take 1 tablet (25 mg total) by mouth 2 (two) times daily.   60  tablet   6   . cephALEXin (KEFLEX) 500 MG capsule   Oral   Take 1 capsule (500 mg total) by mouth 4 (four) times daily.   40 capsule   0   . furosemide (LASIX) 40 MG tablet   Oral   Take 1 tablet (40 mg total) by mouth daily.   30 tablet   1     Pt needs to contact office to schedule future appo ...   . glimepiride (AMARYL) 4 MG tablet   Oral   Take 1 tablet (4 mg total) by mouth daily with breakfast.   30 tablet   1   . HYDROcodone-acetaminophen (NORCO/VICODIN) 5-325 MG tablet   Oral   Take 1 tablet by mouth every 4 (four) hours as needed for moderate pain.   20 tablet   0   . lisinopril (PRINIVIL,ZESTRIL) 20 MG tablet   Oral   Take 1 tablet (20 mg total) by mouth daily.   30  tablet   1   . metFORMIN (GLUCOPHAGE) 1000 MG tablet   Oral   Take 1 tablet (1,000 mg total) by mouth 2 (two) times daily with a meal.   60 tablet   6   . Multiple Vitamin (MULTIVITAMIN WITH MINERALS) TABS tablet   Oral   Take 1 tablet by mouth daily.         . potassium chloride SA (K-DUR,KLOR-CON) 20 MEQ tablet   Oral   Take 1 tablet (20 mEq total) by mouth daily.   30 tablet   3   . simvastatin (ZOCOR) 20 MG tablet   Oral   Take 1 tablet (20 mg total) by mouth daily at 6 PM.   30 tablet   1   . sulfamethoxazole-trimethoprim (BACTRIM DS,SEPTRA DS) 800-160 MG tablet   Oral   Take 1 tablet by mouth 2 (two) times daily.   20 tablet   0     Allergies Review of patient's allergies indicates no known allergies.  Family History  Problem Relation Age of Onset  . Heart disease Mother     Social History Social History  Substance Use Topics  . Smoking status: Current Every Day Smoker -- 1.00 packs/day for 45 years    Types: Cigarettes  . Smokeless tobacco: Never Used  . Alcohol Use: No     Comment: occassional    Review of Systems Constitutional: Positive fever/chills Cardiovascular: Denies chest pain. Respiratory: Denies shortness of breath. Gastrointestinal:   No nausea, no vomiting.  Genitourinary: Negative for dysuria. Musculoskeletal: Positive right toe pain Skin: Erythematous right great toe. Neurological: Negative for headaches, focal weakness or numbness.  10-point ROS otherwise negative.  ____________________________________________   PHYSICAL EXAM:  VITAL SIGNS: ED Triage Vitals  Enc Vitals Group     BP 02/22/16 0836 160/83 mmHg     Pulse Rate 02/22/16 0836 85     Resp 02/22/16 0836 22     Temp 02/22/16 0836 100.6 F (38.1 C)     Temp Source 02/22/16 0836 Oral     SpO2 02/22/16 0836 100 %     Weight 02/22/16 0836 240 lb (108.863 kg)     Height 02/22/16 0836 6\' 1"  (1.854 m)     Head Cir --      Peak Flow --      Pain Score --       Pain Loc --      Pain Edu? --      Excl. in GC? --  Constitutional: Alert and oriented. Well appearing and in no acute distress. Eyes: Conjunctivae are normal. PERRL. EOMI. Head: Atraumatic. Nose: No congestion/rhinnorhea. Neck: No stridor.   Cardiovascular: Normal rate, regular rhythm. Grossly normal heart sounds.  Good peripheral circulation. Respiratory: Normal respiratory effort.  No retractions. Lungs CTAB. Gastrointestinal: Soft and nontender. No distention.  Musculoskeletal: Moves upper and lower extremities with a difficulty. Right great toe there is marked erythema and tenderness to palpation. There is no active drainage noted at this time. Nail is intact. There is a callus/did skin moderate buildup on the dorsal aspect of the right great toe. There is no drainage from this area. Neurologic:  Normal speech and language. No gross focal neurologic deficits are appreciated. No gait instability. Skin:  Skin is warm, dry and intact. No rash noted. Psychiatric: Mood and affect are normal. Speech and behavior are normal.  ____________________________________________   LABS (all labs ordered are listed, but only abnormal results are displayed)  Labs Reviewed  CBC WITH DIFFERENTIAL/PLATELET - Abnormal; Notable for the following:    WBC 14.9 (*)    RBC 3.67 (*)    Hemoglobin 12.1 (*)    HCT 34.4 (*)    Neutro Abs 14.0 (*)    Lymphs Abs 0.4 (*)    All other components within normal limits  BASIC METABOLIC PANEL - Abnormal; Notable for the following:    Sodium 128 (*)    Chloride 99 (*)    CO2 21 (*)    Glucose, Bld 298 (*)    All other components within normal limits    RADIOLOGY  X-ray of the right toe shows calcified peripheral vascular disease in the right foot with generalized soft tissue swelling but no evidence of osteomyelitis. I, Tommi Rumps, personally viewed and evaluated these images (plain radiographs) as part of my medical decision making, as well as  reviewing the written report by the radiologist. ____________________________________________   PROCEDURES  Procedure(s) performed: None  Critical Care performed: No  ____________________________________________   INITIAL IMPRESSION / ASSESSMENT AND PLAN / ED COURSE  Pertinent labs & imaging results that were available during my care of the patient were reviewed by me and considered in my medical decision making (see chart for details).  Patient received clindamycin while in the emergency room. He was given a prescription for Norco as needed for pain, Bactrim DS twice a day for 10 days and Keflex 500 mg 4 times a day for 10 days. Patient is encouraged to follow-up with Dr. Racheal Patches podiatrist on call today. Patient states that he is not seen a podiatrist in a couple years. He is also aware that he needs to pay closer attention to his diabetes and diet at this time with the infection. He is return to the emergency room if any severe worsening of his symptoms and continue taking his medication diligently. ____________________________________________   FINAL CLINICAL IMPRESSION(S) / ED DIAGNOSES  Final diagnoses:  Cellulitis of great toe, right      NEW MEDICATIONS STARTED DURING THIS VISIT:  Discharge Medication List as of 02/22/2016 12:59 PM    START taking these medications   Details  cephALEXin (KEFLEX) 500 MG capsule Take 1 capsule (500 mg total) by mouth 4 (four) times daily., Starting 02/22/2016, Until Discontinued, Print    HYDROcodone-acetaminophen (NORCO/VICODIN) 5-325 MG tablet Take 1 tablet by mouth every 4 (four) hours as needed for moderate pain., Starting 02/22/2016, Until Discontinued, Print         Note:  This document  was prepared using Conservation officer, historic buildings and may include unintentional dictation errors.    Tommi Rumps, PA-C 02/22/16 1308

## 2016-02-22 NOTE — ED Notes (Signed)
States he developed pain with redness and swelling to right great toe about 4-5 days ago  Also low grade fever

## 2016-02-22 NOTE — Discharge Instructions (Signed)
Cellulitis Cellulitis is an infection of the skin and the tissue under the skin. The infected area is usually red and tender. This happens most often in the arms and lower legs. HOME CARE   Take your antibiotic medicine as told. Finish the medicine even if you start to feel better.  Keep the infected arm or leg raised (elevated).  Put a warm cloth on the area up to 4 times per day.  Only take medicines as told by your doctor.  Keep all doctor visits as told. GET HELP IF:  You see red streaks on the skin coming from the infected area.  Your red area gets bigger or turns a dark color.  Your bone or joint under the infected area is painful after the skin heals.  Your infection comes back in the same area or different area.  You have a puffy (swollen) bump in the infected area.  You have new symptoms.  You have a fever. GET HELP RIGHT AWAY IF:   You feel very sleepy.  You throw up (vomit) or have watery poop (diarrhea).  You feel sick and have muscle aches and pains.   This information is not intended to replace advice given to you by your health care provider. Make sure you discuss any questions you have with your health care provider.   Document Released: 02/07/2008 Document Revised: 05/12/2015 Document Reviewed: 11/06/2011 Elsevier Interactive Patient Education 2016 [employer].   Follow-up with Dr. Orland Jarred for podiatry. Call and make an appointment for his office. Begin taking the antibiotics Bactrim DS twice a day for 10 days and Keflex 500 mg 4 times a day for 10 days. He may also take Norco as needed for pain.

## 2016-02-29 ENCOUNTER — Inpatient Hospital Stay
Admission: AD | Admit: 2016-02-29 | Discharge: 2016-03-02 | DRG: 617 | Disposition: A | Discharge status: Home / Self Care | Payer: Medicaid Other | Source: Ambulatory Visit | Attending: Internal Medicine | Admitting: Internal Medicine

## 2016-02-29 ENCOUNTER — Encounter: Payer: Self-pay | Admitting: Internal Medicine

## 2016-02-29 ENCOUNTER — Inpatient Hospital Stay: Payer: Medicaid Other | Admitting: Anesthesiology

## 2016-02-29 ENCOUNTER — Encounter
Admission: AD | Disposition: A | Discharge status: Home / Self Care | Payer: Self-pay | Source: Ambulatory Visit | Attending: Internal Medicine

## 2016-02-29 DIAGNOSIS — I110 Hypertensive heart disease with heart failure: Secondary | ICD-10-CM | POA: Diagnosis present

## 2016-02-29 DIAGNOSIS — I429 Cardiomyopathy, unspecified: Secondary | ICD-10-CM | POA: Diagnosis present

## 2016-02-29 DIAGNOSIS — I252 Old myocardial infarction: Secondary | ICD-10-CM | POA: Diagnosis not present

## 2016-02-29 DIAGNOSIS — I2510 Atherosclerotic heart disease of native coronary artery without angina pectoris: Secondary | ICD-10-CM | POA: Diagnosis present

## 2016-02-29 DIAGNOSIS — M869 Osteomyelitis, unspecified: Secondary | ICD-10-CM | POA: Diagnosis present

## 2016-02-29 DIAGNOSIS — Z79899 Other long term (current) drug therapy: Secondary | ICD-10-CM

## 2016-02-29 DIAGNOSIS — Z7984 Long term (current) use of oral hypoglycemic drugs: Secondary | ICD-10-CM

## 2016-02-29 DIAGNOSIS — E669 Obesity, unspecified: Secondary | ICD-10-CM | POA: Diagnosis present

## 2016-02-29 DIAGNOSIS — L03031 Cellulitis of right toe: Secondary | ICD-10-CM | POA: Diagnosis present

## 2016-02-29 DIAGNOSIS — I5022 Chronic systolic (congestive) heart failure: Secondary | ICD-10-CM | POA: Diagnosis present

## 2016-02-29 DIAGNOSIS — Z794 Long term (current) use of insulin: Secondary | ICD-10-CM

## 2016-02-29 DIAGNOSIS — Z8249 Family history of ischemic heart disease and other diseases of the circulatory system: Secondary | ICD-10-CM

## 2016-02-29 DIAGNOSIS — E1169 Type 2 diabetes mellitus with other specified complication: Secondary | ICD-10-CM | POA: Diagnosis present

## 2016-02-29 DIAGNOSIS — F17210 Nicotine dependence, cigarettes, uncomplicated: Secondary | ICD-10-CM | POA: Diagnosis present

## 2016-02-29 DIAGNOSIS — Z951 Presence of aortocoronary bypass graft: Secondary | ICD-10-CM | POA: Diagnosis not present

## 2016-02-29 DIAGNOSIS — E1152 Type 2 diabetes mellitus with diabetic peripheral angiopathy with gangrene: Secondary | ICD-10-CM | POA: Diagnosis present

## 2016-02-29 DIAGNOSIS — E1140 Type 2 diabetes mellitus with diabetic neuropathy, unspecified: Secondary | ICD-10-CM | POA: Diagnosis present

## 2016-02-29 HISTORY — PX: AMPUTATION TOE: SHX6595

## 2016-02-29 LAB — COMPREHENSIVE METABOLIC PANEL
ALK PHOS: 77 U/L (ref 38–126)
ALT: 89 U/L — AB (ref 17–63)
AST: 39 U/L (ref 15–41)
Albumin: 4.2 g/dL (ref 3.5–5.0)
Anion gap: 11 (ref 5–15)
BILIRUBIN TOTAL: 0.4 mg/dL (ref 0.3–1.2)
BUN: 31 mg/dL — ABNORMAL HIGH (ref 6–20)
CALCIUM: 10.9 mg/dL — AB (ref 8.9–10.3)
CO2: 20 mmol/L — AB (ref 22–32)
Chloride: 100 mmol/L — ABNORMAL LOW (ref 101–111)
Creatinine, Ser: 1.47 mg/dL — ABNORMAL HIGH (ref 0.61–1.24)
GFR, EST AFRICAN AMERICAN: 60 mL/min — AB (ref >60)
GFR, EST NON AFRICAN AMERICAN: 52 mL/min — AB (ref >60)
Glucose, Bld: 134 mg/dL — ABNORMAL HIGH (ref 65–99)
Potassium: 5.1 mmol/L (ref 3.5–5.1)
SODIUM: 131 mmol/L — AB (ref 135–145)
Total Protein: 8.5 g/dL — ABNORMAL HIGH (ref 6.5–8.1)

## 2016-02-29 LAB — GLUCOSE, CAPILLARY
GLUCOSE-CAPILLARY: 129 mg/dL — AB (ref 65–99)
GLUCOSE-CAPILLARY: 126 mg/dL — AB (ref 65–99)
Glucose-Capillary: 131 mg/dL — ABNORMAL HIGH (ref 65–99)

## 2016-02-29 LAB — CBC WITH DIFFERENTIAL/PLATELET
Basophils Absolute: 0.1 10*3/uL (ref 0–0.1)
Basophils Relative: 1 %
Eosinophils Absolute: 0.3 10*3/uL (ref 0–0.7)
Eosinophils Relative: 2 %
HEMATOCRIT: 37.2 % — AB (ref 40.0–52.0)
HEMOGLOBIN: 12.9 g/dL — AB (ref 13.0–18.0)
LYMPHS ABS: 2.4 10*3/uL (ref 1.0–3.6)
MCH: 32.2 pg (ref 26.0–34.0)
MCHC: 34.6 g/dL (ref 32.0–36.0)
MCV: 93.1 fL (ref 80.0–100.0)
Monocytes Absolute: 0.7 10*3/uL (ref 0.2–1.0)
NEUTROS ABS: 11.8 10*3/uL — AB (ref 1.4–6.5)
Platelets: 445 10*3/uL — ABNORMAL HIGH (ref 150–440)
RBC: 3.99 MIL/uL — AB (ref 4.40–5.90)
RDW: 13.6 % (ref 11.5–14.5)
WBC: 15.2 10*3/uL — AB (ref 3.8–10.6)

## 2016-02-29 LAB — SURGICAL PCR SCREEN
MRSA, PCR: NEGATIVE
Staphylococcus aureus: NEGATIVE

## 2016-02-29 SURGERY — AMPUTATION, TOE
Anesthesia: General | Site: Toe | Laterality: Right | Wound class: Dirty or Infected

## 2016-02-29 MED ORDER — INSULIN ASPART 100 UNIT/ML ~~LOC~~ SOLN
0.0000 [IU] | Freq: Three times a day (TID) | SUBCUTANEOUS | Status: DC
  Administered 2016-03-01: 8 [IU] via SUBCUTANEOUS
  Administered 2016-03-01 (×2): 5 [IU] via SUBCUTANEOUS
  Administered 2016-03-02: 8 [IU] via SUBCUTANEOUS
  Filled 2016-02-29: qty 8
  Filled 2016-02-29 (×2): qty 5
  Filled 2016-02-29: qty 8
  Filled 2016-02-29: qty 5

## 2016-02-29 MED ORDER — ACETAMINOPHEN 325 MG PO TABS: 650.0000 mg | ORAL_TABLET | Freq: Four times a day (QID) | ORAL | Status: DC | PRN

## 2016-02-29 MED ORDER — ALPRAZOLAM 0.25 MG PO TABS
0.2500 mg | ORAL_TABLET | Freq: Every evening | ORAL | Status: DC | PRN
  Administered 2016-02-29 – 2016-03-01 (×2): 0.25 mg via ORAL
  Filled 2016-02-29 (×3): qty 1

## 2016-02-29 MED ORDER — MORPHINE SULFATE (PF) 2 MG/ML IV SOLN: 2.0000 mg | INTRAVENOUS | Status: DC | PRN

## 2016-02-29 MED ORDER — SIMVASTATIN 20 MG PO TABS
20.0000 mg | ORAL_TABLET | Freq: Every day | ORAL | Status: DC
  Administered 2016-03-01: 20 mg via ORAL
  Filled 2016-02-29: qty 1

## 2016-02-29 MED ORDER — SODIUM CHLORIDE 0.9 % IV SOLN
INTRAVENOUS | Status: DC
  Administered 2016-02-29 – 2016-03-02 (×4): via INTRAVENOUS

## 2016-02-29 MED ORDER — PROPOFOL 500 MG/50ML IV EMUL
INTRAVENOUS | Status: DC | PRN
  Administered 2016-02-29: 50 ug/kg/min via INTRAVENOUS

## 2016-02-29 MED ORDER — ENOXAPARIN SODIUM 40 MG/0.4ML ~~LOC~~ SOLN
40.0000 mg | SUBCUTANEOUS | Status: DC
Start: 2016-02-29 — End: 2016-03-02
  Administered 2016-03-01: 40 mg via SUBCUTANEOUS
  Filled 2016-02-29: qty 0.4

## 2016-02-29 MED ORDER — ALBUTEROL SULFATE (2.5 MG/3ML) 0.083% IN NEBU: 2.5000 mg | INHALATION_SOLUTION | RESPIRATORY_TRACT | Status: DC | PRN

## 2016-02-29 MED ORDER — POLYETHYLENE GLYCOL 3350 17 G PO PACK: 17.0000 g | PACK | Freq: Every day | ORAL | Status: DC | PRN

## 2016-02-29 MED ORDER — FENTANYL CITRATE (PF) 100 MCG/2ML IJ SOLN: 25.0000 ug | INTRAMUSCULAR | Status: DC | PRN

## 2016-02-29 MED ORDER — LIDOCAINE HCL (PF) 1 % IJ SOLN
INTRAMUSCULAR | Status: AC
Start: 2016-02-29 — End: 2016-02-29
  Filled 2016-02-29: qty 30

## 2016-02-29 MED ORDER — POTASSIUM CHLORIDE CRYS ER 20 MEQ PO TBCR
20.0000 meq | EXTENDED_RELEASE_TABLET | Freq: Every day | ORAL | Status: DC
  Administered 2016-03-01 – 2016-03-02 (×2): 20 meq via ORAL
  Filled 2016-02-29 (×2): qty 1

## 2016-02-29 MED ORDER — ONDANSETRON HCL 4 MG/2ML IJ SOLN: 4.0000 mg | Freq: Four times a day (QID) | INTRAMUSCULAR | Status: DC | PRN

## 2016-02-29 MED ORDER — GLIMEPIRIDE 2 MG PO TABS
4.0000 mg | ORAL_TABLET | Freq: Every day | ORAL | Status: DC
  Administered 2016-03-01 – 2016-03-02 (×2): 4 mg via ORAL
  Filled 2016-02-29 (×2): qty 2

## 2016-02-29 MED ORDER — BUPIVACAINE HCL 0.5 % IJ SOLN
INTRAMUSCULAR | Status: DC | PRN
  Administered 2016-02-29: 10 mL

## 2016-02-29 MED ORDER — LISINOPRIL 20 MG PO TABS
20.0000 mg | ORAL_TABLET | Freq: Every day | ORAL | Status: DC
  Administered 2016-03-01 – 2016-03-02 (×2): 20 mg via ORAL
  Filled 2016-02-29 (×2): qty 1

## 2016-02-29 MED ORDER — INSULIN GLARGINE 100 UNIT/ML ~~LOC~~ SOLN
26.0000 [IU] | Freq: Every day | SUBCUTANEOUS | Status: DC
  Administered 2016-02-29: 26 [IU] via SUBCUTANEOUS
  Filled 2016-02-29 (×3): qty 0.26

## 2016-02-29 MED ORDER — FUROSEMIDE 40 MG PO TABS
40.0000 mg | ORAL_TABLET | Freq: Every day | ORAL | Status: DC
  Administered 2016-03-01 – 2016-03-02 (×2): 40 mg via ORAL
  Filled 2016-02-29 (×2): qty 1

## 2016-02-29 MED ORDER — PIPERACILLIN-TAZOBACTAM 4.5 G IVPB
4.5000 g | Freq: Three times a day (TID) | INTRAVENOUS | Status: DC
  Administered 2016-02-29 – 2016-03-02 (×6): 4.5 g via INTRAVENOUS
  Filled 2016-02-29 (×9): qty 100

## 2016-02-29 MED ORDER — VANCOMYCIN HCL 10 G IV SOLR
1500.0000 mg | Freq: Once | INTRAVENOUS | Status: DC
  Filled 2016-02-29: qty 1500

## 2016-02-29 MED ORDER — BISACODYL 5 MG PO TBEC: 5.0000 mg | DELAYED_RELEASE_TABLET | Freq: Every day | ORAL | Status: DC | PRN

## 2016-02-29 MED ORDER — CARVEDILOL 6.25 MG PO TABS
25.0000 mg | ORAL_TABLET | Freq: Two times a day (BID) | ORAL | Status: DC
  Administered 2016-02-29 – 2016-03-02 (×4): 25 mg via ORAL
  Filled 2016-02-29 (×4): qty 4

## 2016-02-29 MED ORDER — BUPIVACAINE HCL (PF) 0.5 % IJ SOLN
INTRAMUSCULAR | Status: AC
  Filled 2016-02-29: qty 30

## 2016-02-29 MED ORDER — ACETAMINOPHEN 650 MG RE SUPP: 650.0000 mg | Freq: Four times a day (QID) | RECTAL | Status: DC | PRN

## 2016-02-29 MED ORDER — VANCOMYCIN HCL 10 G IV SOLR
1250.0000 mg | Freq: Once | INTRAVENOUS | Status: AC
  Administered 2016-02-29: 1250 mg via INTRAVENOUS
  Filled 2016-02-29: qty 1250

## 2016-02-29 MED ORDER — LORAZEPAM 2 MG/ML IJ SOLN: 0.5000 mg | Freq: Once | INTRAMUSCULAR | Status: DC

## 2016-02-29 MED ORDER — VANCOMYCIN HCL 10 G IV SOLR
1250.0000 mg | Freq: Two times a day (BID) | INTRAVENOUS | Status: DC
  Administered 2016-02-29 – 2016-03-02 (×3): 1250 mg via INTRAVENOUS
  Filled 2016-02-29 (×5): qty 1250

## 2016-02-29 MED ORDER — ONDANSETRON HCL 4 MG PO TABS: 4.0000 mg | ORAL_TABLET | Freq: Four times a day (QID) | ORAL | Status: DC | PRN

## 2016-02-29 MED ORDER — OXYCODONE HCL 5 MG PO TABS: 5.0000 mg | ORAL_TABLET | Freq: Once | ORAL | Status: DC | PRN

## 2016-02-29 MED ORDER — AMIODARONE HCL 200 MG PO TABS
200.0000 mg | ORAL_TABLET | Freq: Two times a day (BID) | ORAL | Status: DC
  Administered 2016-02-29 – 2016-03-02 (×4): 200 mg via ORAL
  Filled 2016-02-29 (×4): qty 1

## 2016-02-29 MED ORDER — MIDAZOLAM HCL 2 MG/2ML IJ SOLN
INTRAMUSCULAR | Status: DC | PRN
  Administered 2016-02-29 (×2): 1 mg via INTRAVENOUS

## 2016-02-29 MED ORDER — FENTANYL CITRATE (PF) 100 MCG/2ML IJ SOLN
INTRAMUSCULAR | Status: DC | PRN
  Administered 2016-02-29: 50 ug via INTRAVENOUS

## 2016-02-29 MED ORDER — PROPOFOL 10 MG/ML IV BOLUS
INTRAVENOUS | Status: DC | PRN
  Administered 2016-02-29: 100 mg via INTRAVENOUS

## 2016-02-29 MED ORDER — NEOMYCIN-POLYMYXIN B GU 40-200000 IR SOLN
Status: AC
  Filled 2016-02-29: qty 2

## 2016-02-29 MED ORDER — OXYCODONE HCL 5 MG/5ML PO SOLN: 5.0000 mg | Freq: Once | ORAL | Status: DC | PRN

## 2016-02-29 MED ORDER — NEOMYCIN-POLYMYXIN B GU 40-200000 IR SOLN
Status: DC | PRN
  Administered 2016-02-29: 2 mL

## 2016-02-29 MED ORDER — HYDROCODONE-ACETAMINOPHEN 5-325 MG PO TABS
1.0000 | ORAL_TABLET | ORAL | Status: DC | PRN
  Administered 2016-03-01 (×2): 2 via ORAL
  Filled 2016-02-29 (×2): qty 2

## 2016-02-29 SURGICAL SUPPLY — 43 items
BANDAGE ACE 4X5 VEL STRL LF (GAUZE/BANDAGES/DRESSINGS) ×3 IMPLANT
BLADE MED AGGRESSIVE (BLADE) IMPLANT
BLADE OSC/SAGITTAL MD 5.5X18 (BLADE) ×3 IMPLANT
BLADE SURG 15 STRL LF DISP TIS (BLADE) ×2 IMPLANT
BLADE SURG 15 STRL SS (BLADE) ×4
BLADE SURG MINI STRL (BLADE) IMPLANT
BNDG ESMARK 4X12 TAN STRL LF (GAUZE/BANDAGES/DRESSINGS) ×3 IMPLANT
BNDG GAUZE 4.5X4.1 6PLY STRL (MISCELLANEOUS) ×3 IMPLANT
CANISTER SUCT 1200ML W/VALVE (MISCELLANEOUS) ×3 IMPLANT
CLOSURE WOUND 1/4X4 (GAUZE/BANDAGES/DRESSINGS) ×1
CUFF TOURN 18 STER (MISCELLANEOUS) ×3 IMPLANT
CUFF TOURN DUAL PL 12 NO SLV (MISCELLANEOUS) IMPLANT
DRAPE FLUOR MINI C-ARM 54X84 (DRAPES) IMPLANT
DURAPREP 26ML APPLICATOR (WOUND CARE) ×3 IMPLANT
ELECT REM PT RETURN 9FT ADLT (ELECTROSURGICAL) ×3
ELECTRODE REM PT RTRN 9FT ADLT (ELECTROSURGICAL) ×1 IMPLANT
GAUZE PETRO XEROFOAM 1X8 (MISCELLANEOUS) ×3 IMPLANT
GAUZE SPONGE 4X4 12PLY STRL (GAUZE/BANDAGES/DRESSINGS) ×3 IMPLANT
GAUZE STRETCH 2X75IN STRL (MISCELLANEOUS) ×3 IMPLANT
GLOVE BIO SURGEON STRL SZ7.5 (GLOVE) ×6 IMPLANT
GLOVE INDICATOR 8.0 STRL GRN (GLOVE) ×6 IMPLANT
GOWN STRL REUS W/ TWL LRG LVL3 (GOWN DISPOSABLE) ×2 IMPLANT
GOWN STRL REUS W/TWL LRG LVL3 (GOWN DISPOSABLE) ×4
HANDPIECE VERSAJET DEBRIDEMENT (MISCELLANEOUS) IMPLANT
KIT RM TURNOVER STRD PROC AR (KITS) ×3 IMPLANT
LABEL OR SOLS (LABEL) ×3 IMPLANT
NEEDLE FILTER BLUNT 18X 1/2SAF (NEEDLE) ×2
NEEDLE FILTER BLUNT 18X1 1/2 (NEEDLE) ×1 IMPLANT
NEEDLE HYPO 25X1 1.5 SAFETY (NEEDLE) ×6 IMPLANT
NS IRRIG 500ML POUR BTL (IV SOLUTION) ×3 IMPLANT
PACK EXTREMITY ARMC (MISCELLANEOUS) ×3 IMPLANT
SOL .9 NS 3000ML IRR  AL (IV SOLUTION)
SOL .9 NS 3000ML IRR UROMATIC (IV SOLUTION) IMPLANT
SOL PREP PVP 2OZ (MISCELLANEOUS) ×3
SOLUTION PREP PVP 2OZ (MISCELLANEOUS) ×1 IMPLANT
STOCKINETTE STRL 6IN 960660 (GAUZE/BANDAGES/DRESSINGS) ×3 IMPLANT
STRIP CLOSURE SKIN 1/4X4 (GAUZE/BANDAGES/DRESSINGS) ×2 IMPLANT
SUT ETHILON 4-0 (SUTURE) ×4
SUT ETHILON 4-0 FS2 18XMFL BLK (SUTURE) ×2
SUT ETHILON 5-0 FS-2 18 BLK (SUTURE) IMPLANT
SUTURE ETHLN 4-0 FS2 18XMF BLK (SUTURE) ×2 IMPLANT
SWAB DUAL CULTURE TRANS RED ST (MISCELLANEOUS) IMPLANT
SYRINGE 10CC LL (SYRINGE) ×3 IMPLANT

## 2016-02-29 NOTE — H&P (View-Only) (Signed)
Reason for Consult: Abscess with cellulitis and osteomyelitis right hallux Referring Physician: [facility]  Marc [name] is an 55 y.o. male.  HPI: Marc [name] relates a history of ulceration beneath his right great toe over the last 6 months. Previously was seen in the emergency department about 3 months ago. Continued to have chronic problems with the ulceration. Recently about a week ago developed fever and chills and did get back to the emergency department. He was placed on oral antibiotics as there was no clear evidence of any bone infection. After a couple of days he started to feel a little bit better. He was seen outpatient in our office today with obvious gangrenous changes with early osteomyelitis on x-ray and was referred for admission and amputation of his right great toe  Past Medical History  Diagnosis Date  . Diabetes mellitus without complication (HCC)   . Tobacco abuse   . Obesity   . Cardiomyopathy (HCC)     45-50%  . CHF (congestive heart failure) (HCC)   . Anxiety   . Medical non-compliance   . Chronic systolic heart failure (HCC) 07/2013    Ejection fraction of 45-50% with possible inferior wall hypokinesis  . Hypertension   . Hyperlipidemia   . MI (myocardial infarction) (HCC)   . Dysrhythmia   . Coronary artery disease     Significant left main and Severe three-vessel coronary artery disease in December of 2014. He underwent CABG in January of 2015 with LIMA to LAD, sequential SVG to ramus/OM 2 and SVG to OM 3    Past Surgical History  Procedure Laterality Date  . Cardiac catheterization      [facility]. no stent  . Knee surgery Right   . Lasik Bilateral   . Coronary artery bypass graft N/A 09/16/2013    Procedure: CORONARY ARTERY BYPASS GRAFTING (CABG);  Surgeon: [name], MD;  Location: [facility] OR;  Service: Open Heart Surgery;  Laterality: N/A;  CABG x four, using left internal mammary artery and right leg greater saphenous vein  .  Intraoperative transesophageal echocardiogram N/A 09/16/2013    Procedure: INTRAOPERATIVE TRANSESOPHAGEAL ECHOCARDIOGRAM;  Surgeon: [name], MD;  Location: [facility] OR;  Service: Open Heart Surgery;  Laterality: N/A;    Family History  Problem Relation Age of Onset  . Heart disease Mother     Social History:  reports that he has been smoking Cigarettes.  He has a 45 pack-year smoking history. He has never used smokeless tobacco. He reports that he does not drink alcohol or use illicit drugs.  Allergies: No Known Allergies  Medications:  Scheduled: . amiodarone  200 mg Oral BID  . carvedilol  25 mg Oral BID  . enoxaparin (LOVENOX) injection  40 mg Subcutaneous Q24H  . [START ON 03/01/2016] furosemide  40 mg Oral Daily  . [START ON 03/01/2016] glimepiride  4 mg Oral Q breakfast  . insulin aspart  0-15 Units Subcutaneous TID WC  . insulin glargine  26 Units Subcutaneous QHS  . [START ON 03/01/2016] lisinopril  20 mg Oral Daily  . LORazepam  0.5 mg Intravenous Once  . piperacillin-tazobactam (ZOSYN)  IV  4.5 g Intravenous Q8H  . [START ON 03/01/2016] potassium chloride SA  20 mEq Oral Daily  . [START ON 03/01/2016] simvastatin  20 mg Oral q1800    Results for orders placed or performed during the hospital encounter of 02/29/16 (from the past 48 hour(s))  Glucose, capillary  Status: Abnormal   Collection Time: 02/29/16  4:18 PM  Result Value Ref Range   Glucose-Capillary 129 (H) 65 - 99 mg/dL   Comment 1 Notify RN     No results found.  Review of Systems  Constitutional: Negative for fever, chills and malaise/fatigue.  HENT: Negative.   Eyes: Negative.   Respiratory: Negative.   Cardiovascular: Negative.   Gastrointestinal: Negative.   Genitourinary: Negative.   Musculoskeletal: Negative.   Skin:       Redness and swelling in the right great toe and foot with a draining ulcer which has been chronic beneath the great toe.  Neurological:       Patient does have some  significant neuropathy related to his diabetes in the lower extremities.  Endo/Heme/Allergies: Negative.   Psychiatric/Behavioral: Negative.    Blood pressure 152/80, pulse 67, temperature 98.2 F (36.8 C), temperature source Oral, resp. rate 18, SpO2 100 %. Physical Exam  Cardiovascular:  DP and PT pulses are 1 over 4 and palpable bilateral. Capillary filling time intact  Musculoskeletal:  Adequate range of motion pedal joints. Muscle testing within normal limits  Neurological:  Complete loss of protective threshold with a monofilament wire distally in the forefoot and toes bilateral. Proprioception is completely impaired  Skin:  The skin is warm dry and supple. Some diminished digital hair growth. Significant erythema and edema in the right hallux extending up onto the forefoot region. A full-thickness ulceration with abscess and necrotic tissue probing down to the level of bone is noted on the plantar aspect of the right hallux. Some lateral gangrenous necrotic tissue is also noted along the lateral aspect of the hallux with some mild area along the dorsal aspect just proximal to the nail plate.    Assessment/Plan: Assessment: 1. Full-thickness ulceration with cellulitis and abscess right hallux. 2. Gangrenous changes right great toe. 3. Osteomyelitis right hallux. 4. Diabetes with associated neuropathy. Plan: Discussed with the patient due to the extensive damage to the great toe that he is in an urgent need for amputation of the great toe. Discussed possible risks and complications including failure to heal due to his circulation or diabetes as well as continued infection and the need for further surgery. We will obtain a consent form for amputation of the great toe. Questions were invited and answered. The patient states he has not eaten since breakfast this morning and is currently nothing by mouth so we will plan for surgery later this evening  Marc [name] Marc [name] 02/29/2016, 4:50 PM      

## 2016-02-29 NOTE — H&P (Signed)
[facility] - [place] at [facility]   PATIENT NAME: Marc [name]    MR#:  [id number]  DATE OF BIRTH:  1961-04-07  DATE OF ADMISSION:  02/29/2016  PRIMARY CARE PHYSICIAN: No PCP Per Patient   REQUESTING/REFERRING PHYSICIAN: Dr. Alberteen Spindle  CHIEF COMPLAINT:  No chief complaint on file.   HISTORY OF PRESENT ILLNESS:  Marc [name]  is a 55 y.o. male with a known history of DM, HTN, CAD presents to the hospital as a direct admission from podiatry clinic sent in by Dr. Alberteen Spindle. Patient has had chronic wound of his right foot which started out as a callus. Over the last 10 days this has worsened. He was seen in the emergency room and started on Keflex and Bactrim with no improvement. X-ray checked one week back showed no osteomyelitis but an x-ray done today as outpatient showed changes of osteomyelitis. Patient does have neuropathy and has no significant pain. Has had chills. Patient believes he might have had fever at home but temperature was not checked. Blood sugars have been elevated close to 200.  PAST MEDICAL HISTORY:   Past Medical History  Diagnosis Date  . Diabetes mellitus without complication (HCC)   . Tobacco abuse   . Obesity   . Cardiomyopathy (HCC)     45-50%  . CHF (congestive heart failure) (HCC)   . Anxiety   . Medical non-compliance   . Chronic systolic heart failure (HCC) 07/2013    Ejection fraction of 45-50% with possible inferior wall hypokinesis  . Hypertension   . Hyperlipidemia   . MI (myocardial infarction) (HCC)   . Dysrhythmia   . Coronary artery disease     Significant left main and Severe three-vessel coronary artery disease in December of 2014. He underwent CABG in January of 2015 with LIMA to LAD, sequential SVG to ramus/OM 2 and SVG to OM 3    PAST SURGICAL HISTORY:   Past Surgical History  Procedure Laterality Date  . Cardiac catheterization      [facility]. no stent  . Knee surgery Right   . Lasik Bilateral   .  Coronary artery bypass graft N/A 09/16/2013    Procedure: CORONARY ARTERY BYPASS GRAFTING (CABG);  Surgeon: [name], MD;  Location: [facility] OR;  Service: Open Heart Surgery;  Laterality: N/A;  CABG x four, using left internal mammary artery and right leg greater saphenous vein  . Intraoperative transesophageal echocardiogram N/A 09/16/2013    Procedure: INTRAOPERATIVE TRANSESOPHAGEAL ECHOCARDIOGRAM;  Surgeon: [name], MD;  Location: [facility] OR;  Service: Open Heart Surgery;  Laterality: N/A;    SOCIAL HISTORY:   Social History  Substance Use Topics  . Smoking status: Current Every Day Smoker -- 1.00 packs/day for 45 years    Types: Cigarettes  . Smokeless tobacco: Never Used  . Alcohol Use: No     Comment: occassional    FAMILY HISTORY:   Family History  Problem Relation Age of Onset  . Heart disease Mother     DRUG ALLERGIES:  No Known Allergies  REVIEW OF SYSTEMS:   Review of Systems  Constitutional: Positive for fever, chills and malaise/fatigue. Negative for weight loss.  HENT: Negative for hearing loss and nosebleeds.   Eyes: Negative for blurred vision, double vision and pain.  Respiratory: Negative for cough, hemoptysis, sputum production, shortness of breath and wheezing.   Cardiovascular: Negative for chest pain, palpitations, orthopnea and leg swelling.  Gastrointestinal: Negative for nausea, vomiting, abdominal pain, diarrhea and  constipation.  Genitourinary: Negative for dysuria and hematuria.  Musculoskeletal: Negative for myalgias, back pain and falls.  Skin: Negative for rash.  Neurological: Positive for tingling and weakness. Negative for dizziness, tremors, sensory change, speech change, focal weakness, seizures and headaches.  Endo/Heme/Allergies: Does not bruise/bleed easily.  Psychiatric/Behavioral: Negative for depression and memory loss. The patient is not nervous/anxious.     MEDICATIONS AT HOME:   Prior to Admission medications   Medication Sig  Start Date End Date Taking? Authorizing Provider  ALPRAZolam Prudy Feeler) 0.25 MG tablet Take 0.25 mg by mouth at bedtime as needed for anxiety.    Historical Provider, MD  amiodarone (PACERONE) 400 MG tablet Take 0.5 tablets (200 mg total) by mouth 2 (two) times daily. 09/24/13   Wayne E Gold, PA-C  carvedilol (COREG) 25 MG tablet Take 1 tablet (25 mg total) by mouth 2 (two) times daily. 06/26/14   Antonieta Iba, MD  cephALEXin (KEFLEX) 500 MG capsule Take 1 capsule (500 mg total) by mouth 4 (four) times daily. 02/22/16   Tommi Rumps, PA-C  furosemide (LASIX) 40 MG tablet Take 1 tablet (40 mg total) by mouth daily. 05/05/14   Iran Ouch, MD  glimepiride (AMARYL) 4 MG tablet Take 1 tablet (4 mg total) by mouth daily with breakfast. 09/24/13   Rowe Clack, PA-C  HYDROcodone-acetaminophen (NORCO/VICODIN) 5-325 MG tablet Take 1 tablet by mouth every 4 (four) hours as needed for moderate pain. 02/22/16   Tommi Rumps, PA-C  lisinopril (PRINIVIL,ZESTRIL) 20 MG tablet Take 1 tablet (20 mg total) by mouth daily. 09/24/13   Wayne E Gold, PA-C  metFORMIN (GLUCOPHAGE) 1000 MG tablet Take 1 tablet (1,000 mg total) by mouth 2 (two) times daily with a meal. 07/22/13   Iran Ouch, MD  Multiple Vitamin (MULTIVITAMIN WITH MINERALS) TABS tablet Take 1 tablet by mouth daily.    Historical Provider, MD  potassium chloride SA (K-DUR,KLOR-CON) 20 MEQ tablet Take 1 tablet (20 mEq total) by mouth daily. 10/02/13   Iran Ouch, MD  simvastatin (ZOCOR) 20 MG tablet Take 1 tablet (20 mg total) by mouth daily at 6 PM. 09/24/13   Wayne E Gold, PA-C  sulfamethoxazole-trimethoprim (BACTRIM DS,SEPTRA DS) 800-160 MG tablet Take 1 tablet by mouth 2 (two) times daily. 02/22/16   Tommi Rumps, PA-C     VITAL SIGNS:  Blood pressure 152/80, pulse 67, temperature 98.2 F (36.8 C), temperature source Oral, resp. rate 18, SpO2 100 %.  PHYSICAL EXAMINATION:  Physical Exam  GENERAL:  55 y.o.-year-old patient lying  in the bed with no acute distress.  EYES: Pupils equal, round, reactive to light and accommodation. No scleral icterus. Extraocular muscles intact.  HEENT: Head atraumatic, normocephalic. Oropharynx and nasopharynx clear. No oropharyngeal erythema, moist oral mucosa  NECK:  Supple, no jugular venous distention. No thyroid enlargement, no tenderness.  LUNGS: Normal breath sounds bilaterally, no wheezing, rales, rhonchi. No use of accessory muscles of respiration.  CARDIOVASCULAR: S1, S2 normal. No murmurs, rubs, or gallops.  ABDOMEN: Soft, nontender, nondistended. Bowel sounds present. No organomegaly or mass.  EXTREMITIES: No pedal edema, cyanosis, or clubbing. + 2 pedal & radial pulses b/l.   NEUROLOGIC: Cranial nerves II through XII are intact. No focal Motor or sensory deficits appreciated b/l PSYCHIATRIC: The patient is alert and oriented x 3. Good affect.  SKIN: Right great toe under dressing. Mild erythema and warmth around it.  LABORATORY PANEL:   CBC No results for input(s): WBC, HGB, HCT,  PLT in the last 168 hours. ------------------------------------------------------------------------------------------------------------------  Chemistries  No results for input(s): NA, K, CL, CO2, GLUCOSE, BUN, CREATININE, CALCIUM, MG, AST, ALT, ALKPHOS, BILITOT in the last 168 hours.  Invalid input(s): GFRCGP ------------------------------------------------------------------------------------------------------------------  Cardiac Enzymes No results for input(s): TROPONINI in the last 168 hours. ------------------------------------------------------------------------------------------------------------------  RADIOLOGY:  No results found.   IMPRESSION AND PLAN:   * Right great toe osteomyelitis Start IV vancomycin and Zosyn. Consult podiatry. Discussed with Dr. Alberteen Spindle. Surgery later today. We'll keep patient nothing by mouth. Check CBC and CMP. Blood cultures ordered.  *  Insulin-dependent diabetes mellitus Continue Lantus. Sliding scale insulin. Check HbA1c.  * Hypertension Continue home medication  * DVT prophylaxis with Lovenox  All the records are reviewed and case discussed with ED provider. Management plans discussed with the patient, family and they are in agreement.  CODE STATUS: FULL CODE  TOTAL TIME TAKING CARE OF THIS PATIENT: 40 minutes.   Milagros Loll R M.D on 02/29/2016 at 4:47 PM  Between 7am to 6pm - Pager - [phone]  After 6pm go to [email or web address] - password [email or web address] [facility]  [facility]  Office  [phone]  CC: Primary care physician; No PCP Per Patient  Note: This dictation was prepared with Dragon dictation along with smaller phrase technology. Any transcriptional errors that result from this process are unintentional.

## 2016-02-29 NOTE — Anesthesia Preprocedure Evaluation (Signed)
Anesthesia Evaluation  Patient identified by MRN, date of birth, ID band Patient awake    Reviewed: Allergy & Precautions, H&P , NPO status , Patient's Chart, lab work & pertinent test results  History of Anesthesia Complications Negative for: history of anesthetic complications  Airway Mallampati: III  TM Distance: <3 FB Neck ROM: limited    Dental  (+) Poor Dentition, Chipped   Pulmonary neg shortness of breath, Current Smoker,    Pulmonary exam normal breath sounds clear to auscultation       Cardiovascular Exercise Tolerance: Good hypertension, (-) angina+ CAD, + Past MI and +CHF  (-) DOE Normal cardiovascular exam+ dysrhythmias Atrial Fibrillation  Rhythm:regular Rate:Normal     Neuro/Psych PSYCHIATRIC DISORDERS Anxiety negative neurological ROS     GI/Hepatic negative GI ROS, Neg liver ROS,   Endo/Other  diabetes, Poorly Controlled, Type 2, Insulin Dependent  Renal/GU negative Renal ROS  negative genitourinary   Musculoskeletal   Abdominal   Peds  Hematology negative hematology ROS (+)   Anesthesia Other Findings Past Medical History:   Diabetes mellitus without complication (HCC)                 Tobacco abuse                                                Obesity                                                      Cardiomyopathy (HCC)                                           Comment:45-50%   CHF (congestive heart failure) (HCC)                         Anxiety                                                      Medical non-compliance                                       Chronic systolic heart failure (HCC)            07/2013        Comment:Ejection fraction of 45-50% with possible               inferior wall hypokinesis   Hypertension                                                 Hyperlipidemia  MI (myocardial infarction) [facility])                              Dysrhythmia                                                  Coronary artery disease                                        Comment:Significant left main and Severe three-vessel               coronary artery disease in December of 2014. He              underwent CABG in January of 2015 with LIMA to               LAD, sequential SVG to ramus/OM 2 and SVG to OM              3  Past Surgical History:   CARDIAC CATHETERIZATION                                         Comment:[facility]. no stent   knee surgery                                    Right              LASIK                                           Bilateral              CORONARY ARTERY BYPASS GRAFT                    N/A 09/16/2013      Comment:Procedure: CORONARY ARTERY BYPASS GRAFTING               (CABG);  Surgeon: [name], MD;                Location: [facility] OR;  Service: Open Heart Surgery;               Laterality: N/A;  CABG x four, using left               internal mammary artery and right leg greater               saphenous vein   INTRAOPERATIVE TRANSESOPHAGEAL ECHOCARDIOGRAM   N/A 09/16/2013      Comment:Procedure: INTRAOPERATIVE TRANSESOPHAGEAL               ECHOCARDIOGRAM;  Surgeon: [name], MD;                Location: [facility] OR;  Service: Open Heart Surgery;               Laterality: N/A;     Reproductive/Obstetrics negative OB ROS  Anesthesia Physical Anesthesia Plan  ASA: III  Anesthesia Plan: General   Post-op Pain Management:    Induction:   Airway Management Planned:   Additional Equipment:   Intra-op Plan:   Post-operative Plan:   Informed Consent: I have reviewed the patients History and Physical, chart, labs and discussed the procedure including the risks, benefits and alternatives for the proposed anesthesia with the patient or authorized representative who has indicated his/her understanding and acceptance.   Dental Advisory  Given  Plan Discussed with: Anesthesiologist, CRNA and Surgeon  Anesthesia Plan Comments:         Anesthesia Quick Evaluation

## 2016-02-29 NOTE — Interval H&P Note (Signed)
History and Physical Interval Note:  02/29/2016 5:04 PM  Marc [name]  has presented today for surgery, with the diagnosis of n/a  The various methods of treatment have been discussed with the patient and family. After consideration of risks, benefits and other options for treatment, the patient has consented to  Procedure(s): AMPUTATION TOE (Right) as a surgical intervention .  The patient's history has been reviewed, patient examined, no change in status, stable for surgery.  I have reviewed the patient's chart and labs.  Questions were answered to the patient's satisfaction.     [name]

## 2016-02-29 NOTE — Anesthesia Postprocedure Evaluation (Signed)
Anesthesia Post Note  Patient: Marc [name]  Procedure(s) Performed: Procedure(s) (LRB): AMPUTATION TOE (Right)  Patient location during evaluation: PACU Anesthesia Type: General Level of consciousness: awake and alert Pain management: pain level controlled Vital Signs Assessment: post-procedure vital signs reviewed and stable Respiratory status: spontaneous breathing, nonlabored ventilation, respiratory function stable and patient connected to nasal cannula oxygen Cardiovascular status: blood pressure returned to baseline and stable Postop Assessment: no signs of nausea or vomiting Anesthetic complications: no    Last Vitals:  Filed Vitals:   02/29/16 2119 02/29/16 2130  BP: 111/67 130/65  Pulse: 60 60  Temp: 36.7 C 36.7 C  Resp: 21 18    Last Pain:  Filed Vitals:   02/29/16 2131  PainSc: 2                  Cleda Mccreedy Piscitello

## 2016-02-29 NOTE — Consult Note (Addendum)
Reason for Consult: Abscess with cellulitis and osteomyelitis right hallux Referring Physician: [facility]  Marc [name] is an 55 y.o. male.  HPI: Marc [name] relates a history of ulceration beneath his right great toe over the last 6 months. Previously was seen in the emergency department about 3 months ago. Continued to have chronic problems with the ulceration. Recently about a week ago developed fever and chills and did get back to the emergency department. He was placed on oral antibiotics as there was no clear evidence of any bone infection. After a couple of days he started to feel a little bit better. He was seen outpatient in our office today with obvious gangrenous changes with early osteomyelitis on x-ray and was referred for admission and amputation of his right great toe  Past Medical History  Diagnosis Date  . Diabetes mellitus without complication (HCC)   . Tobacco abuse   . Obesity   . Cardiomyopathy (HCC)     45-50%  . CHF (congestive heart failure) (HCC)   . Anxiety   . Medical non-compliance   . Chronic systolic heart failure (HCC) 07/2013    Ejection fraction of 45-50% with possible inferior wall hypokinesis  . Hypertension   . Hyperlipidemia   . MI (myocardial infarction) (HCC)   . Dysrhythmia   . Coronary artery disease     Significant left main and Severe three-vessel coronary artery disease in December of 2014. He underwent CABG in January of 2015 with LIMA to LAD, sequential SVG to ramus/OM 2 and SVG to OM 3    Past Surgical History  Procedure Laterality Date  . Cardiac catheterization      [facility]. no stent  . Knee surgery Right   . Lasik Bilateral   . Coronary artery bypass graft N/A 09/16/2013    Procedure: CORONARY ARTERY BYPASS GRAFTING (CABG);  Surgeon: [name], MD;  Location: [facility] OR;  Service: Open Heart Surgery;  Laterality: N/A;  CABG x four, using left internal mammary artery and right leg greater saphenous vein  .  Intraoperative transesophageal echocardiogram N/A 09/16/2013    Procedure: INTRAOPERATIVE TRANSESOPHAGEAL ECHOCARDIOGRAM;  Surgeon: [name], MD;  Location: [facility] OR;  Service: Open Heart Surgery;  Laterality: N/A;    Family History  Problem Relation Age of Onset  . Heart disease Mother     Social History:  reports that he has been smoking Cigarettes.  He has a 45 pack-year smoking history. He has never used smokeless tobacco. He reports that he does not drink alcohol or use illicit drugs.  Allergies: No Known Allergies  Medications:  Scheduled: . amiodarone  200 mg Oral BID  . carvedilol  25 mg Oral BID  . enoxaparin (LOVENOX) injection  40 mg Subcutaneous Q24H  . [START ON 03/01/2016] furosemide  40 mg Oral Daily  . [START ON 03/01/2016] glimepiride  4 mg Oral Q breakfast  . insulin aspart  0-15 Units Subcutaneous TID WC  . insulin glargine  26 Units Subcutaneous QHS  . [START ON 03/01/2016] lisinopril  20 mg Oral Daily  . LORazepam  0.5 mg Intravenous Once  . piperacillin-tazobactam (ZOSYN)  IV  4.5 g Intravenous Q8H  . [START ON 03/01/2016] potassium chloride SA  20 mEq Oral Daily  . [START ON 03/01/2016] simvastatin  20 mg Oral q1800    Results for orders placed or performed during the hospital encounter of 02/29/16 (from the past 48 hour(s))  Glucose, capillary  Status: Abnormal   Collection Time: 02/29/16  4:18 PM  Result Value Ref Range   Glucose-Capillary 129 (H) 65 - 99 mg/dL   Comment 1 Notify RN     No results found.  Review of Systems  Constitutional: Negative for fever, chills and malaise/fatigue.  HENT: Negative.   Eyes: Negative.   Respiratory: Negative.   Cardiovascular: Negative.   Gastrointestinal: Negative.   Genitourinary: Negative.   Musculoskeletal: Negative.   Skin:       Redness and swelling in the right great toe and foot with a draining ulcer which has been chronic beneath the great toe.  Neurological:       Patient does have some  significant neuropathy related to his diabetes in the lower extremities.  Endo/Heme/Allergies: Negative.   Psychiatric/Behavioral: Negative.    Blood pressure 152/80, pulse 67, temperature 98.2 F (36.8 C), temperature source Oral, resp. rate 18, SpO2 100 %. Physical Exam  Cardiovascular:  DP and PT pulses are 1 over 4 and palpable bilateral. Capillary filling time intact  Musculoskeletal:  Adequate range of motion pedal joints. Muscle testing within normal limits  Neurological:  Complete loss of protective threshold with a monofilament wire distally in the forefoot and toes bilateral. Proprioception is completely impaired  Skin:  The skin is warm dry and supple. Some diminished digital hair growth. Significant erythema and edema in the right hallux extending up onto the forefoot region. A full-thickness ulceration with abscess and necrotic tissue probing down to the level of bone is noted on the plantar aspect of the right hallux. Some lateral gangrenous necrotic tissue is also noted along the lateral aspect of the hallux with some mild area along the dorsal aspect just proximal to the nail plate.    Assessment/Plan: Assessment: 1. Full-thickness ulceration with cellulitis and abscess right hallux. 2. Gangrenous changes right great toe. 3. Osteomyelitis right hallux. 4. Diabetes with associated neuropathy. Plan: Discussed with the patient due to the extensive damage to the great toe that he is in an urgent need for amputation of the great toe. Discussed possible risks and complications including failure to heal due to his circulation or diabetes as well as continued infection and the need for further surgery. We will obtain a consent form for amputation of the great toe. Questions were invited and answered. The patient states he has not eaten since breakfast this morning and is currently nothing by mouth so we will plan for surgery later this evening  Marc [name] 02/29/2016, 4:50 PM

## 2016-02-29 NOTE — Progress Notes (Addendum)
ANTIBIOTIC CONSULT NOTE - INITIAL  Pharmacy Consult for Vancomycin/Zosyn  Indication: osteomyelitis  No Known Allergies  Patient Measurements:   Adjusted Body Weight: 88.8 kg   Vital Signs: Temp: 98.2 F (36.8 C) (06/27 1607) Temp Source: Oral (06/27 1607) BP: 152/80 mmHg (06/27 1607) Pulse Rate: 67 (06/27 1607) Intake/Output from previous day:   Intake/Output from this shift:    Labs: No results for input(s): WBC, HGB, PLT, LABCREA, CREATININE in the last 72 hours. Estimated Creatinine Clearance: 99.8 mL/min (by C-G formula based on Cr of 1.07). No results for input(s): VANCOTROUGH, VANCOPEAK, VANCORANDOM, GENTTROUGH, GENTPEAK, GENTRANDOM, TOBRATROUGH, TOBRAPEAK, TOBRARND, AMIKACINPEAK, AMIKACINTROU, AMIKACIN in the last 72 hours.   Microbiology: No results found for this or any previous visit (from the past 720 hour(s)).  Medical History: Past Medical History  Diagnosis Date  . Diabetes mellitus without complication (HCC)   . Tobacco abuse   . Obesity   . Cardiomyopathy (HCC)     45-50%  . CHF (congestive heart failure) (HCC)   . Anxiety   . Medical non-compliance   . Chronic systolic heart failure (HCC) 07/2013    Ejection fraction of 45-50% with possible inferior wall hypokinesis  . Hypertension   . Hyperlipidemia   . MI (myocardial infarction) (HCC)   . Dysrhythmia   . Coronary artery disease     Significant left main and Severe three-vessel coronary artery disease in December of 2014. He underwent CABG in January of 2015 with LIMA to LAD, sequential SVG to ramus/OM 2 and SVG to OM 3    Medications:  Prescriptions prior to admission  Medication Sig Dispense Refill Last Dose  . ALPRAZolam (XANAX) 0.25 MG tablet Take 0.25 mg by mouth at bedtime as needed for anxiety.   Taking  . amiodarone (PACERONE) 400 MG tablet Take 0.5 tablets (200 mg total) by mouth 2 (two) times daily. 60 tablet 1 Taking  . carvedilol (COREG) 25 MG tablet Take 1 tablet (25 mg total) by  mouth 2 (two) times daily. 60 tablet 6 More than a month at Unknown time  . cephALEXin (KEFLEX) 500 MG capsule Take 1 capsule (500 mg total) by mouth 4 (four) times daily. 40 capsule 0   . furosemide (LASIX) 40 MG tablet Take 1 tablet (40 mg total) by mouth daily. 30 tablet 1   . glimepiride (AMARYL) 4 MG tablet Take 1 tablet (4 mg total) by mouth daily with breakfast. 30 tablet 1 Taking  . HYDROcodone-acetaminophen (NORCO/VICODIN) 5-325 MG tablet Take 1 tablet by mouth every 4 (four) hours as needed for moderate pain. 20 tablet 0   . lisinopril (PRINIVIL,ZESTRIL) 20 MG tablet Take 1 tablet (20 mg total) by mouth daily. 30 tablet 1 Taking  . metFORMIN (GLUCOPHAGE) 1000 MG tablet Take 1 tablet (1,000 mg total) by mouth 2 (two) times daily with a meal. 60 tablet 6 Taking  . Multiple Vitamin (MULTIVITAMIN WITH MINERALS) TABS tablet Take 1 tablet by mouth daily.   Taking  . potassium chloride SA (K-DUR,KLOR-CON) 20 MEQ tablet Take 1 tablet (20 mEq total) by mouth daily. 30 tablet 3 Taking  . simvastatin (ZOCOR) 20 MG tablet Take 1 tablet (20 mg total) by mouth daily at 6 PM. 30 tablet 1 Taking  . sulfamethoxazole-trimethoprim (BACTRIM DS,SEPTRA DS) 800-160 MG tablet Take 1 tablet by mouth 2 (two) times daily. 20 tablet 0    Assessment: CrCl = 72.5 ml/min Ke = 0.065 hr-1 T1/2 = 10.66 hrs Vd = 64.05 L   Pseudomonas risk  factors:  Pt failed Septra as outpt.   Goal of Therapy:  Vancomycin trough level 15-20 mcg/ml  Plan:  Expected duration 7 days with resolution of temperature and/or normalization of WBC   Zosyn 4.5 gm IV Q8H EI ordered to start 6/27 @ 17:30.   Vancomycin 1250 mg IV X 1 ordered to be given 6/27 @ 18:00.  Vancomycin 1250 mg IV Q12H ordered to start 6/28 @ 00:00, 6 hrs after 1st dose (stacked dosing).  This pt will reach [facility] by 6/29 @ 18:00.  Will draw 1st trough on 6/29 @ 23:30, which will be at [facility].   Irlanda Croghan D 02/29/2016,5:08 PM

## 2016-02-29 NOTE — Op Note (Signed)
Date of operation: 02/29/2016.  Surgeon: [name] DPM.  Preoperative diagnosis: Gangrene with osteomyelitis right hallux.  Postoperative diagnosis: Same.  Procedure: Amputation right great toe.  Anesthesia: Local Mac.  Hemostasis: Pneumatic tourniquet right ankle 250 mmHg.  Estimated blood loss: Less than 5 cc.  Cultures: Bone cultures distal phalanx right hallux.  Pathology: Right great toe.  Complications: None apparent.  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 Recently over the last couple of weeks significant worsening with failure with outpatient antibiotics and development of gangrenous changes in the great toe in decision was made for admission and amputation of the great toe.  Operative procedure: Patient was taken to the operating room and placed on the table in the supine position. Following satisfactory sedation the right foot was anesthetized with 10 cc of 0.5% bupivacaine plain. A pneumatic tourniquet was applied at the level of the right ankle and the foot was prepped and draped in the usual sterile fashion. The foot was exsanguinated and the tourniquet inflated to 250 mmHg. Attention was then directed to the distal aspect of the right foot where a fishmouth type incision was made coursing lateral to medial around the base of the first toe. The incision was carried sharply down to the level of the bone and periosteal dissection carried back to the proximal aspect of the proximal phalanx. There was noted be healthy appearing tissues. At the base of the proximal phalanx the bone was transected using a power saw and the toe was then removed in toto with care taken to leave the joint and its attachments intact. The wound was then flushed with copious amounts of sterile saline and closed using 4-0 nylon vertical mattress and simple interrupted sutures. Xeroform and a sterile bandage was applied. The  tourniquet was released and blood flow noted to return to the right foot. Patient tolerated the procedure and anesthesia well and was transported to the PACU with vital signs stable and in good condition.

## 2016-02-29 NOTE — Progress Notes (Signed)
Pt admission paper work completed. Orientation to room given. IV number 20 to right AC inserted times one attempt.Consent for surgery signed by pt. MRSA swab sent to lab

## 2016-02-29 NOTE — Transfer of Care (Signed)
Immediate Anesthesia Transfer of Care Note  Patient: Marc [name]  Procedure(s) Performed: Procedure(s) with comments: AMPUTATION TOE (Right) - Great toe  Patient Location: PACU  Anesthesia Type:General  Level of Consciousness: awake, alert , oriented and patient cooperative  Airway & Oxygen Therapy: Patient Spontanous Breathing  Post-op Assessment: Report given to RN and Post -op Vital signs reviewed and stable  Post vital signs: Reviewed and stable  Last Vitals:  Filed Vitals:   02/29/16 1607  BP: 152/80  Pulse: 67  Temp: 36.8 C  Resp: 18    Last Pain:  Filed Vitals:   02/29/16 1648  PainSc: 2          Complications: No apparent anesthesia complications

## 2016-03-01 ENCOUNTER — Encounter: Payer: Self-pay | Admitting: Podiatry

## 2016-03-01 LAB — BASIC METABOLIC PANEL
Anion gap: 7 (ref 5–15)
BUN: 27 mg/dL — AB (ref 6–20)
CO2: 19 mmol/L — ABNORMAL LOW (ref 22–32)
Calcium: 9.5 mg/dL (ref 8.9–10.3)
Chloride: 104 mmol/L (ref 101–111)
Creatinine, Ser: 1.3 mg/dL — ABNORMAL HIGH (ref 0.61–1.24)
GFR, EST NON AFRICAN AMERICAN: 60 mL/min — AB (ref >60)
Glucose, Bld: 175 mg/dL — ABNORMAL HIGH (ref 65–99)
POTASSIUM: 4.9 mmol/L (ref 3.5–5.1)
SODIUM: 130 mmol/L — AB (ref 135–145)

## 2016-03-01 LAB — CBC
HEMATOCRIT: 33 % — AB (ref 40.0–52.0)
Hemoglobin: 11.8 g/dL — ABNORMAL LOW (ref 13.0–18.0)
MCH: 33.1 pg (ref 26.0–34.0)
MCHC: 35.6 g/dL (ref 32.0–36.0)
MCV: 92.9 fL (ref 80.0–100.0)
PLATELETS: 388 10*3/uL (ref 150–440)
RBC: 3.55 MIL/uL — ABNORMAL LOW (ref 4.40–5.90)
RDW: 13.4 % (ref 11.5–14.5)
WBC: 13.3 10*3/uL — AB (ref 3.8–10.6)

## 2016-03-01 LAB — GLUCOSE, CAPILLARY
GLUCOSE-CAPILLARY: 284 mg/dL — AB (ref 65–99)
GLUCOSE-CAPILLARY: 220 mg/dL — AB (ref 65–99)
Glucose-Capillary: 210 mg/dL — ABNORMAL HIGH (ref 65–99)
Glucose-Capillary: 267 mg/dL — ABNORMAL HIGH (ref 65–99)
Glucose-Capillary: 239 mg/dL — ABNORMAL HIGH (ref 65–99)
Glucose-Capillary: 216 mg/dL — ABNORMAL HIGH (ref 65–99)

## 2016-03-01 LAB — HEMOGLOBIN A1C: HEMOGLOBIN A1C: 10 % — AB (ref 4.0–6.0)

## 2016-03-01 MED ORDER — INSULIN GLARGINE 100 UNIT/ML ~~LOC~~ SOLN
32.0000 [IU] | Freq: Every day | SUBCUTANEOUS | Status: DC
  Administered 2016-03-01: 32 [IU] via SUBCUTANEOUS
  Filled 2016-03-01 (×2): qty 0.32

## 2016-03-01 MED ORDER — ALUM & MAG HYDROXIDE-SIMETH 200-200-20 MG/5ML PO SUSP
15.0000 mL | ORAL | Status: DC | PRN
  Administered 2016-03-01: 15 mL via ORAL
  Filled 2016-03-01: qty 30

## 2016-03-01 MED ORDER — INSULIN ASPART 100 UNIT/ML ~~LOC~~ SOLN
4.0000 [IU] | Freq: Three times a day (TID) | SUBCUTANEOUS | Status: DC
  Administered 2016-03-02: 4 [IU] via SUBCUTANEOUS
  Filled 2016-03-01: qty 4

## 2016-03-01 NOTE — Progress Notes (Signed)
1 Day Post-Op  Subjective: Patient seen. States he's really not having any significant pain with his right foot. Overall doing well.  Objective: Vital signs in last 24 hours: Temp:  [97.7 F (36.5 C)-98.5 F (36.9 C)] 98 F (36.7 C) (06/28 0725) Pulse Rate:  [57-70] 66 (06/28 0725) Resp:  [14-21] 18 (06/28 0725) BP: (105-152)/(56-80) 109/62 mmHg (06/28 0725) SpO2:  [94 %-100 %] 98 % (06/28 0725) Weight:  [111.63 kg (246 lb 1.6 oz)] 111.63 kg (246 lb 1.6 oz) (06/28 0500) Last BM Date: 03/01/16  Intake/Output from previous day: 06/27 0701 - 06/28 0700 In: 125 [I.V.:125] Out: 2075 [Urine:2075] Intake/Output this shift: Total I/O In: 240 [P.O.:240] Out: 200 [Urine:200]  The bandages dry and intact. No evidence of any strikethrough on the bandaging.  Lab Results:   Recent Labs  02/29/16 1727 03/01/16 0325  WBC 15.2* 13.3*  HGB 12.9* 11.8*  HCT 37.2* 33.0*  PLT 445* 388   BMET  Recent Labs  02/29/16 1727 03/01/16 0325  NA 131* 130*  K 5.1 4.9  CL 100* 104  CO2 20* 19*  GLUCOSE 134* 175*  BUN 31* 27*  CREATININE 1.47* 1.30*  CALCIUM 10.9* 9.5   PT/INR No results for input(s): LABPROT, INR in the last 72 hours. ABG No results for input(s): PHART, HCO3 in the last 72 hours.  Invalid input(s): PCO2, PO2  Studies/Results: No results found.  Anti-infectives: Anti-infectives    Start     Dose/Rate Route Frequency Ordered Stop   03/01/16 0000  vancomycin (VANCOCIN) 1,250 mg in sodium chloride 0.9 % 250 mL IVPB     1,250 mg 166.7 mL/hr over 90 Minutes Intravenous Every 12 hours 02/29/16 1707     02/29/16 1800  vancomycin (VANCOCIN) 1,250 mg in sodium chloride 0.9 % 250 mL IVPB     1,250 mg 166.7 mL/hr over 90 Minutes Intravenous  Once 02/29/16 1706 02/29/16 1917   02/29/16 1715  vancomycin (VANCOCIN) 1,500 mg in sodium chloride 0.9 % 500 mL IVPB  Status:  Discontinued     1,500 mg 250 mL/hr over 120 Minutes Intravenous  Once 02/29/16 1704 02/29/16 1707   02/29/16 1700  piperacillin-tazobactam (ZOSYN) IVPB 4.5 g     4.5 g 25 mL/hr over 240 Minutes Intravenous Every 8 hours 02/29/16 1651        Assessment/Plan: s/p Procedure(s) with comments: AMPUTATION TOE (Right) - Great toe Assessment: Stable status post amputation right great toe.   Plan: Dressing was left intact today. We will reassess the wound tomorrow. Repeat his CBC. Hopefully stable for discharge tomorrow or Friday  LOS: 1 day    [name] 03/01/2016

## 2016-03-01 NOTE — Care Management Note (Signed)
Case Management Note  Patient Details  Name: Marc [name] MRN: [id number] Date of Birth: 11-08-1960  Subjective/Objective:                   Met with patient to discuss discharge planning. He is from home alone; independent with mobility. Per Dr. Cleda Mccreedy patient is not to put weight on toes and will not need DME (crutches or walker) at time of discharge. PCP is with [facility].  He denies difficulty paying for Rx. Action/Plan: Glucometer Rx requested.   Expected Discharge Date:  03/02/16               Expected Discharge Plan:     In-House Referral:     Discharge planning Services  CM Consult  Post Acute Care Choice:    Choice offered to:  Patient  DME Arranged:    DME Agency:     HH Arranged:    [place] Agency:     Status of Service:  In process, will continue to follow  If discussed at Long Length of Stay Meetings, dates discussed:    Additional Comments:  Marshell Garfinkel, RN 03/01/2016, 12:49 PM

## 2016-03-01 NOTE — Progress Notes (Signed)
[employer] - [place] [facility]   PATIENT NAME: Marc [name]    MR#:  [id number]  DATE OF BIRTH:  [birth year]  SUBJECTIVE:  CHIEF COMPLAINT:  No chief complaint on file.   Sent from Dr. Gus Height office- for OM as found on xray.  s/p amputation right great toe- 02/29/16  not much pain, feels better today.  REVIEW OF SYSTEMS:  CONSTITUTIONAL: No fever, fatigue or weakness.  EYES: No blurred or double vision.  EARS, NOSE, AND THROAT: No tinnitus or ear pain.  RESPIRATORY: No cough, shortness of breath, wheezing or hemoptysis.  CARDIOVASCULAR: No chest pain, orthopnea, edema.  GASTROINTESTINAL: No nausea, vomiting, diarrhea or abdominal pain.  GENITOURINARY: No dysuria, hematuria.  ENDOCRINE: No polyuria, nocturia,  HEMATOLOGY: No anemia, easy bruising or bleeding SKIN: No rash or lesion. MUSCULOSKELETAL: No joint pain or arthritis.   NEUROLOGIC: No tingling, numbness, weakness.  PSYCHIATRY: No anxiety or depression.   ROS  DRUG ALLERGIES:  No Known Allergies  VITALS:  Blood pressure 128/69, pulse 57, temperature 98.4 F (36.9 C), temperature source Oral, resp. rate 18, weight 111.63 kg (246 lb 1.6 oz), SpO2 99 %.  PHYSICAL EXAMINATION:  GENERAL:  55 y.o.-year-old patient lying in the bed with no acute distress.  EYES: Pupils equal, round, reactive to light and accommodation. No scleral icterus. Extraocular muscles intact.  HEENT: Head atraumatic, normocephalic. Oropharynx and nasopharynx clear.  NECK:  Supple, no jugular venous distention. No thyroid enlargement, no tenderness.  LUNGS: Normal breath sounds bilaterally, no wheezing, rales,rhonchi or crepitation. No use of accessory muscles of respiration.  CARDIOVASCULAR: S1, S2 normal. No murmurs, rubs, or gallops.  ABDOMEN: Soft, nontender, nondistended. Bowel sounds present. No organomegaly or mass.  EXTREMITIES: No pedal edema, cyanosis, or clubbing. Dressing on right foot present. NEUROLOGIC: Cranial  nerves II through XII are intact. Muscle strength 5/5 in all extremities. Sensation intact. Gait not checked.  PSYCHIATRIC: The patient is alert and oriented x 3.  SKIN: No obvious rash, lesion, or ulcer.   Physical Exam LABORATORY PANEL:   CBC  Recent Labs Lab 03/01/16 0325  WBC 13.3*  HGB 11.8*  HCT 33.0*  PLT 388   ------------------------------------------------------------------------------------------------------------------  Chemistries   Recent Labs Lab 02/29/16 1727 03/01/16 0325  NA 131* 130*  K 5.1 4.9  CL 100* 104  CO2 20* 19*  GLUCOSE 134* 175*  BUN 31* 27*  CREATININE 1.47* 1.30*  CALCIUM 10.9* 9.5  AST 39  --   ALT 89*  --   ALKPHOS 77  --   BILITOT 0.4  --    ------------------------------------------------------------------------------------------------------------------  Cardiac Enzymes No results for input(s): TROPONINI in the last 168 hours. ------------------------------------------------------------------------------------------------------------------  RADIOLOGY:  No results found.  ASSESSMENT AND PLAN:   Active Problems:   Cellulitis of toe, right  * Right great toe osteomyelitis On IV vancomycin and Zosyn. Consulted podiatry.   s/p amputation on 02/29/16 Check CBC and CMP. Blood cultures from surgical tissue ordered.  * Insulin-dependent diabetes mellitus Continue Lantus. Sliding scale insulin. Checked HbA1c- high.  Added meal coverage.  * Hypertension Continue home medication  * DVT prophylaxis with Lovenox   All the records are reviewed and case discussed with Care Management/Social Workerr. Management plans discussed with the patient, family and they are in agreement.  CODE STATUS: full.  TOTAL TIME TAKING CARE OF THIS PATIENT: 35 minutes.   POSSIBLE D/C IN 1-2 DAYS, DEPENDING ON CLINICAL CONDITION.   Altamese Dilling M.D on 03/01/2016   Between 7am to  6pm - Pager - [phone]  After 6pm go to  [email or web address] - Social research officer, government  [facility]  Office  [phone]  CC: Primary care physician; No PCP Per Patient  Note: This dictation was prepared with Dragon dictation along with smaller phrase technology. Any transcriptional errors that result from this process are unintentional.

## 2016-03-01 NOTE — Progress Notes (Signed)
Inpatient Diabetes Program Recommendations  AACE/ADA: New Consensus Statement on Inpatient Glycemic Control (2015)  Target Ranges:  Prepandial:   less than 140 mg/dL      Peak postprandial:   less than 180 mg/dL (1-2 hours)      Critically ill patients:  140 - 180 mg/dL  Results for Marc [name], Marc [name] (MRN [id number]) as of 03/01/2016 15:01  Ref. Range 02/29/2016 16:18 02/29/2016 21:07 02/29/2016 21:33 03/01/2016 07:43 03/01/2016 11:18 03/01/2016 13:15  Glucose-Capillary Latest Ref Range: 65-99 mg/dL 782 (H) 956 (H) 213 (H) 210 (H) 284 (H) 267 (H)   Results for Marc [name], Marc [name] (MRN [id number]) as of 03/01/2016 15:01  Ref. Range 02/29/2016 17:27  Hemoglobin A1C Latest Ref Range: 4.0-6.0 % 10.0 (H)   Review of Glycemic Control  Diabetes history: DM2 Outpatient Diabetes medications: Lantus 30 units QAM, Metformin 1000 mg QAM, Glipizide 10 mg QAM Current orders for Inpatient glycemic control: Lantus 26 units QHS, Novolog 0-15 units TID with meals, Novolog 0-5 units QHS, Amaryl 4 mg QAM  Inpatient Diabetes Program Recommendations: Insulin - Basal: Please consider increasing Lantus to 30 units QHS. HgbA1C: A1C 10% on 02/29/16 indiating an average glucose of 240 mg/dl over the past 2-3 months.  NOTE: Spoke with patient about diabetes and home regimen for diabetes control. Patient reports that he is followed by PCP for diabetes management and currently he takes Lantus 30 units QAM, Metformin 1000 mg QAM, and Glipizide 10 mg QAM as an outpatient for diabetes control. Inquired about Amaryl 4 mg daily as listed on home medication list and patient states that he is NOT on Amaryl and again states that he takes Glipizide. Patient reports that he is prescribed Metformin and Glipizide twice a day but he only takes them once a day. Inquired about why he is not taking as prescribed and patient states that he was having hypoglycemia so he cut back on the oral DM medications to once a day. taking insulin as prescribed and  that he last seen his PCP ?Marland Kitchen Patient reports that no changes were made with his insulin at his last office visit. Patient states that he is not checking his glucose because his glucometer broke in late December. Patient will need Rx for a new glucometer and testing supplies. Inquired about prior A1C and patient reports thatl his last A1C value was 11% about 6 months ago. Discussed A1C results (10.0% on 02/29/16) and explained that his current A1C indicates an average glucose of 240 mg/dl over the past 2-3 months. Discussed glucose and A1C goals. Discussed importance of checking CBGs and maintaining good CBG control to prevent long-term and short-term complications. Explained how hyperglycemia leads to damage within blood vessels which lead to the common complications seen with uncontrolled diabetes. Stressed to the patient the importance of improving glycemic control to prevent further complications from uncontrolled diabetes especially given his recent surgery. Discussed impact of nutrition, exercise, stress, sickness, and medications on diabetes control. Encouraged patient to check his glucose 3-4 times per day (before meals and at bedtime) and to keep a log book of glucose readings which he will need to take to doctor appointments. Explained how his doctor can use the log book to continue to make adjustments with DM medications if needed.Asked patient if he would be willing to take Metformin and Glipizide BID and patient states "I guess so. I don't have a lot of choice if I want my diabetes to get better."  Encouraged patient to talk with his doctor about whether  he needed to be referred to an Endocrinologist for assistance with improving diabetes control and also to keep his doctor informed if he is not taking medication as prescribed and if he is having issues with hypoglycemia.  Patient verbalized understanding of information discussed and he states that he has no further questions at this time related to  diabetes.  Thanks, Orlando Penner, RN, MSN, CDE Diabetes Coordinator Inpatient Diabetes Program [phone] (Team Pager) [phone] ([place] office) [phone] [facility] office) [phone] [facility] office)

## 2016-03-02 LAB — BASIC METABOLIC PANEL
ANION GAP: 4 — AB (ref 5–15)
BUN: 23 mg/dL — ABNORMAL HIGH (ref 6–20)
CALCIUM: 9.7 mg/dL (ref 8.9–10.3)
CO2: 25 mmol/L (ref 22–32)
Chloride: 104 mmol/L (ref 101–111)
Creatinine, Ser: 1.2 mg/dL (ref 0.61–1.24)
Glucose, Bld: 254 mg/dL — ABNORMAL HIGH (ref 65–99)
Potassium: 5.1 mmol/L (ref 3.5–5.1)
Sodium: 133 mmol/L — ABNORMAL LOW (ref 135–145)

## 2016-03-02 LAB — CBC WITH DIFFERENTIAL/PLATELET
BASOS ABS: 0.1 10*3/uL (ref 0–0.1)
Basophils Relative: 1 %
Eosinophils Absolute: 0.2 10*3/uL (ref 0–0.7)
Eosinophils Relative: 2 %
HEMATOCRIT: 32.7 % — AB (ref 40.0–52.0)
Hemoglobin: 11.6 g/dL — ABNORMAL LOW (ref 13.0–18.0)
LYMPHS ABS: 2.6 10*3/uL (ref 1.0–3.6)
LYMPHS PCT: 26 %
MCH: 33.2 pg (ref 26.0–34.0)
MCHC: 35.3 g/dL (ref 32.0–36.0)
MCV: 93.9 fL (ref 80.0–100.0)
MONO ABS: 0.7 10*3/uL (ref 0.2–1.0)
MONOS PCT: 7 %
NEUTROS ABS: 6.3 10*3/uL (ref 1.4–6.5)
Neutrophils Relative %: 64 %
Platelets: 394 10*3/uL (ref 150–440)
RBC: 3.48 MIL/uL — ABNORMAL LOW (ref 4.40–5.90)
RDW: 13.3 % (ref 11.5–14.5)
WBC: 9.8 10*3/uL (ref 3.8–10.6)

## 2016-03-02 LAB — GLUCOSE, CAPILLARY
GLUCOSE-CAPILLARY: 223 mg/dL — AB (ref 65–99)
Glucose-Capillary: 267 mg/dL — ABNORMAL HIGH (ref 65–99)

## 2016-03-02 LAB — SURGICAL PATHOLOGY

## 2016-03-02 MED ORDER — HYDROCODONE-ACETAMINOPHEN 5-325 MG PO TABS: 1.0000 | ORAL_TABLET | Freq: Four times a day (QID) | ORAL | Status: DC | PRN

## 2016-03-02 MED ORDER — AMOXICILLIN-POT CLAVULANATE 875-125 MG PO TABS: 1.0000 | ORAL_TABLET | Freq: Two times a day (BID) | ORAL | Status: DC

## 2016-03-02 MED ORDER — INSULIN GLARGINE 100 UNIT/ML ~~LOC~~ SOLN: 32.0000 [IU] | Freq: Every morning | SUBCUTANEOUS | Status: DC

## 2016-03-02 MED ORDER — GLIMEPIRIDE 4 MG PO TABS: 4.0000 mg | ORAL_TABLET | Freq: Every day | ORAL | Status: DC

## 2016-03-02 NOTE — Progress Notes (Signed)
2 Days Post-Op  Subjective: Patient seen. Doing well. No real complaint of pain.  Objective: Vital signs in last 24 hours: Temp:  [97.8 F (36.6 C)-98.4 F (36.9 C)] 97.8 F (36.6 C) (06/29 0334) Pulse Rate:  [57-58] 57 (06/29 0334) Resp:  [18-20] 18 (06/29 0334) BP: (114-128)/(69-71) 114/70 mmHg (06/29 0334) SpO2:  [99 %] 99 % (06/29 0334) Weight:  [107.049 kg (236 lb)] 107.049 kg (236 lb) (06/29 0335) Last BM Date: 03/01/16  Intake/Output from previous day: 06/28 0701 - 06/29 0700 In: 5350 [P.O.:840; I.V.:3260; IV Piggyback:1250] Out: 1800 [Urine:1800] Intake/Output this shift:    The bandages dry and intact. Upon removal the incision is well coapted with no drainage or purulence. There is still some moderate erythema at the amputation site. White count is back into the normal range.  Lab Results:   Recent Labs  03/01/16 0325 03/02/16 0346  WBC 13.3* 9.8  HGB 11.8* 11.6*  HCT 33.0* 32.7*  PLT 388 394   BMET  Recent Labs  03/01/16 0325 03/02/16 0346  NA 130* 133*  K 4.9 5.1  CL 104 104  CO2 19* 25  GLUCOSE 175* 254*  BUN 27* 23*  CREATININE 1.30* 1.20  CALCIUM 9.5 9.7   PT/INR No results for input(s): LABPROT, INR in the last 72 hours. ABG No results for input(s): PHART, HCO3 in the last 72 hours.  Invalid input(s): PCO2, PO2  Studies/Results: No results found.  Anti-infectives: Anti-infectives    Start     Dose/Rate Route Frequency Ordered Stop   03/01/16 0000  vancomycin (VANCOCIN) 1,250 mg in sodium chloride 0.9 % 250 mL IVPB     1,250 mg 166.7 mL/hr over 90 Minutes Intravenous Every 12 hours 02/29/16 1707     02/29/16 1800  vancomycin (VANCOCIN) 1,250 mg in sodium chloride 0.9 % 250 mL IVPB     1,250 mg 166.7 mL/hr over 90 Minutes Intravenous  Once 02/29/16 1706 02/29/16 1917   02/29/16 1715  vancomycin (VANCOCIN) 1,500 mg in sodium chloride 0.9 % 500 mL IVPB  Status:  Discontinued     1,500 mg 250 mL/hr over 120 Minutes Intravenous  Once  02/29/16 1704 02/29/16 1707   02/29/16 1700  piperacillin-tazobactam (ZOSYN) IVPB 4.5 g     4.5 g 25 mL/hr over 240 Minutes Intravenous Every 8 hours 02/29/16 1651        Assessment/Plan: s/p Procedure(s) with comments: AMPUTATION TOE (Right) - Great toe Assessment: Stable status post amputation right great toe.   Plan: Betadine and a sterile bandage reapplied to the right foot. Patient was instructed to keep the bandage clean dry and do not remove. Instructed to walk on the surgical shoe with only pressure on the heel. Patient should be stable for discharge today on oral antibiotics. Discussed with the patient that we may need to modify his antibiotics pending final culture results. Follow-up outpatient in the office next Wednesday for wound evaluation  LOS: 2 days    [name] 03/02/2016

## 2016-03-02 NOTE — Progress Notes (Signed)
Patient is alert and oriented, vss, no complaints of pain.  Plan is to d/c later this afternoon.  Resting comfortably.

## 2016-03-02 NOTE — Progress Notes (Signed)
Inpatient Diabetes Program Recommendations  AACE/ADA: New Consensus Statement on Inpatient Glycemic Control (2015)  Target Ranges:  Prepandial:   less than 140 mg/dL      Peak postprandial:   less than 180 mg/dL (1-2 hours)      Critically ill patients:  140 - 180 mg/dL   Lab Results  Component Value Date   GLUCAP 216* 03/01/2016   HGBA1C 10.0* 02/29/2016    Review of Glycemic Control  Results for AJDIN, LALLO (MRN [id number]) as of 03/02/2016 08:56  Ref. Range 02/29/2016 21:33 03/01/2016 07:43 03/01/2016 11:18 03/01/2016 13:15 03/01/2016 15:44 03/01/2016 17:09 03/01/2016 21:06 03/02/2016 07:58  Glucose-Capillary Latest Ref Range: 65-99 mg/dL 347 (H) 425 (H) 956 (H) 267 (H) 220 (H) 239 (H) 216 (H) 267 (H)     Diabetes history: DM2 Outpatient Diabetes medications: Lantus 30 units QAM, Metformin 1000 mg QAM, Glipizide 10 mg QAM Current orders for Inpatient glycemic control: Lantus 32 units QHS, Novolog 0-15 units TID with meals, Novolog 0-5 units QHS, Amaryl 4 mg QAM, Novolog 4 units tid   Inpatient Diabetes Program Recommendations: Please consider increasing Lantus to 38 units QHS (0.35 units/kg).   Consider increasing Novolog to 5 units tid with meals. Consider stopping Amaryl while inpatient.   Susette Racer, RN, BA, MHA, CDE Diabetes Coordinator Inpatient Diabetes Program  [phone] (Team Pager) [phone] [facility] Office) 03/02/2016 9:01 AM

## 2016-03-02 NOTE — Discharge Summary (Signed)
[facility] [place] at [facility]   PATIENT NAME: Marc [name]    MR#:  [id number]  DATE OF BIRTH:  [birth year]  DATE OF ADMISSION:  02/29/2016 ADMITTING PHYSICIAN: Milagros Loll, MD  DATE OF DISCHARGE: 03/02/2016  PRIMARY CARE PHYSICIAN: No PCP Per Patient    ADMISSION DIAGNOSIS:  osteomyelitis, cellutis r great toe n/a  DISCHARGE DIAGNOSIS:  Active Problems:   Cellulitis of toe, right   SECONDARY DIAGNOSIS:   Past Medical History  Diagnosis Date  . Diabetes mellitus without complication (HCC)   . Tobacco abuse   . Obesity   . Cardiomyopathy (HCC)     45-50%  . CHF (congestive heart failure) (HCC)   . Anxiety   . Medical non-compliance   . Chronic systolic heart failure (HCC) 07/2013    Ejection fraction of 45-50% with possible inferior wall hypokinesis  . Hypertension   . Hyperlipidemia   . MI (myocardial infarction) (HCC)   . Dysrhythmia   . Coronary artery disease     Significant left main and Severe three-vessel coronary artery disease in December of 2014. He underwent CABG in January of 2015 with LIMA to LAD, sequential SVG to ramus/OM 2 and SVG to OM 3    HOSPITAL COURSE:   * Right great toe osteomyelitis On IV vancomycin and Zosyn. Consulted podiatry.  s/p amputation on 02/29/16  Culture from surgical tissue is pending, but podiatry suggested to D/c as wound looks good, oral bx on discharge.   Will give augmentin for 8 days and let him follow with podiatry next week.  * Insulin-dependent diabetes mellitus Continue Lantus. Sliding scale insulin. Checked HbA1c- high.   Increased basal insulin dose.  * Hypertension Continue home medication  * DVT prophylaxis with Lovenox  DISCHARGE CONDITIONS:   Stable.  CONSULTS OBTAINED:  Treatment Team:  Linus Galas, DPM  DRUG ALLERGIES:  No Known Allergies  DISCHARGE MEDICATIONS:   Current Discharge Medication List    START taking these medications   Details   amoxicillin-clavulanate (AUGMENTIN) 875-125 MG tablet Take 1 tablet by mouth 2 (two) times daily. Qty: 16 tablet, Refills: 0    !! HYDROcodone-acetaminophen (NORCO/VICODIN) 5-325 MG tablet Take 1-2 tablets by mouth every 6 (six) hours as needed for moderate pain or severe pain. Qty: 30 tablet, Refills: 0     !! - Potential duplicate medications found. Please discuss with provider.    CONTINUE these medications which have CHANGED   Details  glimepiride (AMARYL) 4 MG tablet Take 1 tablet (4 mg total) by mouth daily with breakfast. Qty: 30 tablet, Refills: 0    insulin glargine (LANTUS) 100 UNIT/ML injection Inject 0.32 mLs (32 Units total) into the skin every morning. Qty: 10 mL, Refills: 11      CONTINUE these medications which have NOT CHANGED   Details  ALPRAZolam (XANAX) 0.25 MG tablet Take 0.25 mg by mouth at bedtime as needed for anxiety.    amiodarone (PACERONE) 400 MG tablet Take 0.5 tablets (200 mg total) by mouth 2 (two) times daily. Qty: 60 tablet, Refills: 1    carvedilol (COREG) 25 MG tablet Take 1 tablet (25 mg total) by mouth 2 (two) times daily. Qty: 60 tablet, Refills: 6    furosemide (LASIX) 40 MG tablet Take 1 tablet (40 mg total) by mouth daily. Qty: 30 tablet, Refills: 1    !! HYDROcodone-acetaminophen (NORCO/VICODIN) 5-325 MG tablet Take 1 tablet by mouth every 4 (four) hours as needed for moderate pain. Qty: 20 tablet,  Refills: 0    lisinopril (PRINIVIL,ZESTRIL) 20 MG tablet Take 1 tablet (20 mg total) by mouth daily. Qty: 30 tablet, Refills: 1    Multiple Vitamin (MULTIVITAMIN WITH MINERALS) TABS tablet Take 1 tablet by mouth daily.    simvastatin (ZOCOR) 20 MG tablet Take 1 tablet (20 mg total) by mouth daily at 6 PM. Qty: 30 tablet, Refills: 1     !! - Potential duplicate medications found. Please discuss with provider.    STOP taking these medications     glipiZIDE (GLUCOTROL) 10 MG tablet      cephALEXin (KEFLEX) 500 MG capsule       metFORMIN (GLUCOPHAGE) 1000 MG tablet      potassium chloride SA (K-DUR,KLOR-CON) 20 MEQ tablet      sulfamethoxazole-trimethoprim (BACTRIM DS,SEPTRA DS) 800-160 MG tablet          DISCHARGE INSTRUCTIONS:   Follow with podiatry clinic in 1 week.  If you experience worsening of your admission symptoms, develop shortness of breath, life threatening emergency, suicidal or homicidal thoughts you must seek medical attention immediately by calling 911 or calling your MD immediately  if symptoms less severe.  You Must read complete instructions/literature along with all the possible adverse reactions/side effects for all the Medicines you take and that have been prescribed to you. Take any new Medicines after you have completely understood and accept all the possible adverse reactions/side effects.   Please note  You were cared for by a hospitalist during your hospital stay. If you have any questions about your discharge medications or the care you received while you were in the hospital after you are discharged, you can call the unit and asked to speak with the hospitalist on call if the hospitalist that took care of you is not available. Once you are discharged, your primary care physician will handle any further medical issues. Please note that NO REFILLS for any discharge medications will be authorized once you are discharged, as it is imperative that you return to your primary care physician (or establish a relationship with a primary care physician if you do not have one) for your aftercare needs so that they can reassess your need for medications and monitor your lab values.    Today   CHIEF COMPLAINT:  No chief complaint on file.   HISTORY OF PRESENT ILLNESS:  Marc [name]  is a 55 y.o. male with a known history of DM, HTN, CAD presents to the hospital as a direct admission from podiatry clinic sent in by Dr. Alberteen Spindle. Patient has had chronic wound of his right foot which started out  as a callus. Over the last 10 days this has worsened. He was seen in the emergency room and started on Keflex and Bactrim with no improvement. X-ray checked one week back showed no osteomyelitis but an x-ray done today as outpatient showed changes of osteomyelitis. Patient does have neuropathy and has no significant pain. Has had chills. Patient believes he might have had fever at home but temperature was not checked. Blood sugars have been elevated close to 200.   VITAL SIGNS:  Blood pressure 157/93, pulse 57, temperature 97.7 F (36.5 C), temperature source Oral, resp. rate 18, height 6\' 1"  (1.854 m), weight 107.049 kg (236 lb), SpO2 100 %.  I/O:   Intake/Output Summary (Last 24 hours) at 03/02/16 1216 Last data filed at 03/02/16 0942  Gross per 24 hour  Intake   5350 ml  Output   1900 ml  Net  3450 ml    PHYSICAL EXAMINATION:   GENERAL: 55 y.o.-year-old patient lying in the bed with no acute distress.  EYES: Pupils equal, round, reactive to light and accommodation. No scleral icterus. Extraocular muscles intact.  HEENT: Head atraumatic, normocephalic. Oropharynx and nasopharynx clear.  NECK: Supple, no jugular venous distention. No thyroid enlargement, no tenderness.  LUNGS: Normal breath sounds bilaterally, no wheezing, rales,rhonchi or crepitation. No use of accessory muscles of respiration.  CARDIOVASCULAR: S1, S2 normal. No murmurs, rubs, or gallops.  ABDOMEN: Soft, nontender, nondistended. Bowel sounds present. No organomegaly or mass.  EXTREMITIES: No pedal edema, cyanosis, or clubbing. Dressing on right foot present. NEUROLOGIC: Cranial nerves II through XII are intact. Muscle strength 5/5 in all extremities. Sensation intact. Gait not checked.  PSYCHIATRIC: The patient is alert and oriented x 3.  SKIN: No obvious rash, lesion, or ulcer.   DATA REVIEW:   CBC  Recent Labs Lab 03/02/16 0346  WBC 9.8  HGB 11.6*  HCT 32.7*  PLT 394    Chemistries    Recent Labs Lab 02/29/16 1727  03/02/16 0346  NA 131*  < > 133*  K 5.1  < > 5.1  CL 100*  < > 104  CO2 20*  < > 25  GLUCOSE 134*  < > 254*  BUN 31*  < > 23*  CREATININE 1.47*  < > 1.20  CALCIUM 10.9*  < > 9.7  AST 39  --   --   ALT 89*  --   --   ALKPHOS 77  --   --   BILITOT 0.4  --   --   < > = values in this interval not displayed.  Cardiac Enzymes No results for input(s): TROPONINI in the last 168 hours.  Microbiology Results  Results for orders placed or performed during the hospital encounter of 02/29/16  Surgical pcr screen     Status: None   Collection Time: 02/29/16  5:05 PM  Result Value Ref Range Status   MRSA, PCR NEGATIVE NEGATIVE Final   Staphylococcus aureus NEGATIVE NEGATIVE Final    Comment:        The Xpert SA Assay (FDA approved for NASAL specimens in patients over 79 years of age), is one component of a comprehensive surveillance program.  Test performance has been validated by [facility] for patients greater than or equal to 5 year old. It is not intended to diagnose infection nor to guide or monitor treatment.   CULTURE, BLOOD (ROUTINE X 2) w Reflex to ID Panel     Status: None (Preliminary result)   Collection Time: 02/29/16  5:11 PM  Result Value Ref Range Status   Specimen Description BLOOD LEFT ASSIST CONTROL  Final   Special Requests   Final    BOTTLES DRAWN AEROBIC AND ANAEROBIC  AERO 15CC ANA 12CC   Culture NO GROWTH 2 DAYS  Final   Report Status PENDING  Incomplete  CULTURE, BLOOD (ROUTINE X 2) w Reflex to ID Panel     Status: None (Preliminary result)   Collection Time: 02/29/16  5:12 PM  Result Value Ref Range Status   Specimen Description BLOOD LEFT HAND  Final   Special Requests   Final    BOTTLES DRAWN AEROBIC AND ANAEROBIC  AERO 10CC ANA 5CC   Culture NO GROWTH 2 DAYS  Final   Report Status PENDING  Incomplete  Aerobic/Anaerobic Culture (surgical/deep wound)     Status: None (Preliminary result)   Collection Time:  02/29/16  8:43 PM  Result Value Ref Range Status   Specimen Description BONE RIGHT TOE  Final   Special Requests AMPUTATION OF RIGHT GREAT TOE  Final   Gram Stain   Final    RARE WBC PRESENT, PREDOMINANTLY PMN RARE GRAM POSITIVE COCCI IN PAIRS Performed at [facility]    Culture PENDING  Incomplete   Report Status PENDING  Incomplete    RADIOLOGY:  No results found.  EKG:   Orders placed or performed in visit on 09/18/14  . EKG 12-Lead      Management plans discussed with the patient, family and they are in agreement.  CODE STATUS:     Code Status Orders        Start     Ordered   02/29/16 1611  Full code   Continuous     02/29/16 1611    Code Status History    Date Active Date Inactive Code Status Order ID Comments User Context   09/18/2013  8:24 AM 09/24/2013  5:59 PM Full Code [id number]  [name], MD Inpatient   09/16/2013  1:22 PM 09/18/2013  8:24 AM Full Code [id number]  Ardelle Balls, PA-C Inpatient      TOTAL TIME TAKING CARE OF THIS PATIENT: 35 minutes.    Altamese Dilling M.D on 03/02/2016 at 12:16 PM  Between 7am to 6pm - Pager - [phone]  After 6pm go to [email or web address] - Social research officer, government  [facility]  Office  [phone]  CC: Primary care physician; No PCP Per Patient   Note: This dictation was prepared with Dragon dictation along with smaller phrase technology. Any transcriptional errors that result from this process are unintentional.

## 2016-03-02 NOTE — Progress Notes (Signed)
Removed PIV.  Given Rx.  F/u Appt made with Dr. Alberteen Spindle.  No questions at this time.  Escorted out of hospital via wheelchair by nursing.

## 2016-03-05 LAB — CULTURE, BLOOD (ROUTINE X 2)
CULTURE: NO GROWTH
Culture: NO GROWTH

## 2016-03-07 LAB — AEROBIC/ANAEROBIC CULTURE W GRAM STAIN (SURGICAL/DEEP WOUND)

## 2016-03-07 LAB — AEROBIC/ANAEROBIC CULTURE (SURGICAL/DEEP WOUND)

## 2018-03-11 ENCOUNTER — Encounter: Payer: Self-pay | Admitting: Emergency Medicine

## 2018-03-11 ENCOUNTER — Emergency Department
Admission: EM | Admit: 2018-03-11 | Discharge: 2018-03-11 | Disposition: A | Discharge status: Home / Self Care | Payer: Medicaid Other | Attending: Emergency Medicine | Admitting: Emergency Medicine

## 2018-03-11 DIAGNOSIS — Z5321 Procedure and treatment not carried out due to patient leaving prior to being seen by health care provider: Secondary | ICD-10-CM | POA: Diagnosis not present

## 2018-03-11 DIAGNOSIS — R51 Headache: Secondary | ICD-10-CM | POA: Diagnosis not present

## 2018-03-11 NOTE — ED Triage Notes (Signed)
Patient presents to the ED via EMS for headache and dizziness.  Patient states his headache has now completely resolved and patient wants to leave the ED.  Patient agreed to be triaged for stability prior to leaving.

## 2018-03-12 ENCOUNTER — Telehealth: Payer: Self-pay | Admitting: Emergency Medicine

## 2018-03-12 NOTE — Telephone Encounter (Signed)
Called patient due to lwot to inquire about condition and follow up plans. Says he still feels okay.  I told him he should at least let his doctor know what happened. And that he should return if it comes back.

## 2018-03-13 ENCOUNTER — Emergency Department: Payer: Medicaid Other

## 2018-03-13 ENCOUNTER — Other Ambulatory Visit: Payer: Self-pay

## 2018-03-13 ENCOUNTER — Inpatient Hospital Stay
Admission: EM | Admit: 2018-03-13 | Discharge: 2018-03-15 | DRG: 065 | Disposition: A | Discharge status: Home / Self Care | Payer: Medicaid Other | Attending: Internal Medicine | Admitting: Internal Medicine

## 2018-03-13 DIAGNOSIS — E785 Hyperlipidemia, unspecified: Secondary | ICD-10-CM | POA: Diagnosis present

## 2018-03-13 DIAGNOSIS — E669 Obesity, unspecified: Secondary | ICD-10-CM | POA: Diagnosis present

## 2018-03-13 DIAGNOSIS — H538 Other visual disturbances: Secondary | ICD-10-CM | POA: Diagnosis present

## 2018-03-13 DIAGNOSIS — Z9114 Patient's other noncompliance with medication regimen: Secondary | ICD-10-CM

## 2018-03-13 DIAGNOSIS — I252 Old myocardial infarction: Secondary | ICD-10-CM

## 2018-03-13 DIAGNOSIS — Z7984 Long term (current) use of oral hypoglycemic drugs: Secondary | ICD-10-CM

## 2018-03-13 DIAGNOSIS — I429 Cardiomyopathy, unspecified: Secondary | ICD-10-CM | POA: Diagnosis present

## 2018-03-13 DIAGNOSIS — F419 Anxiety disorder, unspecified: Secondary | ICD-10-CM | POA: Diagnosis present

## 2018-03-13 DIAGNOSIS — I63531 Cerebral infarction due to unspecified occlusion or stenosis of right posterior cerebral artery: Principal | ICD-10-CM | POA: Diagnosis present

## 2018-03-13 DIAGNOSIS — I5022 Chronic systolic (congestive) heart failure: Secondary | ICD-10-CM | POA: Diagnosis present

## 2018-03-13 DIAGNOSIS — I2510 Atherosclerotic heart disease of native coronary artery without angina pectoris: Secondary | ICD-10-CM | POA: Diagnosis present

## 2018-03-13 DIAGNOSIS — I110 Hypertensive heart disease with heart failure: Secondary | ICD-10-CM | POA: Diagnosis present

## 2018-03-13 DIAGNOSIS — Z951 Presence of aortocoronary bypass graft: Secondary | ICD-10-CM

## 2018-03-13 DIAGNOSIS — R29701 NIHSS score 1: Secondary | ICD-10-CM | POA: Diagnosis present

## 2018-03-13 DIAGNOSIS — F17210 Nicotine dependence, cigarettes, uncomplicated: Secondary | ICD-10-CM | POA: Diagnosis present

## 2018-03-13 DIAGNOSIS — I10 Essential (primary) hypertension: Secondary | ICD-10-CM | POA: Diagnosis present

## 2018-03-13 DIAGNOSIS — I5023 Acute on chronic systolic (congestive) heart failure: Secondary | ICD-10-CM | POA: Diagnosis present

## 2018-03-13 DIAGNOSIS — I639 Cerebral infarction, unspecified: Secondary | ICD-10-CM

## 2018-03-13 DIAGNOSIS — Z79899 Other long term (current) drug therapy: Secondary | ICD-10-CM

## 2018-03-13 DIAGNOSIS — E1165 Type 2 diabetes mellitus with hyperglycemia: Secondary | ICD-10-CM | POA: Diagnosis present

## 2018-03-13 DIAGNOSIS — Z89421 Acquired absence of other right toe(s): Secondary | ICD-10-CM

## 2018-03-13 DIAGNOSIS — I6523 Occlusion and stenosis of bilateral carotid arteries: Secondary | ICD-10-CM | POA: Diagnosis present

## 2018-03-13 DIAGNOSIS — E119 Type 2 diabetes mellitus without complications: Secondary | ICD-10-CM

## 2018-03-13 DIAGNOSIS — Z6833 Body mass index (BMI) 33.0-33.9, adult: Secondary | ICD-10-CM

## 2018-03-13 DIAGNOSIS — I480 Paroxysmal atrial fibrillation: Secondary | ICD-10-CM | POA: Diagnosis present

## 2018-03-13 LAB — BASIC METABOLIC PANEL
Anion gap: 9 (ref 5–15)
BUN: 39 mg/dL — ABNORMAL HIGH (ref 6–20)
CHLORIDE: 102 mmol/L (ref 98–111)
CO2: 25 mmol/L (ref 22–32)
CREATININE: 1.75 mg/dL — AB (ref 0.61–1.24)
Calcium: 11.3 mg/dL — ABNORMAL HIGH (ref 8.9–10.3)
GFR calc Af Amer: 48 mL/min — ABNORMAL LOW (ref >60)
GFR calc non Af Amer: 41 mL/min — ABNORMAL LOW (ref >60)
Glucose, Bld: 126 mg/dL — ABNORMAL HIGH (ref 70–99)
Potassium: 4.8 mmol/L (ref 3.5–5.1)
Sodium: 136 mmol/L (ref 135–145)

## 2018-03-13 LAB — CBC
HCT: 38.3 % — ABNORMAL LOW (ref 40.0–52.0)
HEMOGLOBIN: 13.2 g/dL (ref 13.0–18.0)
MCH: 33 pg (ref 26.0–34.0)
MCHC: 34.5 g/dL (ref 32.0–36.0)
MCV: 95.5 fL (ref 80.0–100.0)
Platelets: 237 10*3/uL (ref 150–440)
RBC: 4.01 MIL/uL — AB (ref 4.40–5.90)
RDW: 13.3 % (ref 11.5–14.5)
WBC: 9.4 10*3/uL (ref 3.8–10.6)

## 2018-03-13 LAB — TROPONIN I: Troponin I: 0.04 ng/mL (ref <0.03)

## 2018-03-13 MED ORDER — ASPIRIN 81 MG PO CHEW
324.0000 mg | CHEWABLE_TABLET | Freq: Once | ORAL | Status: AC
  Administered 2018-03-13: 324 mg via ORAL
  Filled 2018-03-13: qty 4

## 2018-03-13 NOTE — ED Provider Notes (Signed)
[facility] Emergency Department Provider Note ____________________________________________   First MD Initiated Contact with Patient 03/13/18 2203     (approximate)  I have reviewed the triage vital signs and the nursing notes.   HISTORY  Chief Complaint Hypertension   HPI Marc [name] is a 57 y.o. male patient with right intermittent headache over the past 3 to 4 days.  Thinks it may be associated with hypertension.  Says that he also has bilateral blurred vision without any hemifield cuts.  Says that he also feels unsteady on his feet and intermittently dizzy.  Says that he is a long history of migraines but he says that this feels slightly different but cannot explain exactly how.  Denies taking aspirin.  No history of stroke.  Patient also with elevated blood pressure but is normalized here in the emergency department.  Past Medical History:  Diagnosis Date  . Anxiety   . Cardiomyopathy (HCC)    45-50%  . CHF (congestive heart failure) (HCC)   . Chronic systolic heart failure (HCC) 07/2013   Ejection fraction of 45-50% with possible inferior wall hypokinesis  . Coronary artery disease    Significant left main and Severe three-vessel coronary artery disease in December of 2014. He underwent CABG in January of 2015 with LIMA to LAD, sequential SVG to ramus/OM 2 and SVG to OM 3  . Diabetes mellitus without complication (HCC)   . Dysrhythmia   . Hyperlipidemia   . Hypertension   . Medical non-compliance   . MI (myocardial infarction) (HCC)   . Obesity   . Tobacco abuse     Patient Active Problem List   Diagnosis Date Noted  . Cellulitis of toe, right 02/29/2016  . Encounter for therapeutic drug monitoring 09/26/2013  . CAD (coronary artery disease) 09/16/2013  . Coronary artery disease   . Atrial fibrillation (HCC) 07/29/2013  . Hypertension   . Hyperlipidemia   . Chronic systolic heart failure (HCC) 07/05/2013    Past Surgical History:   Procedure Laterality Date  . AMPUTATION TOE Right 02/29/2016   Procedure: AMPUTATION TOE;  Surgeon: Linus Galas, DPM;  Location: [facility] ORS;  Service: Podiatry;  Laterality: Right;  Great toe  . CARDIAC CATHETERIZATION     [facility] no stent  . CORONARY ARTERY BYPASS GRAFT N/A 09/16/2013   Procedure: CORONARY ARTERY BYPASS GRAFTING (CABG);  Surgeon: [name], MD;  Location: [facility] OR;  Service: Open Heart Surgery;  Laterality: N/A;  CABG x four, using left internal mammary artery and right leg greater saphenous vein  . INTRAOPERATIVE TRANSESOPHAGEAL ECHOCARDIOGRAM N/A 09/16/2013   Procedure: INTRAOPERATIVE TRANSESOPHAGEAL ECHOCARDIOGRAM;  Surgeon: [name], MD;  Location: [facility] OR;  Service: Open Heart Surgery;  Laterality: N/A;  . knee surgery Right   . LASIK Bilateral     Prior to Admission medications   Medication Sig Start Date End Date Taking? Authorizing Provider  ALPRAZolam Prudy Feeler) 0.25 MG tablet Take 0.25 mg by mouth at bedtime as needed for anxiety.    [provider]  amiodarone (PACERONE) 400 MG tablet Take 0.5 tablets (200 mg total) by mouth 2 (two) times daily. 09/24/13   Gold, Wayne E, PA-C  amoxicillin-clavulanate (AUGMENTIN) 875-125 MG tablet Take 1 tablet by mouth 2 (two) times daily. 03/02/16   Altamese Dilling, MD  carvedilol (COREG) 25 MG tablet Take 1 tablet (25 mg total) by mouth 2 (two) times daily. 06/26/14   Antonieta Iba, MD  furosemide (LASIX) 40 MG tablet  Take 1 tablet (40 mg total) by mouth daily. 05/05/14   Iran Ouch, MD  glimepiride (AMARYL) 4 MG tablet Take 1 tablet (4 mg total) by mouth daily with breakfast. 03/02/16   Altamese Dilling, MD  HYDROcodone-acetaminophen (NORCO/VICODIN) 5-325 MG tablet Take 1 tablet by mouth every 4 (four) hours as needed for moderate pain. 02/22/16   Tommi Rumps, PA-C  HYDROcodone-acetaminophen (NORCO/VICODIN) 5-325 MG tablet Take 1-2 tablets by mouth every 6 (six) hours as needed for moderate pain  or severe pain. 03/02/16   Altamese Dilling, MD  insulin glargine (LANTUS) 100 UNIT/ML injection Inject 0.32 mLs (32 Units total) into the skin every morning. 03/02/16   Altamese Dilling, MD  lisinopril (PRINIVIL,ZESTRIL) 20 MG tablet Take 1 tablet (20 mg total) by mouth daily. 09/24/13   Rowe Clack, PA-C  metFORMIN (GLUCOPHAGE) 1000 MG tablet Take 1 tablet (1,000 mg total) by mouth 2 (two) times daily with a meal. Patient taking differently: Take 1,000 mg by mouth 2 (two) times daily with a meal. Prescribed Metformin 1000 mg BID but patient reports he only takes once a day 07/22/13   Iran Ouch, MD  Multiple Vitamin (MULTIVITAMIN WITH MINERALS) TABS tablet Take 1 tablet by mouth daily.    [provider]  simvastatin (ZOCOR) 20 MG tablet Take 1 tablet (20 mg total) by mouth daily at 6 PM. 09/24/13   Gold, Glenice Laine, PA-C    Allergies Patient has no known allergies.  Family History  Problem Relation Age of Onset  . Heart disease Mother     Social History Social History   Tobacco Use  . Smoking status: Current Every Day Smoker    Packs/day: 1.00    Years: 45.00    Pack years: 45.00    Types: Cigarettes  . Smokeless tobacco: Never Used  Substance Use Topics  . Alcohol use: No    Alcohol/week: 0.0 oz    Comment: occassional  . Drug use: No    Review of Systems  Constitutional: No fever/chills Eyes: As above ENT: No sore throat. Cardiovascular: Denies chest pain. Respiratory: Denies shortness of breath. Gastrointestinal: No abdominal pain.  No nausea, no vomiting.  No diarrhea.  No constipation. Genitourinary: Negative for dysuria. Musculoskeletal: Negative for back pain. Skin: Negative for rash. Neurological: Negative for headaches, focal weakness or numbness.   ____________________________________________   PHYSICAL EXAM:  VITAL SIGNS: ED Triage Vitals  Enc Vitals Group     BP 03/13/18 2044 114/70     Pulse Rate 03/13/18 2044 (!) 51      Resp 03/13/18 2044 18     Temp 03/13/18 2044 98.2 F (36.8 C)     Temp Source 03/13/18 2044 Oral     SpO2 03/13/18 2044 99 %     Weight 03/13/18 2044 250 lb (113.4 kg)     Height 03/13/18 2044 6' (1.829 m)     Head Circumference --      Peak Flow --      Pain Score 03/13/18 2052 1     Pain Loc --      Pain Edu? --      Excl. in GC? --     Constitutional: Alert and oriented. Well appearing and in no acute distress. Eyes: Conjunctivae are normal.  EOMI.  PERRL.  No nystagmus. Head: Atraumatic.  Normal TMs bilaterally. Nose: No congestion/rhinnorhea. Mouth/Throat: Mucous membranes are moist.  Neck: No stridor.   Cardiovascular: Normal rate, regular rhythm. Grossly normal heart sounds.  Respiratory: Normal respiratory effort.  No retractions. Lungs CTAB. Gastrointestinal: Soft and nontender. No distention. Musculoskeletal: No lower extremity tenderness nor edema.  No joint effusions. Neurologic:  Normal speech and language.  No ataxia on finger-nose testing.  No abnormality on dysdiadochokinesia.  Amylase with a normal gait. Skin:  Skin is warm, dry and intact. No rash noted. Psychiatric: Mood and affect are normal. Speech and behavior are normal.  ____________________________________________   LABS (all labs ordered are listed, but only abnormal results are displayed)  Labs Reviewed  BASIC METABOLIC PANEL - Abnormal; Notable for the following components:      Result Value   Glucose, Bld 126 (*)    BUN 39 (*)    Creatinine, Ser 1.75 (*)    Calcium 11.3 (*)    GFR calc non Af Amer 41 (*)    GFR calc Af Amer 48 (*)    All other components within normal limits  CBC - Abnormal; Notable for the following components:   RBC 4.01 (*)    HCT 38.3 (*)    All other components within normal limits  TROPONIN I - Abnormal; Notable for the following components:   Troponin I 0.04 (*)    All other components within normal limits    ____________________________________________  EKG  ED ECG REPORT I, Arelia Longest, the attending physician, personally viewed and interpreted this ECG.   Date: 03/13/2018  EKG Time: 2057  Rate: 63  Rhythm: sinus bradycardia with intermittent PVCs  Axis: Normal  Intervals:first-degree A-V block   ST&T Change: No ST segment elevation or depression.  No abnormal T wave inversion.  ____________________________________________  RADIOLOGY  Subcortical white matter low densities noted in right posterior parietal cortex concerning for infarction of indeterminate age.  MRI recommended. ____________________________________________   PROCEDURES  Procedure(s) performed:   Procedures  Critical Care performed:   ____________________________________________   INITIAL IMPRESSION / ASSESSMENT AND PLAN / ED COURSE  Pertinent labs & imaging results that were available during my care of the patient were reviewed by me and considered in my medical decision making (see chart for details).  Differential diagnosis includes, but is not limited to, intracranial hemorrhage, meningitis/encephalitis, previous head trauma, cavernous venous thrombosis, tension headache, temporal arteritis, migraine or migraine equivalent, idiopathic intracranial hypertension, and non-specific headache. As part of my medical decision making, I reviewed the following data within the electronic MEDICAL RECORD NUMBER Notes from prior ED visits  ----------------------------------------- 11:27 PM on 03/13/2018 -----------------------------------------  Patient with neurologic complaints with possible stroke.  Will be given aspirin and admitted to the hospital.  Signed out to Dr. Anne Hahn. ____________________________________________   FINAL CLINICAL IMPRESSION(S) / ED DIAGNOSES  CVA.    NEW MEDICATIONS STARTED DURING THIS VISIT:  New Prescriptions   No medications on file     Note:  This document was  prepared using Dragon voice recognition software and may include unintentional dictation errors.     Myrna Blazer, MD 03/13/18 [phone]

## 2018-03-13 NOTE — ED Notes (Signed)
Pt reports that he took 2 of his BP meds today when his BP was elevated (in the 170's). Pt then reported feeling dizzy and vision blurriness. Pt's BP is currently 117/67 and he states that this is his normal but he is still having the blurriness.

## 2018-03-13 NOTE — ED Triage Notes (Signed)
Pt arrives to ED c/o HTN. Takes medication for it. Son and daughter in law in triage with pt. They state that pt is having trouble hearing. Pt states since last Friday. Has hx of migraines. "a little" blurred vision that started Friday, states intermittent. States when BP goes high his vision goes blurry. Alert, oriented, talking in complete sentences. States slight HA, points to FA.

## 2018-03-14 ENCOUNTER — Observation Stay (HOSPITAL_BASED_OUTPATIENT_CLINIC_OR_DEPARTMENT_OTHER)
Admit: 2018-03-14 | Discharge: 2018-03-14 | Disposition: A | Discharge status: Home / Self Care | Payer: Medicaid Other | Attending: Internal Medicine | Admitting: Internal Medicine

## 2018-03-14 ENCOUNTER — Other Ambulatory Visit: Payer: Self-pay

## 2018-03-14 ENCOUNTER — Observation Stay: Payer: Medicaid Other

## 2018-03-14 DIAGNOSIS — I5030 Unspecified diastolic (congestive) heart failure: Secondary | ICD-10-CM

## 2018-03-14 DIAGNOSIS — I639 Cerebral infarction, unspecified: Secondary | ICD-10-CM

## 2018-03-14 DIAGNOSIS — E119 Type 2 diabetes mellitus without complications: Secondary | ICD-10-CM

## 2018-03-14 DIAGNOSIS — G459 Transient cerebral ischemic attack, unspecified: Secondary | ICD-10-CM

## 2018-03-14 LAB — LIPID PANEL
CHOL/HDL RATIO: 3.7 ratio
Cholesterol: 106 mg/dL (ref 0–200)
HDL: 29 mg/dL — ABNORMAL LOW (ref >40)
LDL CALC: 51 mg/dL (ref 0–99)
Triglycerides: 132 mg/dL (ref <150)
VLDL: 26 mg/dL (ref 0–40)

## 2018-03-14 LAB — GLUCOSE, CAPILLARY
Glucose-Capillary: 272 mg/dL — ABNORMAL HIGH (ref 70–99)
Glucose-Capillary: 184 mg/dL — ABNORMAL HIGH (ref 70–99)

## 2018-03-14 LAB — HEMOGLOBIN A1C
Hgb A1c MFr Bld: 9 % — ABNORMAL HIGH (ref 4.8–5.6)
Mean Plasma Glucose: 211.6 mg/dL

## 2018-03-14 LAB — ECHOCARDIOGRAM COMPLETE
HEIGHTINCHES: 73 in
WEIGHTICAEL: 4027.2 [oz_av]

## 2018-03-14 MED ORDER — ENOXAPARIN SODIUM 40 MG/0.4ML ~~LOC~~ SOLN
40.0000 mg | SUBCUTANEOUS | Status: DC
  Administered 2018-03-14: 22:00:00 40 mg via SUBCUTANEOUS
  Filled 2018-03-14: qty 0.4

## 2018-03-14 MED ORDER — ACETAMINOPHEN 160 MG/5ML PO SOLN
650.0000 mg | ORAL | Status: DC | PRN
Start: 2018-03-14 — End: 2018-03-15
  Filled 2018-03-14: qty 20.3

## 2018-03-14 MED ORDER — NICOTINE 21 MG/24HR TD PT24
21.0000 mg | MEDICATED_PATCH | Freq: Every day | TRANSDERMAL | Status: DC
  Administered 2018-03-14 – 2018-03-15 (×2): 21 mg via TRANSDERMAL
  Filled 2018-03-14 (×2): qty 1

## 2018-03-14 MED ORDER — LISINOPRIL 20 MG PO TABS
20.0000 mg | ORAL_TABLET | Freq: Every day | ORAL | Status: DC
  Administered 2018-03-14 – 2018-03-15 (×2): 20 mg via ORAL
  Filled 2018-03-14 (×2): qty 1

## 2018-03-14 MED ORDER — GADOBENATE DIMEGLUMINE 529 MG/ML IV SOLN
20.0000 mL | Freq: Once | INTRAVENOUS | Status: AC | PRN
  Administered 2018-03-14: 16:00:00 10 mL via INTRAVENOUS

## 2018-03-14 MED ORDER — ACETAMINOPHEN 325 MG PO TABS
650.0000 mg | ORAL_TABLET | ORAL | Status: DC | PRN
  Administered 2018-03-14: 12:00:00 650 mg via ORAL
  Filled 2018-03-14: qty 2

## 2018-03-14 MED ORDER — INSULIN ASPART 100 UNIT/ML ~~LOC~~ SOLN
0.0000 [IU] | Freq: Three times a day (TID) | SUBCUTANEOUS | Status: DC
  Administered 2018-03-14: 17:00:00 2 [IU] via SUBCUTANEOUS
  Administered 2018-03-14: 5 [IU] via SUBCUTANEOUS
  Administered 2018-03-15: 08:00:00 3 [IU] via SUBCUTANEOUS
  Filled 2018-03-14 (×3): qty 1

## 2018-03-14 MED ORDER — SIMVASTATIN 20 MG PO TABS
20.0000 mg | ORAL_TABLET | Freq: Every day | ORAL | Status: DC
  Administered 2018-03-14: 20 mg via ORAL
  Filled 2018-03-14: qty 1

## 2018-03-14 MED ORDER — AMIODARONE HCL 200 MG PO TABS
200.0000 mg | ORAL_TABLET | Freq: Two times a day (BID) | ORAL | Status: DC
  Administered 2018-03-14 – 2018-03-15 (×3): 200 mg via ORAL
  Filled 2018-03-14 (×3): qty 1

## 2018-03-14 MED ORDER — PERFLUTREN LIPID MICROSPHERE
1.0000 mL | INTRAVENOUS | Status: AC | PRN
  Administered 2018-03-14: 3 mL via INTRAVENOUS
  Filled 2018-03-14: qty 10

## 2018-03-14 MED ORDER — ALUM & MAG HYDROXIDE-SIMETH 200-200-20 MG/5ML PO SUSP
30.0000 mL | Freq: Four times a day (QID) | ORAL | Status: DC | PRN
Start: 2018-03-14 — End: 2018-03-15
  Administered 2018-03-14 (×2): 30 mL via ORAL
  Filled 2018-03-14 (×2): qty 30

## 2018-03-14 MED ORDER — STROKE: EARLY STAGES OF RECOVERY BOOK
Freq: Once | Status: AC
  Administered 2018-03-14: 03:00:00

## 2018-03-14 MED ORDER — CARVEDILOL 25 MG PO TABS
25.0000 mg | ORAL_TABLET | Freq: Two times a day (BID) | ORAL | Status: DC
  Administered 2018-03-14 – 2018-03-15 (×2): 25 mg via ORAL
  Filled 2018-03-14 (×4): qty 1

## 2018-03-14 MED ORDER — LORAZEPAM 2 MG/ML IJ SOLN
2.0000 mg | Freq: Once | INTRAMUSCULAR | Status: AC
  Administered 2018-03-14: 15:00:00 2 mg via INTRAVENOUS
  Filled 2018-03-14: qty 1

## 2018-03-14 MED ORDER — HYDROCODONE-ACETAMINOPHEN 5-325 MG PO TABS: 1.0000 | ORAL_TABLET | ORAL | Status: DC | PRN

## 2018-03-14 MED ORDER — ALPRAZOLAM 0.25 MG PO TABS
0.2500 mg | ORAL_TABLET | Freq: Every evening | ORAL | Status: DC | PRN
  Administered 2018-03-14 (×2): 0.25 mg via ORAL
  Filled 2018-03-14 (×2): qty 1

## 2018-03-14 MED ORDER — ACETAMINOPHEN 650 MG RE SUPP: 650.0000 mg | RECTAL | Status: DC | PRN

## 2018-03-14 NOTE — Progress Notes (Signed)
Patient admitted this morning for severe headache, blurred vision and found to have acute right parietal stroke. Complains of no visual defects now. Initial CT of the head showed right parietal stroke, admitted to stroke unit. Vitals, labs reviewed.  Physical examination: alert, awake, oriented. Cardiovascular ;system S1, S2 regular. Lungs; clear to auscultation, no wheezing. Neurological: patient alert, awake, oriented. Cranial nerves 2 to 12 intact, 4505 upper and lower extremities, sensations that entire bilaterally, patient extraocular movements and visual activities intact at this time. NIH stroke scale is zero.  Assessment of plan: 57 year old male with acute right parietal stroke: continue : aspirin, statins, finished stroke workup including echocardiogram, brain MRI, ultrasound of carotid, likely discharged later today he wants to go home. He has no focal neurological deficit.    Medical problems, medications reviewed   time Spent 25 minutes

## 2018-03-14 NOTE — Consult Note (Signed)
Referring Physician: Luberta Mutter    Chief Complaint: Headache and blurred vision  HPI: Marc [name] is an 57 y.o. male who reports that for the past week he has had a headache.  On the day of admission BP became elevated and also experienced blurry vision and dizziness.  With no improvement patient presented for evaluation.  Initial NIHSS of 1.  Today vision and BP improved.  Not taking prescribed ASA   Date last known well: Date: 03/13/2018 Time last known well: Time: 10:00  tPA Given: No: Outside time window  Past Medical History:  Diagnosis Date  . Anxiety   . Cardiomyopathy (HCC)    45-50%  . CHF (congestive heart failure) (HCC)   . Chronic systolic heart failure (HCC) 07/2013   Ejection fraction of 45-50% with possible inferior wall hypokinesis  . Coronary artery disease    Significant left main and Severe three-vessel coronary artery disease in December of 2014. He underwent CABG in January of 2015 with LIMA to LAD, sequential SVG to ramus/OM 2 and SVG to OM 3  . Diabetes mellitus without complication (HCC)   . Dysrhythmia   . Hyperlipidemia   . Hypertension   . Medical non-compliance   . MI (myocardial infarction) (HCC)   . Obesity   . Tobacco abuse     Past Surgical History:  Procedure Laterality Date  . AMPUTATION TOE Right 02/29/2016   Procedure: AMPUTATION TOE;  Surgeon: Linus Galas, DPM;  Location: [facility] ORS;  Service: Podiatry;  Laterality: Right;  Great toe  . CARDIAC CATHETERIZATION     [facility] no stent  . CORONARY ARTERY BYPASS GRAFT N/A 09/16/2013   Procedure: CORONARY ARTERY BYPASS GRAFTING (CABG);  Surgeon: [name], MD;  Location: [facility] OR;  Service: Open Heart Surgery;  Laterality: N/A;  CABG x four, using left internal mammary artery and right leg greater saphenous vein  . INTRAOPERATIVE TRANSESOPHAGEAL ECHOCARDIOGRAM N/A 09/16/2013   Procedure: INTRAOPERATIVE TRANSESOPHAGEAL ECHOCARDIOGRAM;  Surgeon: [name], MD;  Location: [facility] OR;  Service:  Open Heart Surgery;  Laterality: N/A;  . knee surgery Right   . LASIK Bilateral     Family History  Problem Relation Age of Onset  . Heart disease Mother    Social History:  reports that he has been smoking cigarettes.  He has a 45.00 pack-year smoking history. He has never used smokeless tobacco. He reports that he does not drink alcohol or use drugs.  Allergies: No Known Allergies  Medications:  I have reviewed the patient's current medications. Prior to Admission:  Medications Prior to Admission  Medication Sig Dispense Refill Last Dose  . amLODipine (NORVASC) 10 MG tablet Take 10 mg by mouth daily. for high blood pressure  2 03/13/2018 at Unknown time  . atorvastatin (LIPITOR) 80 MG tablet Take 1 tablet by mouth every evening.  2 03/13/2018 at Unknown time  . carvedilol (COREG) 25 MG tablet Take 1 tablet (25 mg total) by mouth 2 (two) times daily. 60 tablet 6 03/13/2018 at Unknown time  . cloNIDine (CATAPRES) 0.3 MG tablet Take 1 tablet by mouth 2 (two) times daily.  5 03/13/2018 at Unknown time  . glipiZIDE (GLUCOTROL XL) 10 MG 24 hr tablet Take 1 tablet by mouth every morning.  2 03/13/2018 at Unknown time  . LANTUS SOLOSTAR 100 UNIT/ML Solostar Pen Inject 48 Units into the skin every evening.  2 Past Week at Unknown time  . lisinopril (PRINIVIL,ZESTRIL) 20 MG tablet Take 1 tablet (20 mg  total) by mouth daily. 30 tablet 1 03/13/2018 at Unknown time  . lisinopril (PRINIVIL,ZESTRIL) 40 MG tablet Take 40 mg by mouth daily. for high blood pressure  2 03/13/2018 at Unknown time  . spironolactone (ALDACTONE) 100 MG tablet Take 100 mg by mouth daily.  5 03/13/2018 at Unknown time   Scheduled: . amiodarone  200 mg Oral BID  . carvedilol  25 mg Oral BID  . enoxaparin (LOVENOX) injection  40 mg Subcutaneous Q24H  . insulin aspart  0-9 Units Subcutaneous TID WC  . lisinopril  20 mg Oral Daily  . nicotine  21 mg Transdermal Daily  . simvastatin  20 mg Oral q1800    ROS: History obtained from  the patient  General ROS: negative for - chills, fatigue, fever, night sweats, weight gain or weight loss Psychological ROS: anxiety Ophthalmic ROS: floaters ENT ROS: negative for - epistaxis, nasal discharge, oral lesions, sore throat, tinnitus  Allergy and Immunology ROS: negative for - hives or itchy/watery eyes Hematological and Lymphatic ROS: negative for - bleeding problems, bruising or swollen lymph nodes Endocrine ROS: negative for - galactorrhea, hair pattern changes, polydipsia/polyuria or temperature intolerance Respiratory ROS: negative for - cough, hemoptysis, shortness of breath or wheezing Cardiovascular ROS: negative for - chest pain, dyspnea on exertion, edema or irregular heartbeat Gastrointestinal ROS: negative for - abdominal pain, diarrhea, hematemesis, nausea/vomiting or stool incontinence Genito-Urinary ROS: negative for - dysuria, hematuria, incontinence or urinary frequency/urgency Musculoskeletal ROS: negative for - joint swelling or muscular weakness Neurological ROS: as noted in HPI Dermatological ROS: negative for rash and skin lesion changes  Physical Examination: Blood pressure 112/71, pulse (!) 58, temperature 98.4 F (36.9 C), temperature source Oral, resp. rate 18, height 6\' 1"  (1.854 m), weight 114.2 kg (251 lb 11.2 oz), SpO2 100 %.  HEENT-  Normocephalic, no lesions, without obvious abnormality.  Normal external eye and conjunctiva.  Normal TM's bilaterally.  Normal auditory canals and external ears. Normal external nose, mucus membranes and septum.  Normal pharynx. Cardiovascular- S1, S2 normal, pulses palpable throughout   Lungs- chest clear, no wheezing, rales, normal symmetric air entry Abdomen- soft, non-tender; bowel sounds normal; no masses,  no organomegaly Extremities- no edema Lymph-no adenopathy palpable Musculoskeletal-no joint tenderness, deformity or swelling Skin-warm and dry, no hyperpigmentation, vitiligo, or suspicious  lesions  Neurological Examination   Mental Status: Alert, oriented, thought content appropriate.  Speech fluent without evidence of aphasia.  Able to follow 3 step commands without difficulty. Cranial Nerves: II: Discs flat bilaterally; Visual fields grossly normal, pupils equal, round, reactive to light and accommodation III,IV, VI: ptosis not present, extra-ocular motions intact bilaterally V,VII: smile symmetric, facial light touch sensation normal bilaterally VIII: hearing normal bilaterally IX,X: gag reflex present XI: bilateral shoulder shrug XII: midline tongue extension Motor: Right : Upper extremity   5/5    Left:     Upper extremity   5/5  Lower extremity   5/5     Lower extremity   5/5 Tone and bulk:normal tone throughout; no atrophy noted Sensory: Pinprick and light touch intact throughout, bilaterally Deep Tendon Reflexes: 2+ and symmetric throughout Plantars: Right: big toe amputation   Left: downgoing Cerebellar: Normal finger-to-nose, normal rapid alternating movements and normal heel-to-shin testing bilaterally Gait: not tested due to safety concerns   Laboratory Studies:  Basic Metabolic Panel: Recent Labs  Lab 03/13/18 2054  NA 136  K 4.8  CL 102  CO2 25  GLUCOSE 126*  BUN 39*  CREATININE 1.75*  CALCIUM 11.3*    Liver Function Tests: No results for input(s): AST, ALT, ALKPHOS, BILITOT, PROT, ALBUMIN in the last 168 hours. No results for input(s): LIPASE, AMYLASE in the last 168 hours. No results for input(s): AMMONIA in the last 168 hours.  CBC: Recent Labs  Lab 03/13/18 2054  WBC 9.4  HGB 13.2  HCT 38.3*  MCV 95.5  PLT 237    Cardiac Enzymes: Recent Labs  Lab 03/13/18 2054  TROPONINI 0.04*    BNP: Invalid input(s): POCBNP  CBG: Recent Labs  Lab 03/14/18 1142  GLUCAP 272*    Microbiology: Results for orders placed or performed during the hospital encounter of 02/29/16  Surgical pcr screen     Status: None   Collection  Time: 02/29/16  5:05 PM  Result Value Ref Range Status   MRSA, PCR NEGATIVE NEGATIVE Final   Staphylococcus aureus NEGATIVE NEGATIVE Final    Comment:        The Xpert SA Assay (FDA approved for NASAL specimens in patients over 58 years of age), is one component of a comprehensive surveillance program.  Test performance has been validated by [facility] for patients greater than or equal to 11 year old. It is not intended to diagnose infection nor to guide or monitor treatment.   CULTURE, BLOOD (ROUTINE X 2) w Reflex to ID Panel     Status: None   Collection Time: 02/29/16  5:11 PM  Result Value Ref Range Status   Specimen Description BLOOD LEFT ASSIST CONTROL  Final   Special Requests   Final    BOTTLES DRAWN AEROBIC AND ANAEROBIC  AERO 15CC ANA 12CC   Culture NO GROWTH 5 DAYS  Final   Report Status 03/05/2016 FINAL  Final  CULTURE, BLOOD (ROUTINE X 2) w Reflex to ID Panel     Status: None   Collection Time: 02/29/16  5:12 PM  Result Value Ref Range Status   Specimen Description BLOOD LEFT HAND  Final   Special Requests   Final    BOTTLES DRAWN AEROBIC AND ANAEROBIC  AERO 10CC ANA 5CC   Culture NO GROWTH 5 DAYS  Final   Report Status 03/05/2016 FINAL  Final  Aerobic/Anaerobic Culture (surgical/deep wound)     Status: None   Collection Time: 02/29/16  8:43 PM  Result Value Ref Range Status   Specimen Description BONE RIGHT TOE  Final   Special Requests AMPUTATION OF RIGHT GREAT TOE  Final   Gram Stain   Final    RARE WBC PRESENT, PREDOMINANTLY PMN RARE GRAM POSITIVE COCCI IN PAIRS    Culture   Final    MODERATE MICROAEROPHILIC STREPTOCOCCI Standardized susceptibility testing for this organism is not available. NO ANAEROBES ISOLATED Performed at [facility]    Report Status 03/07/2016 FINAL  Final    Coagulation Studies: No results for input(s): LABPROT, INR in the last 72 hours.  Urinalysis: No results for input(s): COLORURINE, LABSPEC, PHURINE, GLUCOSEU,  HGBUR, BILIRUBINUR, KETONESUR, PROTEINUR, UROBILINOGEN, NITRITE, LEUKOCYTESUR in the last 168 hours.  Invalid input(s): APPERANCEUR  Lipid Panel:    Component Value Date/Time   CHOL 106 03/14/2018 0538   CHOL 135 07/21/2013 0759   TRIG 132 03/14/2018 0538   TRIG 152 07/21/2013 0759   HDL 29 (L) 03/14/2018 0538   HDL 39 (L) 07/21/2013 0759   CHOLHDL 3.7 03/14/2018 0538   VLDL 26 03/14/2018 0538   VLDL 30 07/21/2013 0759   LDLCALC 51 03/14/2018 0538   LDLCALC 66  07/21/2013 0759    HgbA1C:  Lab Results  Component Value Date   HGBA1C 9.0 (H) 03/14/2018    Urine Drug Screen:  No results found for: LABOPIA, COCAINSCRNUR, LABBENZ, AMPHETMU, THCU, LABBARB  Alcohol Level: No results for input(s): ETH in the last 168 hours.  Other results: EKG: sinus bradycardia at 61 bpm with occasional PVC's and first degree AV block   Imaging: Ct Head Wo Contrast  Result Date: 03/13/2018 CLINICAL DATA:  Headache, blurred vision. EXAM: CT HEAD WITHOUT CONTRAST TECHNIQUE: Contiguous axial images were obtained from the base of the skull through the vertex without intravenous contrast. COMPARISON:  CT scan of October 18, 2007. FINDINGS: Brain: Mild chronic ischemic white matter disease is noted. Old right frontal infarction is noted. New subcortical white matter low density is noted in right posterior parietal cortex most consistent with infarction of indeterminate age. No mass effect or midline shift is noted. Ventricular size is within normal limits. There is no evidence of mass lesion or hemorrhage. Vascular: No hyperdense vessel or unexpected calcification. Skull: Normal. Negative for fracture or focal lesion. Sinuses/Orbits: No acute finding. Other: Fluid is noted in the mastoid air cells bilaterally. IMPRESSION: Subcortical white matter low density is noted in right posterior parietal cortex concerning for infarction of indeterminate age. MRI is recommended for further evaluation. Electronically  Signed   By: Lupita Raider, M.D.   On: 03/13/2018 21:58    Assessment: 57 y.o. male with multiple vascular risk factors and noncompliance with medications who presents hypertensive with headache and blurred vision.  Patient now improved.  Head CT reviewed and shows no acute changes.  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  Ischemia likely secondary to small vessel disease.  MRA shows 50% left ICA and mild right ICA stenosis.  Also 50% left vert stenosis.  Echocardiogram shows no cardiac source of emboli with an EF of 40-45%.  A1c 9.0, LDL 51.  Stroke Risk Factors - diabetes mellitus, hyperlipidemia, hypertension and smoking  Plan: 1. Blood sugar management with target A1c<7.0 2. PT consult, OT consult, Speech consult 3. Prophylactic therapy-Antiplatelet med: Aspirin - dose 81mg  daily 4. NPO until RN stroke swallow screen 5. Telemetry monitoring 6. Frequent neuro checks 7. Smoking cessation counseling 8. Continued BP control with compliance stressed.      Thana Farr, MD Neurology [phone] 03/14/2018, 2:42 PM

## 2018-03-14 NOTE — Progress Notes (Signed)
OT Cancellation Note  Patient Details Name: Marc [name] MRN: [id number] DOB: [birth year]   Cancelled Treatment:    Reason Eval/Treat Not Completed: OT screened, no needs identified, will sign off. Pt able to perform functional ADL transfers and mobility safely and independently with no AD and completes coordination and vision assessment with no notable deviations from normal. No OT needs perceived at this time.   Rejeana Brock, MS, OTR/L ascom [phone] or [phone] 03/14/18, 2:26 PM

## 2018-03-14 NOTE — Evaluation (Signed)
SLP Cancellation Note  Patient Details Name: Marc [name] MRN: [id number] DOB: 1961-06-07   Cancelled treatment:       Reason Eval/Treat Not Completed: SLP screened, no needs identified, will sign off  Pt states no changes with swallow or speech. SLP educated NSG and pt on ssx to monitor for and to notify slp if changes occur.    Meredith Pel Sauber 03/14/2018, 9:28 AM

## 2018-03-14 NOTE — Progress Notes (Addendum)
PT Cancellation Note  Patient Details Name: Marc [name] MRN: [id number] DOB: 1960/12/08   Cancelled Treatment:    Reason Eval/Treat Not Completed: PT screened, no needs identified, will sign off Pt in recliner on arrival and easily able to rise to standing and introduce himself to PT.  We did a quick walk around the nurses' station and went up/down 6 steps w/o issue and showed no strength deficits side to side or other coordination/neuro issues.  Pt reports some very mild residual vision issues (occasionally seeing spots). No PT needs identified, will sign off at this time.  Malachi Pro, DPT 03/14/2018, 10:35 AM

## 2018-03-14 NOTE — Progress Notes (Signed)
*  PRELIMINARY RESULTS* Echocardiogram 2D Echocardiogram has been performed.  Joanette Gula Thuy Atilano 03/14/2018, 1:07 PM

## 2018-03-14 NOTE — H&P (Signed)
[facility] [place] at [facility]   PATIENT NAME: Marc [name]    MR#:  [id number]  DATE OF BIRTH:  [birth year]  DATE OF ADMISSION:  03/13/2018  PRIMARY CARE PHYSICIAN: Patient, No Pcp Per   REQUESTING/REFERRING PHYSICIAN: Pershing Proud, MD  CHIEF COMPLAINT:   Chief Complaint  Patient presents with  . Hypertension    HISTORY OF PRESENT ILLNESS:  Marc [name]  is a 57 y.o. male who presents with a few days of profound headache, elevated blood pressure, intermittent blurring of his vision.  This occurred about 5 days ago, and lasted for 2 to 3 days intermittently.  Here in the ED today CT head shows possible parietal stroke.  Hospitalist were called for admission  PAST MEDICAL HISTORY:   Past Medical History:  Diagnosis Date  . Anxiety   . Cardiomyopathy (HCC)    45-50%  . CHF (congestive heart failure) (HCC)   . Chronic systolic heart failure (HCC) 07/2013   Ejection fraction of 45-50% with possible inferior wall hypokinesis  . Coronary artery disease    Significant left main and Severe three-vessel coronary artery disease in December of 2014. He underwent CABG in January of 2015 with LIMA to LAD, sequential SVG to ramus/OM 2 and SVG to OM 3  . Diabetes mellitus without complication (HCC)   . Dysrhythmia   . Hyperlipidemia   . Hypertension   . Medical non-compliance   . MI (myocardial infarction) (HCC)   . Obesity   . Tobacco abuse      PAST SURGICAL HISTORY:   Past Surgical History:  Procedure Laterality Date  . AMPUTATION TOE Right 02/29/2016   Procedure: AMPUTATION TOE;  Surgeon: Linus Galas, DPM;  Location: [facility] ORS;  Service: Podiatry;  Laterality: Right;  Great toe  . CARDIAC CATHETERIZATION     [facility] no stent  . CORONARY ARTERY BYPASS GRAFT N/A 09/16/2013   Procedure: CORONARY ARTERY BYPASS GRAFTING (CABG);  Surgeon: [name], MD;  Location: [facility] OR;  Service: Open Heart Surgery;  Laterality: N/A;  CABG x four, using left  internal mammary artery and right leg greater saphenous vein  . INTRAOPERATIVE TRANSESOPHAGEAL ECHOCARDIOGRAM N/A 09/16/2013   Procedure: INTRAOPERATIVE TRANSESOPHAGEAL ECHOCARDIOGRAM;  Surgeon: [name], MD;  Location: [facility] OR;  Service: Open Heart Surgery;  Laterality: N/A;  . knee surgery Right   . LASIK Bilateral      SOCIAL HISTORY:   Social History   Tobacco Use  . Smoking status: Current Every Day Smoker    Packs/day: 1.00    Years: 45.00    Pack years: 45.00    Types: Cigarettes  . Smokeless tobacco: Never Used  Substance Use Topics  . Alcohol use: No    Alcohol/week: 0.0 oz    Comment: occassional     FAMILY HISTORY:   Family History  Problem Relation Age of Onset  . Heart disease Mother      DRUG ALLERGIES:  No Known Allergies  MEDICATIONS AT HOME:   Prior to Admission medications   Medication Sig Start Date End Date Taking? Authorizing Provider  ALPRAZolam Prudy Feeler) 0.25 MG tablet Take 0.25 mg by mouth at bedtime as needed for anxiety.    [provider]  amiodarone (PACERONE) 400 MG tablet Take 0.5 tablets (200 mg total) by mouth 2 (two) times daily. 09/24/13   Gold, Wayne E, PA-C  amLODipine (NORVASC) 10 MG tablet Take 10 mg by mouth daily. for high blood pressure 01/31/18   [provider]  amoxicillin-clavulanate (AUGMENTIN) 875-125 MG tablet Take 1 tablet by mouth 2 (two) times daily. 03/02/16   Altamese Dilling, MD  atorvastatin (LIPITOR) 80 MG tablet Take 1 tablet by mouth every evening. 01/31/18   [provider]  carvedilol (COREG) 25 MG tablet Take 1 tablet (25 mg total) by mouth 2 (two) times daily. 06/26/14   Antonieta Iba, MD  cloNIDine (CATAPRES) 0.3 MG tablet Take 1 tablet by mouth 2 (two) times daily. 01/24/18   [provider]  furosemide (LASIX) 40 MG tablet Take 1 tablet (40 mg total) by mouth daily. 05/05/14   Iran Ouch, MD  glimepiride (AMARYL) 4 MG tablet Take 1 tablet (4 mg total) by  mouth daily with breakfast. 03/02/16   Altamese Dilling, MD  glipiZIDE (GLUCOTROL XL) 10 MG 24 hr tablet Take 1 tablet by mouth every morning. 01/31/18   [provider]  HYDROcodone-acetaminophen (NORCO/VICODIN) 5-325 MG tablet Take 1 tablet by mouth every 4 (four) hours as needed for moderate pain. 02/22/16   Tommi Rumps, PA-C  HYDROcodone-acetaminophen (NORCO/VICODIN) 5-325 MG tablet Take 1-2 tablets by mouth every 6 (six) hours as needed for moderate pain or severe pain. 03/02/16   Altamese Dilling, MD  insulin glargine (LANTUS) 100 UNIT/ML injection Inject 0.32 mLs (32 Units total) into the skin every morning. 03/02/16   Altamese Dilling, MD  LANTUS SOLOSTAR 100 UNIT/ML Solostar Pen Inject 48 Units into the skin every evening. 01/31/18   [provider]  lisinopril (PRINIVIL,ZESTRIL) 20 MG tablet Take 1 tablet (20 mg total) by mouth daily. 09/24/13   Gold, Wayne E, PA-C  lisinopril (PRINIVIL,ZESTRIL) 40 MG tablet Take 40 mg by mouth daily. for high blood pressure 01/31/18   [provider]  metFORMIN (GLUCOPHAGE) 1000 MG tablet Take 1 tablet (1,000 mg total) by mouth 2 (two) times daily with a meal. Patient taking differently: Take 1,000 mg by mouth 2 (two) times daily with a meal. Prescribed Metformin 1000 mg BID but patient reports he only takes once a day 07/22/13   Iran Ouch, MD  Multiple Vitamin (MULTIVITAMIN WITH MINERALS) TABS tablet Take 1 tablet by mouth daily.    [provider]  simvastatin (ZOCOR) 20 MG tablet Take 1 tablet (20 mg total) by mouth daily at 6 PM. 09/24/13   Gold, Glenice Laine, PA-C  spironolactone (ALDACTONE) 100 MG tablet Take 100 mg by mouth daily. 01/31/18   [provider]    REVIEW OF SYSTEMS:  Review of Systems  Constitutional: Negative for chills, fever, malaise/fatigue and weight loss.  HENT: Negative for ear pain, hearing loss and tinnitus.   Eyes: Positive for blurred vision. Negative for double  vision, pain and redness.  Respiratory: Negative for cough, hemoptysis and shortness of breath.   Cardiovascular: Negative for chest pain, palpitations, orthopnea and leg swelling.  Gastrointestinal: Negative for abdominal pain, constipation, diarrhea, nausea and vomiting.  Genitourinary: Negative for dysuria, frequency and hematuria.  Musculoskeletal: Negative for back pain, joint pain and neck pain.  Skin:       No acne, rash, or lesions  Neurological: Positive for headaches. Negative for dizziness, tremors, focal weakness and weakness.  Endo/Heme/Allergies: Negative for polydipsia. Does not bruise/bleed easily.  Psychiatric/Behavioral: Negative for depression. The patient is not nervous/anxious and does not have insomnia.      VITAL SIGNS:   Vitals:   03/13/18 2044 03/13/18 2230  BP: 114/70 121/70  Pulse: (!) 51 (!) 43  Resp: 18 20  Temp:  98.2 F (36.8 C)   TempSrc: Oral   SpO2: 99% 98%  Weight: 113.4 kg (250 lb)   Height: 6' (1.829 m)    Wt Readings from Last 3 Encounters:  03/13/18 113.4 kg (250 lb)  03/11/18 113.4 kg (250 lb)  03/02/16 107 kg (236 lb)    PHYSICAL EXAMINATION:  Physical Exam  Vitals reviewed. Constitutional: He is oriented to person, place, and time. He appears well-developed and well-nourished. No distress.  HENT:  Head: Normocephalic and atraumatic.  Mouth/Throat: Oropharynx is clear and moist.  Eyes: Pupils are equal, round, and reactive to light. Conjunctivae and EOM are normal. No scleral icterus.  Neck: Normal range of motion. Neck supple. No JVD present. No thyromegaly present.  Cardiovascular: Normal rate, regular rhythm and intact distal pulses. Exam reveals no gallop and no friction rub.  No murmur heard. Respiratory: Effort normal and breath sounds normal. No respiratory distress. He has no wheezes. He has no rales.  GI: Soft. Bowel sounds are normal. He exhibits no distension. There is no tenderness.  Musculoskeletal: Normal range of  motion. He exhibits no edema.  No arthritis, no gout  Lymphadenopathy:    He has no cervical adenopathy.  Neurological: He is alert and oriented to person, place, and time. No cranial nerve deficit.  Neurologic: Cranial nerves II-XII intact, Sensation intact to light touch/pinprick, 5/5 strength in all extremities, no dysarthria, no aphasia, no dysphagia, memory intact, finger to nose testing showed no abnormality, no pronator drift  Skin: Skin is warm and dry. No rash noted. No erythema.  Psychiatric: He has a normal mood and affect. His behavior is normal. Judgment and thought content normal.    LABORATORY PANEL:   CBC Recent Labs  Lab 03/13/18 2054  WBC 9.4  HGB 13.2  HCT 38.3*  PLT 237   ------------------------------------------------------------------------------------------------------------------  Chemistries  Recent Labs  Lab 03/13/18 2054  NA 136  K 4.8  CL 102  CO2 25  GLUCOSE 126*  BUN 39*  CREATININE 1.75*  CALCIUM 11.3*   ------------------------------------------------------------------------------------------------------------------  Cardiac Enzymes Recent Labs  Lab 03/13/18 2054  TROPONINI 0.04*   ------------------------------------------------------------------------------------------------------------------  RADIOLOGY:  Ct Head Wo Contrast  Result Date: 03/13/2018 CLINICAL DATA:  Headache, blurred vision. EXAM: CT HEAD WITHOUT CONTRAST TECHNIQUE: Contiguous axial images were obtained from the base of the skull through the vertex without intravenous contrast. COMPARISON:  CT scan of October 18, 2007. FINDINGS: Brain: Mild chronic ischemic white matter disease is noted. Old right frontal infarction is noted. New subcortical white matter low density is noted in right posterior parietal cortex most consistent with infarction of indeterminate age. No mass effect or midline shift is noted. Ventricular size is within normal limits. There is no evidence  of mass lesion or hemorrhage. Vascular: No hyperdense vessel or unexpected calcification. Skull: Normal. Negative for fracture or focal lesion. Sinuses/Orbits: No acute finding. Other: Fluid is noted in the mastoid air cells bilaterally. IMPRESSION: Subcortical white matter low density is noted in right posterior parietal cortex concerning for infarction of indeterminate age. MRI is recommended for further evaluation. Electronically Signed   By: Lupita Raider, M.D.   On: 03/13/2018 21:58    EKG:   Orders placed or performed during the hospital encounter of 03/13/18  . ED EKG within 10 minutes  . ED EKG within 10 minutes    IMPRESSION AND PLAN:  Principal Problem:   Blurred vision -strong suspicion for stroke based on CT head.  We will admit him stroke  per stroke admission order set with appropriate imaging, labs, consults Active Problems:   Chronic systolic heart failure (HCC) -continue home meds   Hypertension -continue home medications   PAF (paroxysmal atrial fibrillation) (HCC) -home dose rate controlling meds and anticoagulation   CAD (coronary artery disease) -continue home meds   Diabetes (HCC) -sliding scale insulin with corresponding glucose checks   Hyperlipidemia -Home dose antilipid  Chart review performed and case discussed with ED provider. Labs, imaging and/or ECG reviewed by provider and discussed with patient/family. Management plans discussed with the patient and/or family.  DVT PROPHYLAXIS: Systemic anticoagulation  GI PROPHYLAXIS: None  ADMISSION STATUS: Observation  CODE STATUS: Full Code Status History    Date Active Date Inactive Code Status Order ID Comments User Context   02/29/2016 1611 03/02/2016 1724 Full Code [id number]  Milagros Loll, MD Inpatient   09/18/2013 0824 09/24/2013 1759 Full Code [id number]  [name], MD Inpatient   09/16/2013 1322 09/18/2013 0824 Full Code [id number]  Ardelle Balls, PA-C Inpatient      TOTAL TIME TAKING CARE  OF THIS PATIENT: 40 minutes.   Lomax Poehler FIELDING 03/14/2018, 12:14 AM  [facility]  Office  [phone]  CC: Primary care physician; Patient, No Pcp Per  Note:  This document was prepared using Dragon voice recognition software and may include unintentional dictation errors.

## 2018-03-14 NOTE — Plan of Care (Signed)
  Problem: Safety: Goal: Ability to remain free from injury will improve Outcome: Progressing  Moderate Fall Risk, pt independent; ambulated in hallway and room this shift

## 2018-03-14 NOTE — Progress Notes (Signed)
OT Cancellation Note  Patient Details Name: Marc [name] MRN: [id number] DOB: 08-Dec-1960   Cancelled Treatment:    Reason Eval/Treat Not Completed: Patient at procedure or test/ unavailable Pt receiving Echo on initial attempt, will re-attempt at later date/time as pt is available and medically appropriate.  Rejeana Brock, MS, OTR/L ascom [phone] or [phone] 03/14/18, 12:14 PM

## 2018-03-15 DIAGNOSIS — Z89421 Acquired absence of other right toe(s): Secondary | ICD-10-CM | POA: Diagnosis not present

## 2018-03-15 DIAGNOSIS — I5022 Chronic systolic (congestive) heart failure: Secondary | ICD-10-CM | POA: Diagnosis present

## 2018-03-15 DIAGNOSIS — Z9114 Patient's other noncompliance with medication regimen: Secondary | ICD-10-CM | POA: Diagnosis not present

## 2018-03-15 DIAGNOSIS — I480 Paroxysmal atrial fibrillation: Secondary | ICD-10-CM | POA: Diagnosis present

## 2018-03-15 DIAGNOSIS — E669 Obesity, unspecified: Secondary | ICD-10-CM | POA: Diagnosis present

## 2018-03-15 DIAGNOSIS — I2510 Atherosclerotic heart disease of native coronary artery without angina pectoris: Secondary | ICD-10-CM | POA: Diagnosis present

## 2018-03-15 DIAGNOSIS — I6523 Occlusion and stenosis of bilateral carotid arteries: Secondary | ICD-10-CM | POA: Diagnosis present

## 2018-03-15 DIAGNOSIS — Z6833 Body mass index (BMI) 33.0-33.9, adult: Secondary | ICD-10-CM | POA: Diagnosis not present

## 2018-03-15 DIAGNOSIS — Z79899 Other long term (current) drug therapy: Secondary | ICD-10-CM | POA: Diagnosis not present

## 2018-03-15 DIAGNOSIS — I639 Cerebral infarction, unspecified: Secondary | ICD-10-CM | POA: Diagnosis not present

## 2018-03-15 DIAGNOSIS — R29701 NIHSS score 1: Secondary | ICD-10-CM | POA: Diagnosis present

## 2018-03-15 DIAGNOSIS — F17210 Nicotine dependence, cigarettes, uncomplicated: Secondary | ICD-10-CM | POA: Diagnosis present

## 2018-03-15 DIAGNOSIS — I252 Old myocardial infarction: Secondary | ICD-10-CM | POA: Diagnosis not present

## 2018-03-15 DIAGNOSIS — E1165 Type 2 diabetes mellitus with hyperglycemia: Secondary | ICD-10-CM | POA: Diagnosis present

## 2018-03-15 DIAGNOSIS — I429 Cardiomyopathy, unspecified: Secondary | ICD-10-CM | POA: Diagnosis present

## 2018-03-15 DIAGNOSIS — Z7984 Long term (current) use of oral hypoglycemic drugs: Secondary | ICD-10-CM | POA: Diagnosis not present

## 2018-03-15 DIAGNOSIS — E785 Hyperlipidemia, unspecified: Secondary | ICD-10-CM | POA: Diagnosis present

## 2018-03-15 DIAGNOSIS — I63531 Cerebral infarction due to unspecified occlusion or stenosis of right posterior cerebral artery: Secondary | ICD-10-CM | POA: Diagnosis not present

## 2018-03-15 DIAGNOSIS — F419 Anxiety disorder, unspecified: Secondary | ICD-10-CM | POA: Diagnosis present

## 2018-03-15 DIAGNOSIS — Z951 Presence of aortocoronary bypass graft: Secondary | ICD-10-CM | POA: Diagnosis not present

## 2018-03-15 DIAGNOSIS — I110 Hypertensive heart disease with heart failure: Secondary | ICD-10-CM | POA: Diagnosis present

## 2018-03-15 LAB — HIV ANTIBODY (ROUTINE TESTING W REFLEX): HIV Screen 4th Generation wRfx: NONREACTIVE

## 2018-03-15 LAB — GLUCOSE, CAPILLARY: GLUCOSE-CAPILLARY: 232 mg/dL — AB (ref 70–99)

## 2018-03-15 MED ORDER — LISINOPRIL 20 MG PO TABS: 20.0000 mg | ORAL_TABLET | Freq: Every day | ORAL | 1 refills | Status: DC

## 2018-03-15 MED ORDER — ASPIRIN EC 81 MG PO TBEC: 81.0000 mg | DELAYED_RELEASE_TABLET | Freq: Every day | ORAL | 2 refills | Status: AC

## 2018-03-15 MED ORDER — LANTUS SOLOSTAR 100 UNIT/ML ~~LOC~~ SOPN: 52.0000 [IU] | PEN_INJECTOR | Freq: Every evening | SUBCUTANEOUS | 2 refills | Status: DC

## 2018-03-15 MED ORDER — ROSUVASTATIN CALCIUM 10 MG PO TABS: 10.0000 mg | ORAL_TABLET | Freq: Every day | ORAL | 1 refills | Status: DC

## 2018-03-15 MED ORDER — SIMVASTATIN 20 MG PO TABS: 20.0000 mg | ORAL_TABLET | Freq: Every day | ORAL | 1 refills | Status: DC

## 2018-03-15 MED ORDER — AMIODARONE HCL 400 MG PO TABS: 200.0000 mg | ORAL_TABLET | Freq: Two times a day (BID) | ORAL | 1 refills | Status: DC

## 2018-03-15 MED ORDER — LISINOPRIL 20 MG PO TABS: 20.0000 mg | ORAL_TABLET | Freq: Every day | ORAL | 11 refills | Status: DC

## 2018-03-15 NOTE — Progress Notes (Signed)
Discharge instructions given and went over with patient at bedside. Prescriptions given and reviewed. All questions answered. Patient discharged home. Amed Datta S, RN  

## 2018-03-15 NOTE — Discharge Summary (Signed)
Marc [name], is a 57 y.o. male  DOB Jun 19, 1961  MRN [id number].  Admission date:  03/13/2018  Admitting Physician  Oralia Manis, MD  Discharge Date:  03/15/2018   Primary MD  Patient, No Pcp Per  Recommendations for primary care physician for things to follow:   [facility].   Admission Diagnosis  Cerebrovascular accident (CVA), unspecified mechanism (HCC) [I63.9]   Discharge Diagnosis  Cerebrovascular accident (CVA), unspecified mechanism (HCC) [I63.9]    Principal Problem:   Blurred vision Active Problems:   Chronic systolic heart failure (HCC)   Hypertension   Hyperlipidemia   PAF (paroxysmal atrial fibrillation) (HCC)   CAD (coronary artery disease)   Diabetes (HCC)      Past Medical History:  Diagnosis Date  . Anxiety   . Cardiomyopathy (HCC)    45-50%  . CHF (congestive heart failure) (HCC)   . Chronic systolic heart failure (HCC) 07/2013   Ejection fraction of 45-50% with possible inferior wall hypokinesis  . Coronary artery disease    Significant left main and Severe three-vessel coronary artery disease in December of 2014. He underwent CABG in January of 2015 with LIMA to LAD, sequential SVG to ramus/OM 2 and SVG to OM 3  . Diabetes mellitus without complication (HCC)   . Dysrhythmia   . Hyperlipidemia   . Hypertension   . Medical non-compliance   . MI (myocardial infarction) (HCC)   . Obesity   . Tobacco abuse     Past Surgical History:  Procedure Laterality Date  . AMPUTATION TOE Right 02/29/2016   Procedure: AMPUTATION TOE;  Surgeon: Linus Galas, DPM;  Location: [facility] ORS;  Service: Podiatry;  Laterality: Right;  Great toe  . CARDIAC CATHETERIZATION     [facility] no stent  . CORONARY ARTERY BYPASS GRAFT N/A 09/16/2013   Procedure:  CORONARY ARTERY BYPASS GRAFTING (CABG);  Surgeon: [name], MD;  Location: [facility] OR;  Service: Open Heart Surgery;  Laterality: N/A;  CABG x four, using left internal mammary artery and right leg greater saphenous vein  . INTRAOPERATIVE TRANSESOPHAGEAL ECHOCARDIOGRAM N/A 09/16/2013   Procedure: INTRAOPERATIVE TRANSESOPHAGEAL ECHOCARDIOGRAM;  Surgeon: [name], MD;  Location: [facility] OR;  Service: Open Heart Surgery;  Laterality: N/A;  . knee surgery Right   . LASIK Bilateral        History of present illness and  Hospital Course:     Kindly see H&P for history of present illness and admission details, please review complete Labs, Consult reports and Test reports for all details in brief  HPI  from the history and physical done on the day of admission 57 year old  male patient admitted for headache, blurred vision, admitted for evaluation of acute stroke. Initial NIH stroke scale of one. Patient not taking aspirin as prescribed. Not have any weakness of hands or legs. No speech troubles.  Hospital Course  acute right posterior MCA stroke: patient is admitted to stroke unit, started on aspirin, statins, had MRI of the brain, ultrasound of cultures, echocardiogram. Patient is seen by neurologist Dr. Thad Ranger. Patient initial CT head did not show acute  changes but MRI of the brain showed chronic right MCA, left cerebellar infarct along with subacute right posterior MCA infarct with peticheal hemorrhages. Ischemia secondary to small vessel disease from diabetes, hypertension. Patient MRI of the brain showed 50% left ICA, mid RC ICS stenosis. Patient also had 50% left about Roger stenosis. Echocardiogram showed (40 to  45% with no cardiac/family. Hemoglobin A1c is nine, LDL 51. Patient is advised to restart the aspirin back at 81 mg daily, continue Lipitor 80 mg PO daily, [facility], medication management for continued treatment. Patient is at high risk for recurrent stroke  secondary to his underlying essential hypertension, poorly controlled diabetes mellitus, ongoing tobacco abuse. Discussed with the patient that he needs to quit smoking. Physical therapy, occupational therapy, patient did not have any PT,OT the needs  2. Chronic systolic heart failure with the effort for person: patient already on Coreg 25 mg PO BID, lisinopril 40 mg PO daily, patient can continue them.. Patient told me that he is not taking amiodarone that's listed in medicine list.   3.diabetes mellitus type II: uncontrolled, hemoglobin A1c is nine, patient is on Lantus 48 units atnight, glipizide XL 10 mg daily, he had blood sugars have been more than 200, increase the dose of Lantus to 52 units /  Discharge Condition: stable   Follow UP  Follow-up Information    [facility]. Schedule an appointment as soon as possible for a visit in 1 week(s).             Discharge Instructions  and  Discharge Medications      Allergies as of 03/15/2018   No Known Allergies     Medication List    STOP taking these medications   amiodarone 400 MG tablet Commonly known as:  PACERONE   simvastatin 20 MG tablet Commonly known as:  ZOCOR     TAKE these medications   amLODipine 10 MG tablet Commonly known as:  NORVASC Take 10 mg by mouth daily. for high blood pressure   aspirin EC 81 MG tablet Take 1 tablet (81 mg total) by mouth daily.   atorvastatin 80 MG tablet Commonly known as:  LIPITOR Take 1 tablet by mouth every evening.   carvedilol 25 MG tablet Commonly known as:  COREG Take 1 tablet (25 mg total) by mouth 2 (two) times daily.   cloNIDine 0.3 MG tablet Commonly known as:  CATAPRES Take 1 tablet by mouth 2 (two) times daily.   glipiZIDE 10 MG 24 hr tablet Commonly known as:  GLUCOTROL XL Take 1 tablet by mouth every morning.   LANTUS SOLOSTAR 100 UNIT/ML Solostar Pen Generic drug:  Insulin Glargine Inject 52 Units into the skin every evening. What changed:   how much to take   lisinopril 40 MG tablet Commonly known as:  PRINIVIL,ZESTRIL Take 40 mg by mouth daily. for high blood pressure What changed:  Another medication with the same name was removed. Continue taking this medication, and follow the directions you see here.   spironolactone 100 MG tablet Commonly known as:  ALDACTONE Take 100 mg by mouth daily.         Diet and Activity recommendation: See Discharge Instructions above   Consults obtained - neurology, physical therapy, occupational therapy   Major procedures and Radiology Reports - PLEASE review detailed and final reports for all details, in brief -      Ct Head Wo Contrast  Result Date: 03/13/2018 CLINICAL DATA:  Headache, blurred vision. EXAM: CT HEAD WITHOUT CONTRAST TECHNIQUE: Contiguous axial images were obtained from the base of the skull through the vertex without intravenous contrast. COMPARISON:  CT scan of October 18, 2007. FINDINGS: Brain: Mild chronic ischemic white matter disease is noted. Old right frontal infarction is noted. New subcortical white matter low density is noted in right posterior  parietal cortex most consistent with infarction of indeterminate age. No mass effect or midline shift is noted. Ventricular size is within normal limits. There is no evidence of mass lesion or hemorrhage. Vascular: No hyperdense vessel or unexpected calcification. Skull: Normal. Negative for fracture or focal lesion. Sinuses/Orbits: No acute finding. Other: Fluid is noted in the mastoid air cells bilaterally. IMPRESSION: Subcortical white matter low density is noted in right posterior parietal cortex concerning for infarction of indeterminate age. MRI is recommended for further evaluation. Electronically Signed   By: Lupita Raider, M.D.   On: 03/13/2018 21:58   Mr Maxine Glenn Neck W Wo Contrast  Result Date: 03/14/2018 CLINICAL DATA:  Initial evaluation for acute headache, blurry vision. EXAM: MRI HEAD WITHOUT CONTRAST MRA  HEAD WITHOUT CONTRAST MRA NECK WITHOUT AND WITH CONTRAST TECHNIQUE: Multiplanar, multiecho pulse sequences of the brain and surrounding structures were obtained without intravenous contrast. Angiographic images of the Circle of Willis were obtained using MRA technique without intravenous contrast. Angiographic images of the neck were obtained using MRA technique without and with intravenous contrast. Carotid stenosis measurements (when applicable) are obtained utilizing NASCET criteria, using the distal internal carotid diameter as the denominator. CONTRAST:  10mL MULTIHANCE GADOBENATE DIMEGLUMINE 529 MG/ML IV SOLN COMPARISON:  Prior head CT from 03/13/2018. FINDINGS: MRI HEAD FINDINGS Generalized age appropriate atrophy. Mild patchy cerebral white matter changes, nonspecific, but most commonly related to chronic small vessel ischemic disease, mild for age. Encephalomalacia with gliosis within the anterior right frontal lobe consistent with remote right ACA territory infarct. Small remote left cerebellar infarct noted as well. T2/FLAIR signal abnormality with mild diffusion abnormality involving the right temporal occipital region consistent with subacute posterior right MCA territory infarct. Area of infarction predominantly cortical in nature and measures 5.2 x 2.2 cm. Associated petechial hemorrhage evident on gradient echo sequence without frank hemorrhagic transformation. Mild localized edema without significant regional mass effect. No mass lesion, midline shift or mass effect. No hydrocephalus. No extra-axial fluid collection. Incidental note made of an empty sella. Major intracranial vascular flow voids maintained. Craniocervical junction normal. Bone marrow signal intensity within normal limits. No scalp soft tissue abnormality. Globes and orbital soft tissues within normal limits. Paranasal sinuses are largely clear. Bilateral mastoid effusions noted, suspected to be benign/sterile. MRA HEAD FINDINGS  ANTERIOR CIRCULATION: Distal cervical segments of the internal carotid arteries symmetric with antegrade flow. Petrous segments widely patent bilaterally. Atheromatous irregularity within the cavernous/supraclinoid ICAs without hemodynamically significant stenosis. Origin of the ophthalmic arteries patent. ICA termini widely patent. A1 segments patent bilaterally. Normal anterior communicating artery. Visualized anterior cerebral arteries demonstrate mild atheromatous irregularity but are patent to their distal aspects without hemodynamically significant stenosis. M1 segments demonstrated scattered atheromatous irregularity but are patent without hemodynamically significant stenosis. Normal MCA bifurcations. Distal MCA branches well perfused and symmetric, although demonstrate scattered atheromatous irregularity. POSTERIOR CIRCULATION: Vertebral arteries patent to the vertebrobasilar junction without appreciable stenosis. Left vertebral artery dominant. Partially visualized posterior inferior cerebral arteries patent proximally. Basilar patent to its distal aspect without stenosis. Superior cerebellar arteries patent proximally. Left PCA primarily supplied via the basilar. Right PCA supplied via the basilar as well as a prominent right posterior communicating artery. Atheromatous irregularity throughout the PCAs bilaterally, right greater than left without proximal high-grade stenosis. No intracranial aneurysm. MRA NECK FINDINGS Precontrast time-of-flight imaging through the neck demonstrates antegrade and patent flow within the carotid and vertebral arteries bilaterally. Postcontrast sequences demonstrate normal caliber and appearance of the aortic arch. No made of a  bovine or tree with common origin of the right brachiocephalic and left common carotid artery. No hemodynamically significant stenosis about the origin of the great vessels. Visualized subclavian arteries patent without stenosis. Right common carotid  artery widely patent from its origin to the bifurcation without stenosis atheromatous irregularity at the proximal right ICA without hemodynamically significant stenosis. Right ICA widely patent distally to the circle-of-Willis. Proximal and mid left common carotid artery widely patent. There is moderate stenosis of the distal left common carotid artery just prior to the bifurcation, measuring up to approximately 50% in diameter. Area of stenosis measures approximately 14 mm in length (series 109, image 17). Additional atheromatous stenosis at the proximal left ICA measuring up to approximately 50% as well (series 109, image 6). Stenosis begins at the bifurcation in is relatively short measuring no more than 3 mm in length. Left ICA patent distally to the skull base without stenosis. Both of the vertebral arteries arise from the subclavian arteries. Left vertebral artery dominant. Focal moderate stenosis at the origin of the pre foraminal left V1 segment (series 26, image 34). Vertebral arteries otherwise patent within the neck without stenosis or occlusion. IMPRESSION: MRI HEAD IMPRESSION: 1. Subacute right posterior MCA territory infarct involving the right temporal occipital region. Associated petechial hemorrhage without frank hemorrhagic transformation. 2. 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 MRA HEAD IMPRESSION: 1. Negative intracranial MRA with no large vessel occlusion identified. 2. Mild diffuse intracranial atherosclerotic change involving both the anterior and posterior circulations. No proximal high-grade or correctable stenosis identified. MRA NECK IMPRESSION: 1. Moderate 50% atheromatous stenoses involving the distal left common carotid and proximal left internal carotid arteries as above. 2. Mild atheromatous irregularity at the proximal right ICA without significant stenosis. 3. 50% stenosis at the origin of the dominant left vertebral artery. Vertebral arteries otherwise widely patent  within the neck. Electronically Signed   By: Rise Mu M.D.   On: 03/14/2018 16:44   Mr Brain Wo Contrast  Result Date: 03/14/2018 CLINICAL DATA:  Initial evaluation for acute headache, blurry vision. EXAM: MRI HEAD WITHOUT CONTRAST MRA HEAD WITHOUT CONTRAST MRA NECK WITHOUT AND WITH CONTRAST TECHNIQUE: Multiplanar, multiecho pulse sequences of the brain and surrounding structures were obtained without intravenous contrast. Angiographic images of the Circle of Willis were obtained using MRA technique without intravenous contrast. Angiographic images of the neck were obtained using MRA technique without and with intravenous contrast. Carotid stenosis measurements (when applicable) are obtained utilizing NASCET criteria, using the distal internal carotid diameter as the denominator. CONTRAST:  10mL MULTIHANCE GADOBENATE DIMEGLUMINE 529 MG/ML IV SOLN COMPARISON:  Prior head CT from 03/13/2018. FINDINGS: MRI HEAD FINDINGS Generalized age appropriate atrophy. Mild patchy cerebral white matter changes, nonspecific, but most commonly related to chronic small vessel ischemic disease, mild for age. Encephalomalacia with gliosis within the anterior right frontal lobe consistent with remote right ACA territory infarct. Small remote left cerebellar infarct noted as well. T2/FLAIR signal abnormality with mild diffusion abnormality involving the right temporal occipital region consistent with subacute posterior right MCA territory infarct. Area of infarction predominantly cortical in nature and measures 5.2 x 2.2 cm. Associated petechial hemorrhage evident on gradient echo sequence without frank hemorrhagic transformation. Mild localized edema without significant regional mass effect. No mass lesion, midline shift or mass effect. No hydrocephalus. No extra-axial fluid collection. Incidental note made of an empty sella. Major intracranial vascular flow voids maintained. Craniocervical junction normal. Bone marrow  signal intensity within normal limits. No scalp soft tissue abnormality. Globes  and orbital soft tissues within normal limits. Paranasal sinuses are largely clear. Bilateral mastoid effusions noted, suspected to be benign/sterile. MRA HEAD FINDINGS ANTERIOR CIRCULATION: Distal cervical segments of the internal carotid arteries symmetric with antegrade flow. Petrous segments widely patent bilaterally. Atheromatous irregularity within the cavernous/supraclinoid ICAs without hemodynamically significant stenosis. Origin of the ophthalmic arteries patent. ICA termini widely patent. A1 segments patent bilaterally. Normal anterior communicating artery. Visualized anterior cerebral arteries demonstrate mild atheromatous irregularity but are patent to their distal aspects without hemodynamically significant stenosis. M1 segments demonstrated scattered atheromatous irregularity but are patent without hemodynamically significant stenosis. Normal MCA bifurcations. Distal MCA branches well perfused and symmetric, although demonstrate scattered atheromatous irregularity. POSTERIOR CIRCULATION: Vertebral arteries patent to the vertebrobasilar junction without appreciable stenosis. Left vertebral artery dominant. Partially visualized posterior inferior cerebral arteries patent proximally. Basilar patent to its distal aspect without stenosis. Superior cerebellar arteries patent proximally. Left PCA primarily supplied via the basilar. Right PCA supplied via the basilar as well as a prominent right posterior communicating artery. Atheromatous irregularity throughout the PCAs bilaterally, right greater than left without proximal high-grade stenosis. No intracranial aneurysm. MRA NECK FINDINGS Precontrast time-of-flight imaging through the neck demonstrates antegrade and patent flow within the carotid and vertebral arteries bilaterally. Postcontrast sequences demonstrate normal caliber and appearance of the aortic arch. No made of a  bovine or tree with common origin of the right brachiocephalic and left common carotid artery. No hemodynamically significant stenosis about the origin of the great vessels. Visualized subclavian arteries patent without stenosis. Right common carotid artery widely patent from its origin to the bifurcation without stenosis atheromatous irregularity at the proximal right ICA without hemodynamically significant stenosis. Right ICA widely patent distally to the circle-of-Willis. Proximal and mid left common carotid artery widely patent. There is moderate stenosis of the distal left common carotid artery just prior to the bifurcation, measuring up to approximately 50% in diameter. Area of stenosis measures approximately 14 mm in length (series 109, image 17). Additional atheromatous stenosis at the proximal left ICA measuring up to approximately 50% as well (series 109, image 6). Stenosis begins at the bifurcation in is relatively short measuring no more than 3 mm in length. Left ICA patent distally to the skull base without stenosis. Both of the vertebral arteries arise from the subclavian arteries. Left vertebral artery dominant. Focal moderate stenosis at the origin of the pre foraminal left V1 segment (series 26, image 34). Vertebral arteries otherwise patent within the neck without stenosis or occlusion. IMPRESSION: MRI HEAD IMPRESSION: 1. Subacute right posterior MCA territory infarct involving the right temporal occipital region. Associated petechial hemorrhage without frank hemorrhagic transformation. 2. 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 MRA HEAD IMPRESSION: 1. Negative intracranial MRA with no large vessel occlusion identified. 2. Mild diffuse intracranial atherosclerotic change involving both the anterior and posterior circulations. No proximal high-grade or correctable stenosis identified. MRA NECK IMPRESSION: 1. Moderate 50% atheromatous stenoses involving the distal left common carotid and  proximal left internal carotid arteries as above. 2. Mild atheromatous irregularity at the proximal right ICA without significant stenosis. 3. 50% stenosis at the origin of the dominant left vertebral artery. Vertebral arteries otherwise widely patent within the neck. Electronically Signed   By: Rise Mu M.D.   On: 03/14/2018 16:44   Mr Maxine Glenn Head/brain GO Cm  Result Date: 03/14/2018 CLINICAL DATA:  Initial evaluation for acute headache, blurry vision. EXAM: MRI HEAD WITHOUT CONTRAST MRA HEAD WITHOUT CONTRAST MRA NECK WITHOUT AND WITH CONTRAST TECHNIQUE: Multiplanar,  multiecho pulse sequences of the brain and surrounding structures were obtained without intravenous contrast. Angiographic images of the Circle of Willis were obtained using MRA technique without intravenous contrast. Angiographic images of the neck were obtained using MRA technique without and with intravenous contrast. Carotid stenosis measurements (when applicable) are obtained utilizing NASCET criteria, using the distal internal carotid diameter as the denominator. CONTRAST:  10mL MULTIHANCE GADOBENATE DIMEGLUMINE 529 MG/ML IV SOLN COMPARISON:  Prior head CT from 03/13/2018. FINDINGS: MRI HEAD FINDINGS Generalized age appropriate atrophy. Mild patchy cerebral white matter changes, nonspecific, but most commonly related to chronic small vessel ischemic disease, mild for age. Encephalomalacia with gliosis within the anterior right frontal lobe consistent with remote right ACA territory infarct. Small remote left cerebellar infarct noted as well. T2/FLAIR signal abnormality with mild diffusion abnormality involving the right temporal occipital region consistent with subacute posterior right MCA territory infarct. Area of infarction predominantly cortical in nature and measures 5.2 x 2.2 cm. Associated petechial hemorrhage evident on gradient echo sequence without frank hemorrhagic transformation. Mild localized edema without significant  regional mass effect. No mass lesion, midline shift or mass effect. No hydrocephalus. No extra-axial fluid collection. Incidental note made of an empty sella. Major intracranial vascular flow voids maintained. Craniocervical junction normal. Bone marrow signal intensity within normal limits. No scalp soft tissue abnormality. Globes and orbital soft tissues within normal limits. Paranasal sinuses are largely clear. Bilateral mastoid effusions noted, suspected to be benign/sterile. MRA HEAD FINDINGS ANTERIOR CIRCULATION: Distal cervical segments of the internal carotid arteries symmetric with antegrade flow. Petrous segments widely patent bilaterally. Atheromatous irregularity within the cavernous/supraclinoid ICAs without hemodynamically significant stenosis. Origin of the ophthalmic arteries patent. ICA termini widely patent. A1 segments patent bilaterally. Normal anterior communicating artery. Visualized anterior cerebral arteries demonstrate mild atheromatous irregularity but are patent to their distal aspects without hemodynamically significant stenosis. M1 segments demonstrated scattered atheromatous irregularity but are patent without hemodynamically significant stenosis. Normal MCA bifurcations. Distal MCA branches well perfused and symmetric, although demonstrate scattered atheromatous irregularity. POSTERIOR CIRCULATION: Vertebral arteries patent to the vertebrobasilar junction without appreciable stenosis. Left vertebral artery dominant. Partially visualized posterior inferior cerebral arteries patent proximally. Basilar patent to its distal aspect without stenosis. Superior cerebellar arteries patent proximally. Left PCA primarily supplied via the basilar. Right PCA supplied via the basilar as well as a prominent right posterior communicating artery. Atheromatous irregularity throughout the PCAs bilaterally, right greater than left without proximal high-grade stenosis. No intracranial aneurysm. MRA NECK  FINDINGS Precontrast time-of-flight imaging through the neck demonstrates antegrade and patent flow within the carotid and vertebral arteries bilaterally. Postcontrast sequences demonstrate normal caliber and appearance of the aortic arch. No made of a bovine or tree with common origin of the right brachiocephalic and left common carotid artery. No hemodynamically significant stenosis about the origin of the great vessels. Visualized subclavian arteries patent without stenosis. Right common carotid artery widely patent from its origin to the bifurcation without stenosis atheromatous irregularity at the proximal right ICA without hemodynamically significant stenosis. Right ICA widely patent distally to the circle-of-Willis. Proximal and mid left common carotid artery widely patent. There is moderate stenosis of the distal left common carotid artery just prior to the bifurcation, measuring up to approximately 50% in diameter. Area of stenosis measures approximately 14 mm in length (series 109, image 17). Additional atheromatous stenosis at the proximal left ICA measuring up to approximately 50% as well (series 109, image 6). Stenosis begins at the bifurcation in is relatively short measuring no  more than 3 mm in length. Left ICA patent distally to the skull base without stenosis. Both of the vertebral arteries arise from the subclavian arteries. Left vertebral artery dominant. Focal moderate stenosis at the origin of the pre foraminal left V1 segment (series 26, image 34). Vertebral arteries otherwise patent within the neck without stenosis or occlusion. IMPRESSION: MRI HEAD IMPRESSION: 1. Subacute right posterior MCA territory infarct involving the right temporal occipital region. Associated petechial hemorrhage without frank hemorrhagic transformation. 2. 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 MRA HEAD IMPRESSION: 1. Negative intracranial MRA with no large vessel occlusion identified. 2. Mild diffuse  intracranial atherosclerotic change involving both the anterior and posterior circulations. No proximal high-grade or correctable stenosis identified. MRA NECK IMPRESSION: 1. Moderate 50% atheromatous stenoses involving the distal left common carotid and proximal left internal carotid arteries as above. 2. Mild atheromatous irregularity at the proximal right ICA without significant stenosis. 3. 50% stenosis at the origin of the dominant left vertebral artery. Vertebral arteries otherwise widely patent within the neck. Electronically Signed   By: Rise Mu M.D.   On: 03/14/2018 16:44    Micro Results    No results found for this or any previous visit (from the past 240 hour(s)).     Today   Subjective:   Marc [name] today has no headache,no chest abdominal pain,no new weakness tingling or numbness, feels much better wants to go home today.   Objective:   Blood pressure 125/68, pulse 64, temperature 98.3 F (36.8 C), temperature source Oral, resp. rate 18, height 6\' 1"  (1.854 m), weight 114.2 kg (251 lb 11.2 oz), SpO2 96 %.   Intake/Output Summary (Last 24 hours) at 03/15/2018 0800 Last data filed at 03/14/2018 1915 Gross per 24 hour  Intake 720 ml  Output -  Net 720 ml    Exam Awake Alert, Oriented x 3, No new F.N deficits, Normal affect .AT,PERRAL Supple Neck,No JVD, No cervical lymphadenopathy appriciated.  Symmetrical Chest wall movement, Good air movement bilaterally, CTAB RRR,No Gallops,Rubs or new Murmurs, No Parasternal Heave +ve B.Sounds, Abd Soft, Non tender, No organomegaly appriciated, No rebound -guarding or rigidity. No Cyanosis, Clubbing or edema, No new Rash or bruise  Data Review   CBC w Diff:  Lab Results  Component Value Date   WBC 9.4 03/13/2018   HGB 13.2 03/13/2018   HGB 18.7 (H) 09/18/2014   HCT 38.3 (L) 03/13/2018   HCT 56.1 (H) 09/18/2014   PLT 237 03/13/2018   PLT 268 09/18/2014   LYMPHOPCT 26 03/02/2016   LYMPHOPCT 6.0  09/18/2014   MONOPCT 7 03/02/2016   MONOPCT 4.7 09/18/2014   EOSPCT 2 03/02/2016   EOSPCT 0.4 09/18/2014   BASOPCT 1 03/02/2016   BASOPCT 0.2 09/18/2014    CMP:  Lab Results  Component Value Date   NA 136 03/13/2018   NA 134 (L) 09/18/2014   K 4.8 03/13/2018   K 4.0 09/18/2014   CL 102 03/13/2018   CL 102 09/18/2014   CO2 25 03/13/2018   CO2 24 09/18/2014   BUN 39 (H) 03/13/2018   BUN 18 09/18/2014   CREATININE 1.75 (H) 03/13/2018   CREATININE 1.23 09/18/2014   PROT 8.5 (H) 02/29/2016   PROT 6.0 (L) 09/18/2014   ALBUMIN 4.2 02/29/2016   ALBUMIN 2.9 (L) 09/18/2014   BILITOT 0.4 02/29/2016   BILITOT 0.6 09/18/2014   ALKPHOS 77 02/29/2016   ALKPHOS 74 09/18/2014   AST 39 02/29/2016   AST 15 09/18/2014  ALT 89 (H) 02/29/2016   ALT 24 09/18/2014  .   Total Time in preparing paper work, data evaluation and todays exam - 35 minutes  Katha Hamming M.D on 03/15/2018 at 8:00 AM    Note: This dictation was prepared with Dragon dictation along with smaller phrase technology. Any transcriptional errors that result from this process are unintentional.

## 2018-03-15 NOTE — Plan of Care (Signed)
  Problem: Education: Goal: Knowledge of disease or condition will improve Outcome: Progressing Goal: Knowledge of secondary prevention will improve Outcome: Progressing   Problem: Coping: Goal: Will verbalize positive feelings about self Outcome: Progressing   Problem: Pain Managment: Goal: General experience of comfort will improve Outcome: Progressing   Problem: Safety: Goal: Ability to remain free from injury will improve Outcome: Progressing

## 2020-04-05 ENCOUNTER — Inpatient Hospital Stay
Admission: EM | Admit: 2020-04-05 | Discharge: 2020-04-10 | DRG: 253 | Disposition: A | Discharge status: Home / Self Care | Payer: Medicaid Other | Attending: Internal Medicine | Admitting: Internal Medicine

## 2020-04-05 ENCOUNTER — Emergency Department: Payer: Medicaid Other

## 2020-04-05 ENCOUNTER — Other Ambulatory Visit: Payer: Self-pay

## 2020-04-05 ENCOUNTER — Other Ambulatory Visit
Admission: RE | Admit: 2020-04-05 | Discharge: 2020-04-05 | Disposition: A | Discharge status: Home / Self Care | Payer: Medicaid Other | Source: Ambulatory Visit | Attending: *Deleted | Admitting: *Deleted

## 2020-04-05 DIAGNOSIS — E871 Hypo-osmolality and hyponatremia: Secondary | ICD-10-CM | POA: Diagnosis present

## 2020-04-05 DIAGNOSIS — I2510 Atherosclerotic heart disease of native coronary artery without angina pectoris: Secondary | ICD-10-CM | POA: Diagnosis present

## 2020-04-05 DIAGNOSIS — E785 Hyperlipidemia, unspecified: Secondary | ICD-10-CM | POA: Diagnosis present

## 2020-04-05 DIAGNOSIS — E669 Obesity, unspecified: Secondary | ICD-10-CM | POA: Diagnosis present

## 2020-04-05 DIAGNOSIS — I252 Old myocardial infarction: Secondary | ICD-10-CM | POA: Diagnosis not present

## 2020-04-05 DIAGNOSIS — L02612 Cutaneous abscess of left foot: Secondary | ICD-10-CM | POA: Diagnosis present

## 2020-04-05 DIAGNOSIS — I5022 Chronic systolic (congestive) heart failure: Secondary | ICD-10-CM | POA: Diagnosis present

## 2020-04-05 DIAGNOSIS — F17210 Nicotine dependence, cigarettes, uncomplicated: Secondary | ICD-10-CM | POA: Diagnosis present

## 2020-04-05 DIAGNOSIS — L03032 Cellulitis of left toe: Secondary | ICD-10-CM | POA: Diagnosis not present

## 2020-04-05 DIAGNOSIS — I429 Cardiomyopathy, unspecified: Secondary | ICD-10-CM | POA: Diagnosis present

## 2020-04-05 DIAGNOSIS — E1169 Type 2 diabetes mellitus with other specified complication: Secondary | ICD-10-CM | POA: Diagnosis present

## 2020-04-05 DIAGNOSIS — M869 Osteomyelitis, unspecified: Secondary | ICD-10-CM | POA: Diagnosis present

## 2020-04-05 DIAGNOSIS — Z79899 Other long term (current) drug therapy: Secondary | ICD-10-CM | POA: Diagnosis not present

## 2020-04-05 DIAGNOSIS — E1142 Type 2 diabetes mellitus with diabetic polyneuropathy: Secondary | ICD-10-CM | POA: Diagnosis present

## 2020-04-05 DIAGNOSIS — Z8249 Family history of ischemic heart disease and other diseases of the circulatory system: Secondary | ICD-10-CM | POA: Diagnosis not present

## 2020-04-05 DIAGNOSIS — Z6832 Body mass index (BMI) 32.0-32.9, adult: Secondary | ICD-10-CM | POA: Diagnosis not present

## 2020-04-05 DIAGNOSIS — I130 Hypertensive heart and chronic kidney disease with heart failure and stage 1 through stage 4 chronic kidney disease, or unspecified chronic kidney disease: Secondary | ICD-10-CM | POA: Diagnosis present

## 2020-04-05 DIAGNOSIS — Z20822 Contact with and (suspected) exposure to covid-19: Secondary | ICD-10-CM | POA: Diagnosis present

## 2020-04-05 DIAGNOSIS — M86172 Other acute osteomyelitis, left ankle and foot: Secondary | ICD-10-CM | POA: Diagnosis present

## 2020-04-05 DIAGNOSIS — Z951 Presence of aortocoronary bypass graft: Secondary | ICD-10-CM

## 2020-04-05 DIAGNOSIS — I480 Paroxysmal atrial fibrillation: Secondary | ICD-10-CM | POA: Diagnosis present

## 2020-04-05 DIAGNOSIS — E1152 Type 2 diabetes mellitus with diabetic peripheral angiopathy with gangrene: Principal | ICD-10-CM | POA: Diagnosis present

## 2020-04-05 DIAGNOSIS — M7989 Other specified soft tissue disorders: Secondary | ICD-10-CM | POA: Diagnosis present

## 2020-04-05 DIAGNOSIS — Z9114 Patient's other noncompliance with medication regimen: Secondary | ICD-10-CM | POA: Diagnosis not present

## 2020-04-05 DIAGNOSIS — I10 Essential (primary) hypertension: Secondary | ICD-10-CM | POA: Diagnosis not present

## 2020-04-05 DIAGNOSIS — E1122 Type 2 diabetes mellitus with diabetic chronic kidney disease: Secondary | ICD-10-CM | POA: Diagnosis present

## 2020-04-05 DIAGNOSIS — E11621 Type 2 diabetes mellitus with foot ulcer: Secondary | ICD-10-CM | POA: Diagnosis present

## 2020-04-05 DIAGNOSIS — Z794 Long term (current) use of insulin: Secondary | ICD-10-CM | POA: Diagnosis not present

## 2020-04-05 DIAGNOSIS — L97529 Non-pressure chronic ulcer of other part of left foot with unspecified severity: Secondary | ICD-10-CM | POA: Diagnosis present

## 2020-04-05 DIAGNOSIS — Z7982 Long term (current) use of aspirin: Secondary | ICD-10-CM

## 2020-04-05 DIAGNOSIS — E1165 Type 2 diabetes mellitus with hyperglycemia: Secondary | ICD-10-CM | POA: Diagnosis present

## 2020-04-05 DIAGNOSIS — E119 Type 2 diabetes mellitus without complications: Secondary | ICD-10-CM

## 2020-04-05 DIAGNOSIS — L98499 Non-pressure chronic ulcer of skin of other sites with unspecified severity: Secondary | ICD-10-CM | POA: Diagnosis present

## 2020-04-05 DIAGNOSIS — N1831 Chronic kidney disease, stage 3a: Secondary | ICD-10-CM | POA: Diagnosis present

## 2020-04-05 DIAGNOSIS — I5023 Acute on chronic systolic (congestive) heart failure: Secondary | ICD-10-CM | POA: Diagnosis present

## 2020-04-05 DIAGNOSIS — Z7902 Long term (current) use of antithrombotics/antiplatelets: Secondary | ICD-10-CM

## 2020-04-05 DIAGNOSIS — E11649 Type 2 diabetes mellitus with hypoglycemia without coma: Secondary | ICD-10-CM | POA: Diagnosis not present

## 2020-04-05 DIAGNOSIS — Z09 Encounter for follow-up examination after completed treatment for conditions other than malignant neoplasm: Secondary | ICD-10-CM

## 2020-04-05 LAB — BASIC METABOLIC PANEL
Anion gap: 9 (ref 5–15)
BUN: 34 mg/dL — ABNORMAL HIGH (ref 6–20)
CO2: 21 mmol/L — ABNORMAL LOW (ref 22–32)
Calcium: 10.9 mg/dL — ABNORMAL HIGH (ref 8.9–10.3)
Chloride: 103 mmol/L (ref 98–111)
Creatinine, Ser: 1.4 mg/dL — ABNORMAL HIGH (ref 0.61–1.24)
GFR calc Af Amer: >60 mL/min (ref >60)
GFR calc non Af Amer: 54 mL/min — ABNORMAL LOW (ref >60)
Glucose, Bld: 73 mg/dL (ref 70–99)
Potassium: 4.9 mmol/L (ref 3.5–5.1)
Sodium: 133 mmol/L — ABNORMAL LOW (ref 135–145)

## 2020-04-05 LAB — CBC
HCT: 37.9 % — ABNORMAL LOW (ref 39.0–52.0)
Hemoglobin: 13.6 g/dL (ref 13.0–17.0)
MCH: 33.1 pg (ref 26.0–34.0)
MCHC: 35.9 g/dL (ref 30.0–36.0)
MCV: 92.2 fL (ref 80.0–100.0)
Platelets: 338 10*3/uL (ref 150–400)
RBC: 4.11 MIL/uL — ABNORMAL LOW (ref 4.22–5.81)
RDW: 13.2 % (ref 11.5–15.5)
WBC: 17.3 10*3/uL — ABNORMAL HIGH (ref 4.0–10.5)
nRBC: 0 % (ref 0.0–0.2)

## 2020-04-05 LAB — SARS CORONAVIRUS 2 BY RT PCR (HOSPITAL ORDER, PERFORMED IN ~~LOC~~ HOSPITAL LAB): SARS Coronavirus 2: NEGATIVE

## 2020-04-05 MED ORDER — VANCOMYCIN HCL IN DEXTROSE 1-5 GM/200ML-% IV SOLN
1000.0000 mg | Freq: Once | INTRAVENOUS | Status: AC
  Administered 2020-04-05: 1000 mg via INTRAVENOUS
  Filled 2020-04-05: qty 200

## 2020-04-05 MED ORDER — SODIUM CHLORIDE 0.9 % IV SOLN
2.0000 g | Freq: Three times a day (TID) | INTRAVENOUS | Status: DC
  Administered 2020-04-05 – 2020-04-10 (×13): 2 g via INTRAVENOUS
  Filled 2020-04-05 (×18): qty 2

## 2020-04-05 MED ORDER — MORPHINE SULFATE (PF) 2 MG/ML IV SOLN
2.0000 mg | Freq: Once | INTRAVENOUS | Status: AC
  Administered 2020-04-05: 2 mg via INTRAVENOUS
  Filled 2020-04-05: qty 1

## 2020-04-05 MED ORDER — VANCOMYCIN HCL 750 MG/150ML IV SOLN
750.0000 mg | Freq: Two times a day (BID) | INTRAVENOUS | Status: DC
  Administered 2020-04-05 – 2020-04-08 (×7): 750 mg via INTRAVENOUS
  Filled 2020-04-05 (×10): qty 150

## 2020-04-05 NOTE — H&P (Signed)
History and Physical   Marc [name] [id number] DOB: 06/15/1961 DOA: 04/05/2020  Referring MD/NP/PA: Dr. Trudie Reed  [facility]P: [facility]   Outpatient Specialists: Dr. Linus Galas, podiatry  Patient coming from: Home  Chief Complaint: Left foot ulcer  HPI: Marc [name] is a 59 y.o. 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  The fat layer was exposed.  Patient has failed to improve in the outpatient setting.  Seen in the ER today with worsening of his ulcer appeared infected failing outpatient therapy and is being admitted to the hospital for treatment.  He denied fever or chills.  Patient has significant neuropathy.  Mild pain only.  Patient being admitted to the hospital and will consult podiatry.  ED Course: Vitals stable and afebrile.  White count 17.3 the rest of the CBC within normal.  Sodium 133 CO2 21 BUN 34 creatinine 1.40 calcium 10.9.  COVID-19 screen negative x-ray of the left foot shows mild gas in the left tissue of the first IP joint probably due to also no evidence of osteomyelitis.  Recommended MRI.  Patient being admitted on IV antibiotics.  Review of Systems: As per HPI otherwise 10 point review of systems negative.    Past Medical History:  Diagnosis Date  . Anxiety   . Cardiomyopathy (HCC)    45-50%  . CHF (congestive heart failure) (HCC)   . Chronic systolic heart failure (HCC) 07/2013   Ejection fraction of 45-50% with possible inferior wall hypokinesis  . Coronary artery disease    Significant left main and Severe three-vessel coronary artery disease in December  of 2014. He underwent CABG in January of 2015 with LIMA to LAD, sequential SVG to ramus/OM 2 and SVG to OM 3  . Diabetes mellitus without complication (HCC)   . Dysrhythmia   . Hyperlipidemia   . Hypertension   . Medical non-compliance   . MI (myocardial infarction) (HCC)   . Obesity   . Tobacco abuse     Past Surgical History:  Procedure Laterality Date  . AMPUTATION TOE Right 02/29/2016   Procedure: AMPUTATION TOE;  Surgeon: Linus Galas, DPM;  Location: [facility] ORS;  Service: Podiatry;  Laterality: Right;  Great toe  . CARDIAC CATHETERIZATION     [facility] no stent  . CORONARY ARTERY BYPASS GRAFT N/A 09/16/2013   Procedure: CORONARY ARTERY BYPASS GRAFTING (CABG);  Surgeon: [name], MD;  Location: [facility] OR;  Service: Open Heart Surgery;  Laterality: N/A;  CABG x four, using left internal mammary artery and right leg greater saphenous vein  . INTRAOPERATIVE TRANSESOPHAGEAL ECHOCARDIOGRAM N/A 09/16/2013   Procedure: INTRAOPERATIVE TRANSESOPHAGEAL ECHOCARDIOGRAM;  Surgeon: [name], MD;  Location[facility];  Service: Open Heart Surgery;  Laterality: N/A;  . knee surgery Right   . LASIK Bilateral      reports that he has been smoking cigarettes. He has a 45.00 pack-year smoking history. He has never used smokeless tobacco. He reports that he does not drink alcohol and does not use drugs.  No Known Allergies  Family History  Problem Relation Age of Onset  . Heart disease Mother  Prior to Admission medications   Medication Sig Start Date End Date Taking? Authorizing Provider  amLODipine (NORVASC) 10 MG tablet Take 10 mg by mouth daily. for high blood pressure 01/31/18  Yes [provider]  aspirin EC 81 MG tablet Take 81 mg by mouth daily. Swallow whole.   Yes [provider]  atorvastatin (LIPITOR) 80 MG tablet Take 1 tablet by mouth every evening. 01/31/18  Yes [provider]  carvedilol (COREG) 25 MG tablet Take 1 tablet (25 mg total) by mouth 2  (two) times daily. 06/26/14  Yes Gollan, Tollie Pizza, MD  cloNIDine (CATAPRES) 0.3 MG tablet Take 1 tablet by mouth 2 (two) times daily. 01/24/18  Yes [provider]  glipiZIDE (GLUCOTROL XL) 10 MG 24 hr tablet Take 1 tablet by mouth every morning. 01/31/18  Yes [provider]  LANTUS SOLOSTAR 100 UNIT/ML Solostar Pen Inject 52 Units into the skin every evening. Patient taking differently: Inject 64 Units into the skin every morning.  03/15/18  Yes Katha Hamming, MD  lisinopril (PRINIVIL,ZESTRIL) 40 MG tablet Take 40 mg by mouth daily. for high blood pressure 01/31/18  Yes [provider]  spironolactone (ALDACTONE) 50 MG tablet Take 100 mg by mouth daily. 03/23/20  Yes [provider]    Physical Exam: Vitals:   04/05/20 1659 04/05/20 1935 04/05/20 2000 04/05/20 2108  BP: 137/69 116/72 95/66 119/67  Pulse: 67 66 66 62  Resp: 18  18 18   Temp: 98.7 F (37.1 C)   98.3 F (36.8 C)  TempSrc: Oral   Oral  SpO2: 99% 96% 97% 99%  Weight: 111.1 kg     Height: 6\' 1"  (1.854 m)         Constitutional: NAD, calm, comfortable Vitals:   04/05/20 1659 04/05/20 1935 04/05/20 2000 04/05/20 2108  BP: 137/69 116/72 95/66 119/67  Pulse: 67 66 66 62  Resp: 18  18 18   Temp: 98.7 F (37.1 C)   98.3 F (36.8 C)  TempSrc: Oral   Oral  SpO2: 99% 96% 97% 99%  Weight: 111.1 kg     Height: 6\' 1"  (1.854 m)      Eyes: PERRL, lids and conjunctivae normal ENMT: Mucous membranes are dry. Posterior pharynx clear of any exudate or lesions.Normal dentition.  Neck: normal, supple, no masses, no thyromegaly Respiratory: clear to auscultation bilaterally, no wheezing, no crackles. Normal respiratory effort. No accessory muscle use.  Cardiovascular: Regular rate and rhythm, no murmurs / rubs / gallops. No extremity edema. 2+ pedal pulses. No carotid bruits.  Abdomen: no tenderness, no masses palpated. No hepatosplenomegaly. Bowel sounds positive.  Musculoskeletal: no  clubbing / cyanosis. No joint deformity upper and lower extremities. Good ROM, no contractures. Normal muscle tone.  Skin: Left great toe ulcer on the plantar surface (appears deep with exposure of the fat layer with some serosanguineous discharge  neurologic: CN 2-12 grossly intact. Sensation intact, DTR normal. Strength 5/5 in all 4.  Psychiatric: Normal judgment and insight. Alert and oriented x 3. Normal mood.     Labs on Admission: I have personally reviewed following labs and imaging studies  CBC: Recent Labs  Lab 04/05/20 1741  WBC 17.3*  HGB 13.6  HCT 37.9*  MCV 92.2  PLT 338   Basic Metabolic Panel: Recent Labs  Lab 04/05/20 1741  NA 133*  K 4.9  CL 103  CO2 21*  GLUCOSE 73  BUN 34*  CREATININE 1.40*  CALCIUM 10.9*   GFR: Estimated Creatinine Clearance:  73.3 mL/min (A) (by C-G formula based on SCr of 1.4 mg/dL (H)). Liver Function Tests: No results for input(s): AST, ALT, ALKPHOS, BILITOT, PROT, ALBUMIN in the last 168 hours. No results for input(s): LIPASE, AMYLASE in the last 168 hours. No results for input(s): AMMONIA in the last 168 hours. Coagulation Profile: No results for input(s): INR, PROTIME in the last 168 hours. Cardiac Enzymes: No results for input(s): CKTOTAL, CKMB, CKMBINDEX, TROPONINI in the last 168 hours. BNP (last 3 results) No results for input(s): PROBNP in the last 8760 hours. HbA1C: No results for input(s): HGBA1C in the last 72 hours. CBG: No results for input(s): GLUCAP in the last 168 hours. Lipid Profile: No results for input(s): CHOL, HDL, LDLCALC, TRIG, CHOLHDL, LDLDIRECT in the last 72 hours. Thyroid Function Tests: No results for input(s): TSH, T4TOTAL, FREET4, T3FREE, THYROIDAB in the last 72 hours. Anemia Panel: No results for input(s): VITAMINB12, FOLATE, FERRITIN, TIBC, IRON, RETICCTPCT in the last 72 hours. Urine analysis:    Component Value Date/Time   COLORURINE Yellow 09/18/2014 2018   COLORURINE YELLOW  09/12/2013 1555   APPEARANCEUR Clear 09/18/2014 2018   LABSPEC 1.005 09/18/2014 2018   PHURINE 6.0 09/18/2014 2018   PHURINE 5.5 09/12/2013 1555   GLUCOSEU 150 mg/dL 55/73/2202 5427   HGBUR Negative 09/18/2014 2018   HGBUR SMALL (A) 09/12/2013 1555   BILIRUBINUR Negative 09/18/2014 2018   KETONESUR Negative 09/18/2014 2018   KETONESUR 15 (A) 09/12/2013 1555   PROTEINUR 100 mg/dL 02/25/7627 3151   PROTEINUR 100 (A) 09/12/2013 1555   UROBILINOGEN 0.2 09/12/2013 1555   NITRITE Negative 09/18/2014 2018   NITRITE NEGATIVE 09/12/2013 1555   LEUKOCYTESUR Negative 09/18/2014 2018   Sepsis Labs: @LABRCNTIP (procalcitonin:4,lacticidven:4) ) Recent Results (from the past 240 hour(s))  SARS Coronavirus 2 by RT PCR (hospital order, performed in [facility] hospital lab) Nasopharyngeal Nasopharyngeal Swab     Status: None   Collection Time: 04/05/20  6:57 PM   Specimen: Nasopharyngeal Swab  Result Value Ref Range Status   SARS Coronavirus 2 NEGATIVE NEGATIVE Final    Comment: (NOTE) SARS-CoV-2 target nucleic acids are NOT DETECTED.  The SARS-CoV-2 RNA is generally detectable in upper and lower respiratory specimens during the acute phase of infection. The lowest concentration of SARS-CoV-2 viral copies this assay can detect is 250 copies / mL. A negative result does not preclude SARS-CoV-2 infection and should not be used as the sole basis for treatment or other patient management decisions.  A negative result may occur with improper specimen collection / handling, submission of specimen other than nasopharyngeal swab, presence of viral mutation(s) within the areas targeted by this assay, and inadequate number of viral copies (<250 copies / mL). A negative result must be combined with clinical observations, patient history, and epidemiological information.  Fact Sheet for Patients:   06/05/20  Fact Sheet for Healthcare  Providers: BoilerBrush.com.cy  This test is not yet approved or  cleared by the https://pope.com/ FDA and has been authorized for detection and/or diagnosis of SARS-CoV-2 by FDA under an Emergency Use Authorization (EUA).  This EUA will remain in effect (meaning this test can be used) for the duration of the COVID-19 declaration under Section 564(b)(1) of the Act, 21 U.S.C. section 360bbb-3(b)(1), unless the authorization is terminated or revoked sooner.  Performed at [facility], [address], [place], [place]      Radiological Exams on Admission: DG Foot Complete Left  Result Date: 04/05/2020 CLINICAL DATA:  Cellulitis, ulcer  to the great toe EXAM: LEFT FOOT - COMPLETE 3+ VIEW COMPARISON:  12/04/2013 FINDINGS: No acute displaced fracture or malalignment. Old fracture deformities of the third fourth and fifth distal metatarsals as before. Chronic erosive change at the head of the second metatarsal. Gas in the soft tissues at the first IP joint likely due to ulcer. No underlying bony destructive change. Large plantar calcaneal spur. Vascular calcifications. IMPRESSION: 1. Mild gas in the soft tissues at the first IP joint likely due to ulcer. No definite radiographic evidence for osteomyelitis. MRI follow-up should be considered if persistent clinical concern for osteomyelitis. 2. Chronic deformities at the third through fifth metatarsals. Electronically Signed   By: Jasmine Pang M.D.   On: 04/05/2020 19:46      Assessment/Plan Principal Problem:   Cellulitis and abscess of toe of left foot Active Problems:   Chronic systolic heart failure (HCC)   Hypertension   Hyperlipidemia   PAF (paroxysmal atrial fibrillation) (HCC)   Coronary artery disease   Diabetes (HCC)     #1 cellulitis and abscess of the left big toe, patient has failed outpatient therapy.  Admit the patient.  IV Vanco and cefepime.  Podiatry consult in the morning.  Patient  may require amputation of the left big toe as well.  #2 diabetes: Poorly controlled.  Follow A1c.  Sliding scale insulin  #3 hypertension: Continue blood pressure control.  #4 paroxysmal atrial fibrillation: In sinus rhythm at the moment.  #5 systolic dysfunction CHF: Continue on telemetry.  Appears compensated  #6 coronary artery disease: No acute decompensation.    DVT prophylaxis: Lovenox  Code Status: Full code Family Communication: No family at bedside Disposition Plan: Home Consults called: Dr. Alberteen Spindle to be consulted in the morning Admission status: Inpatient  Severity of Illness: The appropriate patient status for this patient is INPATIENT. Inpatient status is judged to be reasonable and necessary in order to provide the required intensity of service to ensure the patient's safety. The patient's presenting symptoms, physical exam findings, and initial radiographic and laboratory data in the context of their chronic comorbidities is felt to place them at high risk for further clinical deterioration. Furthermore, it is not anticipated that the patient will be medically stable for discharge from the hospital within 2 midnights of admission. The following factors support the patient status of inpatient.   " The patient's presenting symptoms include left foot swelling and also. " The worrisome physical exam findings include open wound with open drainage. " The initial radiographic and laboratory data are worrisome because of no evidence of osteomyelitis. " The chronic co-morbidities include diabetes with peripheral vascular disease.   * I certify that at the point of admission it is my clinical judgment that the patient will require inpatient hospital care spanning beyond 2 midnights from the point of admission due to high intensity of service, high risk for further deterioration and high frequency of surveillance required.Lonia Blood MD [facility] Pager [phone]  If 7PM-7AM, please contact night-coverage [email or web address] Password [email or web address]  04/05/2020, 9:11 PM

## 2020-04-05 NOTE — Progress Notes (Signed)
Pharmacy Antibiotic Note  Marc [name] is a 59 y.o. male admitted on 04/05/2020 with cellulitis.  Pharmacy has been consulted for Cefepime and Vancomycin dosing.  Plan: Vancomycin 750 IV every 12 hours.  Goal trough 10-15 mcg/mL.  Cefepime 2g IV q8h  Height: 6\' 1"  (185.4 cm) Weight: 111.1 kg (245 lb) IBW/kg (Calculated) : 79.9  Temp (24hrs), Avg:98.5 F (36.9 C), Min:98.3 F (36.8 C), Max:98.7 F (37.1 C)  Recent Labs  Lab 04/05/20 1741  WBC 17.3*  CREATININE 1.40*    Estimated Creatinine Clearance: 73.3 mL/min (A) (by C-G formula based on SCr of 1.4 mg/dL (H)).    No Known Allergies  Antimicrobials this admission: Cefepime 8/2 >>  Vancomycin 8/2 >>   Dose adjustments this admission:  Microbiology results:  Thank you for allowing pharmacy to be a part of this patient's care.  06/05/20, PharmD, BCPS 04/05/2020 9:41 PM

## 2020-04-05 NOTE — ED Triage Notes (Signed)
Pt has ulcer to left great toe since last week.   Drainage and smell noted.  Pt has blackened area to center of ulcer.  Pt has pain in left foot.  Hx diabetic.  Pt alert  Speech clear.

## 2020-04-06 ENCOUNTER — Encounter: Payer: Self-pay | Admitting: Internal Medicine

## 2020-04-06 ENCOUNTER — Other Ambulatory Visit (INDEPENDENT_AMBULATORY_CARE_PROVIDER_SITE_OTHER): Payer: Self-pay | Admitting: Vascular Surgery

## 2020-04-06 DIAGNOSIS — I70244 Atherosclerosis of native arteries of left leg with ulceration of heel and midfoot: Secondary | ICD-10-CM

## 2020-04-06 DIAGNOSIS — E1169 Type 2 diabetes mellitus with other specified complication: Secondary | ICD-10-CM

## 2020-04-06 DIAGNOSIS — I5022 Chronic systolic (congestive) heart failure: Secondary | ICD-10-CM

## 2020-04-06 DIAGNOSIS — Z794 Long term (current) use of insulin: Secondary | ICD-10-CM

## 2020-04-06 LAB — HEMOGLOBIN A1C
Hgb A1c MFr Bld: 7.3 % — ABNORMAL HIGH (ref 4.8–5.6)
Mean Plasma Glucose: 162.81 mg/dL

## 2020-04-06 LAB — COMPREHENSIVE METABOLIC PANEL
ALT: 16 U/L (ref 0–44)
AST: 12 U/L — ABNORMAL LOW (ref 15–41)
Albumin: 3.5 g/dL (ref 3.5–5.0)
Alkaline Phosphatase: 48 U/L (ref 38–126)
Anion gap: 8 (ref 5–15)
BUN: 33 mg/dL — ABNORMAL HIGH (ref 6–20)
CO2: 21 mmol/L — ABNORMAL LOW (ref 22–32)
Calcium: 9.7 mg/dL (ref 8.9–10.3)
Chloride: 104 mmol/L (ref 98–111)
Creatinine, Ser: 1.29 mg/dL — ABNORMAL HIGH (ref 0.61–1.24)
GFR calc Af Amer: >60 mL/min (ref >60)
GFR calc non Af Amer: 60 mL/min — ABNORMAL LOW (ref >60)
Glucose, Bld: 58 mg/dL — ABNORMAL LOW (ref 70–99)
Potassium: 4.4 mmol/L (ref 3.5–5.1)
Sodium: 133 mmol/L — ABNORMAL LOW (ref 135–145)
Total Bilirubin: 0.9 mg/dL (ref 0.3–1.2)
Total Protein: 6.6 g/dL (ref 6.5–8.1)

## 2020-04-06 LAB — HIV ANTIBODY (ROUTINE TESTING W REFLEX): HIV Screen 4th Generation wRfx: NONREACTIVE

## 2020-04-06 LAB — GLUCOSE, CAPILLARY
Glucose-Capillary: 107 mg/dL — ABNORMAL HIGH (ref 70–99)
Glucose-Capillary: 109 mg/dL — ABNORMAL HIGH (ref 70–99)
Glucose-Capillary: 109 mg/dL — ABNORMAL HIGH (ref 70–99)
Glucose-Capillary: 134 mg/dL — ABNORMAL HIGH (ref 70–99)
Glucose-Capillary: 201 mg/dL — ABNORMAL HIGH (ref 70–99)
Glucose-Capillary: 66 mg/dL — ABNORMAL LOW (ref 70–99)
Glucose-Capillary: 66 mg/dL — ABNORMAL LOW (ref 70–99)
Glucose-Capillary: 84 mg/dL (ref 70–99)
Glucose-Capillary: 84 mg/dL (ref 70–99)

## 2020-04-06 LAB — CBC
HCT: 32.9 % — ABNORMAL LOW (ref 39.0–52.0)
Hemoglobin: 11 g/dL — ABNORMAL LOW (ref 13.0–17.0)
MCH: 32.1 pg (ref 26.0–34.0)
MCHC: 33.4 g/dL (ref 30.0–36.0)
MCV: 95.9 fL (ref 80.0–100.0)
Platelets: 296 10*3/uL (ref 150–400)
RBC: 3.43 MIL/uL — ABNORMAL LOW (ref 4.22–5.81)
RDW: 13.1 % (ref 11.5–15.5)
WBC: 10 10*3/uL (ref 4.0–10.5)
nRBC: 0 % (ref 0.0–0.2)

## 2020-04-06 MED ORDER — SODIUM CHLORIDE 0.45 % IV SOLN: INTRAVENOUS | Status: DC

## 2020-04-06 MED ORDER — AMLODIPINE BESYLATE 10 MG PO TABS
10.0000 mg | ORAL_TABLET | Freq: Every day | ORAL | Status: DC
  Administered 2020-04-06 – 2020-04-10 (×4): 10 mg via ORAL
  Filled 2020-04-06 (×5): qty 1

## 2020-04-06 MED ORDER — INSULIN ASPART 100 UNIT/ML ~~LOC~~ SOLN
0.0000 [IU] | Freq: Three times a day (TID) | SUBCUTANEOUS | Status: DC
  Administered 2020-04-06: 2 [IU] via SUBCUTANEOUS
  Administered 2020-04-07: 3 [IU] via SUBCUTANEOUS
  Administered 2020-04-08: 8 [IU] via SUBCUTANEOUS
  Administered 2020-04-08: 2 [IU] via SUBCUTANEOUS
  Administered 2020-04-08: 3 [IU] via SUBCUTANEOUS
  Administered 2020-04-09: 8 [IU] via SUBCUTANEOUS
  Administered 2020-04-09: 3 [IU] via SUBCUTANEOUS
  Administered 2020-04-10: 8 [IU] via SUBCUTANEOUS
  Administered 2020-04-10: 5 [IU] via SUBCUTANEOUS
  Filled 2020-04-06 (×9): qty 1

## 2020-04-06 MED ORDER — ALUM & MAG HYDROXIDE-SIMETH 200-200-20 MG/5ML PO SUSP
30.0000 mL | ORAL | Status: DC | PRN
  Administered 2020-04-06 – 2020-04-09 (×2): 30 mL via ORAL
  Filled 2020-04-06 (×2): qty 30

## 2020-04-06 MED ORDER — INSULIN ASPART 100 UNIT/ML ~~LOC~~ SOLN
0.0000 [IU] | Freq: Every day | SUBCUTANEOUS | Status: DC
  Administered 2020-04-06 – 2020-04-07 (×2): 2 [IU] via SUBCUTANEOUS

## 2020-04-06 MED ORDER — ENOXAPARIN SODIUM 40 MG/0.4ML ~~LOC~~ SOLN
40.0000 mg | SUBCUTANEOUS | Status: DC
  Administered 2020-04-06: 40 mg via SUBCUTANEOUS
  Filled 2020-04-06: qty 0.4

## 2020-04-06 MED ORDER — SODIUM CHLORIDE 0.9 % IV SOLN: INTRAVENOUS | Status: DC

## 2020-04-06 MED ORDER — HYDROMORPHONE HCL 1 MG/ML IJ SOLN
0.5000 mg | INTRAMUSCULAR | Status: DC | PRN
  Administered 2020-04-06 – 2020-04-08 (×7): 1 mg via INTRAVENOUS
  Filled 2020-04-06 (×7): qty 1

## 2020-04-06 MED ORDER — CLONIDINE HCL 0.1 MG PO TABS
0.3000 mg | ORAL_TABLET | Freq: Two times a day (BID) | ORAL | Status: DC
  Administered 2020-04-06 – 2020-04-10 (×7): 0.3 mg via ORAL
  Filled 2020-04-06 (×8): qty 3

## 2020-04-06 MED ORDER — ONDANSETRON HCL 4 MG PO TABS: 4.0000 mg | ORAL_TABLET | Freq: Four times a day (QID) | ORAL | Status: DC | PRN

## 2020-04-06 MED ORDER — CARVEDILOL 25 MG PO TABS
25.0000 mg | ORAL_TABLET | Freq: Two times a day (BID) | ORAL | Status: DC
  Administered 2020-04-06 – 2020-04-10 (×7): 25 mg via ORAL
  Filled 2020-04-06 (×8): qty 1

## 2020-04-06 MED ORDER — ONDANSETRON HCL 4 MG/2ML IJ SOLN: 4.0000 mg | Freq: Four times a day (QID) | INTRAMUSCULAR | Status: DC | PRN

## 2020-04-06 MED ORDER — ALPRAZOLAM 0.5 MG PO TABS
0.5000 mg | ORAL_TABLET | Freq: Every evening | ORAL | Status: DC | PRN
  Administered 2020-04-06 – 2020-04-09 (×3): 0.5 mg via ORAL
  Filled 2020-04-06 (×3): qty 1

## 2020-04-06 NOTE — H&P (View-Only) (Signed)
[facility] Vascular Consult Note  MRN : [id number]  RECE Marc [name] is a 59 y.o. (September 12, 1960) male who presents with chief complaint of  Chief Complaint  Patient presents with  . 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  The fat layer was exposed.  Patient has failed to improve in the outpatient setting. Seen in the ER today with worsening of his ulcer appeared infected failing outpatient therapy and is being admitted to the hospital for treatment.  He denied fever or chills.  Patient has significant neuropathy.  Mild pain only.  Patient being admitted to the hospital and will consult podiatry.   Of note: Patient with CAD s/p cardiac bypass  Vascular surgery was consulted by Dr. Chipper Herb for possible endovascular intervention.  Current Facility-Administered Medications  Medication Dose Route Frequency Provider Last Rate Last Admin  . ceFEPIme (MAXIPIME) 2 g in sodium chloride 0.9 % 100 mL IVPB  2 g Intravenous Q8H Garba, Mohammad L, MD 200 mL/hr at 04/06/20 0507 2 g at 04/06/20 0507  . enoxaparin (LOVENOX) injection 40 mg  40 mg Subcutaneous Q24H Earlie Lou L, MD   40 mg at 04/06/20 0509  . HYDROmorphone (DILAUDID) injection 0.5-1 mg  0.5-1 mg Intravenous Q2H PRN Rometta Emery, MD   1 mg at 04/06/20 0833  . insulin aspart (novoLOG) injection 0-15 Units  0-15 Units Subcutaneous TID WC Garba, Mohammad L, MD      . insulin aspart (novoLOG) injection 0-5 Units  0-5 Units Subcutaneous QHS Garba, Mohammad L,  MD      . ondansetron (ZOFRAN) tablet 4 mg  4 mg Oral Q6H PRN Rometta Emery, MD       Or  . ondansetron (ZOFRAN) injection 4 mg  4 mg Intravenous Q6H PRN Earlie Lou L, MD      . vancomycin (VANCOREADY) IVPB 750 mg/150 mL  750 mg Intravenous Q12H Earlie Lou L, MD 150 mL/hr at 04/06/20 0836 750 mg at 04/06/20 0836   Past Medical History:  Diagnosis Date  . Anxiety   . Cardiomyopathy (HCC)    45-50%  . CHF (congestive heart failure) (HCC)   . Chronic systolic heart failure (HCC) 07/2013   Ejection fraction of 45-50% with possible inferior wall hypokinesis  . Coronary artery disease    Significant left main and Severe three-vessel coronary artery disease in December of 2014. He underwent CABG in January of 2015 with LIMA to LAD, sequential SVG to ramus/OM 2 and SVG to OM 3  . Diabetes mellitus without complication (HCC)   . Dysrhythmia   . Hyperlipidemia   . Hypertension   . Medical non-compliance   . MI (myocardial infarction) (HCC)   . Obesity   . Tobacco abuse    Past Surgical History:  Procedure Laterality Date  . AMPUTATION TOE Right 02/29/2016   Procedure: AMPUTATION TOE;  Surgeon: Linus Galas, DPM;  Location: [facility] ORS;  Service: Podiatry;  Laterality: Right;  Great toe  . CARDIAC CATHETERIZATION     [facility] no stent  . CORONARY  ARTERY BYPASS GRAFT N/A 09/16/2013   Procedure: CORONARY ARTERY BYPASS GRAFTING (CABG);  Surgeon: [name], MD;  Location: [facility];  Service: Open Heart Surgery;  Laterality: N/A;  CABG x four, using left internal mammary artery and right leg greater saphenous vein  . INTRAOPERATIVE TRANSESOPHAGEAL ECHOCARDIOGRAM N/A 09/16/2013   Procedure: INTRAOPERATIVE TRANSESOPHAGEAL ECHOCARDIOGRAM;  Surgeon: [name], MD;  Location: [facility] OR;  Service: Open Heart Surgery;  Laterality: N/A;  . knee surgery Right   . LASIK Bilateral    Social History Social History   Tobacco Use  . Smoking status: Current Every Day Smoker    Packs/day: 1.00     Years: 45.00    Pack years: 45.00    Types: Cigarettes  . Smokeless tobacco: Never Used  Substance Use Topics  . Alcohol use: No    Alcohol/week: 0.0 standard drinks    Comment: occassional  . Drug use: No   Family History Family History  Problem Relation Age of Onset  . Heart disease Mother   Denies family history of peripheral artery disease, venous disease or renal disease.  No Known Allergies  REVIEW OF SYSTEMS (Negative unless checked)  Constitutional: [] Weight loss  [] Fever  [] Chills Cardiac: [] Chest pain   [] Chest pressure   [] Palpitations   [] Shortness of breath when laying flat   [] Shortness of breath at rest   [] Shortness of breath with exertion. Vascular:  [] Pain in legs with walking   [] Pain in legs at rest   [x] Pain in legs when laying flat   [] Claudication   [x] Pain in feet when walking  [x] Pain in feet at rest  [x] Pain in feet when laying flat   [] History of DVT   [] Phlebitis   [] Swelling in legs   [] Varicose veins   [] Non-healing ulcers Pulmonary:   [] Uses home oxygen   [] Productive cough   [] Hemoptysis   [] Wheeze  [] COPD   [] Asthma Neurologic:  [] Dizziness  [] Blackouts   [] Seizures   [] History of stroke   [] History of TIA  [] Aphasia   [] Temporary blindness   [] Dysphagia   [] Weakness or numbness in arms   [] Weakness or numbness in legs Musculoskeletal:  [] Arthritis   [] Joint swelling   [] Joint pain   [] Low back pain Hematologic:  [] Easy bruising  [] Easy bleeding   [] Hypercoagulable state   [] Anemic  [] Hepatitis Gastrointestinal:  [] Blood in stool   [] Vomiting blood  [] Gastroesophageal reflux/heartburn   [] Difficulty swallowing. Genitourinary:  [] Chronic kidney disease   [] Difficult urination  [] Frequent urination  [] Burning with urination   [] Blood in urine Skin:  [] Rashes   [x] Ulcers   [x] Wounds Psychological:  [] History of anxiety   []  History of major depression.  Physical Examination  Vitals:   04/05/20 1935 04/05/20 2000 04/05/20 2108 04/06/20 0547  BP:  116/72 95/66 119/67 113/66  Pulse: 66 66 62 60  Resp:  18 18 18   Temp:   98.3 F (36.8 C) 98.6 F (37 C)  TempSrc:   Oral Oral  SpO2: 96% 97% 99% 96%  Weight:      Height:       Body mass index is 32.32 kg/m. Gen:  WD/WN, NAD Head: /AT, No temporalis wasting. Prominent temp pulse not noted. Ear/Nose/Throat: Hearing grossly intact, nares w/o erythema or drainage, oropharynx w/o Erythema/Exudate Eyes: Sclera non-icteric, conjunctiva clear Neck: Trachea midline.  No JVD.  Pulmonary:  Good air movement, respirations not labored, equal bilaterally.  Cardiac: RRR, normal S1, S2. Vascular:  Vessel Right Left  Radial Palpable  Palpable  Ulnar Palpable Palpable  Brachial Palpable Palpable  Carotid Palpable, without bruit Palpable, without bruit  Aorta Not palpable N/A  Femoral Palpable Palpable  Popliteal Palpable Palpable  PT Palpable Non-Palpable  DP Palpable Non-Palpable   Left Lower Extremity: Thigh soft.  Calf soft.  Extremities warm distally toes.  Hard to palpate pedal pulses.  Ulceration noted to the great big toe with fat layer exposed.  Gastrointestinal: soft, non-tender/non-distended. No guarding/reflex.  Musculoskeletal: M/S 5/5 throughout.  Extremities without ischemic changes.  No deformity or atrophy. No edema. Neurologic: Sensation grossly intact in extremities.  Symmetrical.  Speech is fluent. Motor exam as listed above. Psychiatric: Judgment intact, Mood & affect appropriate for pt's clinical situation. Dermatologic: As above Lymph : No Cervical, Axillary, or Inguinal lymphadenopathy.  CBC Lab Results  Component Value Date   WBC 10.0 04/06/2020   HGB 11.0 (L) 04/06/2020   HCT 32.9 (L) 04/06/2020   MCV 95.9 04/06/2020   PLT 296 04/06/2020   BMET    Component Value Date/Time   NA 133 (L) 04/06/2020 0423   NA 134 (L) 09/18/2014 1835   K 4.4 04/06/2020 0423   K 4.0 09/18/2014 1835   CL 104 04/06/2020 0423   CL 102 09/18/2014 1835   CO2 21 (L)  04/06/2020 0423   CO2 24 09/18/2014 1835   GLUCOSE 58 (L) 04/06/2020 0423   GLUCOSE 352 (H) 09/18/2014 1835   BUN 33 (H) 04/06/2020 0423   BUN 18 09/18/2014 1835   CREATININE 1.29 (H) 04/06/2020 0423   CREATININE 1.23 09/18/2014 1835   CALCIUM 9.7 04/06/2020 0423   CALCIUM 8.8 09/18/2014 1835   GFRNONAA 60 (L) 04/06/2020 0423   GFRNONAA >60 09/18/2014 1835   GFRNONAA >60 12/04/2013 0953   GFRAA >60 04/06/2020 0423   GFRAA >60 09/18/2014 1835   GFRAA >60 12/04/2013 0953   Estimated Creatinine Clearance: 79.6 mL/min (A) (by C-G formula based on SCr of 1.29 mg/dL (H)).  COAG Lab Results  Component Value Date   INR 1.03 11/14/2015   INR 1.0 09/18/2014   INR 3.3 10/15/2013   Radiology DG Foot Complete Left  Result Date: 04/05/2020 CLINICAL DATA:  Cellulitis, ulcer to the great toe EXAM: LEFT FOOT - COMPLETE 3+ VIEW COMPARISON:  12/04/2013 FINDINGS: No acute displaced fracture or malalignment. Old fracture deformities of the third fourth and fifth distal metatarsals as before. Chronic erosive change at the head of the second metatarsal. Gas in the soft tissues at the first IP joint likely due to ulcer. No underlying bony destructive change. Large plantar calcaneal spur. Vascular calcifications. IMPRESSION: 1. Mild gas in the soft tissues at the first IP joint likely due to ulcer. No definite radiographic evidence for osteomyelitis. MRI follow-up should be considered if persistent clinical concern for osteomyelitis. 2. Chronic deformities at the third through fifth metatarsals. Electronically Signed   By: Jasmine Pang M.D.   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  Atherosclerotic disease with ulceration: Patient with multiple risk factors of atherosclerotic disease.  Status post cardiac bypass in the past.  Patient with nonhealing ulceration to the left foot.  In the setting  of multiple risk factors for atherosclerotic disease, nonhealing wound from previous amputation to the right foot recommend undergoing a left lower extremity angiogram possible intervention assess the patient's anatomy and contributing degree of atherosclerotic disease.  If appropriate, attempt to revascularize leg can be at that time.  Procedure, risks and benefits were explained to the patient.  All questions were answered.  The patient wishes to proceed. Will hydrate in preparation for angiogram tomorrow.  2. Tobacco Abuse: We had a discussion for approximately three minutes regarding the absolute need for smoking cessation due to the deleterious nature of tobacco on the vascular system. We discussed the tobacco use would diminish patency of any intervention, and likely significantly worsen progression of disease. We discussed multiple agents for quitting including replacement therapy or medications to reduce cravings such as Chantix. The patient voices their understanding of the importance of smoking cessation.  3.  Hyperlipidemia: On ASA and statin for medical management Encouraged good control as its slows the progression of atherosclerotic disease  4.  Diabetes: On appropriate medications Encouraged good control as its slows the progression of atherosclerotic disease  Marc [name] A Rechel Delosreyes, PA-C  04/06/2020 9:34 AM  This note was created with Dragon medical transcription system.  Any error is purely unintentional.

## 2020-04-06 NOTE — Consult Note (Signed)
PODIATRY / FOOT AND ANKLE SURGERY CONSULTATION NOTE  Requesting Physician: Marrion Coy, MD  Reason for consult: Left hallux ulcer/infection  Chief Complaint: Left big toe sore   HPI: Marc [name] is a 59 y.o. male who presented on 04/05/20 with a nonhealing ulceration to the left big toe.  Patient was seen by myself and Dr. Alberteen Spindle yesterday in clinic and was noted to have a worsening ulceration with gangrenous changes to the left hallux IPJ with cellulitic extension to the first metatarsal phalangeal joint.  Patient was immediately sent to the emergency room for work-up and admission.  Patient was noted to have calcification of vessels as well as some subcutaneous emphysema on previous x-ray imaging as well as subtle changes to the IPJ hallux consistent with possible osteomyelitis.  Patient has a history of vascular disease and vascular team was consulted and they are planning an angiogram tomorrow.  PMHx:  Past Medical History:  Diagnosis Date  . Anxiety   . Cardiomyopathy (HCC)    45-50%  . CHF (congestive heart failure) (HCC)   . Chronic systolic heart failure (HCC) 07/2013   Ejection fraction of 45-50% with possible inferior wall hypokinesis  . Coronary artery disease    Significant left main and Severe three-vessel coronary artery disease in December of 2014. He underwent CABG in January of 2015 with LIMA to LAD, sequential SVG to ramus/OM 2 and SVG to OM 3  . Diabetes mellitus without complication (HCC)   . Dysrhythmia   . Hyperlipidemia   . Hypertension   . Medical non-compliance   . MI (myocardial infarction) (HCC)   . Obesity   . Tobacco abuse     Surgical Hx:  Past Surgical History:  Procedure Laterality Date  . AMPUTATION TOE Right 02/29/2016   Procedure: AMPUTATION TOE;  Surgeon: Linus Galas, DPM;  Location: [facility] ORS;  Service: Podiatry;  Laterality: Right;  Great toe  . CARDIAC CATHETERIZATION     [facility] no stent  . CORONARY ARTERY BYPASS GRAFT N/A 09/16/2013    Procedure: CORONARY ARTERY BYPASS GRAFTING (CABG);  Surgeon: [name], MD;  Location: [facility] OR;  Service: Open Heart Surgery;  Laterality: N/A;  CABG x four, using left internal mammary artery and right leg greater saphenous vein  . INTRAOPERATIVE TRANSESOPHAGEAL ECHOCARDIOGRAM N/A 09/16/2013   Procedure: INTRAOPERATIVE TRANSESOPHAGEAL ECHOCARDIOGRAM;  Surgeon: [name], MD;  Location: [facility] OR;  Service: Open Heart Surgery;  Laterality: N/A;  . knee surgery Right   . LASIK Bilateral     FHx:  Family History  Problem Relation Age of Onset  . Heart disease Mother     Social History:  reports that he has been smoking cigarettes. He has a 45.00 pack-year smoking history. He has never used smokeless tobacco. He reports that he does not drink alcohol and does not use drugs.  Allergies: No Known Allergies  Review of Systems: General ROS: negative Respiratory ROS: no cough, shortness of breath, or wheezing Cardiovascular ROS: no chest pain or dyspnea on exertion Gastrointestinal ROS: no abdominal pain, change in bowel habits, or black or bloody stools Musculoskeletal ROS: positive for - joint pain and joint swelling Neurological ROS: positive for - numbness/tingling Dermatological ROS: positive for Left hallux ulceration with redness and swelling/purulence  Medications Prior to Admission  Medication Sig Dispense Refill  . amLODipine (NORVASC) 10 MG tablet Take 10 mg by mouth daily. for high blood pressure  2  . aspirin EC 81 MG tablet Take 81 mg by  mouth daily. Swallow whole.    Marland Kitchen atorvastatin (LIPITOR) 80 MG tablet Take 1 tablet by mouth every evening.  2  . carvedilol (COREG) 25 MG tablet Take 1 tablet (25 mg total) by mouth 2 (two) times daily. 60 tablet 6  . cloNIDine (CATAPRES) 0.3 MG tablet Take 1 tablet by mouth 2 (two) times daily.  5  . glipiZIDE (GLUCOTROL XL) 10 MG 24 hr tablet Take 1 tablet by mouth every morning.  2  . LANTUS SOLOSTAR 100 UNIT/ML Solostar Pen Inject 52  Units into the skin every evening. (Patient taking differently: Inject 64 Units into the skin every morning. ) 15 mL 2  . lisinopril (PRINIVIL,ZESTRIL) 40 MG tablet Take 40 mg by mouth daily. for high blood pressure  2  . spironolactone (ALDACTONE) 50 MG tablet Take 100 mg by mouth daily.      Physical Exam: General: Alert and oriented.  No apparent distress.  Vascular: DP/PT pulses +1/faintly palpable bilateral, appearing diminished.  Significant erythema and edema present to the left hallux to the first metatarsal phalangeal joint.  No hair growth noted to digits both feet  Neuro: Light touch sensation to the lower extremities reduced bilateral.  Derm: Open ulceration to the left IPJ hallux at the dorsal medial aspect with area that probes to bone, purulent drainage expressed, appears to have some early gangrenous changes with dark black necrotic tissue around the periphery of the wound.  Appears to have erythema and edema extending from the wound proximally to the first metatarsal phalangeal joint.  Malodor also noted.  Measures 3 x 2 x 0.5 to bone.  MSK: Previous right hallux amputation.  Minimal to no pain on palpation of the left big toe or foot.  Hammertoes both feet.  Results for orders placed or performed during the hospital encounter of 04/05/20 (from the past 48 hour(s))  Basic metabolic panel     Status: Abnormal   Collection Time: 04/05/20  5:41 PM  Result Value Ref Range   Sodium 133 (L) 135 - 145 mmol/L   Potassium 4.9 3.5 - 5.1 mmol/L   Chloride 103 98 - 111 mmol/L   CO2 21 (L) 22 - 32 mmol/L   Glucose, Bld 73 70 - 99 mg/dL    Comment: Glucose reference range applies only to samples taken after fasting for at least 8 hours.   BUN 34 (H) 6 - 20 mg/dL   Creatinine, Ser 0.35 (H) 0.61 - 1.24 mg/dL   Calcium 46.5 (H) 8.9 - 10.3 mg/dL   GFR calc non Af Amer 54 (L) >60 mL/min   GFR calc Af Amer >60 >60 mL/min   Anion gap 9 5 - 15    Comment: Performed at [facility], [address] [place], [place] [address]  CBC     Status: Abnormal   Collection Time: 04/05/20  5:41 PM  Result Value Ref Range   WBC 17.3 (H) 4.0 - 10.5 K/uL   RBC 4.11 (L) 4.22 - 5.81 MIL/uL   Hemoglobin 13.6 13.0 - 17.0 g/dL   HCT 51.7 (L) 39 - 52 %   MCV 92.2 80.0 - 100.0 fL   MCH 33.1 26.0 - 34.0 pg   MCHC 35.9 30.0 - 36.0 g/dL   RDW 00.1 74.9 - 44.9 %   Platelets 338 150 - 400 K/uL   nRBC 0.0 0.0 - 0.2 %    Comment: Performed at [facility], [address] [place], [place] [address]  Blood culture (  routine x 2)     Status: None (Preliminary result)   Collection Time: 04/05/20  6:57 PM   Specimen: BLOOD  Result Value Ref Range   Specimen Description BLOOD BLOOD RIGHT HAND    Special Requests      BOTTLES DRAWN AEROBIC AND ANAEROBIC Blood Culture adequate volume   Culture      NO GROWTH < 12 HOURS Performed at [facility], [address] [place], [place] [address]    Report Status PENDING   Blood culture (routine x 2)     Status: None (Preliminary result)   Collection Time: 04/05/20  6:57 PM   Specimen: BLOOD  Result Value Ref Range   Specimen Description BLOOD RIGHT ANTECUBITAL    Special Requests      BOTTLES DRAWN AEROBIC AND ANAEROBIC Blood Culture adequate volume   Culture      NO GROWTH < 12 HOURS Performed at [facility], [address], [place], [place] [address]    Report Status PENDING   SARS Coronavirus 2 by RT PCR (hospital order, performed in [facility] hospital lab) Nasopharyngeal Nasopharyngeal Swab     Status: None   Collection Time: 04/05/20  6:57 PM   Specimen: Nasopharyngeal Swab  Result Value Ref Range   SARS Coronavirus 2 NEGATIVE NEGATIVE    Comment: (NOTE) SARS-CoV-2 target nucleic acids are NOT DETECTED.  The SARS-CoV-2 RNA is generally detectable in upper and lower respiratory specimens during the acute phase of infection. The lowest concentration of SARS-CoV-2 viral copies this assay can detect  is 250 copies / mL. A negative result does not preclude SARS-CoV-2 infection and should not be used as the sole basis for treatment or other patient management decisions.  A negative result may occur with improper specimen collection / handling, submission of specimen other than nasopharyngeal swab, presence of viral mutation(s) within the areas targeted by this assay, and inadequate number of viral copies (<250 copies / mL). A negative result must be combined with clinical observations, patient history, and epidemiological information.  Fact Sheet for Patients:   BoilerBrush.com.cy  Fact Sheet for Healthcare Providers: https://pope.com/  This test is not yet approved or  cleared by the [place] FDA and has been authorized for detection and/or diagnosis of SARS-CoV-2 by FDA under an Emergency Use Authorization (EUA).  This EUA will remain in effect (meaning this test can be used) for the duration of the COVID-19 declaration under Section 564(b)(1) of the Act, 21 U.S.C. section 360bbb-3(b)(1), unless the authorization is terminated or revoked sooner.  Performed at [facility], [address] [place], [place] [address]   Glucose, capillary     Status: None   Collection Time: 04/06/20 12:37 AM  Result Value Ref Range   Glucose-Capillary 84 70 - 99 mg/dL    Comment: Glucose reference range applies only to samples taken after fasting for at least 8 hours.  Glucose, capillary     Status: None   Collection Time: 04/06/20 12:37 AM  Result Value Ref Range   Glucose-Capillary 84 70 - 99 mg/dL    Comment: Glucose reference range applies only to samples taken after fasting for at least 8 hours.  HIV Antibody (routine testing w rflx)     Status: None   Collection Time: 04/06/20  4:23 AM  Result Value Ref Range   HIV Screen 4th Generation wRfx Non Reactive Non Reactive    Comment: Performed at [facility], [address] [place],  [place] [address]  Comprehensive metabolic panel     Status: Abnormal   Collection Time: 04/06/20  4:23 AM  Result Value Ref Range   Sodium 133 (L) 135 - 145 mmol/L   Potassium 4.4 3.5 - 5.1 mmol/L   Chloride 104 98 - 111 mmol/L   CO2 21 (L) 22 - 32 mmol/L   Glucose, Bld 58 (L) 70 - 99 mg/dL    Comment: Glucose reference range applies only to samples taken after fasting for at least 8 hours.   BUN 33 (H) 6 - 20 mg/dL   Creatinine, Ser 0.35 (H) 0.61 - 1.24 mg/dL   Calcium 9.7 8.9 - 59.7 mg/dL   Total Protein 6.6 6.5 - 8.1 g/dL   Albumin 3.5 3.5 - 5.0 g/dL   AST 12 (L) 15 - 41 U/L   ALT 16 0 - 44 U/L   Alkaline Phosphatase 48 38 - 126 U/L   Total Bilirubin 0.9 0.3 - 1.2 mg/dL   GFR calc non Af Amer 60 (L) >60 mL/min   GFR calc Af Amer >60 >60 mL/min   Anion gap 8 5 - 15    Comment: Performed at [facility], [address] [place], [place] [address]  CBC     Status: Abnormal   Collection Time: 04/06/20  4:23 AM  Result Value Ref Range   WBC 10.0 4.0 - 10.5 K/uL   RBC 3.43 (L) 4.22 - 5.81 MIL/uL   Hemoglobin 11.0 (L) 13.0 - 17.0 g/dL   HCT 45.3 (L) 39 - 52 %   MCV 95.9 80.0 - 100.0 fL   MCH 32.1 26.0 - 34.0 pg   MCHC 33.4 30.0 - 36.0 g/dL   RDW 64.6 80.3 - 21.2 %   Platelets 296 150 - 400 K/uL   nRBC 0.0 0.0 - 0.2 %    Comment: Performed at [facility], [address] [place], [place] [address]  Hemoglobin A1c     Status: Abnormal   Collection Time: 04/06/20  4:23 AM  Result Value Ref Range   Hgb A1c MFr Bld 7.3 (H) 4.8 - 5.6 %    Comment: (NOTE) Pre diabetes:          5.7%-6.4%  Diabetes:              >6.4%  Glycemic control for   <7.0% adults with diabetes    Mean Plasma Glucose 162.81 mg/dL    Comment: Performed at [facility], [address], [place], [place] [address]  Glucose, capillary     Status: Abnormal   Collection Time: 04/06/20  7:56 AM  Result Value Ref Range   Glucose-Capillary 66 (L) 70 - 99 mg/dL     Comment: Glucose reference range applies only to samples taken after fasting for at least 8 hours.  Glucose, capillary     Status: Abnormal   Collection Time: 04/06/20  7:56 AM  Result Value Ref Range   Glucose-Capillary 66 (L) 70 - 99 mg/dL    Comment: Glucose reference range applies only to samples taken after fasting for at least 8 hours.  Glucose, capillary     Status: Abnormal   Collection Time: 04/06/20  8:32 AM  Result Value Ref Range   Glucose-Capillary 109 (H) 70 - 99 mg/dL    Comment: Glucose reference range applies only to samples taken after fasting for at least 8 hours.  Glucose, capillary     Status: Abnormal   Collection Time: 04/06/20  8:32  AM  Result Value Ref Range   Glucose-Capillary 109 (H) 70 - 99 mg/dL    Comment: Glucose reference range applies only to samples taken after fasting for at least 8 hours.   DG Foot Complete Left  Result Date: 04/05/2020 CLINICAL DATA:  Cellulitis, ulcer to the great toe EXAM: LEFT FOOT - COMPLETE 3+ VIEW COMPARISON:  12/04/2013 FINDINGS: No acute displaced fracture or malalignment. Old fracture deformities of the third fourth and fifth distal metatarsals as before. Chronic erosive change at the head of the second metatarsal. Gas in the soft tissues at the first IP joint likely due to ulcer. No underlying bony destructive change. Large plantar calcaneal spur. Vascular calcifications. IMPRESSION: 1. Mild gas in the soft tissues at the first IP joint likely due to ulcer. No definite radiographic evidence for osteomyelitis. MRI follow-up should be considered if persistent clinical concern for osteomyelitis. 2. Chronic deformities at the third through fifth metatarsals. Electronically Signed   By: Jasmine Pang M.D.   On: 04/05/2020 19:46    Blood pressure (!) 145/76, pulse 61, temperature 98 F (36.7 C), resp. rate 18, height 6\' 1"  (1.854 m), weight 111.1 kg, SpO2 100 %.  Assessment 1. Left hallux gangrenous necrosis associated  cellulitis 2. Left hallux osteomyelitis 3. Diabetes type 2 polyneuropathy 4. Peripheral vascular disease  Plan -Patient was seen and examined on 04/05/2020 in clinic.  Patient was seen with Dr. 06/05/2020 and myself in office setting. -X-rays reviewed - patient appears to have some subcutaneous gas present at the level ulceration but the ulceration appears to be open and some of it that is present is likely due to area from the ulcer.  No obvious osteomyelitis present but ulcer appears to open to bone. -Ulceration appears to be necrotic the level of bone consistent with osteomyelitic and gangrenous changes. -Recommend betadine wet to dry dressings daily to the L hallux with minimal compression. -Consult placed to vascular surgery for revascularization of the left lower extremity.  Once this was performed we will likely proceed with amputation at that time consisting of left hallux amputation versus partial first ray amputation. Patient made aware of this. -Appreciate recommendations per medicine for antibiotic therapy.  Recommend continued IV antibiotics.  Wound culture growing gram-positive cocci and gram-negative rods, pending. -WBC trending downward from 17 to 10 today. -We will likely plan for amputation on Thursday.  Will reevaluate patient tomorrow.  Sunday, DPM 04/06/2020, 11:53 AM

## 2020-04-06 NOTE — Plan of Care (Signed)
The patient has been discharged to SNF. No falls. IV has been removed. The patient was transfer via EMS. AVS paper work has discussed via phone with SNF nurse Jeani Hawking. Vital sings stable upon discharge. Problem: Education: Goal: Knowledge of General Education information will improve Description: Including pain rating scale, medication(s)/side effects and non-pharmacologic comfort measures Outcome: Completed/Met   Problem: Health Behavior/Discharge Planning: Goal: Ability to manage health-related needs will improve Outcome: Completed/Met   Problem: Clinical Measurements: Goal: Ability to maintain clinical measurements within normal limits will improve Outcome: Completed/Met Goal: Will remain free from infection Outcome: Completed/Met Goal: Diagnostic test results will improve Outcome: Completed/Met Goal: Respiratory complications will improve Outcome: Completed/Met Goal: Cardiovascular complication will be avoided Outcome: Completed/Met   Problem: Activity: Goal: Risk for activity intolerance will decrease Outcome: Completed/Met   Problem: Nutrition: Goal: Adequate nutrition will be maintained Outcome: Completed/Met   Problem: Coping: Goal: Level of anxiety will decrease Outcome: Completed/Met   Problem: Elimination: Goal: Will not experience complications related to bowel motility Outcome: Completed/Met Goal: Will not experience complications related to urinary retention Outcome: Completed/Met   Problem: Pain Managment: Goal: General experience of comfort will improve Outcome: Completed/Met   Problem: Safety: Goal: Ability to remain free from injury will improve Outcome: Completed/Met   Problem: Skin Integrity: Goal: Risk for impaired skin integrity will decrease Outcome: Completed/Met

## 2020-04-06 NOTE — Progress Notes (Signed)
PROGRESS NOTE    Marc [name]  SPQ:[id number] DOB: May 03, 1961 DOA: 04/05/2020 PCP: [facility]   Chief complaint.  Left great toe pain.  Brief Narrative: Marc [name] is a 59 y.o. male with medical history significant of diabetes, systolic dysfunction CHF, hypertension, hyperlipidemia, medication noncompliance, coronary artery disease, ongoing tobacco abuse as well as previous diabetic foot ulcer with amputation on the right toe the great 1 who is being followed by podiatry for a similar left toe also around the big toe. X-ray of the toe showed possible osteomyelitis.  He was started on cefepime and vancomycin.  8/3.  Consult is obtained from orthopedics and vascular surgeon.  Scheduled angiogram of the leg on 8/4.  Probably toe amputation on Friday.   Assessment & Plan:   Principal Problem:   Cellulitis and abscess of toe of left foot Active Problems:   Chronic systolic heart failure (HCC)   Hypertension   Hyperlipidemia   PAF (paroxysmal atrial fibrillation) ([place])   Coronary artery disease   Diabetes ([place])  #1.  Left great toe cellulitis, abscess and osteomyelitis. Continue current antibiotics.  Scheduled vascular procedure and potential amputation.  #2.  Type 2 diabetes uncontrolled with hypoglycemia. Hemoglobin A1c a year ago was at 9.0.  Recheck A1c pending.. Patient had hypoglycemia episode this morning, he is not currently on scheduled Lantus or oral diabetic medicines.  He was on Lantus 52-62 units daily at home.  We will continue to monitor.  Restart Lantus when glucose running high again.  3.  Paroxysmal atrial fibrillation. Not chronically on anticoagulation.  4.  Chronic kidney disease stage IIIa. Renal function still stable.  5.  Chronic systolic congestive heart failure. Currently stable, no exacerbation.  6.  Essential hypertension. Continue some home medicines.  7.  Mild hyponatremia. No evidence of dehydration, probably  SIADH.   DVT prophylaxis: Lovenox Code Status:Full Family Communication: None Disposition Plan:  . Patient came from: Home            . Anticipated d/c place: Home . Barriers to d/c OR conditions which need to be met to effect a safe d/c:   Consultants:   Podiatry and vascular surgery.  Procedures: Pending. Antimicrobials:  Cefepime and vancomycin.  Subjective: Patient is doing well today, he had some hypoglycemia this morning, received orange juice.  Feel better now. Denies any short of breath or cough. No fever or chills. No abdominal pain or nausea or diarrhea.  Objective: Vitals:   04/05/20 1935 04/05/20 2000 04/05/20 2108 04/06/20 0547  BP: 116/72 95/66 119/67 113/66  Pulse: 66 66 62 60  Resp:  18 18 18   Temp:   98.3 F (36.8 C) 98.6 F (37 C)  TempSrc:   Oral Oral  SpO2: 96% 97% 99% 96%  Weight:      Height:        Intake/Output Summary (Last 24 hours) at 04/06/2020 0846 Last data filed at 04/06/2020 0500 Gross per 24 hour  Intake 494.46 ml  Output 500 ml  Net -5.54 ml   Filed Weights   04/05/20 1659  Weight: 111.1 kg    Examination:  General exam: Appears calm and comfortable  Respiratory system: Clear to auscultation. Respiratory effort normal. Cardiovascular system: S1 & S2 heard, RRR. No JVD, murmurs, rubs, gallops or clicks. No pedal edema. Gastrointestinal system: Abdomen is nondistended, soft and nontender. No organomegaly or masses felt. Normal bowel sounds heard. Central nervous system: Alert and oriented. No focal neurological deficits. Extremities: Left  great toe swelling. Skin: No rashes, lesions or ulcers Psychiatry: Judgement and insight appear normal. Mood & affect appropriate.     Data Reviewed: I have personally reviewed following labs and imaging studies  CBC: Recent Labs  Lab 04/05/20 1741 04/06/20 0423  WBC 17.3* 10.0  HGB 13.6 11.0*  HCT 37.9* 32.9*  MCV 92.2 95.9  PLT 338 423   Basic Metabolic Panel: Recent Labs   Lab 04/05/20 1741 04/06/20 0423  NA 133* 133*  K 4.9 4.4  CL 103 104  CO2 21* 21*  GLUCOSE 73 58*  BUN 34* 33*  CREATININE 1.40* 1.29*  CALCIUM 10.9* 9.7   GFR: Estimated Creatinine Clearance: 79.6 mL/min (A) (by C-G formula based on SCr of 1.29 mg/dL (H)). Liver Function Tests: Recent Labs  Lab 04/06/20 0423  AST 12*  ALT 16  ALKPHOS 48  BILITOT 0.9  PROT 6.6  ALBUMIN 3.5   No results for input(s): LIPASE, AMYLASE in the last 168 hours. No results for input(s): AMMONIA in the last 168 hours. Coagulation Profile: No results for input(s): INR, PROTIME in the last 168 hours. Cardiac Enzymes: No results for input(s): CKTOTAL, CKMB, CKMBINDEX, TROPONINI in the last 168 hours. BNP (last 3 results) No results for input(s): PROBNP in the last 8760 hours. HbA1C: No results for input(s): HGBA1C in the last 72 hours. CBG: Recent Labs  Lab 04/06/20 0037 04/06/20 0756  GLUCAP 84 66*   Lipid Profile: No results for input(s): CHOL, HDL, LDLCALC, TRIG, CHOLHDL, LDLDIRECT in the last 72 hours. Thyroid Function Tests: No results for input(s): TSH, T4TOTAL, FREET4, T3FREE, THYROIDAB in the last 72 hours. Anemia Panel: No results for input(s): VITAMINB12, FOLATE, FERRITIN, TIBC, IRON, RETICCTPCT in the last 72 hours. Sepsis Labs: No results for input(s): PROCALCITON, LATICACIDVEN in the last 168 hours.  Recent Results (from the past 240 hour(s))  Aerobic/Anaerobic Culture (surgical/deep wound)     Status: None (Preliminary result)   Collection Time: 04/05/20  4:54 PM   Specimen: Ulcer  Result Value Ref Range Status   Specimen Description   Final    ULCER Performed at [facility], [address], [place], [place] [address]    Special Requests   Final    NONE Performed at [facility], [address] [place], [place] [address]    Gram Stain   Final    FEW WBC PRESENT, PREDOMINANTLY PMN MODERATE GRAM POSITIVE COCCI FEW GRAM NEGATIVE RODS Performed  at [facility], [place] [address], [place], [place] [address]    Culture PENDING  Incomplete   Report Status PENDING  Incomplete  Blood culture (routine x 2)     Status: None (Preliminary result)   Collection Time: 04/05/20  6:57 PM   Specimen: BLOOD  Result Value Ref Range Status   Specimen Description BLOOD BLOOD RIGHT HAND  Final   Special Requests   Final    BOTTLES DRAWN AEROBIC AND ANAEROBIC Blood Culture adequate volume   Culture   Final    NO GROWTH < 12 HOURS Performed at [facility], [address] [place], [place] [address]    Report Status PENDING  Incomplete  Blood culture (routine x 2)     Status: None (Preliminary result)   Collection Time: 04/05/20  6:57 PM   Specimen: BLOOD  Result Value Ref Range Status   Specimen Description BLOOD RIGHT ANTECUBITAL  Final   Special Requests   Final    BOTTLES DRAWN AEROBIC AND ANAEROBIC  Blood Culture adequate volume   Culture   Final    NO GROWTH < 12 HOURS Performed at [facility], [address] [place], [place] [address]    Report Status PENDING  Incomplete  SARS Coronavirus 2 by RT PCR (hospital order, performed in [facility] hospital lab) Nasopharyngeal Nasopharyngeal Swab     Status: None   Collection Time: 04/05/20  6:57 PM   Specimen: Nasopharyngeal Swab  Result Value Ref Range Status   SARS Coronavirus 2 NEGATIVE NEGATIVE Final    Comment: (NOTE) SARS-CoV-2 target nucleic acids are NOT DETECTED.  The SARS-CoV-2 RNA is generally detectable in upper and lower respiratory specimens during the acute phase of infection. The lowest concentration of SARS-CoV-2 viral copies this assay can detect is 250 copies / mL. A negative result does not preclude SARS-CoV-2 infection and should not be used as the sole basis for treatment or other patient management decisions.  A negative result may occur with improper specimen collection / handling, submission of specimen other than nasopharyngeal swab,  presence of viral mutation(s) within the areas targeted by this assay, and inadequate number of viral copies (<250 copies / mL). A negative result must be combined with clinical observations, patient history, and epidemiological information.  Fact Sheet for Patients:   StrictlyIdeas.no  Fact Sheet for Healthcare Providers: BankingDealers.co.za  This test is not yet approved or  cleared by the [place] FDA and has been authorized for detection and/or diagnosis of SARS-CoV-2 by FDA under an Emergency Use Authorization (EUA).  This EUA will remain in effect (meaning this test can be used) for the duration of the COVID-19 declaration under Section 564(b)(1) of the Act, 21 U.S.C. section 360bbb-3(b)(1), unless the authorization is terminated or revoked sooner.  Performed at [facility], [address], [place], [place] [address]          Radiology Studies: DG Foot Complete Left  Result Date: 04/05/2020 CLINICAL DATA:  Cellulitis, ulcer to the great toe EXAM: LEFT FOOT - COMPLETE 3+ VIEW COMPARISON:  12/04/2013 FINDINGS: No acute displaced fracture or malalignment. Old fracture deformities of the third fourth and fifth distal metatarsals as before. Chronic erosive change at the head of the second metatarsal. Gas in the soft tissues at the first IP joint likely due to ulcer. No underlying bony destructive change. Large plantar calcaneal spur. Vascular calcifications. IMPRESSION: 1. Mild gas in the soft tissues at the first IP joint likely due to ulcer. No definite radiographic evidence for osteomyelitis. MRI follow-up should be considered if persistent clinical concern for osteomyelitis. 2. Chronic deformities at the third through fifth metatarsals. Electronically Signed   By: Donavan Foil M.D.   On: 04/05/2020 19:46        Scheduled Meds: . enoxaparin (LOVENOX) injection  40 mg Subcutaneous Q24H  . insulin aspart  0-15  Units Subcutaneous TID WC  . insulin aspart  0-5 Units Subcutaneous QHS   Continuous Infusions: . sodium chloride 100 mL/hr at 04/06/20 0100  . ceFEPime (MAXIPIME) IV 2 g (04/06/20 0507)  . vancomycin 750 mg (04/06/20 0836)     LOS: 1 day    Time spent: 26 minutes    Sharen Hones, MD [facility]   To contact the attending provider between 7A-7P or the covering provider during after hours 7P-7A, please log into the web site [email or web address] and access using universal [place] password for that web site. If you do not have the password, please call the hospital operator.  04/06/2020, 8:46 AM

## 2020-04-06 NOTE — Consult Note (Signed)
[facility] Vascular Consult Note  MRN : [id number]  Marc [name] is a 59 y.o. (September 12, 1960) male who presents with chief complaint of  Chief Complaint  Patient presents with  . 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  The fat layer was exposed.  Patient has failed to improve in the outpatient setting. Seen in the ER today with worsening of his ulcer appeared infected failing outpatient therapy and is being admitted to the hospital for treatment.  He denied fever or chills.  Patient has significant neuropathy.  Mild pain only.  Patient being admitted to the hospital and will consult podiatry.   Of note: Patient with CAD s/p cardiac bypass  Vascular surgery was consulted by Dr. Chipper Herb for possible endovascular intervention.  Current Facility-Administered Medications  Medication Dose Route Frequency Provider Last Rate Last Admin  . ceFEPIme (MAXIPIME) 2 g in sodium chloride 0.9 % 100 mL IVPB  2 g Intravenous Q8H Garba, Mohammad L, MD 200 mL/hr at 04/06/20 0507 2 g at 04/06/20 0507  . enoxaparin (LOVENOX) injection 40 mg  40 mg Subcutaneous Q24H Earlie Lou L, MD   40 mg at 04/06/20 0509  . HYDROmorphone (DILAUDID) injection 0.5-1 mg  0.5-1 mg Intravenous Q2H PRN Rometta Emery, MD   1 mg at 04/06/20 0833  . insulin aspart (novoLOG) injection 0-15 Units  0-15 Units Subcutaneous TID WC Garba, Mohammad L, MD      . insulin aspart (novoLOG) injection 0-5 Units  0-5 Units Subcutaneous QHS Garba, Mohammad L,  MD      . ondansetron (ZOFRAN) tablet 4 mg  4 mg Oral Q6H PRN Rometta Emery, MD       Or  . ondansetron (ZOFRAN) injection 4 mg  4 mg Intravenous Q6H PRN Earlie Lou L, MD      . vancomycin (VANCOREADY) IVPB 750 mg/150 mL  750 mg Intravenous Q12H Earlie Lou L, MD 150 mL/hr at 04/06/20 0836 750 mg at 04/06/20 0836   Past Medical History:  Diagnosis Date  . Anxiety   . Cardiomyopathy (HCC)    45-50%  . CHF (congestive heart failure) (HCC)   . Chronic systolic heart failure (HCC) 07/2013   Ejection fraction of 45-50% with possible inferior wall hypokinesis  . Coronary artery disease    Significant left main and Severe three-vessel coronary artery disease in December of 2014. He underwent CABG in January of 2015 with LIMA to LAD, sequential SVG to ramus/OM 2 and SVG to OM 3  . Diabetes mellitus without complication (HCC)   . Dysrhythmia   . Hyperlipidemia   . Hypertension   . Medical non-compliance   . MI (myocardial infarction) (HCC)   . Obesity   . Tobacco abuse    Past Surgical History:  Procedure Laterality Date  . AMPUTATION TOE Right 02/29/2016   Procedure: AMPUTATION TOE;  Surgeon: Linus Galas, DPM;  Location: [facility] ORS;  Service: Podiatry;  Laterality: Right;  Great toe  . CARDIAC CATHETERIZATION     [facility] no stent  . CORONARY  ARTERY BYPASS GRAFT N/A 09/16/2013   Procedure: CORONARY ARTERY BYPASS GRAFTING (CABG);  Surgeon: [name], MD;  Location[facility];  Service: Open Heart Surgery;  Laterality: N/A;  CABG x four, using left internal mammary artery and right leg greater saphenous vein  . INTRAOPERATIVE TRANSESOPHAGEAL ECHOCARDIOGRAM N/A 09/16/2013   Procedure: INTRAOPERATIVE TRANSESOPHAGEAL ECHOCARDIOGRAM;  Surgeon: [name], MD;  Location: [facility] OR;  Service: Open Heart Surgery;  Laterality: N/A;  . knee surgery Right   . LASIK Bilateral    Social History Social History   Tobacco Use  . Smoking status: Current Every Day Smoker    Packs/day: 1.00     Years: 45.00    Pack years: 45.00    Types: Cigarettes  . Smokeless tobacco: Never Used  Substance Use Topics  . Alcohol use: No    Alcohol/week: 0.0 standard drinks    Comment: occassional  . Drug use: No   Family History Family History  Problem Relation Age of Onset  . Heart disease Mother   Denies family history of peripheral artery disease, venous disease or renal disease.  No Known Allergies  REVIEW OF SYSTEMS (Negative unless checked)  Constitutional: [] Weight loss  [] Fever  [] Chills Cardiac: [] Chest pain   [] Chest pressure   [] Palpitations   [] Shortness of breath when laying flat   [] Shortness of breath at rest   [] Shortness of breath with exertion. Vascular:  [] Pain in legs with walking   [] Pain in legs at rest   [x] Pain in legs when laying flat   [] Claudication   [x] Pain in feet when walking  [x] Pain in feet at rest  [x] Pain in feet when laying flat   [] History of DVT   [] Phlebitis   [] Swelling in legs   [] Varicose veins   [] Non-healing ulcers Pulmonary:   [] Uses home oxygen   [] Productive cough   [] Hemoptysis   [] Wheeze  [] COPD   [] Asthma Neurologic:  [] Dizziness  [] Blackouts   [] Seizures   [] History of stroke   [] History of TIA  [] Aphasia   [] Temporary blindness   [] Dysphagia   [] Weakness or numbness in arms   [] Weakness or numbness in legs Musculoskeletal:  [] Arthritis   [] Joint swelling   [] Joint pain   [] Low back pain Hematologic:  [] Easy bruising  [] Easy bleeding   [] Hypercoagulable state   [] Anemic  [] Hepatitis Gastrointestinal:  [] Blood in stool   [] Vomiting blood  [] Gastroesophageal reflux/heartburn   [] Difficulty swallowing. Genitourinary:  [] Chronic kidney disease   [] Difficult urination  [] Frequent urination  [] Burning with urination   [] Blood in urine Skin:  [] Rashes   [x] Ulcers   [x] Wounds Psychological:  [] History of anxiety   []  History of major depression.  Physical Examination  Vitals:   04/05/20 1935 04/05/20 2000 04/05/20 2108 04/06/20 0547  BP:  116/72 95/66 119/67 113/66  Pulse: 66 66 62 60  Resp:  18 18 18   Temp:   98.3 F (36.8 C) 98.6 F (37 C)  TempSrc:   Oral Oral  SpO2: 96% 97% 99% 96%  Weight:      Height:       Body mass index is 32.32 kg/m. Gen:  WD/WN, NAD Head: /AT, No temporalis wasting. Prominent temp pulse not noted. Ear/Nose/Throat: Hearing grossly intact, nares w/o erythema or drainage, oropharynx w/o Erythema/Exudate Eyes: Sclera non-icteric, conjunctiva clear Neck: Trachea midline.  No JVD.  Pulmonary:  Good air movement, respirations not labored, equal bilaterally.  Cardiac: RRR, normal S1, S2. Vascular:  Vessel Right Left  Radial Palpable  Palpable  Ulnar Palpable Palpable  Brachial Palpable Palpable  Carotid Palpable, without bruit Palpable, without bruit  Aorta Not palpable N/A  Femoral Palpable Palpable  Popliteal Palpable Palpable  PT Palpable Non-Palpable  DP Palpable Non-Palpable   Left Lower Extremity: Thigh soft.  Calf soft.  Extremities warm distally toes.  Hard to palpate pedal pulses.  Ulceration noted to the great big toe with fat layer exposed.  Gastrointestinal: soft, non-tender/non-distended. No guarding/reflex.  Musculoskeletal: M/S 5/5 throughout.  Extremities without ischemic changes.  No deformity or atrophy. No edema. Neurologic: Sensation grossly intact in extremities.  Symmetrical.  Speech is fluent. Motor exam as listed above. Psychiatric: Judgment intact, Mood & affect appropriate for pt's clinical situation. Dermatologic: As above Lymph : No Cervical, Axillary, or Inguinal lymphadenopathy.  CBC Lab Results  Component Value Date   WBC 10.0 04/06/2020   HGB 11.0 (L) 04/06/2020   HCT 32.9 (L) 04/06/2020   MCV 95.9 04/06/2020   PLT 296 04/06/2020   BMET    Component Value Date/Time   NA 133 (L) 04/06/2020 0423   NA 134 (L) 09/18/2014 1835   K 4.4 04/06/2020 0423   K 4.0 09/18/2014 1835   CL 104 04/06/2020 0423   CL 102 09/18/2014 1835   CO2 21 (L)  04/06/2020 0423   CO2 24 09/18/2014 1835   GLUCOSE 58 (L) 04/06/2020 0423   GLUCOSE 352 (H) 09/18/2014 1835   BUN 33 (H) 04/06/2020 0423   BUN 18 09/18/2014 1835   CREATININE 1.29 (H) 04/06/2020 0423   CREATININE 1.23 09/18/2014 1835   CALCIUM 9.7 04/06/2020 0423   CALCIUM 8.8 09/18/2014 1835   GFRNONAA 60 (L) 04/06/2020 0423   GFRNONAA >60 09/18/2014 1835   GFRNONAA >60 12/04/2013 0953   GFRAA >60 04/06/2020 0423   GFRAA >60 09/18/2014 1835   GFRAA >60 12/04/2013 0953   Estimated Creatinine Clearance: 79.6 mL/min (A) (by C-G formula based on SCr of 1.29 mg/dL (H)).  COAG Lab Results  Component Value Date   INR 1.03 11/14/2015   INR 1.0 09/18/2014   INR 3.3 10/15/2013   Radiology DG Foot Complete Left  Result Date: 04/05/2020 CLINICAL DATA:  Cellulitis, ulcer to the great toe EXAM: LEFT FOOT - COMPLETE 3+ VIEW COMPARISON:  12/04/2013 FINDINGS: No acute displaced fracture or malalignment. Old fracture deformities of the third fourth and fifth distal metatarsals as before. Chronic erosive change at the head of the second metatarsal. Gas in the soft tissues at the first IP joint likely due to ulcer. No underlying bony destructive change. Large plantar calcaneal spur. Vascular calcifications. IMPRESSION: 1. Mild gas in the soft tissues at the first IP joint likely due to ulcer. No definite radiographic evidence for osteomyelitis. MRI follow-up should be considered if persistent clinical concern for osteomyelitis. 2. Chronic deformities at the third through fifth metatarsals. Electronically Signed   By: Jasmine Pang M.D.   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  Atherosclerotic disease with ulceration: Patient with multiple risk factors of atherosclerotic disease.  Status post cardiac bypass in the past.  Patient with nonhealing ulceration to the left foot.  In the setting  of multiple risk factors for atherosclerotic disease, nonhealing wound from previous amputation to the right foot recommend undergoing a left lower extremity angiogram possible intervention assess the patient's anatomy and contributing degree of atherosclerotic disease.  If appropriate, attempt to revascularize leg can be at that time.  Procedure, risks and benefits were explained to the patient.  All questions were answered.  The patient wishes to proceed. Will hydrate in preparation for angiogram tomorrow.  2. Tobacco Abuse: We had a discussion for approximately three minutes regarding the absolute need for smoking cessation due to the deleterious nature of tobacco on the vascular system. We discussed the tobacco use would diminish patency of any intervention, and likely significantly worsen progression of disease. We discussed multiple agents for quitting including replacement therapy or medications to reduce cravings such as Chantix. The patient voices their understanding of the importance of smoking cessation.  3.  Hyperlipidemia: On ASA and statin for medical management Encouraged good control as its slows the progression of atherosclerotic disease  4.  Diabetes: On appropriate medications Encouraged good control as its slows the progression of atherosclerotic disease  Marc Oguinn A Alister Staver, PA-C  04/06/2020 9:34 AM  This note was created with Dragon medical transcription system.  Any error is purely unintentional. 

## 2020-04-07 ENCOUNTER — Encounter
Admission: EM | Disposition: A | Discharge status: Home / Self Care | Payer: Self-pay | Source: Home / Self Care | Attending: Internal Medicine

## 2020-04-07 DIAGNOSIS — I10 Essential (primary) hypertension: Secondary | ICD-10-CM

## 2020-04-07 DIAGNOSIS — E785 Hyperlipidemia, unspecified: Secondary | ICD-10-CM

## 2020-04-07 DIAGNOSIS — I70223 Atherosclerosis of native arteries of extremities with rest pain, bilateral legs: Secondary | ICD-10-CM

## 2020-04-07 DIAGNOSIS — I480 Paroxysmal atrial fibrillation: Secondary | ICD-10-CM

## 2020-04-07 HISTORY — PX: LOWER EXTREMITY ANGIOGRAPHY: CATH118251

## 2020-04-07 LAB — MAGNESIUM: Magnesium: 1.8 mg/dL (ref 1.7–2.4)

## 2020-04-07 LAB — CBC
HCT: 34.1 % — ABNORMAL LOW (ref 39.0–52.0)
Hemoglobin: 11.6 g/dL — ABNORMAL LOW (ref 13.0–17.0)
MCH: 32.8 pg (ref 26.0–34.0)
MCHC: 34 g/dL (ref 30.0–36.0)
MCV: 96.3 fL (ref 80.0–100.0)
Platelets: 299 10*3/uL (ref 150–400)
RBC: 3.54 MIL/uL — ABNORMAL LOW (ref 4.22–5.81)
RDW: 13.1 % (ref 11.5–15.5)
WBC: 9.4 10*3/uL (ref 4.0–10.5)
nRBC: 0 % (ref 0.0–0.2)

## 2020-04-07 LAB — BASIC METABOLIC PANEL
Anion gap: 8 (ref 5–15)
BUN: 29 mg/dL — ABNORMAL HIGH (ref 6–20)
CO2: 20 mmol/L — ABNORMAL LOW (ref 22–32)
Calcium: 9.3 mg/dL (ref 8.9–10.3)
Chloride: 104 mmol/L (ref 98–111)
Creatinine, Ser: 1.21 mg/dL (ref 0.61–1.24)
GFR calc Af Amer: >60 mL/min (ref >60)
GFR calc non Af Amer: >60 mL/min (ref >60)
Glucose, Bld: 177 mg/dL — ABNORMAL HIGH (ref 70–99)
Potassium: 4.8 mmol/L (ref 3.5–5.1)
Sodium: 132 mmol/L — ABNORMAL LOW (ref 135–145)

## 2020-04-07 LAB — GLUCOSE, CAPILLARY
Glucose-Capillary: 164 mg/dL — ABNORMAL HIGH (ref 70–99)
Glucose-Capillary: 129 mg/dL — ABNORMAL HIGH (ref 70–99)
Glucose-Capillary: 119 mg/dL — ABNORMAL HIGH (ref 70–99)

## 2020-04-07 LAB — POCT ACTIVATED CLOTTING TIME: Activated Clotting Time: 158 seconds

## 2020-04-07 LAB — HEPARIN LEVEL (UNFRACTIONATED): Heparin Unfractionated: <0.1 IU/mL — ABNORMAL LOW (ref 0.30–0.70)

## 2020-04-07 SURGERY — LOWER EXTREMITY ANGIOGRAPHY
Anesthesia: Moderate Sedation | Laterality: Left

## 2020-04-07 MED ORDER — CEFAZOLIN SODIUM-DEXTROSE 1-4 GM/50ML-% IV SOLN
1.0000 g | Freq: Once | INTRAVENOUS | Status: AC
  Administered 2020-04-07: 1 g via INTRAVENOUS
  Filled 2020-04-07: qty 50

## 2020-04-07 MED ORDER — MORPHINE SULFATE (PF) 2 MG/ML IV SOLN: 2.0000 mg | INTRAVENOUS | Status: DC | PRN

## 2020-04-07 MED ORDER — CHLORHEXIDINE GLUCONATE 4 % EX LIQD: 60.0000 mL | Freq: Once | CUTANEOUS | Status: DC

## 2020-04-07 MED ORDER — HEPARIN (PORCINE) 25000 UT/250ML-% IV SOLN
1900.0000 [IU]/h | INTRAVENOUS | Status: DC
  Administered 2020-04-08: 1600 [IU]/h via INTRAVENOUS
  Filled 2020-04-07: qty 250

## 2020-04-07 MED ORDER — ONDANSETRON HCL 4 MG/2ML IJ SOLN: 4.0000 mg | Freq: Four times a day (QID) | INTRAMUSCULAR | Status: DC | PRN

## 2020-04-07 MED ORDER — HYDRALAZINE HCL 20 MG/ML IJ SOLN: 5.0000 mg | INTRAMUSCULAR | Status: DC | PRN

## 2020-04-07 MED ORDER — FENTANYL CITRATE (PF) 100 MCG/2ML IJ SOLN
INTRAMUSCULAR | Status: AC
  Filled 2020-04-07: qty 2

## 2020-04-07 MED ORDER — METHYLPREDNISOLONE SODIUM SUCC 125 MG IJ SOLR: 125.0000 mg | Freq: Once | INTRAMUSCULAR | Status: DC | PRN

## 2020-04-07 MED ORDER — ACETAMINOPHEN 325 MG PO TABS: 650.0000 mg | ORAL_TABLET | ORAL | Status: DC | PRN

## 2020-04-07 MED ORDER — HEPARIN SODIUM (PORCINE) 1000 UNIT/ML IJ SOLN
INTRAMUSCULAR | Status: DC | PRN
  Administered 2020-04-07: 3000 [IU] via INTRAVENOUS
  Administered 2020-04-07: 2000 [IU] via INTRAVENOUS
  Administered 2020-04-07: 1000 [IU] via INTRAVENOUS

## 2020-04-07 MED ORDER — MIDAZOLAM HCL 5 MG/5ML IJ SOLN
INTRAMUSCULAR | Status: AC
  Filled 2020-04-07: qty 5

## 2020-04-07 MED ORDER — IODIXANOL 320 MG/ML IV SOLN
INTRAVENOUS | Status: DC | PRN
  Administered 2020-04-07: 100 mL

## 2020-04-07 MED ORDER — SODIUM CHLORIDE 0.9% FLUSH
3.0000 mL | Freq: Two times a day (BID) | INTRAVENOUS | Status: DC
  Administered 2020-04-08 – 2020-04-09 (×3): 3 mL via INTRAVENOUS

## 2020-04-07 MED ORDER — MIDAZOLAM HCL 2 MG/2ML IJ SOLN
INTRAMUSCULAR | Status: DC | PRN
  Administered 2020-04-07 (×2): 1 mg via INTRAVENOUS
  Administered 2020-04-07: 2 mg via INTRAVENOUS
  Administered 2020-04-07: 1 mg via INTRAVENOUS

## 2020-04-07 MED ORDER — MIDAZOLAM HCL 2 MG/ML PO SYRP
8.0000 mg | ORAL_SOLUTION | Freq: Once | ORAL | Status: DC | PRN
  Filled 2020-04-07: qty 4

## 2020-04-07 MED ORDER — SODIUM CHLORIDE 0.9% FLUSH: 3.0000 mL | INTRAVENOUS | Status: DC | PRN

## 2020-04-07 MED ORDER — SODIUM CHLORIDE 0.9 % IV SOLN: 250.0000 mL | INTRAVENOUS | Status: DC | PRN

## 2020-04-07 MED ORDER — HYDROMORPHONE HCL 1 MG/ML IJ SOLN: 1.0000 mg | Freq: Once | INTRAMUSCULAR | Status: DC | PRN

## 2020-04-07 MED ORDER — POVIDONE-IODINE 10 % EX SWAB: 2.0000 "application " | Freq: Once | CUTANEOUS | Status: DC

## 2020-04-07 MED ORDER — LABETALOL HCL 5 MG/ML IV SOLN: 10.0000 mg | INTRAVENOUS | Status: DC | PRN

## 2020-04-07 MED ORDER — SODIUM CHLORIDE 0.9 % IV SOLN: INTRAVENOUS | Status: AC

## 2020-04-07 MED ORDER — FENTANYL CITRATE (PF) 100 MCG/2ML IJ SOLN
INTRAMUSCULAR | Status: DC | PRN
  Administered 2020-04-07 (×4): 50 ug via INTRAVENOUS

## 2020-04-07 MED ORDER — SODIUM CHLORIDE 0.9 % IV SOLN: INTRAVENOUS | Status: DC

## 2020-04-07 MED ORDER — HEPARIN SODIUM (PORCINE) 1000 UNIT/ML IJ SOLN
INTRAMUSCULAR | Status: AC
  Filled 2020-04-07: qty 1

## 2020-04-07 MED ORDER — FAMOTIDINE 20 MG PO TABS: 40.0000 mg | ORAL_TABLET | Freq: Once | ORAL | Status: DC | PRN

## 2020-04-07 MED ORDER — DIPHENHYDRAMINE HCL 50 MG/ML IJ SOLN: 50.0000 mg | Freq: Once | INTRAMUSCULAR | Status: DC | PRN

## 2020-04-07 MED ORDER — ATORVASTATIN CALCIUM 10 MG PO TABS
10.0000 mg | ORAL_TABLET | Freq: Every day | ORAL | Status: DC
  Administered 2020-04-09: 10 mg via ORAL
  Filled 2020-04-07: qty 1

## 2020-04-07 MED ORDER — OXYCODONE HCL 5 MG PO TABS
5.0000 mg | ORAL_TABLET | ORAL | Status: DC | PRN
  Administered 2020-04-07 – 2020-04-08 (×2): 5 mg via ORAL
  Administered 2020-04-08 – 2020-04-09 (×2): 10 mg via ORAL
  Administered 2020-04-09: 5 mg via ORAL
  Administered 2020-04-09 – 2020-04-10 (×2): 10 mg via ORAL
  Filled 2020-04-07 (×7): qty 2

## 2020-04-07 SURGICAL SUPPLY — 40 items
BALLN LUTONIX 4X120X130 (BALLOONS) ×3
BALLN LUTONIX 6X220X130 (BALLOONS) ×3
BALLN LUTONIX AV 8X60X75 (BALLOONS) ×3
BALLN LUTONIX DCB 5X40X130 (BALLOONS) ×3
BALLN LUTONIX DCB 6X100X130 (BALLOONS) ×3
BALLN LUTONIX DCB 7X60X130 (BALLOONS) ×3
BALLOON LUTONIX 4X120X130 (BALLOONS) IMPLANT
BALLOON LUTONIX 6X220X130 (BALLOONS) IMPLANT
BALLOON LUTONIX AV 8X60X75 (BALLOONS) IMPLANT
BALLOON LUTONIX DCB 5X40X130 (BALLOONS) IMPLANT
BALLOON LUTONIX DCB 6X100X130 (BALLOONS) IMPLANT
BALLOON LUTONIX DCB 7X60X130 (BALLOONS) IMPLANT
CATH ANGIO 5F PIGTAIL 65CM (CATHETERS) ×2 IMPLANT
CATH CROSSER S6 154CM (CATHETERS) ×2 IMPLANT
CATH USHER TPER 130CM (CATHETERS) ×2 IMPLANT
CATH VERT 5FR 125CM (CATHETERS) ×2 IMPLANT
DEVICE CLOSURE MYNXGRIP 6/7F (Vascular Products) ×2 IMPLANT
DEVICE PRESTO INFLATION (MISCELLANEOUS) ×2 IMPLANT
DEVICE SAFEGUARD 24CM (GAUZE/BANDAGES/DRESSINGS) ×2 IMPLANT
DEVICE TORQUE .025-.038 (MISCELLANEOUS) ×2 IMPLANT
GLIDEWIRE ADV .014X300CM (WIRE) ×2 IMPLANT
GLIDEWIRE ADV .035X260CM (WIRE) ×2 IMPLANT
GLIDEWIRE ANGLED SS 035X260CM (WIRE) ×2 IMPLANT
GUIDEWIRE PFTE-COATED .018X300 (WIRE) ×2 IMPLANT
KIT FLOWMATE PROCEDURAL (KITS) ×2 IMPLANT
LIFESTENT SOLO 7X200X135 (Permanent Stent) ×2 IMPLANT
NDL ENTRY 21GA 7CM ECHOTIP (NEEDLE) IMPLANT
NEEDLE ENTRY 21GA 7CM ECHOTIP (NEEDLE) ×3 IMPLANT
PACK ANGIOGRAPHY (CUSTOM PROCEDURE TRAY) ×3 IMPLANT
SET INTRO CAPELLA COAXIAL (SET/KITS/TRAYS/PACK) ×2 IMPLANT
SHEATH ANL2 6FRX45 HC (SHEATH) ×2 IMPLANT
SHEATH BRITE TIP 5FRX11 (SHEATH) ×2 IMPLANT
SHEATH BRITE TIP 6FRX11 (SHEATH) ×2 IMPLANT
STENT LIFESTAR 9X60 (Permanent Stent) ×2 IMPLANT
SYR MEDRAD MARK 7 150ML (SYRINGE) ×2 IMPLANT
TUBING CONTRAST HIGH PRESS 72 (TUBING) ×2 IMPLANT
VALVE HEMO TOUHY BORST Y (ADAPTER) ×2 IMPLANT
WIRE G V18X300CM (WIRE) ×2 IMPLANT
WIRE J 3MM .035X145CM (WIRE) ×2 IMPLANT
WIRE MAGIC TORQUE 315CM (WIRE) ×2 IMPLANT

## 2020-04-07 NOTE — Interval H&P Note (Signed)
History and Physical Interval Note:  04/07/2020 10:58 AM  Marc [name]  has presented today for surgery, with the diagnosis of Atherosclerotic Disease With Ulceration.  The various methods of treatment have been discussed with the patient and family. After consideration of risks, benefits and other options for treatment, the patient has consented to  Procedure(s): Lower Extremity Angiography (Left) as a surgical intervention.  The patient's history has been reviewed, patient examined, no change in status, stable for surgery.  I have reviewed the patient's chart and labs.  Questions were answered to the patient's satisfaction.     Levora Dredge

## 2020-04-07 NOTE — Progress Notes (Signed)
Released 20 ml from PAD, HR found by doppler. 20 ml returned to PAD, HR found on doppler.

## 2020-04-07 NOTE — Progress Notes (Signed)
PODIATRY / FOOT AND ANKLE SURGERY PROGRESS NOTE  Requesting Physician: Marrion Coy, MD  Reason for consult: Left hallux ulcer/infection  Chief Complaint: Left big toe sore   HPI: Marc [name] is a 59 y.o. male who presents today resting in bed comfortably.  Patient denies any pain to his left big toe.  Patient is currently awaiting revascularization procedure today with vascular surgery team.  Patient notes that the redness and swelling to the left big toe has gone down since his admission overall.  PMHx:  Past Medical History:  Diagnosis Date  . Anxiety   . Cardiomyopathy (HCC)    45-50%  . CHF (congestive heart failure) (HCC)   . Chronic systolic heart failure (HCC) 07/2013   Ejection fraction of 45-50% with possible inferior wall hypokinesis  . Coronary artery disease    Significant left main and Severe three-vessel coronary artery disease in December of 2014. He underwent CABG in January of 2015 with LIMA to LAD, sequential SVG to ramus/OM 2 and SVG to OM 3  . Diabetes mellitus without complication (HCC)   . Dysrhythmia   . Hyperlipidemia   . Hypertension   . Medical non-compliance   . MI (myocardial infarction) (HCC)   . Obesity   . Tobacco abuse     Surgical Hx:  Past Surgical History:  Procedure Laterality Date  . AMPUTATION TOE Right 02/29/2016   Procedure: AMPUTATION TOE;  Surgeon: Linus Galas, DPM;  Location: [facility] ORS;  Service: Podiatry;  Laterality: Right;  Great toe  . CARDIAC CATHETERIZATION     [facility] no stent  . CORONARY ARTERY BYPASS GRAFT N/A 09/16/2013   Procedure: CORONARY ARTERY BYPASS GRAFTING (CABG);  Surgeon: [name], MD;  Location: [facility] OR;  Service: Open Heart Surgery;  Laterality: N/A;  CABG x four, using left internal mammary artery and right leg greater saphenous vein  . INTRAOPERATIVE TRANSESOPHAGEAL ECHOCARDIOGRAM N/A 09/16/2013   Procedure: INTRAOPERATIVE TRANSESOPHAGEAL ECHOCARDIOGRAM;  Surgeon: [name], MD;  Location: [facility] OR;   Service: Open Heart Surgery;  Laterality: N/A;  . knee surgery Right   . LASIK Bilateral     FHx:  Family History  Problem Relation Age of Onset  . Heart disease Mother     Social History:  reports that he has been smoking cigarettes. He has a 45.00 pack-year smoking history. He has never used smokeless tobacco. He reports that he does not drink alcohol and does not use drugs.  Allergies: No Known Allergies  Review of Systems: General ROS: negative Respiratory ROS: no cough, shortness of breath, or wheezing Cardiovascular ROS: no chest pain or dyspnea on exertion Gastrointestinal ROS: no abdominal pain, change in bowel habits, or black or bloody stools Musculoskeletal ROS: positive for - joint pain and joint swelling Neurological ROS: positive for - numbness/tingling Dermatological ROS: positive for Left hallux ulceration with redness and swelling/purulence  Medications Prior to Admission  Medication Sig Dispense Refill  . amLODipine (NORVASC) 10 MG tablet Take 10 mg by mouth daily. for high blood pressure  2  . aspirin EC 81 MG tablet Take 81 mg by mouth daily. Swallow whole.    Marland Kitchen atorvastatin (LIPITOR) 80 MG tablet Take 1 tablet by mouth every evening.  2  . carvedilol (COREG) 25 MG tablet Take 1 tablet (25 mg total) by mouth 2 (two) times daily. 60 tablet 6  . cloNIDine (CATAPRES) 0.3 MG tablet Take 1 tablet by mouth 2 (two) times daily.  5  . glipiZIDE (GLUCOTROL  XL) 10 MG 24 hr tablet Take 1 tablet by mouth every morning.  2  . LANTUS SOLOSTAR 100 UNIT/ML Solostar Pen Inject 52 Units into the skin every evening. (Patient taking differently: Inject 64 Units into the skin every morning. ) 15 mL 2  . lisinopril (PRINIVIL,ZESTRIL) 40 MG tablet Take 40 mg by mouth daily. for high blood pressure  2  . spironolactone (ALDACTONE) 50 MG tablet Take 100 mg by mouth daily.      Physical Exam: General: Alert and oriented.  No apparent distress.  Vascular: DP/PT pulses +1/faintly  palpable bilateral, appearing diminished.  Mild to moderate erythema and edema present to the left hallux to the first metatarsal phalangeal joint, mildly improved overall.  No hair growth noted to digits both feet  Neuro: Light touch sensation to the lower extremities reduced bilateral.  Derm: Open ulceration to the left IPJ hallux at the dorsal medial aspect with area that probes to bone, purulent drainage expressed, appears to have some early gangrenous changes with dark black necrotic tissue around the periphery of the wound.  Appears to have erythema and edema extending from the wound proximally to the first metatarsal phalangeal joint, mildly improved.  Malodor also noted.  Measures 3 x 2 x 0.5 to bone.    MSK: Previous right hallux amputation.  Minimal to no pain on palpation of the left big toe or foot.  Hammertoes both feet.  Results for orders placed or performed during the hospital encounter of 04/05/20 (from the past 48 hour(s))  Basic metabolic panel     Status: Abnormal   Collection Time: 04/05/20  5:41 PM  Result Value Ref Range   Sodium 133 (L) 135 - 145 mmol/L   Potassium 4.9 3.5 - 5.1 mmol/L   Chloride 103 98 - 111 mmol/L   CO2 21 (L) 22 - 32 mmol/L   Glucose, Bld 73 70 - 99 mg/dL    Comment: Glucose reference range applies only to samples taken after fasting for at least 8 hours.   BUN 34 (H) 6 - 20 mg/dL   Creatinine, Ser 3.61 (H) 0.61 - 1.24 mg/dL   Calcium 22.4 (H) 8.9 - 10.3 mg/dL   GFR calc non Af Amer 54 (L) >60 mL/min   GFR calc Af Amer >60 >60 mL/min   Anion gap 9 5 - 15    Comment: Performed at [facility], [address] [place], [place] [address]  CBC     Status: Abnormal   Collection Time: 04/05/20  5:41 PM  Result Value Ref Range   WBC 17.3 (H) 4.0 - 10.5 K/uL   RBC 4.11 (L) 4.22 - 5.81 MIL/uL   Hemoglobin 13.6 13.0 - 17.0 g/dL   HCT 00.5 (L) 39 - 52 %   MCV 92.2 80.0 - 100.0 fL   MCH 33.1 26.0 - 34.0 pg   MCHC 35.9 30.0 - 36.0 g/dL    RDW 11.0 21.1 - 17.3 %   Platelets 338 150 - 400 K/uL   nRBC 0.0 0.0 - 0.2 %    Comment: Performed at [facility], [address] [place], [place] [address]  Blood culture (routine x 2)     Status: None (Preliminary result)   Collection Time: 04/05/20  6:57 PM   Specimen: BLOOD  Result Value Ref Range   Specimen Description BLOOD BLOOD RIGHT HAND    Special Requests      BOTTLES DRAWN AEROBIC AND ANAEROBIC Blood Culture adequate volume  Culture      NO GROWTH 2 DAYS Performed at [facility], [address] [place], [place] [address]    Report Status PENDING   Blood culture (routine x 2)     Status: None (Preliminary result)   Collection Time: 04/05/20  6:57 PM   Specimen: BLOOD  Result Value Ref Range   Specimen Description BLOOD RIGHT ANTECUBITAL    Special Requests      BOTTLES DRAWN AEROBIC AND ANAEROBIC Blood Culture adequate volume   Culture      NO GROWTH 2 DAYS Performed at [facility], [address] [place], [place] [address]    Report Status PENDING   SARS Coronavirus 2 by RT PCR (hospital order, performed in [facility] hospital lab) Nasopharyngeal Nasopharyngeal Swab     Status: None   Collection Time: 04/05/20  6:57 PM   Specimen: Nasopharyngeal Swab  Result Value Ref Range   SARS Coronavirus 2 NEGATIVE NEGATIVE    Comment: (NOTE) SARS-CoV-2 target nucleic acids are NOT DETECTED.  The SARS-CoV-2 RNA is generally detectable in upper and lower respiratory specimens during the acute phase of infection. The lowest concentration of SARS-CoV-2 viral copies this assay can detect is 250 copies / mL. A negative result does not preclude SARS-CoV-2 infection and should not be used as the sole basis for treatment or other patient management decisions.  A negative result may occur with improper specimen collection / handling, submission of specimen other than nasopharyngeal swab, presence of viral mutation(s) within the areas targeted  by this assay, and inadequate number of viral copies (<250 copies / mL). A negative result must be combined with clinical observations, patient history, and epidemiological information.  Fact Sheet for Patients:   BoilerBrush.com.cy  Fact Sheet for Healthcare Providers: https://pope.com/  This test is not yet approved or  cleared by the [place] FDA and has been authorized for detection and/or diagnosis of SARS-CoV-2 by FDA under an Emergency Use Authorization (EUA).  This EUA will remain in effect (meaning this test can be used) for the duration of the COVID-19 declaration under Section 564(b)(1) of the Act, 21 U.S.C. section 360bbb-3(b)(1), unless the authorization is terminated or revoked sooner.  Performed at [facility], [address] [place], [place] [address]   Glucose, capillary     Status: None   Collection Time: 04/06/20 12:37 AM  Result Value Ref Range   Glucose-Capillary 84 70 - 99 mg/dL    Comment: Glucose reference range applies only to samples taken after fasting for at least 8 hours.  Glucose, capillary     Status: None   Collection Time: 04/06/20 12:37 AM  Result Value Ref Range   Glucose-Capillary 84 70 - 99 mg/dL    Comment: Glucose reference range applies only to samples taken after fasting for at least 8 hours.  HIV Antibody (routine testing w rflx)     Status: None   Collection Time: 04/06/20  4:23 AM  Result Value Ref Range   HIV Screen 4th Generation wRfx Non Reactive Non Reactive    Comment: Performed at [facility], [address] [place], [place] [address]  Comprehensive metabolic panel     Status: Abnormal   Collection Time: 04/06/20  4:23 AM  Result Value Ref Range   Sodium 133 (L) 135 - 145 mmol/L   Potassium 4.4 3.5 - 5.1 mmol/L   Chloride 104 98 - 111 mmol/L   CO2 21 (L) 22 - 32 mmol/L  Glucose, Bld 58 (L) 70 - 99 mg/dL    Comment: Glucose reference range applies only  to samples taken after fasting for at least 8 hours.   BUN 33 (H) 6 - 20 mg/dL   Creatinine, Ser 3.53 (H) 0.61 - 1.24 mg/dL   Calcium 9.7 8.9 - 61.4 mg/dL   Total Protein 6.6 6.5 - 8.1 g/dL   Albumin 3.5 3.5 - 5.0 g/dL   AST 12 (L) 15 - 41 U/L   ALT 16 0 - 44 U/L   Alkaline Phosphatase 48 38 - 126 U/L   Total Bilirubin 0.9 0.3 - 1.2 mg/dL   GFR calc non Af Amer 60 (L) >60 mL/min   GFR calc Af Amer >60 >60 mL/min   Anion gap 8 5 - 15    Comment: Performed at [facility], [address] [place], [place] [address]  CBC     Status: Abnormal   Collection Time: 04/06/20  4:23 AM  Result Value Ref Range   WBC 10.0 4.0 - 10.5 K/uL   RBC 3.43 (L) 4.22 - 5.81 MIL/uL   Hemoglobin 11.0 (L) 13.0 - 17.0 g/dL   HCT 00.8 (L) 39 - 52 %   MCV 95.9 80.0 - 100.0 fL   MCH 32.1 26.0 - 34.0 pg   MCHC 33.4 30.0 - 36.0 g/dL   RDW 67.6 19.5 - 09.3 %   Platelets 296 150 - 400 K/uL   nRBC 0.0 0.0 - 0.2 %    Comment: Performed at [facility], [address] [place], [place] [address]  Hemoglobin A1c     Status: Abnormal   Collection Time: 04/06/20  4:23 AM  Result Value Ref Range   Hgb A1c MFr Bld 7.3 (H) 4.8 - 5.6 %    Comment: (NOTE) Pre diabetes:          5.7%-6.4%  Diabetes:              >6.4%  Glycemic control for   <7.0% adults with diabetes    Mean Plasma Glucose 162.81 mg/dL    Comment: Performed at [facility], [address] [place], [place] [address]  Glucose, capillary     Status: Abnormal   Collection Time: 04/06/20  7:56 AM  Result Value Ref Range   Glucose-Capillary 66 (L) 70 - 99 mg/dL    Comment: Glucose reference range applies only to samples taken after fasting for at least 8 hours.  Glucose, capillary     Status: Abnormal   Collection Time: 04/06/20  7:56 AM  Result Value Ref Range   Glucose-Capillary 66 (L) 70 - 99 mg/dL    Comment: Glucose reference range applies only to samples taken after fasting for at least 8 hours.  Glucose, capillary      Status: Abnormal   Collection Time: 04/06/20  8:32 AM  Result Value Ref Range   Glucose-Capillary 109 (H) 70 - 99 mg/dL    Comment: Glucose reference range applies only to samples taken after fasting for at least 8 hours.  Glucose, capillary     Status: Abnormal   Collection Time: 04/06/20  8:32 AM  Result Value Ref Range   Glucose-Capillary 109 (H) 70 - 99 mg/dL    Comment: Glucose reference range applies only to samples taken after fasting for at least 8 hours.  Glucose, capillary     Status: Abnormal   Collection Time: 04/06/20 11:50 AM  Result Value Ref Range   Glucose-Capillary 134 (H)  70 - 99 mg/dL    Comment: Glucose reference range applies only to samples taken after fasting for at least 8 hours.  Glucose, capillary     Status: Abnormal   Collection Time: 04/06/20  4:47 PM  Result Value Ref Range   Glucose-Capillary 107 (H) 70 - 99 mg/dL    Comment: Glucose reference range applies only to samples taken after fasting for at least 8 hours.  Glucose, capillary     Status: Abnormal   Collection Time: 04/06/20  9:06 PM  Result Value Ref Range   Glucose-Capillary 201 (H) 70 - 99 mg/dL    Comment: Glucose reference range applies only to samples taken after fasting for at least 8 hours.  CBC     Status: Abnormal   Collection Time: 04/07/20  4:35 AM  Result Value Ref Range   WBC 9.4 4.0 - 10.5 K/uL   RBC 3.54 (L) 4.22 - 5.81 MIL/uL   Hemoglobin 11.6 (L) 13.0 - 17.0 g/dL   HCT 14.4 (L) 39 - 52 %   MCV 96.3 80.0 - 100.0 fL   MCH 32.8 26.0 - 34.0 pg   MCHC 34.0 30.0 - 36.0 g/dL   RDW 81.8 56.3 - 14.9 %   Platelets 299 150 - 400 K/uL   nRBC 0.0 0.0 - 0.2 %    Comment: Performed at [facility], [address], [place], [place] [address]  Basic metabolic panel     Status: Abnormal   Collection Time: 04/07/20  6:52 AM  Result Value Ref Range   Sodium 132 (L) 135 - 145 mmol/L   Potassium 4.8 3.5 - 5.1 mmol/L   Chloride 104 98 - 111 mmol/L   CO2 20 (L) 22 - 32 mmol/L    Glucose, Bld 177 (H) 70 - 99 mg/dL    Comment: Glucose reference range applies only to samples taken after fasting for at least 8 hours.   BUN 29 (H) 6 - 20 mg/dL   Creatinine, Ser 7.85 0.61 - 1.24 mg/dL   Calcium 9.3 8.9 - 88.5 mg/dL   GFR calc non Af Amer >60 >60 mL/min   GFR calc Af Amer >60 >60 mL/min   Anion gap 8 5 - 15    Comment: Performed at [facility], [address] [place], [place] [address]  Magnesium     Status: None   Collection Time: 04/07/20  6:52 AM  Result Value Ref Range   Magnesium 1.8 1.7 - 2.4 mg/dL    Comment: Performed at [facility], [address] [place], [place] [address]  Glucose, capillary     Status: Abnormal   Collection Time: 04/07/20  7:53 AM  Result Value Ref Range   Glucose-Capillary 164 (H) 70 - 99 mg/dL    Comment: Glucose reference range applies only to samples taken after fasting for at least 8 hours.   Comment 1 Notify RN    DG Foot Complete Left  Result Date: 04/05/2020 CLINICAL DATA:  Cellulitis, ulcer to the great toe EXAM: LEFT FOOT - COMPLETE 3+ VIEW COMPARISON:  12/04/2013 FINDINGS: No acute displaced fracture or malalignment. Old fracture deformities of the third fourth and fifth distal metatarsals as before. Chronic erosive change at the head of the second metatarsal. Gas in the soft tissues at the first IP joint likely due to ulcer. No underlying bony destructive change. Large plantar calcaneal spur. Vascular calcifications. IMPRESSION: 1. Mild gas in the soft tissues at the first IP joint likely due  to ulcer. No definite radiographic evidence for osteomyelitis. MRI follow-up should be considered if persistent clinical concern for osteomyelitis. 2. Chronic deformities at the third through fifth metatarsals. Electronically Signed   By: Jasmine Pang M.D.   On: 04/05/2020 19:46    Blood pressure (!) 150/74, pulse 62, temperature 97.7 F (36.5 C), temperature source Oral, resp. rate (!) 21, height 6\' 1"  (1.854 m),  weight 111.1 kg, SpO2 98 %.  Assessment 1. Left hallux gangrenous necrosis associated cellulitis 2. Left hallux osteomyelitis 3. Diabetes type 2 polyneuropathy 4. Peripheral vascular disease  Plan -Patient seen and examined today. -X-rays reviewed - patient appears to have some subcutaneous gas present at the level ulceration but the ulceration appears to be open and some of it that is present is likely due to area from the ulcer.  No obvious osteomyelitis present but ulcer appears to open to bone. -Ulceration appears to be necrotic the level of bone consistent with osteomyelitic and gangrenous changes.  Appears to have some improvement in redness and swelling today. -Recommend betadine wet to dry dressings daily to the L hallux with minimal compression. -Appreciate vascular surgery recommendations.  Planning for angiogram with possible intervention today. -Appreciate recommendations per medicine for antibiotic therapy.  Recommend continued IV antibiotics.  Wound culture growing gram-positive cocci and gram-negative rods, pending. -WBC trending downward 9.4 today, afebrile vital signs stable. -Discussed all treatment options with the patient both conservative and surgical attempts at correction including potential risks and complications.  At this time patient is elected for procedure consisting of left hallux amputation versus partial first ray amputation.  Discussed with patient that we will try to save as much of the big toe as possible but if no bleeding occurs or if infection appears to be present then will have to amputate more proximally.  Patient understands.  All questions answered including postoperative course in detail. -Patient to be placed n.p.o. at 8:30am in the morning tomorrow for surgery tomorrow evening around 5 PM.  Will call OR to set up.  , DPM 04/07/2020, 10:35 AM

## 2020-04-07 NOTE — ED Provider Notes (Signed)
[facility] Emergency Department Provider Note  Time seen: 6:47 PM  I have reviewed the triage vital signs and the nursing notes.   HISTORY  Chief Complaint Skin Ulcer   HPI Marc [name] is a 59 y.o. male with a past medical history anxiety, CHF, CAD, diabetes, hypertension, hyperlipidemia, presents to the emergency department for a left great toe infection.  According to the patient he has been seeing his podiatrist, however over the past week or so he has noticed an ulceration developed to his left great toe.  Patient states the ulceration has gotten worse and now he has noted a foul smelling drainage to the area as well as redness to his foot.  Denies any significant discomfort but does state pain at times.  Patient was sent by his podiatrist to the emergency department for admission and IV antibiotics per patient.  Denies any known fever.   Past Medical History:  Diagnosis Date  . Anxiety   . Cardiomyopathy (HCC)    45-50%  . CHF (congestive heart failure) (HCC)   . Chronic systolic heart failure (HCC) 07/2013   Ejection fraction of 45-50% with possible inferior wall hypokinesis  . Coronary artery disease    Significant left main and Severe three-vessel coronary artery disease in December of 2014. He underwent CABG in January of 2015 with LIMA to LAD, sequential SVG to ramus/OM 2 and SVG to OM 3  . Diabetes mellitus without complication (HCC)   . Dysrhythmia   . Hyperlipidemia   . Hypertension   . Medical non-compliance   . MI (myocardial infarction) (HCC)   . Obesity   . Tobacco abuse     Patient Active Problem List   Diagnosis Date Noted  . Cellulitis and abscess of toe of left foot 04/05/2020  . Diabetes (HCC) 03/14/2018  . Blurred vision 03/13/2018  . Cellulitis of toe, right 02/29/2016  . Encounter for therapeutic drug monitoring 09/26/2013  . CAD (coronary artery disease) 09/16/2013  . Coronary artery disease   . PAF (paroxysmal atrial  fibrillation) (HCC) 07/29/2013  . Hypertension   . Hyperlipidemia   . Chronic systolic heart failure (HCC) 07/05/2013    Past Surgical History:  Procedure Laterality Date  . AMPUTATION TOE Right 02/29/2016   Procedure: AMPUTATION TOE;  Surgeon: Linus Galas, DPM;  Location: [facility] ORS;  Service: Podiatry;  Laterality: Right;  Great toe  . CARDIAC CATHETERIZATION     [facility] no stent  . CORONARY ARTERY BYPASS GRAFT N/A 09/16/2013   Procedure: CORONARY ARTERY BYPASS GRAFTING (CABG);  Surgeon: [name], MD;  Location: [facility] OR;  Service: Open Heart Surgery;  Laterality: N/A;  CABG x four, using left internal mammary artery and right leg greater saphenous vein  . INTRAOPERATIVE TRANSESOPHAGEAL ECHOCARDIOGRAM N/A 09/16/2013   Procedure: INTRAOPERATIVE TRANSESOPHAGEAL ECHOCARDIOGRAM;  Surgeon: [name], MD;  Location: [facility] OR;  Service: Open Heart Surgery;  Laterality: N/A;  . knee surgery Right   . LASIK Bilateral     Prior to Admission medications   Medication Sig Start Date End Date Taking? Authorizing Provider  amLODipine (NORVASC) 10 MG tablet Take 10 mg by mouth daily. for high blood pressure 01/31/18  Yes [provider]  aspirin EC 81 MG tablet Take 81 mg by mouth daily. Swallow whole.   Yes [provider]  atorvastatin (LIPITOR) 80 MG tablet Take 1 tablet by mouth every evening. 01/31/18  Yes [provider]  carvedilol (COREG) 25 MG tablet Take  1 tablet (25 mg total) by mouth 2 (two) times daily. 06/26/14  Yes Gollan, Tollie Pizza, MD  cloNIDine (CATAPRES) 0.3 MG tablet Take 1 tablet by mouth 2 (two) times daily. 01/24/18  Yes [provider]  glipiZIDE (GLUCOTROL XL) 10 MG 24 hr tablet Take 1 tablet by mouth every morning. 01/31/18  Yes [provider]  LANTUS SOLOSTAR 100 UNIT/ML Solostar Pen Inject 52 Units into the skin every evening. Patient taking differently: Inject 64 Units into the skin every morning.  03/15/18  Yes Katha Hamming, MD  lisinopril (PRINIVIL,ZESTRIL) 40 MG tablet Take 40 mg by mouth daily. for high blood pressure 01/31/18  Yes [provider]  spironolactone (ALDACTONE) 50 MG tablet Take 100 mg by mouth daily. 03/23/20  Yes [provider]    No Known Allergies  Family History  Problem Relation Age of Onset  . Heart disease Mother     Social History Social History   Tobacco Use  . Smoking status: Current Every Day Smoker    Packs/day: 1.00    Years: 45.00    Pack years: 45.00    Types: Cigarettes  . Smokeless tobacco: Never Used  Substance Use Topics  . Alcohol use: No    Alcohol/week: 0.0 standard drinks    Comment: occassional  . Drug use: No    Review of Systems Constitutional: Negative for fever. Cardiovascular: Negative for chest pain. Respiratory: Negative for shortness of breath. Gastrointestinal: Negative for abdominal pain, vomiting and diarrhea. Musculoskeletal: Status post right great toe amputation, now with an ulceration to the left great toe Skin: Ulceration to the medial aspect of left great toe with necrotic center. Neurological: Negative for headache All other ROS negative  ____________________________________________   PHYSICAL EXAM:  VITAL SIGNS: ED Triage Vitals  Enc Vitals Group     BP 04/05/20 1659 137/69     Pulse Rate 04/05/20 1659 67     Resp 04/05/20 1659 18     Temp 04/05/20 1659 98.7 F (37.1 C)     Temp Source 04/05/20 1659 Oral     SpO2 04/05/20 1659 99 %     Weight 04/05/20 1659 245 lb (111.1 kg)     Height 04/05/20 1659 6\' 1"  (1.854 m)     Head Circumference --      Peak Flow --      Pain Score 04/05/20 1737 6     Pain Loc --      Pain Edu? --      Excl. in GC? --    Constitutional: Alert and oriented. Well appearing and in no distress. Eyes: Normal exam ENT      Head: Normocephalic and atraumatic.      Mouth/Throat: Mucous membranes are moist. Cardiovascular: Normal rate, regular rhythm.  Respiratory:  Normal respiratory effort without tachypnea nor retractions. Breath sounds are clear Gastrointestinal: Soft and nontender. No distention.   Musculoskeletal: Patient has a significant appearing ulceration to the medial aspect of the left great toe and distal foot with necrotic center erythema surrounding the area with mild swelling and purulent drainage. Neurologic:  Normal speech and language. No gross focal neurologic deficits Skin: Left foot ulcer as described above. Psychiatric: Mood and affect are normal.  ____________________________________________   RADIOLOGY  X-ray shows mild gas in the soft tissue no significant findings of osteomyelitis.  ____________________________________________   INITIAL IMPRESSION / ASSESSMENT AND PLAN / ED COURSE  Pertinent labs & imaging results that were available during my care of the  patient were reviewed by me and considered in my medical decision making (see chart for details).   Patient presents with a left foot ulceration with purulent foul-smelling discharge.  Lab work shows elevated white blood cell count.  Patient was sent by his podiatrist.  We will admit the patient to the hospitalist service.  We will start the patient on IV antibiotics.  Patient agreeable to plan of care.  X-ray does not show any definitive signs of osteomyelitis.  Marc [name] was evaluated in Emergency Department on 04/07/2020 for the symptoms described in the history of present illness. He was evaluated in the context of the global COVID-19 pandemic, which necessitated consideration that the patient might be at risk for infection with the SARS-CoV-2 virus that causes COVID-19. Institutional protocols and algorithms that pertain to the evaluation of patients at risk for COVID-19 are in a state of rapid change based on information released by regulatory bodies including the CDC and federal and state organizations. These policies and algorithms were followed during the  patient's care in the ED.  ____________________________________________   FINAL CLINICAL IMPRESSION(S) / ED DIAGNOSES  Foot ulcer   Minna Antis, MD 04/07/20 1850

## 2020-04-07 NOTE — Op Note (Signed)
[facility] Percutaneous Study/Intervention Procedural Note   Date of Surgery: 04/07/2020  Surgeon: Hortencia Pilar  Pre-operative Diagnosis: Atherosclerotic occlusive disease bilateral lower extremities with rest pain symptoms of both lower extremities in association with ulceration of the left lower extremity  Post-operative diagnosis: Same  Procedure(s) Performed: 1. Introduction catheter into left lower extremity 3rd order catheter placement  2. Contrast injection left lower extremity for distal runoff  3. Percutaneous transluminal angioplasty and stent placement of the left external iliac artery             4.  Percutaneous transluminal angioplasty and stent placement of the left superficial femoral artery to 6 mm 4. Percutaneous transluminal angioplasty left tibioperoneal trunk and proximal peroneal to 4 mm    Anesthesia: Conscious sedation was administered under my direct supervision by the interventional radiology RN. IV Versed plus fentanyl were utilized. Continuous ECG, pulse oximetry and blood pressure was monitored throughout the entire procedure.  Conscious sedation was for a total of 2 hour and 40 minutes.  Sheath: 6 [place] Ansell right common femoral artery retrograde  Contrast: 100 cc  Fluoroscopy Time: 27 minutes  Indications: Marc [name] presents with increasing pain of the left lower extremity.  Patient also has ulceration and is requiring debridement in the OR by podiatry.  This suggests the patient is having limb threatening ischemia. The risks and benefits are reviewed all questions answered patient agrees to proceed.  Procedure:Marc [name] is a 59 y.o. y.o. male who was identified and appropriate procedural time out was performed. The patient was then placed supine on the table and prepped and draped in the usual sterile fashion.   Ultrasound was placed in  the sterile sleeve and the right groin was evaluated the right common femoral artery was echolucent and pulsatile indicating patency. Image was recorded for the permanent record and under real-time visualization a microneedle was inserted into the common femoral artery followed by the microwire and then the micro-sheath. A J-wire was then advanced through the micro-sheath and a 5 [place] sheath was then inserted over a J-wire. J-wire was then advanced and a 5 French pigtail catheter was positioned at the level of T12.  AP projection of the aorta was then obtained. Pigtail catheter was repositioned to above the bifurcation and a RAO as well as LAO view of the pelvis was obtained. Subsequently a pigtail catheter with the advantage Glidewire was used to cross the aortic bifurcation the catheter wire were advanced down into the left distal external iliac artery. Oblique view of the femoral bifurcation was then obtained and subsequently the wire was reintroduced and the pigtail catheter negotiated into the SFA representing third order catheter placement. Distal runoff was then performed.  Diagnostic interpretation: The abdominal aorta is opacified with a bolus injection contrast.  There is appear to be single renal arteries no evidence of renal artery stenosis.  The aorta and its proximal segment as well as its mid stent segment demonstrates diffuse disease but no significant lesions.  Distally just above the iliac bifurcation there appears to be a large ulcerated area.  This ulcer in the oblique views does not appear to be associated with a hemodynamically significant narrowing of the aorta itself.  The right common iliac artery demonstrates a diffuse 70% stenosis essentially beginning at the origin and extending to its midportion.  There is diffuse extensive disease of the internal iliac on the right but it is patent.  The right external iliac artery demonstrates multiple  lesions which are greater than 90%.  On  the left the common iliac artery is diffusely diseased but there does not appear to be a hemodynamically significant lesion noted.  In the left external iliac artery there is diffuse atherosclerotic changes with an area in the midportion focally that is greater than 90% in association with this area extending over several centimeters there is a greater than 70% stenosis diffusely.  The left common femoral is diffusely diseased with approximately a 40 to 50% stenosis.  The profunda femoris is diffusely and profoundly diseased with a greater than 70% stenosis at its origin extending distally several centimeters.  The superficial femoral artery is diffusely diseased at its origin and in the midportion extending to approximately the level of Hunter's canal there is a greater than 80% stenosis.  The mid popliteal is patent free of having hemodynamically significant lesions.  Distally there is a greater than 70 % stenosis of the popliteal artery with extent that extends into the tibioperoneal trunk the origin of the peroneal appears to be involved as well.  The anterior tibial and posterior tibial arteries occlude shortly after their origins.  The peroneal remains patent and is the dominant runoff to the foot down to the ankle but it collateralizes poorly to the plantar vessels in the dorsalis pedis.  Anterior tibial does not reconstitute posterior tibial reconstitutes just above the level of the malleolus and fills the lateral and medial plantar vessels.  Also noted the right common femoral is essentially 1 long segment that is a subtotal occlusion  Based on these findings I elected to move forward with intervention.  A total of 6000 units of heparin was then given throughout the procedure and allowed to circulate and a 6 [place] Ansell sheath was advanced up and over the bifurcation and positioned in the femoral artery  Under fluoroscopic guidance the advantage wire was negotiated down into the mid SFA.   Magnified imaging in the RAO view of the left external iliac artery was obtained.  Initially a 6 mm Lutonix drug-eluting balloon was used to treat the stenosis of the left external iliac artery.  Follow-up imaging demonstrated greater than 50% residual stenosis and a 9 mm x 60 mm life star stent was deployed and then postdilated with a 8 mm x 60 mm Lutonix drug-eluting balloon inflated to 8 atm for 1 minute.  Follow-up imaging demonstrated less than 10% residual stenosis.  The dilator was then advanced under fluoroscopic guidance through the sheath and the sheath and dilator were advanced through the stent and positioned with the tip in the mid common femoral artery.  Kumpe catheter and advantage Glidewire were then negotiated down into the distal popliteal. Catheter was then advanced. Hand injection contrast demonstrated the tibial anatomy in detail, this is described in detail above.    The detector was then repositioned and the SFA was reimaged the 80% stenosis is noted in this area and after appropriate measurements are made a 6 x 100 Lutonix balloon was used to angioplasty the superficial femoral and above-knee popliteal arteries. Inflations were to 10 atmospheres for 1 minute. Follow-up imaging demonstrated patency greater than 50% residual stenosis and therefore a 7 x 200 mm life stent was deployed beginning at [place] canal and deploying it proximally.  It was then postdilated with a 6 mm x 220 mm Lutonix drug-eluting balloon inflated to 8 atm for 1 full minute.  Follow-up imaging now demonstrated less than 10% residual stenosis.  The Kumpe catheter was then  readvanced and magnified imaging of the trifurcation was obtained.  The next 15+ minutes of fluoroscopy time was used trying to negotiate wires and catheters into the posterior tibial with the hope of doing an atherectomy and recanalizing the posterior tibial its entirety based on the outflow of the foot.  This was not successful at this time and  I then renegotiated the wire into the peroneal.  The wire was then exchanged for a 0.035 advantage wire and a 4 mm x 100 mm Lutonix drug-eluting balloon and later a 5 mm x 40 mm Lutonix drug-eluting balloon was used to treat the distalmost popliteal and the tibioperoneal trunk in its entirety as well as the proximal peroneal.  Follow-up imaging demonstrated less than 5% residual stenosis.  After review of these images the sheath is pulled into the right external iliac oblique of the common femoral is obtained and a and pressure. There no immediate Complications.  Findings:  The abdominal aorta is opacified with a bolus injection contrast.  There is appear to be single renal arteries no evidence of renal artery stenosis.  The aorta and its proximal segment as well as its mid stent segment demonstrates diffuse disease but no significant lesions.  Distally just above the iliac bifurcation there appears to be a large ulcerated area.  This ulcer in the oblique views does not appear to be associated with a hemodynamically significant narrowing of the aorta itself.  The right common iliac artery demonstrates a diffuse 70% stenosis essentially beginning at the origin and extending to its midportion.  There is diffuse extensive disease of the internal iliac on the right but it is patent.  The right external iliac artery demonstrates multiple lesions which are greater than 90%.  On the left the common iliac artery is diffusely diseased but there does not appear to be a hemodynamically significant lesion noted.  In the left external iliac artery there is diffuse atherosclerotic changes with an area in the midportion focally that is greater than 90% in association with this area extending over several centimeters there is a greater than 70% stenosis diffusely.  The left common femoral is diffusely diseased with approximately a 40 to 50% stenosis.  The profunda femoris is diffusely and profoundly diseased with a  greater than 70% stenosis at its origin extending distally several centimeters.  The superficial femoral artery is diffusely diseased at its origin and in the midportion extending to approximately the level of Hunter's canal there is a greater than 80% stenosis.  The mid popliteal is patent free of having hemodynamically significant lesions.  Distally there is a greater than 70 % stenosis of the popliteal artery with extent that extends into the tibioperoneal trunk the origin of the peroneal appears to be involved as well.  The anterior tibial and posterior tibial arteries occlude shortly after their origins.  The peroneal remains patent and is the dominant runoff to the foot down to the ankle but it collateralizes poorly to the plantar vessels in the dorsalis pedis.  Anterior tibial does not reconstitute posterior tibial reconstitutes just above the level of the malleolus and fills the lateral and medial plantar vessels.  Also noted the right common femoral is essentially 1 long segment that is a subtotal occlusion   Following angioplasty of the peroneal and tibioperoneal trunk there is now is in-line flow and looks quite nice. Angioplasty and stent placement of the SFA at Hunter's canal yields an excellent result with less than 10% residual stenosis.  Angioplasty and  stent placement of the left external iliac artery yields an excellent result with less than 10% residual stenosis  Summary: Successful recanalization left lower extremity for limb salvage.  We have successfully recanalized multilevel lesions including the iliac SFA and tibial.  However, based on his open wounds and the distalmost outflow of the foot recanalization of the posterior tibial would be extremely helpful for wound healing.  Based on this finding I will plan to move forward with a direct access of the left posterior tibial with retrograde treatment of the occlusion on Friday.  In the meantime we can move forward with his debridement  tomorrow given the success of the above-noted angio.   Disposition: Patient was taken to the recovery room in stable condition having tolerated the procedure well.  Belenda Cruise Curtistine Pettitt 04/07/2020,1:35 PM

## 2020-04-07 NOTE — Consult Note (Signed)
ANTICOAGULATION CONSULT NOTE  Pharmacy Consult for heparin infusion Indication: peripheral intervention  No Known Allergies  Patient Measurements: Height: 6\' 1"  (185.4 cm) Weight: 111.1 kg (245 lb) IBW/kg (Calculated) : 79.9 Heparin Dosing Weight: 103.3 kg  Vital Signs: Temp: 97.7 F (36.5 C) (08/04 1026) Temp Source: Oral (08/04 1026) BP: 125/70 (08/04 1313) Pulse Rate: 62 (08/04 1026)  Labs: Recent Labs    04/05/20 1741 04/05/20 1741 04/06/20 0423 04/07/20 0435 04/07/20 0652  HGB 13.6   < > 11.0* 11.6*  --   HCT 37.9*  --  32.9* 34.1*  --   PLT 338  --  296 299  --   CREATININE 1.40*  --  1.29*  --  1.21   < > = values in this interval not displayed.    Estimated Creatinine Clearance: 84.8 mL/min (by C-G formula based on SCr of 1.21 mg/dL).   Medical History: Past Medical History:  Diagnosis Date  . Anxiety   . Cardiomyopathy (HCC)    45-50%  . CHF (congestive heart failure) (HCC)   . Chronic systolic heart failure (HCC) 07/2013   Ejection fraction of 45-50% with possible inferior wall hypokinesis  . Coronary artery disease    Significant left main and Severe three-vessel coronary artery disease in December of 2014. He underwent CABG in January of 2015 with LIMA to LAD, sequential SVG to ramus/OM 2 and SVG to OM 3  . Diabetes mellitus without complication (HCC)   . Dysrhythmia   . Hyperlipidemia   . Hypertension   . Medical non-compliance   . MI (myocardial infarction) (HCC)   . Obesity   . Tobacco abuse     Medications:  Scheduled:  . [MAR Hold] amLODipine  10 mg Oral Daily  . [MAR Hold] carvedilol  25 mg Oral BID WC  . [MAR Hold] cloNIDine  0.3 mg Oral BID  . [MAR Hold] enoxaparin (LOVENOX) injection  40 mg Subcutaneous Q24H  . fentaNYL      . fentaNYL      . heparin sodium (porcine)      . [MAR Hold] insulin aspart  0-15 Units Subcutaneous TID WC  . [MAR Hold] insulin aspart  0-5 Units Subcutaneous QHS  . midazolam        Assessment: 58  year oldmalewith PMH of diabetes, systolic dysfunction CHF, hypertension, hyperlipidemia, medication noncompliance, coronary artery disease, ongoing tobacco abuse as well as previous diabetic foot ulcer with amputation on the great right toe. He was seen in the ER with worsening of his ulcer and underwent a LLE angiogram this afternoon. He was on no chronic AC prior to this admission. H&H lower than baseline but appears to be stable at this time  Goal of Therapy:  Heparin level 0.3-0.7 units/ml Monitor platelets by anticoagulation protocol: Yes   Plan:   Start heparin infusion at 1600 units/hr (no bolus per MD order) at 1430  Check anti-Xa level in 8 hours and daily while on heparin  Continue to monitor H&H and platelets  CBC in am  04[birth year]1986 04/07/2020,1:46 PM

## 2020-04-07 NOTE — Progress Notes (Signed)
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 X-ray of the toe showed possible osteomyelitis.  He was started on cefepime and vancomycin.  Patient admitted for further management.    Today, saw patient after procedure, complaining about his medications especially blood pressure medications being held.  Denied any other new complaints   Assessment/Plan: Principal Problem:   Cellulitis and abscess of toe of left foot Active Problems:   Chronic systolic heart failure (HCC)   Hypertension   Hyperlipidemia   PAF (paroxysmal atrial fibrillation) (HCC)   Coronary artery disease   Diabetes (HCC)   Left great toe diabetic foot ulcer Peripheral vascular disease S/p left extremity angiogram/PVD Currently afebrile, with resolving leukocytosis X-ray left foot showed mild gas in the soft tissues at the first IP joint likely due to ulcer, no definitive radiographic evidence of osteomyelitis Patient has been followed by podiatry as an outpatient, podiatry consulted, recommend daily wet-to-dry dressings, and plan for amputation around 5 PM on 04/08/2020 Vascular surgery on board s/p left lower extremity angiogram, was able to improve arterial flow, but will go back again on Friday via a pedal approach  Continue heparin drip Continue statins Continue IV cefepime, vancomycin  Diabetes mellitus type 2, uncontrolled with hyperglycemia A1c 7.3 SSI, Accu-Cheks, hypoglycemic protocol  Hypertension BP stable Continue  Coreg, amlodipine, clonidine  CKD stage IIIa with metabolic acidosis Creatinine at baseline Daily BMP  Chronic systolic HF Appears euvolemic Continue Coreg  Paroxysmal A. Fib Heart rate controlled Continue Coreg Not on any anticoagulation at home, currently on heparin drip          Malnutrition Type:      Malnutrition Characteristics:      Nutrition Interventions:       Estimated body mass index is 32.32 kg/m as calculated from the following:   Height as of this encounter: 6\' 1"  (1.854 m).   Weight as of this encounter: 111.1 kg.     Code Status: Full  Family Communication: None at bedside  Disposition Plan: Status is: Inpatient  Remains inpatient appropriate because:Inpatient level of care appropriate due to severity of illness   Dispo: The patient is from: Home              Anticipated d/c is to: Home              Anticipated d/c date is: 3 days              Patient currently is not medically stable to d/c.    Consultants:  Vascular surgery  Podiatry  Procedures:  Left lower extremity angiogram  Antimicrobials:  IV cefepime  Vancomycin  DVT prophylaxis: Heparin drip   Objective: Vitals:   04/07/20 1400 04/07/20 1415 04/07/20 1430 04/07/20 1505  BP: 120/72 121/75 128/74 (!) 157/89  Pulse: (!) 57 (!) 58 (!) 59 64  Resp: 15 16 14 18   Temp:    97.6 F (36.4 C)  TempSrc:    Oral  SpO2: 98% 96% 97% 99%  Weight:      Height:  Intake/Output Summary (Last 24 hours) at 04/07/2020 1732 Last data filed at 04/07/2020 1400 Gross per 24 hour  Intake 1854.03 ml  Output 2050 ml  Net -195.97 ml   Filed Weights   04/05/20 1659  Weight: 111.1 kg    Exam:  General: NAD   Cardiovascular: S1, S2 present  Respiratory: CTAB  Abdomen: Soft, nontender, nondistended, bowel sounds present  Musculoskeletal: Left great toe with noted eschar, swelling, erythema, purulent drainage expressed, noted odor  Skin:  Normal  Psychiatry: Normal mood    Data Reviewed: CBC: Recent Labs  Lab 04/05/20 1741 04/06/20 0423 04/07/20 0435  WBC 17.3* 10.0 9.4  HGB 13.6 11.0* 11.6*  HCT 37.9* 32.9* 34.1*  MCV 92.2 95.9 96.3  PLT 338 296 299   Basic Metabolic Panel: Recent Labs  Lab 04/05/20 1741 04/06/20 0423 04/07/20 0652  NA 133* 133* 132*  K 4.9 4.4 4.8  CL 103 104 104  CO2 21* 21* 20*  GLUCOSE 73 58* 177*  BUN 34* 33* 29*  CREATININE 1.40* 1.29* 1.21  CALCIUM 10.9* 9.7 9.3  MG  --   --  1.8   GFR: Estimated Creatinine Clearance: 84.8 mL/min (by C-G formula based on SCr of 1.21 mg/dL). Liver Function Tests: Recent Labs  Lab 04/06/20 0423  AST 12*  ALT 16  ALKPHOS 48  BILITOT 0.9  PROT 6.6  ALBUMIN 3.5   No results for input(s): LIPASE, AMYLASE in the last 168 hours. No results for input(s): AMMONIA in the last 168 hours. Coagulation Profile: No results for input(s): INR, PROTIME in the last 168 hours. Cardiac Enzymes: No results for input(s): CKTOTAL, CKMB, CKMBINDEX, TROPONINI in the last 168 hours. BNP (last 3 results) No results for input(s): PROBNP in the last 8760 hours. HbA1C: Recent Labs    04/06/20 0423  HGBA1C 7.3*   CBG: Recent Labs  Lab 04/06/20 1647 04/06/20 2106 04/07/20 0753 04/07/20 1050 04/07/20 1634  GLUCAP 107* 201* 164* 129* 119*   Lipid Profile: No results for input(s): CHOL, HDL, LDLCALC, TRIG, CHOLHDL, LDLDIRECT in the last 72 hours. Thyroid Function Tests: No results for input(s): TSH, T4TOTAL, FREET4, T3FREE, THYROIDAB in the last 72 hours. Anemia Panel: No results for input(s): VITAMINB12, FOLATE, FERRITIN, TIBC, IRON, RETICCTPCT in the last 72 hours. Urine analysis:    Component Value Date/Time   COLORURINE Yellow 09/18/2014 2018   COLORURINE YELLOW 09/12/2013 1555   APPEARANCEUR Clear 09/18/2014 2018   LABSPEC 1.005 09/18/2014 2018   PHURINE 6.0 09/18/2014 2018   PHURINE 5.5 09/12/2013 1555   GLUCOSEU 150 mg/dL 18/29/9371  6967   HGBUR Negative 09/18/2014 2018   HGBUR SMALL (A) 09/12/2013 1555   BILIRUBINUR Negative 09/18/2014 2018   KETONESUR Negative 09/18/2014 2018   KETONESUR 15 (A) 09/12/2013 1555   PROTEINUR 100 mg/dL 89/38/1017 5102   PROTEINUR 100 (A) 09/12/2013 1555   UROBILINOGEN 0.2 09/12/2013 1555   NITRITE Negative 09/18/2014 2018   NITRITE NEGATIVE 09/12/2013 1555   LEUKOCYTESUR Negative 09/18/2014 2018   Sepsis Labs: @LABRCNTIP (procalcitonin:4,lacticidven:4)  ) Recent Results (from the past 240 hour(s))  Aerobic/Anaerobic Culture (surgical/deep wound)     Status: None (Preliminary result)   Collection Time: 04/05/20  4:54 PM   Specimen: Ulcer  Result Value Ref Range Status   Specimen Description   Final    ULCER Performed at [facility], [address] [place], [place]    Special Requests   Final    NONE Performed at [facility], [address] [place], [place] [address]    Gram Stain   Final    FEW WBC PRESENT, PREDOMINANTLY PMN MODERATE GRAM POSITIVE COCCI FEW GRAM NEGATIVE RODS Performed at [facility], [address] [place], [place] [address]    Culture   Final    ABUNDANT PSEUDOMONAS AERUGINOSA MIXED ANAEROBIC FLORA PRESENT.  CALL LAB IF FURTHER IID REQUIRED.    Report Status PENDING  Incomplete  Blood culture (routine x 2)     Status: None (Preliminary result)   Collection Time: 04/05/20  6:57 PM   Specimen: BLOOD  Result Value Ref Range Status   Specimen Description BLOOD BLOOD RIGHT HAND  Final   Special Requests   Final    BOTTLES DRAWN AEROBIC AND ANAEROBIC Blood Culture adequate volume   Culture   Final    NO GROWTH 2 DAYS Performed at [facility], [address], [place], [place] [address]    Report Status PENDING  Incomplete  Blood culture (routine x 2)     Status: None (Preliminary result)   Collection Time: 04/05/20  6:57 PM   Specimen: BLOOD  Result Value Ref Range Status   Specimen Description  BLOOD RIGHT ANTECUBITAL  Final   Special Requests   Final    BOTTLES DRAWN AEROBIC AND ANAEROBIC Blood Culture adequate volume   Culture   Final    NO GROWTH 2 DAYS Performed at [facility], [address] [place], [place] [address]    Report Status PENDING  Incomplete  SARS Coronavirus 2 by RT PCR (hospital order, performed in [facility] hospital lab) Nasopharyngeal Nasopharyngeal Swab     Status: None   Collection Time: 04/05/20  6:57 PM   Specimen: Nasopharyngeal Swab  Result Value Ref Range Status   SARS Coronavirus 2 NEGATIVE NEGATIVE Final    Comment: (NOTE) SARS-CoV-2 target nucleic acids are NOT DETECTED.  The SARS-CoV-2 RNA is generally detectable in upper and lower respiratory specimens during the acute phase of infection. The lowest concentration of SARS-CoV-2 viral copies this assay can detect is 250 copies / mL. A negative result does not preclude SARS-CoV-2 infection and should not be used as the sole basis for treatment or other patient management decisions.  A negative result may occur with improper specimen collection / handling, submission of specimen other than nasopharyngeal swab, presence of viral mutation(s) within the areas targeted by this assay, and inadequate number of viral copies (<250 copies / mL). A negative result must be combined with clinical observations, patient history, and epidemiological information.  Fact Sheet for Patients:   BoilerBrush.com.cy  Fact Sheet for Healthcare Providers: https://pope.com/  This test is not yet approved or  cleared by the [place] FDA and has been authorized for detection and/or diagnosis of SARS-CoV-2 by FDA under an Emergency Use Authorization (EUA).  This EUA will remain in effect (meaning this test can be used) for the duration of the COVID-19 declaration under Section 564(b)(1) of the Act, 21 U.S.C. section 360bbb-3(b)(1), unless the  authorization is terminated or revoked sooner.  Performed at [facility], [address] [place], [place] [address]       Studies: PERIPHERAL VASCULAR CATHETERIZATION  Result Date: 04/07/2020 See op note   Scheduled Meds: . amLODipine  10 mg Oral Daily  . atorvastatin  10 mg Oral q1800  . carvedilol  25 mg Oral BID WC  . chlorhexidine  60 mL Topical Once  . cloNIDine  0.3 mg Oral BID  .  fentaNYL      . fentaNYL      . heparin sodium (porcine)      . insulin aspart  0-15 Units Subcutaneous TID WC  . insulin aspart  0-5 Units Subcutaneous QHS  . midazolam      . povidone-iodine  2 application Topical Once  . sodium chloride flush  3 mL Intravenous Q12H    Continuous Infusions: . sodium chloride 75 mL/hr at 04/07/20 1047  . sodium chloride    . sodium chloride    . sodium chloride 75 mL/hr at 04/07/20 1500  . ceFEPime (MAXIPIME) IV Stopped (04/07/20 0522)  . heparin    . vancomycin 750 mg (04/07/20 0916)     LOS: 2 days     Briant Cedar, MD [facility]  If 7PM-7AM, please contact night-coverage [email or web address] 04/07/2020, 5:32 PM

## 2020-04-07 NOTE — Progress Notes (Signed)
Patient not discharged. Previous note, 04/06/20 1612 written in error.

## 2020-04-08 ENCOUNTER — Inpatient Hospital Stay: Payer: Medicaid Other | Admitting: Anesthesiology

## 2020-04-08 ENCOUNTER — Inpatient Hospital Stay: Payer: Medicaid Other

## 2020-04-08 ENCOUNTER — Other Ambulatory Visit (INDEPENDENT_AMBULATORY_CARE_PROVIDER_SITE_OTHER): Payer: Self-pay | Admitting: Vascular Surgery

## 2020-04-08 ENCOUNTER — Encounter
Admission: EM | Disposition: A | Discharge status: Home / Self Care | Payer: Self-pay | Source: Home / Self Care | Attending: Internal Medicine

## 2020-04-08 DIAGNOSIS — L97529 Non-pressure chronic ulcer of other part of left foot with unspecified severity: Secondary | ICD-10-CM

## 2020-04-08 DIAGNOSIS — L02612 Cutaneous abscess of left foot: Secondary | ICD-10-CM

## 2020-04-08 DIAGNOSIS — L03032 Cellulitis of left toe: Secondary | ICD-10-CM

## 2020-04-08 HISTORY — PX: AMPUTATION: SHX166

## 2020-04-08 LAB — BASIC METABOLIC PANEL
Anion gap: 8 (ref 5–15)
BUN: 23 mg/dL — ABNORMAL HIGH (ref 6–20)
CO2: 19 mmol/L — ABNORMAL LOW (ref 22–32)
Calcium: 9.5 mg/dL (ref 8.9–10.3)
Chloride: 102 mmol/L (ref 98–111)
Creatinine, Ser: 0.98 mg/dL (ref 0.61–1.24)
GFR calc Af Amer: >60 mL/min (ref >60)
GFR calc non Af Amer: >60 mL/min (ref >60)
Glucose, Bld: 196 mg/dL — ABNORMAL HIGH (ref 70–99)
Potassium: 4.6 mmol/L (ref 3.5–5.1)
Sodium: 129 mmol/L — ABNORMAL LOW (ref 135–145)

## 2020-04-08 LAB — GLUCOSE, CAPILLARY
Glucose-Capillary: 240 mg/dL — ABNORMAL HIGH (ref 70–99)
Glucose-Capillary: 171 mg/dL — ABNORMAL HIGH (ref 70–99)
Glucose-Capillary: 255 mg/dL — ABNORMAL HIGH (ref 70–99)
Glucose-Capillary: 143 mg/dL — ABNORMAL HIGH (ref 70–99)
Glucose-Capillary: 145 mg/dL — ABNORMAL HIGH (ref 70–99)
Glucose-Capillary: 137 mg/dL — ABNORMAL HIGH (ref 70–99)
Glucose-Capillary: 143 mg/dL — ABNORMAL HIGH (ref 70–99)

## 2020-04-08 LAB — CBC
HCT: 32.9 % — ABNORMAL LOW (ref 39.0–52.0)
Hemoglobin: 11 g/dL — ABNORMAL LOW (ref 13.0–17.0)
MCH: 32.2 pg (ref 26.0–34.0)
MCHC: 33.4 g/dL (ref 30.0–36.0)
MCV: 96.2 fL (ref 80.0–100.0)
Platelets: 278 10*3/uL (ref 150–400)
RBC: 3.42 MIL/uL — ABNORMAL LOW (ref 4.22–5.81)
RDW: 12.9 % (ref 11.5–15.5)
WBC: 8.9 10*3/uL (ref 4.0–10.5)
nRBC: 0 % (ref 0.0–0.2)

## 2020-04-08 LAB — AEROBIC/ANAEROBIC CULTURE W GRAM STAIN (SURGICAL/DEEP WOUND)

## 2020-04-08 LAB — HEPARIN LEVEL (UNFRACTIONATED): Heparin Unfractionated: 0.16 IU/mL — ABNORMAL LOW (ref 0.30–0.70)

## 2020-04-08 SURGERY — AMPUTATION, FOOT, RAY
Anesthesia: General | Laterality: Left

## 2020-04-08 MED ORDER — MIDAZOLAM HCL 2 MG/2ML IJ SOLN
INTRAMUSCULAR | Status: DC | PRN
  Administered 2020-04-08: 1 mg via INTRAVENOUS

## 2020-04-08 MED ORDER — MIDAZOLAM HCL 2 MG/2ML IJ SOLN
INTRAMUSCULAR | Status: AC
  Filled 2020-04-08: qty 2

## 2020-04-08 MED ORDER — NEOMYCIN-POLYMYXIN B GU 40-200000 IR SOLN
Status: AC
  Filled 2020-04-08: qty 2

## 2020-04-08 MED ORDER — PHENYLEPHRINE HCL (PRESSORS) 10 MG/ML IV SOLN
INTRAVENOUS | Status: DC | PRN
  Administered 2020-04-08 (×4): 100 ug via INTRAVENOUS

## 2020-04-08 MED ORDER — FENTANYL CITRATE (PF) 100 MCG/2ML IJ SOLN: 25.0000 ug | INTRAMUSCULAR | Status: DC | PRN

## 2020-04-08 MED ORDER — ONDANSETRON HCL 4 MG/2ML IJ SOLN
INTRAMUSCULAR | Status: AC
  Filled 2020-04-08: qty 2

## 2020-04-08 MED ORDER — FENTANYL CITRATE (PF) 100 MCG/2ML IJ SOLN
INTRAMUSCULAR | Status: AC
  Filled 2020-04-08: qty 2

## 2020-04-08 MED ORDER — BUPIVACAINE HCL (PF) 0.5 % IJ SOLN
INTRAMUSCULAR | Status: AC
  Filled 2020-04-08: qty 30

## 2020-04-08 MED ORDER — OXYCODONE HCL 5 MG PO TABS: 5.0000 mg | ORAL_TABLET | Freq: Once | ORAL | Status: DC | PRN

## 2020-04-08 MED ORDER — LIDOCAINE HCL (PF) 2 % IJ SOLN
INTRAMUSCULAR | Status: AC
  Filled 2020-04-08: qty 5

## 2020-04-08 MED ORDER — EPHEDRINE SULFATE 50 MG/ML IJ SOLN
INTRAMUSCULAR | Status: DC | PRN
  Administered 2020-04-08: 10 mg via INTRAVENOUS
  Administered 2020-04-08: 5 mg via INTRAVENOUS
  Administered 2020-04-08: 10 mg via INTRAVENOUS

## 2020-04-08 MED ORDER — PROPOFOL 10 MG/ML IV BOLUS
INTRAVENOUS | Status: AC
  Filled 2020-04-08: qty 20

## 2020-04-08 MED ORDER — HEPARIN BOLUS VIA INFUSION
3000.0000 [IU] | Freq: Once | INTRAVENOUS | Status: AC
  Administered 2020-04-08: 3000 [IU] via INTRAVENOUS
  Filled 2020-04-08: qty 3000

## 2020-04-08 MED ORDER — LIDOCAINE HCL (CARDIAC) PF 100 MG/5ML IV SOSY
PREFILLED_SYRINGE | INTRAVENOUS | Status: DC | PRN
  Administered 2020-04-08: 100 mg via INTRAVENOUS

## 2020-04-08 MED ORDER — VANCOMYCIN HCL 1000 MG IV SOLR
INTRAVENOUS | Status: AC
  Filled 2020-04-08: qty 1000

## 2020-04-08 MED ORDER — FENTANYL CITRATE (PF) 100 MCG/2ML IJ SOLN
INTRAMUSCULAR | Status: DC | PRN
  Administered 2020-04-08: 50 ug via INTRAVENOUS

## 2020-04-08 MED ORDER — ACETAMINOPHEN 10 MG/ML IV SOLN: 1000.0000 mg | Freq: Once | INTRAVENOUS | Status: DC | PRN

## 2020-04-08 MED ORDER — NEOMYCIN-POLYMYXIN B GU 40-200000 IR SOLN
Status: DC | PRN
  Administered 2020-04-08: 2 mL

## 2020-04-08 MED ORDER — PROPOFOL 10 MG/ML IV BOLUS
INTRAVENOUS | Status: DC | PRN
  Administered 2020-04-08: 120 mg via INTRAVENOUS
  Administered 2020-04-08: 80 mg via INTRAVENOUS

## 2020-04-08 MED ORDER — OXYCODONE HCL 5 MG/5ML PO SOLN: 5.0000 mg | Freq: Once | ORAL | Status: DC | PRN

## 2020-04-08 MED ORDER — BUPIVACAINE HCL 0.5 % IJ SOLN
INTRAMUSCULAR | Status: DC | PRN
  Administered 2020-04-08: 20 mL

## 2020-04-08 MED ORDER — ONDANSETRON HCL 4 MG/2ML IJ SOLN: 4.0000 mg | Freq: Once | INTRAMUSCULAR | Status: DC | PRN

## 2020-04-08 MED ORDER — ONDANSETRON HCL 4 MG/2ML IJ SOLN
INTRAMUSCULAR | Status: DC | PRN
  Administered 2020-04-08: 4 mg via INTRAVENOUS

## 2020-04-08 SURGICAL SUPPLY — 45 items
BLADE MED AGGRESSIVE (BLADE) ×3 IMPLANT
BLADE OSC/SAGITTAL MD 5.5X18 (BLADE) IMPLANT
BLADE SURG 15 STRL LF DISP TIS (BLADE) IMPLANT
BLADE SURG 15 STRL SS (BLADE)
BLADE SURG MINI STRL (BLADE) IMPLANT
BNDG CONFORM 2 STRL LF (GAUZE/BANDAGES/DRESSINGS) IMPLANT
BNDG ELASTIC 4X5.8 VLCR STR LF (GAUZE/BANDAGES/DRESSINGS) ×3 IMPLANT
BNDG ESMARK 4X12 TAN STRL LF (GAUZE/BANDAGES/DRESSINGS) ×3 IMPLANT
BNDG GAUZE 4.5X4.1 6PLY STRL (MISCELLANEOUS) ×6 IMPLANT
CANISTER SUCT 1200ML W/VALVE (MISCELLANEOUS) ×3 IMPLANT
CNTNR SPEC 2.5X3XGRAD LEK (MISCELLANEOUS) ×1
CONT SPEC 4OZ STER OR WHT (MISCELLANEOUS) ×2
CONTAINER SPEC 2.5X3XGRAD LEK (MISCELLANEOUS) ×1 IMPLANT
COVER WAND RF STERILE (DRAPES) ×3 IMPLANT
CUFF TOURN 18 STER (MISCELLANEOUS) ×3 IMPLANT
CUFF TOURN DUAL PL 12 NO SLV (MISCELLANEOUS) IMPLANT
DRAPE FLUOR MINI C-ARM 54X84 (DRAPES) IMPLANT
DURAPREP 26ML APPLICATOR (WOUND CARE) ×3 IMPLANT
ELECT REM PT RETURN 9FT ADLT (ELECTROSURGICAL) ×3
ELECTRODE REM PT RTRN 9FT ADLT (ELECTROSURGICAL) ×1 IMPLANT
GAUZE SPONGE 4X4 12PLY STRL (GAUZE/BANDAGES/DRESSINGS) ×6 IMPLANT
GAUZE XEROFORM 1X8 LF (GAUZE/BANDAGES/DRESSINGS) ×3 IMPLANT
GLOVE BIO SURGEON STRL SZ7 (GLOVE) ×3 IMPLANT
GLOVE INDICATOR 7.0 STRL GRN (GLOVE) ×3 IMPLANT
GOWN STRL REUS W/ TWL LRG LVL3 (GOWN DISPOSABLE) ×2 IMPLANT
GOWN STRL REUS W/TWL LRG LVL3 (GOWN DISPOSABLE) ×4
HANDPIECE VERSAJET DEBRIDEMENT (MISCELLANEOUS) IMPLANT
KIT TURNOVER KIT A (KITS) ×3 IMPLANT
LABEL OR SOLS (LABEL) ×3 IMPLANT
NEEDLE FILTER BLUNT 18X 1/2SAF (NEEDLE) ×2
NEEDLE FILTER BLUNT 18X1 1/2 (NEEDLE) ×1 IMPLANT
NEEDLE HYPO 25X1 1.5 SAFETY (NEEDLE) ×6 IMPLANT
NS IRRIG 500ML POUR BTL (IV SOLUTION) ×3 IMPLANT
PACK EXTREMITY (MISCELLANEOUS) ×3 IMPLANT
SOL .9 NS 3000ML IRR  AL (IV SOLUTION)
SOL .9 NS 3000ML IRR UROMATIC (IV SOLUTION) IMPLANT
SOL PREP PVP 2OZ (MISCELLANEOUS) ×3
SOLUTION PREP PVP 2OZ (MISCELLANEOUS) ×1 IMPLANT
STOCKINETTE STRL 6IN 960660 (GAUZE/BANDAGES/DRESSINGS) ×3 IMPLANT
SUT ETHILON 3-0 FS-10 30 BLK (SUTURE) ×6
SUT VIC AB 3-0 SH 27 (SUTURE) ×2
SUT VIC AB 3-0 SH 27X BRD (SUTURE) ×1 IMPLANT
SUTURE EHLN 3-0 FS-10 30 BLK (SUTURE) ×2 IMPLANT
SWAB CULTURE AMIES ANAERIB BLU (MISCELLANEOUS) ×3 IMPLANT
SYR 10ML LL (SYRINGE) ×3 IMPLANT

## 2020-04-08 NOTE — Progress Notes (Addendum)
PROGRESS NOTE  Marc [name] OIL:[id number] DOB: 12/22/1960 DOA: 04/05/2020 PCP: [facility], [facility]  HPI/Recap of past 24 hours: HPI from Dr Chipper Herb Marc [name] a 58 y.o.malewith medical history significant ofdiabetes, systolic dysfunction CHF, hypertension, hyperlipidemia, medication noncompliance, coronary artery disease, ongoing tobacco abuse as well as previous diabetic foot ulcer with amputation on the right toe the great 1 who is being followed by podiatry for a similar left toe also around the big toe. X-ray of the toe showed possible osteomyelitis.  He was started on cefepime and vancomycin.  Patient admitted for further management.    Today, saw patient prior to surgery, denies any new complaints.   Assessment/Plan: Principal Problem:   Cellulitis and abscess of toe of left foot Active Problems:   Chronic systolic heart failure (HCC)   Hypertension   Hyperlipidemia   PAF (paroxysmal atrial fibrillation) (HCC)   Coronary artery disease   Diabetes (HCC)   Left great toe diabetic foot ulcer Peripheral vascular disease S/p left extremity angiogram/PVD Currently afebrile, with resolving leukocytosis X-ray left foot showed mild gas in the soft tissues at the first IP joint likely due to ulcer, no definitive radiographic evidence of osteomyelitis Patient has been followed by podiatry as an outpatient, podiatry consulted, recommend daily wet-to-dry dressings, and plan for amputation around 5 PM on 04/08/2020 Vascular surgery on board s/p left lower extremity angiogram, was able to improve arterial flow, but will go back again on Friday via a pedal approach  Continue heparin drip Continue statins Continue IV cefepime, vancomycin  Diabetes mellitus type 2, uncontrolled with hyperglycemia A1c 7.3 SSI, Accu-Cheks, hypoglycemic protocol  Hypertension BP stable Continue Coreg, amlodipine, clonidine  CKD stage IIIa with metabolic acidosis Creatinine at  baseline Daily BMP  Chronic systolic HF Appears euvolemic Continue Coreg  Paroxysmal A. Fib Heart rate controlled Continue Coreg Not on any anticoagulation at home, currently on heparin drip  Tobacco abuse Advised to quit  Obesity Lifestyle modification advised        Malnutrition Type:      Malnutrition Characteristics:      Nutrition Interventions:       Estimated body mass index is 32.32 kg/m as calculated from the following:   Height as of this encounter: 6\' 1"  (1.854 m).   Weight as of this encounter: 111.1 kg.     Code Status: Full  Family Communication: Daughter-in-law at bedside on 04/08/2020  Disposition Plan: Status is: Inpatient  Remains inpatient appropriate because:Inpatient level of care appropriate due to severity of illness   Dispo: The patient is from: Home              Anticipated d/c is to: Home              Anticipated d/c date is: 3 days              Patient currently is not medically stable to d/c.    Consultants:  Vascular surgery  Podiatry  Procedures:  Left lower extremity angiogram  Antimicrobials:  IV cefepime  Vancomycin  DVT prophylaxis: Heparin drip   Objective: Vitals:   04/07/20 1958 04/07/20 2307 04/08/20 0328 04/08/20 1132  BP: (!) 154/81 118/69 124/69 122/73  Pulse: 73 63 (!) 59 (!) 56  Resp: 20 17 15 20   Temp: (!) 97.4 F (36.3 C) 98 F (36.7 C) 97.6 F (36.4 C) 97.6 F (36.4 C)  TempSrc: Oral Oral  Oral  SpO2: 98% 97% 100% 99%  Weight:  Height:        Intake/Output Summary (Last 24 hours) at 04/08/2020 1700 Last data filed at 04/08/2020 0400 Gross per 24 hour  Intake 3325.56 ml  Output 875 ml  Net 2450.56 ml   Filed Weights   04/05/20 1659  Weight: 111.1 kg    Exam:  General: NAD   Cardiovascular: S1, S2 present  Respiratory: CTAB  Abdomen: Soft, nontender, nondistended, bowel sounds present  Musculoskeletal: Left great toe with noted eschar, swelling, erythema,  purulent drainage expressed, noted odor  Skin: Normal  Psychiatry: Normal mood    Data Reviewed: CBC: Recent Labs  Lab 04/05/20 1741 04/06/20 0423 04/07/20 0435 04/08/20 0656  WBC 17.3* 10.0 9.4 8.9  HGB 13.6 11.0* 11.6* 11.0*  HCT 37.9* 32.9* 34.1* 32.9*  MCV 92.2 95.9 96.3 96.2  PLT 338 296 299 278   Basic Metabolic Panel: Recent Labs  Lab 04/05/20 1741 04/06/20 0423 04/07/20 0652 04/08/20 0656  NA 133* 133* 132* 129*  K 4.9 4.4 4.8 4.6  CL 103 104 104 102  CO2 21* 21* 20* 19*  GLUCOSE 73 58* 177* 196*  BUN 34* 33* 29* 23*  CREATININE 1.40* 1.29* 1.21 0.98  CALCIUM 10.9* 9.7 9.3 9.5  MG  --   --  1.8  --    GFR: Estimated Creatinine Clearance: 104.8 mL/min (by C-G formula based on SCr of 0.98 mg/dL). Liver Function Tests: Recent Labs  Lab 04/06/20 0423  AST 12*  ALT 16  ALKPHOS 48  BILITOT 0.9  PROT 6.6  ALBUMIN 3.5   No results for input(s): LIPASE, AMYLASE in the last 168 hours. No results for input(s): AMMONIA in the last 168 hours. Coagulation Profile: No results for input(s): INR, PROTIME in the last 168 hours. Cardiac Enzymes: No results for input(s): CKTOTAL, CKMB, CKMBINDEX, TROPONINI in the last 168 hours. BNP (last 3 results) No results for input(s): PROBNP in the last 8760 hours. HbA1C: Recent Labs    04/06/20 0423  HGBA1C 7.3*   CBG: Recent Labs  Lab 04/07/20 1634 04/07/20 2205 04/08/20 0722 04/08/20 1129 04/08/20 1548  GLUCAP 119* 240* 171* 255* 143*   Lipid Profile: No results for input(s): CHOL, HDL, LDLCALC, TRIG, CHOLHDL, LDLDIRECT in the last 72 hours. Thyroid Function Tests: No results for input(s): TSH, T4TOTAL, FREET4, T3FREE, THYROIDAB in the last 72 hours. Anemia Panel: No results for input(s): VITAMINB12, FOLATE, FERRITIN, TIBC, IRON, RETICCTPCT in the last 72 hours. Urine analysis:    Component Value Date/Time   COLORURINE Yellow 09/18/2014 2018   COLORURINE YELLOW 09/12/2013 1555   APPEARANCEUR Clear  09/18/2014 2018   LABSPEC 1.005 09/18/2014 2018   PHURINE 6.0 09/18/2014 2018   PHURINE 5.5 09/12/2013 1555   GLUCOSEU 150 mg/dL 02/58/5277 8242   HGBUR Negative 09/18/2014 2018   HGBUR SMALL (A) 09/12/2013 1555   BILIRUBINUR Negative 09/18/2014 2018   KETONESUR Negative 09/18/2014 2018   KETONESUR 15 (A) 09/12/2013 1555   PROTEINUR 100 mg/dL 35/36/1443 1540   PROTEINUR 100 (A) 09/12/2013 1555   UROBILINOGEN 0.2 09/12/2013 1555   NITRITE Negative 09/18/2014 2018   NITRITE NEGATIVE 09/12/2013 1555   LEUKOCYTESUR Negative 09/18/2014 2018   Sepsis Labs: @LABRCNTIP (procalcitonin:4,lacticidven:4)  ) Recent Results (from the past 240 hour(s))  Aerobic/Anaerobic Culture (surgical/deep wound)     Status: None   Collection Time: 04/05/20  4:54 PM   Specimen: Ulcer  Result Value Ref Range Status   Specimen Description   Final    ULCER Performed at [facility],  [address] [place], [place] [address]    Special Requests   Final    NONE Performed at [facility], [address] [place], [place] [address]    Gram Stain   Final    FEW WBC PRESENT, PREDOMINANTLY PMN MODERATE GRAM POSITIVE COCCI FEW GRAM NEGATIVE RODS Performed at [facility], [address] [place], [place] [address]    Culture   Final    ABUNDANT PSEUDOMONAS AERUGINOSA MIXED ANAEROBIC FLORA PRESENT.  CALL LAB IF FURTHER IID REQUIRED.    Report Status 04/08/2020 FINAL  Final   Organism ID, Bacteria PSEUDOMONAS AERUGINOSA  Final      Susceptibility   Pseudomonas aeruginosa - MIC*    CEFTAZIDIME 4 SENSITIVE Sensitive     CIPROFLOXACIN <=0.25 SENSITIVE Sensitive     GENTAMICIN 2 SENSITIVE Sensitive     IMIPENEM 2 SENSITIVE Sensitive     PIP/TAZO <=4 SENSITIVE Sensitive     CEFEPIME 2 SENSITIVE Sensitive     * ABUNDANT PSEUDOMONAS AERUGINOSA  Blood culture (routine x 2)     Status: None (Preliminary result)   Collection Time: 04/05/20  6:57 PM   Specimen: BLOOD  Result Value Ref  Range Status   Specimen Description BLOOD BLOOD RIGHT HAND  Final   Special Requests   Final    BOTTLES DRAWN AEROBIC AND ANAEROBIC Blood Culture adequate volume   Culture   Final    NO GROWTH 3 DAYS Performed at [facility], [address] [place], [place] [address]    Report Status PENDING  Incomplete  Blood culture (routine x 2)     Status: None (Preliminary result)   Collection Time: 04/05/20  6:57 PM   Specimen: BLOOD  Result Value Ref Range Status   Specimen Description BLOOD RIGHT ANTECUBITAL  Final   Special Requests   Final    BOTTLES DRAWN AEROBIC AND ANAEROBIC Blood Culture adequate volume   Culture   Final    NO GROWTH 3 DAYS Performed at [facility], [address], [place], [place] [address]    Report Status PENDING  Incomplete  SARS Coronavirus 2 by RT PCR (hospital order, performed in [facility] hospital lab) Nasopharyngeal Nasopharyngeal Swab     Status: None   Collection Time: 04/05/20  6:57 PM   Specimen: Nasopharyngeal Swab  Result Value Ref Range Status   SARS Coronavirus 2 NEGATIVE NEGATIVE Final    Comment: (NOTE) SARS-CoV-2 target nucleic acids are NOT DETECTED.  The SARS-CoV-2 RNA is generally detectable in upper and lower respiratory specimens during the acute phase of infection. The lowest concentration of SARS-CoV-2 viral copies this assay can detect is 250 copies / mL. A negative result does not preclude SARS-CoV-2 infection and should not be used as the sole basis for treatment or other patient management decisions.  A negative result may occur with improper specimen collection / handling, submission of specimen other than nasopharyngeal swab, presence of viral mutation(s) within the areas targeted by this assay, and inadequate number of viral copies (<250 copies / mL). A negative result must be combined with clinical observations, patient history, and epidemiological information.  Fact Sheet for Patients:    BoilerBrush.com.cy  Fact Sheet for Healthcare Providers: https://pope.com/  This test is not yet approved or  cleared by the [place] FDA and has been authorized for detection and/or diagnosis of SARS-CoV-2 by FDA under an Emergency Use Authorization (EUA).  This EUA will remain in effect (meaning this test  can be used) for the duration of the COVID-19 declaration under Section 564(b)(1) of the Act, 21 U.S.C. section 360bbb-3(b)(1), unless the authorization is terminated or revoked sooner.  Performed at [facility], [address], [place], [place] [address]       Studies: No results found.  Scheduled Meds: . amLODipine  10 mg Oral Daily  . atorvastatin  10 mg Oral q1800  . carvedilol  25 mg Oral BID WC  . chlorhexidine  60 mL Topical Once  . cloNIDine  0.3 mg Oral BID  . insulin aspart  0-15 Units Subcutaneous TID WC  . insulin aspart  0-5 Units Subcutaneous QHS  . povidone-iodine  2 application Topical Once  . sodium chloride flush  3 mL Intravenous Q12H    Continuous Infusions: . sodium chloride 75 mL/hr at 04/08/20 1656  . ceFEPime (MAXIPIME) IV 2 g (04/08/20 1452)  . vancomycin 750 mg (04/08/20 1119)     LOS: 3 days     Briant Cedar, MD [facility]  If 7PM-7AM, please contact night-coverage [email or web address] 04/08/2020, 5:00 PM

## 2020-04-08 NOTE — Anesthesia Postprocedure Evaluation (Signed)
Anesthesia Post Note  Patient: Marc [name]  Procedure(s) Performed: AMPUTATION RAY (Left )  Patient location during evaluation: PACU Anesthesia Type: General Level of consciousness: awake and alert Pain management: pain level controlled Vital Signs Assessment: post-procedure vital signs reviewed and stable Respiratory status: spontaneous breathing, nonlabored ventilation, respiratory function stable and patient connected to nasal cannula oxygen Cardiovascular status: blood pressure returned to baseline and stable Postop Assessment: no apparent nausea or vomiting Anesthetic complications: no   No complications documented.   Last Vitals:  Vitals:   04/08/20 1931 04/08/20 1959  BP: 124/70 115/67  Pulse: (!) 58 (!) 54  Resp: 20 16  Temp: 36.7 C 36.9 C  SpO2: 96% 98%    Last Pain:  Vitals:   04/08/20 1931  TempSrc:   PainSc: 0-No pain                 Corinda Gubler

## 2020-04-08 NOTE — Progress Notes (Signed)
ANTICOAGULATION CONSULT NOTE - Follow Up Consult  Pharmacy Consult for Heparin infusion Indication: peripheral intervention  No Known Allergies  Patient Measurements: Height: 6\' 1"  (185.4 cm) Weight: 111.1 kg (245 lb) IBW/kg (Calculated) : 79.9 Heparin Dosing Weight: 103.3 kg  Vital Signs: Temp: 97.6 F (36.4 C) (08/05 0328) Temp Source: Oral (08/04 2307) BP: 124/69 (08/05 0328) Pulse Rate: 59 (08/05 0328)  Labs: Recent Labs    04/06/20 0423 04/06/20 0423 04/07/20 0435 04/07/20 06/07/20 04/07/20 2149 04/08/20 0656  HGB 11.0*   < > 11.6*  --   --  11.0*  HCT 32.9*  --  34.1*  --   --  32.9*  PLT 296  --  299  --   --  278  HEPARINUNFRC  --   --   --   --  <0.10* 0.16*  CREATININE 1.29*  --   --  1.21  --  0.98   < > = values in this interval not displayed.    Estimated Creatinine Clearance: 104.8 mL/min (by C-G formula based on SCr of 0.98 mg/dL).  Assessment: Pharmacy was consulted for heparin dosing and monitoring for a 59 yo male with PMH of diabetes, systolic dysfunction CHF, hypertension, hyperlipidemia, medication noncompliance, coronary artery disease, ongoing tobacco abuse as well as previous diabetic foot ulcer with amputation on thegreatright toe. He was seen in the ER with worsening of his ulcer and underwent a LLE angiogram this afternoon. He was on no chronic AC prior to this admission. H&H and Plt are stable at this time  Heparin was supposed to be started at 1430 on 8/4 but was not started until 8/5 at 0028 at 1600 units with no bolus per MD  Heparin level at 8/5 0656 was subtherapeutic at 0.16  Goal of Therapy:  Heparin level 0.3-0.7 units/ml  Monitor platelets by anticoagulation protocol: Yes   Plan:  Give 3000 units bolus x 1 Will increase heparin infusion to 1900 units/hr Check anti-Xa level in 6 hours and daily while on heparin Continue to monitor H&H and platelets  10/4, PharmD Pharmacy Resident  04/08/2020 8:00 AM

## 2020-04-08 NOTE — Progress Notes (Signed)
[facility] Daily Progress Note   Subjective: 04/07/20: 1. Introduction catheter into left lower extremity 3rd order catheter placement  2. Contrast injection left lower extremity for distal runoff  3. Percutaneous transluminal angioplasty and stent placement of the left external iliac artery             4.  Percutaneous transluminal angioplasty and stent placement of the left superficial femoral artery to 6 mm 4. Percutaneous transluminal angioplasty left tibioperoneal trunk and proximal peroneal to 4 mm  Patient without complaint this AM. No issues overnight.   Objective: Vitals:   04/07/20 1900 04/07/20 1958 04/07/20 2307 04/08/20 0328  BP: (!) 164/74 (!) 154/81 118/69 124/69  Pulse:  73 63 (!) 59  Resp:  20 17 15   Temp:  (!) 97.4 F (36.3 C) 98 F (36.7 C) 97.6 F (36.4 C)  TempSrc:  Oral Oral   SpO2:  98% 97% 100%  Weight:      Height:        Intake/Output Summary (Last 24 hours) at 04/08/2020 1023 Last data filed at 04/08/2020 0400 Gross per 24 hour  Intake 3325.56 ml  Output 1325 ml  Net 2000.56 ml   Physical Exam: A&Ox3, NAD CV: RRR Pulmonary: CTA Bilaterally Abdomen: Soft, Nontender, Nondistended Right Groin:  Access Site: Clean and dry.  No swelling or drainage noted.  PAD in place. Vascular:  Left lower extremity: Extremities warm distally toes.  Mild edema.  Motor/sensory intact.   Laboratory: CBC    Component Value Date/Time   WBC 8.9 04/08/2020 0656   HGB 11.0 (L) 04/08/2020 0656   HGB 18.7 (H) 09/18/2014 1835   HCT 32.9 (L) 04/08/2020 0656   HCT 56.1 (H) 09/18/2014 1835   PLT 278 04/08/2020 0656   PLT 268 09/18/2014 1835   BMET    Component Value Date/Time   NA 129 (L) 04/08/2020 0656   NA 134 (L) 09/18/2014 1835   K 4.6 04/08/2020 0656   K 4.0 09/18/2014 1835   CL 102 04/08/2020 0656   CL 102 09/18/2014 1835   CO2 19 (L) 04/08/2020 0656   CO2 24 09/18/2014 1835    GLUCOSE 196 (H) 04/08/2020 0656   GLUCOSE 352 (H) 09/18/2014 1835   BUN 23 (H) 04/08/2020 0656   BUN 18 09/18/2014 1835   CREATININE 0.98 04/08/2020 0656   CREATININE 1.23 09/18/2014 1835   CALCIUM 9.5 04/08/2020 0656   CALCIUM 8.8 09/18/2014 1835   GFRNONAA >60 04/08/2020 0656   GFRNONAA >60 09/18/2014 1835   GFRNONAA >60 12/04/2013 0953   GFRAA >60 04/08/2020 0656   GFRAA >60 09/18/2014 1835   GFRAA >60 12/04/2013 0953   Assessment/Planning: The patient-year-old male with multiple medical issues to the left foot s/p endovascular intervention POD#1  1) atherosclerotic disease with ulceration: s/p endovascular intervention POD#1.  Was able to make some progress in regard to improving the patient's arterial patency however like to return to the angiography suite tomorrow and a second attempt at revascularization by the pedal approach.  Will transition from heparin to aspirin and surgery.  Surgery with podiatry.  Okay to remove PAD.  2) hyperlipidemia: On aspirin and statin for medical management. We will transition from heparin to Plavix after surgery.  3) Tobacco Abuse: We had a discussion for approximately three minutes regarding the absolute need for smoking cessation due to the deleterious nature of tobacco on the vascular system. We discussed the tobacco use would diminish patency of any intervention, and likely  significantly worsen progressio of disease. We discussed multiple agents for quitting including replacement therapy or medications to reduce cravings such as Chantix. The patient voices their understanding of the importance of smoking cessation.  Discussed with Dr. Charlie Pitter Fiana Gladu PA-C 04/08/2020 10:23 AM

## 2020-04-08 NOTE — Progress Notes (Signed)
PAD maintained on R groin, as ordered kept inflated at 40 mls air. Deflated and re-inflated for assessment per clinical skills nursing procedure. PT and DP pulses audible per doppler.

## 2020-04-08 NOTE — Transfer of Care (Signed)
Immediate Anesthesia Transfer of Care Note  Patient: Marc [name]  Procedure(s) Performed: AMPUTATION RAY (Left )  Patient Location: PACU  Anesthesia Type:General  Level of Consciousness: awake, alert  and oriented  Airway & Oxygen Therapy: Patient connected to face mask oxygen  Post-op Assessment: Post -op Vital signs reviewed and stable  Post vital signs: stable  Last Vitals:  Vitals Value Taken Time  BP 126/72   Temp    Pulse 67   Resp 15   SpO2 95     Last Pain:  Vitals:   04/08/20 1132  TempSrc: Oral  PainSc:       Patients Stated Pain Goal: 0 (04/07/20 1930)  Complications: No complications documented.

## 2020-04-08 NOTE — Anesthesia Preprocedure Evaluation (Addendum)
Anesthesia Evaluation  Patient identified by MRN, date of birth, ID band Patient awake    Reviewed: Allergy & Precautions, H&P , NPO status , Patient's Chart, lab work & pertinent test results, reviewed documented beta blocker date and time   History of Anesthesia Complications Negative for: history of anesthetic complications  Airway Mallampati: III  TM Distance: <3 FB Neck ROM: limited    Dental no notable dental hx. (+) Poor Dentition, Chipped, Dental Advisory Given   Pulmonary neg sleep apnea, neg COPD, Current Smoker and Patient abstained from smoking.,    Pulmonary exam normal breath sounds clear to auscultation       Cardiovascular Exercise Tolerance: Good METShypertension, + CAD, + Past MI, + CABG, + Peripheral Vascular Disease and +CHF  + dysrhythmias  Rhythm:Regular Rate:Normal - Systolic murmurs 04/6577 ECHO Study Conclusions   - Left ventricle: The cavity size was mildly dilated. There was  mild concentric hypertrophy. Systolic function was mildly to  moderately reduced. The estimated ejection fraction was in the  range of 40% to 45%. Probable hypokinesis of the  mid-apicalanteroseptal, anterior, and apical myocardium. Doppler  parameters are consistent with abnormal left ventricular  relaxation (grade 1 diastolic dysfunction).  - Left atrium: The atrium was mildly dilated.  - Right atrium: The atrium was mildly dilated.   CABG 2015 1 AVb on EKG, PVC   Neuro/Psych PSYCHIATRIC DISORDERS Anxiety negative neurological ROS     GI/Hepatic neg GERD  ,(+)     (-) substance abuse  ,   Endo/Other  diabetes  Renal/GU negative Renal ROS     Musculoskeletal   Abdominal   Peds  Hematology   Anesthesia Other Findings Past Medical History: No date: Anxiety No date: Cardiomyopathy [facility])     Comment:  45-50% No date: CHF (congestive heart failure) (HCC) 07/2013: Chronic systolic heart failure  (HCC)     Comment:  Ejection fraction of 45-50% with possible inferior wall               hypokinesis No date: Coronary artery disease     Comment:  Significant left main and Severe three-vessel coronary               artery disease in December of 2014. He underwent CABG in               January of 2015 with LIMA to LAD, sequential SVG to               ramus/OM 2 and SVG to OM 3 No date: Diabetes mellitus without complication (HCC) No date: Dysrhythmia No date: Hyperlipidemia No date: Hypertension No date: Medical non-compliance No date: MI (myocardial infarction) (HCC) No date: Obesity No date: Tobacco abuse  Past Surgical History: 02/29/2016: AMPUTATION TOE; Right     Comment:  Procedure: AMPUTATION TOE;  Surgeon: Linus Galas, DPM;                Location: [facility] ORS;  Service: Podiatry;  Laterality:               Right;  Great toe No date: CARDIAC CATHETERIZATION     Comment:  [facility] no stent 09/16/2013: CORONARY ARTERY BYPASS GRAFT; N/A     Comment:  Procedure: CORONARY ARTERY BYPASS GRAFTING (CABG);                Surgeon: [name], MD;  Location: [facility] OR;  Service:  Open Heart Surgery;  Laterality: N/A;  CABG x four, using              left internal mammary artery and right leg greater               saphenous vein 09/16/2013: INTRAOPERATIVE TRANSESOPHAGEAL ECHOCARDIOGRAM; N/A     Comment:  Procedure: INTRAOPERATIVE TRANSESOPHAGEAL               ECHOCARDIOGRAM;  Surgeon: [name], MD;  Location:               [facility] OR;  Service: Open Heart Surgery;  Laterality: N/A; No date: knee surgery; Right No date: LASIK; Bilateral  BMI    Body Mass Index: 32.32 kg/m      Reproductive/Obstetrics                            Anesthesia Physical Anesthesia Plan  ASA: III  Anesthesia Plan: General   Post-op Pain Management:    Induction: Intravenous  PONV Risk Score and Plan: 1 and Treatment may vary due to age or medical condition,  Ondansetron and Dexamethasone  Airway Management Planned: LMA  Additional Equipment: None  Intra-op Plan:   Post-operative Plan: Extubation in OR  Informed Consent: I have reviewed the patients History and Physical, chart, labs and discussed the procedure including the risks, benefits and alternatives for the proposed anesthesia with the patient or authorized representative who has indicated his/her understanding and acceptance.     Dental Advisory Given  Plan Discussed with: CRNA  Anesthesia Plan Comments: (Discussed risks of anesthesia with patient, including PONV, sore throat, lip/dental damage. Rare risks discussed as well, such as cardiorespiratory and neurological sequelae. Patient understands.)       Anesthesia Quick Evaluation

## 2020-04-08 NOTE — Anesthesia Procedure Notes (Signed)
Procedure Name: LMA Insertion Date/Time: 04/08/2020 5:23 PM Performed by: Irving Burton, CRNA Pre-anesthesia Checklist: Patient identified, Emergency Drugs available, Patient being monitored and Suction available Patient Re-evaluated:Patient Re-evaluated prior to induction Oxygen Delivery Method: Circle system utilized Preoxygenation: Pre-oxygenation with 100% oxygen Induction Type: IV induction Ventilation: Mask ventilation without difficulty LMA: LMA inserted LMA Size: 4.5 Number of attempts: 1 Placement Confirmation: positive ETCO2 and breath sounds checked- equal and bilateral Tube secured with: Tape Dental Injury: Teeth and Oropharynx as per pre-operative assessment

## 2020-04-08 NOTE — H&P (Signed)
HISTORY AND PHYSICAL INTERVAL NOTE:  04/08/2020  5:04 PM  Marc [name]  has presented today for surgery, with the diagnosis of osteomyelitis left hallux with gangrene.  The various methods of treatment have been discussed with the patient.  No guarantees were given.  After consideration of risks, benefits and other options for treatment, the patient has consented to surgery.  I have reviewed the patients' chart and labs.    A history and physical examination was performed. The patient was reexamined.  There have been no changes to this history and physical examination.  PROCEDURE: LEFT PARTIAL 1ST RAY AMPUTATION  Marc [name], DPM

## 2020-04-08 NOTE — Op Note (Signed)
PODIATRY / FOOT AND ANKLE SURGERY OPERATIVE REPORT    SURGEON: Rosetta Posner, DPM  PRE-OPERATIVE DIAGNOSIS:  1.  Left hallux osteomyelitis with gangrene and associated cellulitis with ulceration to bone 2.  PVD 3.  Diabetes type 2 polyneuropathy  POST-OPERATIVE DIAGNOSIS: Same  PROCEDURE(S): 1. Left hallux amputation  HEMOSTASIS: No tourniquet  ANESTHESIA: general  ESTIMATED BLOOD LOSS: 30 cc  FINDING(S): 1.  Left hallux gangrene with bone exposed at the IPJ hallux with associated osteomyelitis. 2.  Some blood flow present at the amputation site, capillary fill times appear to be intact to dorsal and plantar flaps after amputation was complete.  PATHOLOGY/SPECIMEN(S): Left hallux wound culture, left hallux pathologic specimen with proximal phalanx base marked in purple ink  INDICATIONS:   [name] is a 59 y.o. male who presents with a nonhealing ulceration to the left IPJ hallux.  Patient was seen by Dr Alberteen Spindle in outpatient clinic.  Patient was noted to have worsening signs of infection as well as gangrenous changes to the IPJ hallux.  Patient was sent to the emergency room for further work-up and care.  Vascular was also consulted at that time after patient was admitted.  Patient underwent revascularization procedure yesterday which did improve his blood flow somewhat but vascular is planning on going back on Friday to try to improve his flow yet further but recommend amputation at this time due to infectious source.  Discussed all treatment options with the patient both conservative and surgical attempts at correction at this time patient is elected for procedure consisting of left partial first ray amputation versus left hallux amputation.  DESCRIPTION: After obtaining full informed written consent, the patient was brought back to the operating room and placed supine upon the operating table.  The patient received IV antibiotics prior to induction.  After obtaining adequate  anesthesia, a left first ray block was performed in a Mayo type block with 20 cc of quarter percent Marcaine plain.  The patient was prepped and draped in the standard fashion.  A tourniquet was applied but not used during the case.  Attention was then directed to the left hallux where a fishmouth a racquet type incision was made over the area slightly distal to the first metatarsal phalangeal joint.  The incision was made straight to bone.  At this time an extensor tenotomy and capsulotomy was performed followed by release the collateral and suspensory ligaments as well as the flexor tendon and plantar plate and the hallux was disarticulated from the metatarsal and passed off the operative site.  A wound culture was taken from the open wound at the IPJ hallux which appeared to have bone exposed.  The big toe was then placed in the specimen jar with the proximal margin marked in purple ink and sent off to pathology.  There appeared to be some mild bleeding during the procedure and Bovie was used to cauterize any bleeding vessels and hemostasis appeared to be well achieved.  A flush was performed with copious amounts normal sterile saline.  The incision was then reapproximated well coapted with a combination of horizontal mattress and simple type stitching with 3-0 nylon.  The capillary fill time appeared to be intact to the dorsal and plantar flaps at the operative site.  A postoperative dressing was then applied consisting of Xeroform followed by 4 x 4 gauze, Kerlix, and Ace wrap with minimal compression.  Patient tolerated the procedure and anesthesia well was transferred to recovery room vital signs stable vascular status  appearing to be intact to the digits of the left foot.  Patient will be transferred to the recovery room and then subsequently be placed back in the inpatient room with the appropriate orders that were placed postoperatively.  Patient is set for revascularization again tomorrow per  vascular.  COMPLICATIONS: None  CONDITION: Good, stable  Rosetta Posner, DPM

## 2020-04-09 ENCOUNTER — Other Ambulatory Visit: Payer: Self-pay

## 2020-04-09 ENCOUNTER — Encounter: Payer: Self-pay | Admitting: Podiatry

## 2020-04-09 ENCOUNTER — Encounter
Admission: EM | Disposition: A | Discharge status: Home / Self Care | Payer: Self-pay | Source: Home / Self Care | Attending: Internal Medicine

## 2020-04-09 HISTORY — PX: LOWER EXTREMITY ANGIOGRAPHY: CATH118251

## 2020-04-09 LAB — CBC
HCT: 33.2 % — ABNORMAL LOW (ref 39.0–52.0)
Hemoglobin: 11.6 g/dL — ABNORMAL LOW (ref 13.0–17.0)
MCH: 32.8 pg (ref 26.0–34.0)
MCHC: 34.9 g/dL (ref 30.0–36.0)
MCV: 93.8 fL (ref 80.0–100.0)
Platelets: 284 10*3/uL (ref 150–400)
RBC: 3.54 MIL/uL — ABNORMAL LOW (ref 4.22–5.81)
RDW: 12.8 % (ref 11.5–15.5)
WBC: 10.3 10*3/uL (ref 4.0–10.5)
nRBC: 0 % (ref 0.0–0.2)

## 2020-04-09 LAB — BASIC METABOLIC PANEL
Anion gap: 7 (ref 5–15)
BUN: 18 mg/dL (ref 6–20)
CO2: 20 mmol/L — ABNORMAL LOW (ref 22–32)
Calcium: 9.4 mg/dL (ref 8.9–10.3)
Chloride: 104 mmol/L (ref 98–111)
Creatinine, Ser: 1.16 mg/dL (ref 0.61–1.24)
GFR calc Af Amer: >60 mL/min (ref >60)
GFR calc non Af Amer: >60 mL/min (ref >60)
Glucose, Bld: 204 mg/dL — ABNORMAL HIGH (ref 70–99)
Potassium: 4.6 mmol/L (ref 3.5–5.1)
Sodium: 131 mmol/L — ABNORMAL LOW (ref 135–145)

## 2020-04-09 LAB — GLUCOSE, CAPILLARY
Glucose-Capillary: 200 mg/dL — ABNORMAL HIGH (ref 70–99)
Glucose-Capillary: 179 mg/dL — ABNORMAL HIGH (ref 70–99)
Glucose-Capillary: 276 mg/dL — ABNORMAL HIGH (ref 70–99)
Glucose-Capillary: 200 mg/dL — ABNORMAL HIGH (ref 70–99)

## 2020-04-09 LAB — VANCOMYCIN, TROUGH: Vancomycin Tr: 10 ug/mL — ABNORMAL LOW (ref 15–20)

## 2020-04-09 SURGERY — LOWER EXTREMITY ANGIOGRAPHY
Anesthesia: Moderate Sedation | Laterality: Left

## 2020-04-09 MED ORDER — FENTANYL CITRATE (PF) 100 MCG/2ML IJ SOLN
INTRAMUSCULAR | Status: DC | PRN
  Administered 2020-04-09 (×3): 50 ug via INTRAVENOUS

## 2020-04-09 MED ORDER — VANCOMYCIN HCL IN DEXTROSE 1-5 GM/200ML-% IV SOLN
1000.0000 mg | Freq: Two times a day (BID) | INTRAVENOUS | Status: DC
  Administered 2020-04-09 – 2020-04-10 (×2): 1000 mg via INTRAVENOUS
  Filled 2020-04-09 (×4): qty 200

## 2020-04-09 MED ORDER — HEPARIN SODIUM (PORCINE) 1000 UNIT/ML IJ SOLN
INTRAMUSCULAR | Status: DC | PRN
  Administered 2020-04-09: 5000 [IU] via INTRAVENOUS

## 2020-04-09 MED ORDER — MIDAZOLAM HCL 5 MG/5ML IJ SOLN
INTRAMUSCULAR | Status: AC
  Filled 2020-04-09: qty 5

## 2020-04-09 MED ORDER — HEPARIN SODIUM (PORCINE) 1000 UNIT/ML IJ SOLN
INTRAMUSCULAR | Status: AC
  Filled 2020-04-09: qty 1

## 2020-04-09 MED ORDER — FENTANYL CITRATE (PF) 100 MCG/2ML IJ SOLN
INTRAMUSCULAR | Status: AC
  Filled 2020-04-09: qty 2

## 2020-04-09 MED ORDER — ONDANSETRON HCL 4 MG/2ML IJ SOLN: 4.0000 mg | Freq: Four times a day (QID) | INTRAMUSCULAR | Status: DC | PRN

## 2020-04-09 MED ORDER — DIPHENHYDRAMINE HCL 50 MG/ML IJ SOLN: 50.0000 mg | Freq: Once | INTRAMUSCULAR | Status: DC | PRN

## 2020-04-09 MED ORDER — CEFAZOLIN SODIUM-DEXTROSE 2-4 GM/100ML-% IV SOLN
INTRAVENOUS | Status: AC
  Filled 2020-04-09: qty 100

## 2020-04-09 MED ORDER — ASPIRIN EC 325 MG PO TBEC
DELAYED_RELEASE_TABLET | ORAL | Status: AC
  Filled 2020-04-09: qty 2

## 2020-04-09 MED ORDER — CLOPIDOGREL BISULFATE 75 MG PO TABS
450.0000 mg | ORAL_TABLET | ORAL | Status: AC
  Administered 2020-04-09: 450 mg via ORAL

## 2020-04-09 MED ORDER — CEFAZOLIN SODIUM-DEXTROSE 1-4 GM/50ML-% IV SOLN
1.0000 g | Freq: Once | INTRAVENOUS | Status: AC
  Administered 2020-04-09: 1 g via INTRAVENOUS
  Filled 2020-04-09: qty 50

## 2020-04-09 MED ORDER — METHYLPREDNISOLONE SODIUM SUCC 125 MG IJ SOLR: 125.0000 mg | Freq: Once | INTRAMUSCULAR | Status: DC | PRN

## 2020-04-09 MED ORDER — MIDAZOLAM HCL 2 MG/ML PO SYRP: 8.0000 mg | ORAL_SOLUTION | Freq: Once | ORAL | Status: DC | PRN

## 2020-04-09 MED ORDER — CLOPIDOGREL BISULFATE 75 MG PO TABS
ORAL_TABLET | ORAL | Status: AC
  Filled 2020-04-09: qty 6

## 2020-04-09 MED ORDER — ENOXAPARIN SODIUM 40 MG/0.4ML ~~LOC~~ SOLN
40.0000 mg | SUBCUTANEOUS | Status: DC
  Administered 2020-04-10: 40 mg via SUBCUTANEOUS
  Filled 2020-04-09: qty 0.4

## 2020-04-09 MED ORDER — FAMOTIDINE 20 MG PO TABS: 40.0000 mg | ORAL_TABLET | Freq: Once | ORAL | Status: DC | PRN

## 2020-04-09 MED ORDER — CLOPIDOGREL BISULFATE 75 MG PO TABS
75.0000 mg | ORAL_TABLET | Freq: Every day | ORAL | Status: DC
  Administered 2020-04-10: 75 mg via ORAL
  Filled 2020-04-09: qty 1

## 2020-04-09 MED ORDER — ASPIRIN EC 81 MG PO TBEC
81.0000 mg | DELAYED_RELEASE_TABLET | Freq: Every day | ORAL | Status: DC
  Administered 2020-04-10: 81 mg via ORAL
  Filled 2020-04-09: qty 1

## 2020-04-09 MED ORDER — MIDAZOLAM HCL 2 MG/2ML IJ SOLN
INTRAMUSCULAR | Status: DC | PRN
  Administered 2020-04-09 (×2): 1 mg via INTRAVENOUS
  Administered 2020-04-09: 2 mg via INTRAVENOUS

## 2020-04-09 MED ORDER — SODIUM CHLORIDE 0.9 % IV SOLN
INTRAVENOUS | Status: DC
  Administered 2020-04-09: 1000 mL via INTRAVENOUS

## 2020-04-09 MED ORDER — IODIXANOL 320 MG/ML IV SOLN
INTRAVENOUS | Status: DC | PRN
  Administered 2020-04-09: 20 mL via INTRA_ARTERIAL

## 2020-04-09 MED ORDER — HYDROMORPHONE HCL 1 MG/ML IJ SOLN: 1.0000 mg | Freq: Once | INTRAMUSCULAR | Status: DC | PRN

## 2020-04-09 MED ORDER — ASPIRIN 325 MG PO TABS
650.0000 mg | ORAL_TABLET | ORAL | Status: AC
  Administered 2020-04-09: 650 mg via ORAL

## 2020-04-09 SURGICAL SUPPLY — 17 items
BALLN ULTRSCOR 014 2.5X200X150 (BALLOONS) ×3
BALLN ULTRVRSE 2X220X150 (BALLOONS) ×3
BALLOON ULTRSC 014 2.5X200X150 (BALLOONS) ×1 IMPLANT
BALLOON ULTRVRSE 2X220X150 (BALLOONS) ×1 IMPLANT
CATH MICROCATH PRGRT 2.8F 110 (CATHETERS) ×1 IMPLANT
DEVICE PRESTO INFLATION (MISCELLANEOUS) ×3 IMPLANT
DRAPE INCISE IOBAN 66X45 STRL (DRAPES) ×3 IMPLANT
DRAPE TABLE BACK 80X90 (DRAPES) ×3 IMPLANT
MICROCATH PROGREAT 2.8F 110 CM (CATHETERS) ×3
NEEDLE ENTRY 21GA 7CM ECHOTIP (NEEDLE) ×3 IMPLANT
PACK ANGIOGRAPHY (CUSTOM PROCEDURE TRAY) ×3 IMPLANT
SET INTRO CAPELLA COAXIAL (SET/KITS/TRAYS/PACK) ×3 IMPLANT
SHEATH HALO 035 5FRX10 (SHEATH) ×3 IMPLANT
SHEATH MICROPUNCTURE PEDAL 4FR (SHEATH) ×3 IMPLANT
WIRE G 018X200 V18 (WIRE) ×3 IMPLANT
WIRE MAGIC TOR.035 180C (WIRE) ×3 IMPLANT
WIRE RUNTHROUGH .014X300CM (WIRE) ×3 IMPLANT

## 2020-04-09 NOTE — Progress Notes (Signed)
PROGRESS NOTE  Marc [name] JXB:[id number] DOB: 1961-02-27 DOA: 04/05/2020 PCP: [facility], [facility]  HPI/Recap of past 24 hours: HPI from Dr Chipper Herb Hilliard Marc [name] a 58 y.o.malewith medical history significant ofdiabetes, systolic dysfunction CHF, hypertension, hyperlipidemia, medication noncompliance, coronary artery disease, ongoing tobacco abuse as well as previous diabetic foot ulcer with amputation on the right toe the great 1 who is being followed by podiatry for a similar left toe also around the big toe. X-ray of the toe showed possible osteomyelitis.  He was started on cefepime and vancomycin.  Patient admitted for further management.    Today, patient denies any new complaints.   Assessment/Plan: Principal Problem:   Cellulitis and abscess of toe of left foot Active Problems:   Chronic systolic heart failure (HCC)   Hypertension   Hyperlipidemia   PAF (paroxysmal atrial fibrillation) (HCC)   Coronary artery disease   Diabetes (HCC)   Left great toe osteomyelitis with gangrene and associated cellulitis with ulceration to bone status post amputation on 04/08/2020 Peripheral vascular disease S/p left extremity angiogram with angioplasty of left posterior tibial artery Currently afebrile, with resolving leukocytosis X-ray left foot showed mild gas in the soft tissues at the first IP joint likely due to ulcer, no definitive radiographic evidence of osteomyelitis Podiatry on board, status post amputation on 04/08/2020 Vascular surgery on board s/p left lower extremity angiogram, with successful recanalization left lower extremity for limb salvage Start Plavix, aspirin, continue statin Continue IV cefepime, vancomycin, plan to de-escalate/discontinue pending deep tissue culture  Diabetes mellitus type 2, uncontrolled with hyperglycemia A1c 7.3 SSI, Accu-Cheks, hypoglycemic protocol  Hypertension BP stable Continue Coreg, amlodipine, clonidine  CKD stage  IIIa with metabolic acidosis Creatinine at baseline Daily BMP  Chronic systolic HF Appears euvolemic Continue Coreg  Paroxysmal A. Fib Heart rate controlled Continue Coreg Not on any anticoagulation at home  Tobacco abuse Advised to quit  Obesity Lifestyle modification advised        Malnutrition Type:      Malnutrition Characteristics:      Nutrition Interventions:       Estimated body mass index is 32.32 kg/m as calculated from the following:   Height as of this encounter: 6\' 1"  (1.854 m).   Weight as of this encounter: 111.1 kg.     Code Status: Full  Family Communication: Daughter-in-law at bedside on 04/08/2020  Disposition Plan: Status is: Inpatient  Remains inpatient appropriate because:Inpatient level of care appropriate due to severity of illness   Dispo: The patient is from: Home              Anticipated d/c is to: Home              Anticipated d/c date is: 2 days              Patient currently is not medically stable to d/c.    Consultants:  Vascular surgery  Podiatry  Procedures:  Left lower extremity angiogram  Left great toe amputation  Antimicrobials:  IV cefepime  Vancomycin  DVT prophylaxis: Lovenox   Objective: Vitals:   04/09/20 1441 04/09/20 1445 04/09/20 1500 04/09/20 1515  BP: 121/76 106/80 129/86 (!) 141/74  Pulse: 60 61    Resp: 18 18 (!) 27 18  Temp:      TempSrc:      SpO2: 96% 96%    Weight:      Height:        Intake/Output Summary (Last 24 hours) at  04/09/2020 1722 Last data filed at 04/09/2020 0452 Gross per 24 hour  Intake 598.94 ml  Output --  Net 598.94 ml   Filed Weights   04/05/20 1659 04/09/20 1143  Weight: 111.1 kg 111.1 kg    Exam:  General: NAD   Cardiovascular: S1, S2 present  Respiratory: CTAB  Abdomen: Soft, nontender, nondistended, bowel sounds present  Musculoskeletal: Left foot dressing C/D/I  Skin: Normal  Psychiatry: Normal mood    Data  Reviewed: CBC: Recent Labs  Lab 04/05/20 1741 04/06/20 0423 04/07/20 0435 04/08/20 0656 04/09/20 0428  WBC 17.3* 10.0 9.4 8.9 10.3  HGB 13.6 11.0* 11.6* 11.0* 11.6*  HCT 37.9* 32.9* 34.1* 32.9* 33.2*  MCV 92.2 95.9 96.3 96.2 93.8  PLT 338 296 299 278 284   Basic Metabolic Panel: Recent Labs  Lab 04/05/20 1741 04/06/20 0423 04/07/20 0652 04/08/20 0656 04/09/20 0428  NA 133* 133* 132* 129* 131*  K 4.9 4.4 4.8 4.6 4.6  CL 103 104 104 102 104  CO2 21* 21* 20* 19* 20*  GLUCOSE 73 58* 177* 196* 204*  BUN 34* 33* 29* 23* 18  CREATININE 1.40* 1.29* 1.21 0.98 1.16  CALCIUM 10.9* 9.7 9.3 9.5 9.4  MG  --   --  1.8  --   --    GFR: Estimated Creatinine Clearance: 88.5 mL/min (by C-G formula based on SCr of 1.16 mg/dL). Liver Function Tests: Recent Labs  Lab 04/06/20 0423  AST 12*  ALT 16  ALKPHOS 48  BILITOT 0.9  PROT 6.6  ALBUMIN 3.5   No results for input(s): LIPASE, AMYLASE in the last 168 hours. No results for input(s): AMMONIA in the last 168 hours. Coagulation Profile: No results for input(s): INR, PROTIME in the last 168 hours. Cardiac Enzymes: No results for input(s): CKTOTAL, CKMB, CKMBINDEX, TROPONINI in the last 168 hours. BNP (last 3 results) No results for input(s): PROBNP in the last 8760 hours. HbA1C: No results for input(s): HGBA1C in the last 72 hours. CBG: Recent Labs  Lab 04/08/20 2124 04/08/20 2125 04/09/20 0758 04/09/20 1142 04/09/20 1642  GLUCAP 137* 143* 200* 179* 276*   Lipid Profile: No results for input(s): CHOL, HDL, LDLCALC, TRIG, CHOLHDL, LDLDIRECT in the last 72 hours. Thyroid Function Tests: No results for input(s): TSH, T4TOTAL, FREET4, T3FREE, THYROIDAB in the last 72 hours. Anemia Panel: No results for input(s): VITAMINB12, FOLATE, FERRITIN, TIBC, IRON, RETICCTPCT in the last 72 hours. Urine analysis:    Component Value Date/Time   COLORURINE Yellow 09/18/2014 2018   COLORURINE YELLOW 09/12/2013 1555   APPEARANCEUR  Clear 09/18/2014 2018   LABSPEC 1.005 09/18/2014 2018   PHURINE 6.0 09/18/2014 2018   PHURINE 5.5 09/12/2013 1555   GLUCOSEU 150 mg/dL 25/42/7062 3762   HGBUR Negative 09/18/2014 2018   HGBUR SMALL (A) 09/12/2013 1555   BILIRUBINUR Negative 09/18/2014 2018   KETONESUR Negative 09/18/2014 2018   KETONESUR 15 (A) 09/12/2013 1555   PROTEINUR 100 mg/dL 83/15/1761 6073   PROTEINUR 100 (A) 09/12/2013 1555   UROBILINOGEN 0.2 09/12/2013 1555   NITRITE Negative 09/18/2014 2018   NITRITE NEGATIVE 09/12/2013 1555   LEUKOCYTESUR Negative 09/18/2014 2018   Sepsis Labs: @LABRCNTIP (procalcitonin:4,lacticidven:4)  ) Recent Results (from the past 240 hour(s))  Aerobic/Anaerobic Culture (surgical/deep wound)     Status: None   Collection Time: 04/05/20  4:54 PM   Specimen: Ulcer  Result Value Ref Range Status   Specimen Description   Final    ULCER Performed at [facility], [address]  [address], [place], [place] [address]    Special Requests   Final    NONE Performed at [facility], [address] [place], [place] [address]    Gram Stain   Final    FEW WBC PRESENT, PREDOMINANTLY PMN MODERATE GRAM POSITIVE COCCI FEW GRAM NEGATIVE RODS Performed at [facility], [address] [place], [place] [address]    Culture   Final    ABUNDANT PSEUDOMONAS AERUGINOSA MIXED ANAEROBIC FLORA PRESENT.  CALL LAB IF FURTHER IID REQUIRED.    Report Status 04/08/2020 FINAL  Final   Organism ID, Bacteria PSEUDOMONAS AERUGINOSA  Final      Susceptibility   Pseudomonas aeruginosa - MIC*    CEFTAZIDIME 4 SENSITIVE Sensitive     CIPROFLOXACIN <=0.25 SENSITIVE Sensitive     GENTAMICIN 2 SENSITIVE Sensitive     IMIPENEM 2 SENSITIVE Sensitive     PIP/TAZO <=4 SENSITIVE Sensitive     CEFEPIME 2 SENSITIVE Sensitive     * ABUNDANT PSEUDOMONAS AERUGINOSA  Blood culture (routine x 2)     Status: None (Preliminary result)   Collection Time: 04/05/20  6:57 PM   Specimen: BLOOD  Result  Value Ref Range Status   Specimen Description BLOOD BLOOD RIGHT HAND  Final   Special Requests   Final    BOTTLES DRAWN AEROBIC AND ANAEROBIC Blood Culture adequate volume   Culture   Final    NO GROWTH 4 DAYS Performed at [facility], [address] [place], [place] [address]    Report Status PENDING  Incomplete  Blood culture (routine x 2)     Status: None (Preliminary result)   Collection Time: 04/05/20  6:57 PM   Specimen: BLOOD  Result Value Ref Range Status   Specimen Description BLOOD RIGHT ANTECUBITAL  Final   Special Requests   Final    BOTTLES DRAWN AEROBIC AND ANAEROBIC Blood Culture adequate volume   Culture   Final    NO GROWTH 4 DAYS Performed at [facility], [address] [place], [place] [address]    Report Status PENDING  Incomplete  SARS Coronavirus 2 by RT PCR (hospital order, performed in [facility] hospital lab) Nasopharyngeal Nasopharyngeal Swab     Status: None   Collection Time: 04/05/20  6:57 PM   Specimen: Nasopharyngeal Swab  Result Value Ref Range Status   SARS Coronavirus 2 NEGATIVE NEGATIVE Final    Comment: (NOTE) SARS-CoV-2 target nucleic acids are NOT DETECTED.  The SARS-CoV-2 RNA is generally detectable in upper and lower respiratory specimens during the acute phase of infection. The lowest concentration of SARS-CoV-2 viral copies this assay can detect is 250 copies / mL. A negative result does not preclude SARS-CoV-2 infection and should not be used as the sole basis for treatment or other patient management decisions.  A negative result may occur with improper specimen collection / handling, submission of specimen other than nasopharyngeal swab, presence of viral mutation(s) within the areas targeted by this assay, and inadequate number of viral copies (<250 copies / mL). A negative result must be combined with clinical observations, patient history, and epidemiological information.  Fact Sheet for Patients:    BoilerBrush.com.cy  Fact Sheet for Healthcare Providers: https://pope.com/  This test is not yet approved or  cleared by the [place] FDA and has been authorized for detection and/or diagnosis of SARS-CoV-2 by FDA under an Emergency Use Authorization (EUA).  This EUA will remain in effect (meaning this test can be  used) for the duration of the COVID-19 declaration under Section 564(b)(1) of the Act, 21 U.S.C. section 360bbb-3(b)(1), unless the authorization is terminated or revoked sooner.  Performed at [facility], [address] [place], [place] [address]   Aerobic/Anaerobic Culture (surgical/deep wound)     Status: None (Preliminary result)   Collection Time: 04/08/20  5:39 PM   Specimen: PATH Traumatic digit amputation; Tissue  Result Value Ref Range Status   Specimen Description   Final    TOE GREAT LEFT Performed at [facility], [address], [place], [place] [address]    Special Requests   Final    NONE Performed at [facility], [address] [place], [place] [address]    Gram Stain   Final    NO WBC SEEN NO ORGANISMS SEEN Performed at [facility], [address] [place], [place] [address]    Culture PENDING  Incomplete   Report Status PENDING  Incomplete      Studies: PERIPHERAL VASCULAR CATHETERIZATION  Result Date: 04/09/2020 See Op Note  DG Foot 2 Views Left  Result Date: 04/08/2020 CLINICAL DATA:  Postoperative evaluation. EXAM: LEFT FOOT - 2 VIEW COMPARISON:  April 05, 2020 FINDINGS: Since the prior study there is been surgical amputation of the proximal and distal phalanges of the left great toe. There is no evidence of acute fracture or dislocation. Chronic changes are seen involving the distal aspect of the fourth and fifth left metatarsals. There is a large plantar calcaneal spur. Mild soft tissue swelling is seen along the dorsal aspect of the mid left foot.  IMPRESSION: Interval surgical amputation of the proximal and distal phalanges of the left great toe. Electronically Signed   By: Aram Candela M.D.   On: 04/08/2020 20:25    Scheduled Meds:  amLODipine  10 mg Oral Daily   aspirin EC       [START ON 04/10/2020] aspirin EC  81 mg Oral Daily   atorvastatin  10 mg Oral q1800   carvedilol  25 mg Oral BID WC   cloNIDine  0.3 mg Oral BID   clopidogrel       [START ON 04/10/2020] clopidogrel  75 mg Oral Daily   fentaNYL       fentaNYL       heparin sodium (porcine)       insulin aspart  0-15 Units Subcutaneous TID WC   insulin aspart  0-5 Units Subcutaneous QHS   midazolam       sodium chloride flush  3 mL Intravenous Q12H    Continuous Infusions:  ceFAZolin     ceFEPime (MAXIPIME) IV 2 g (04/09/20 0617)   vancomycin       LOS: 4 days     Briant Cedar, MD [facility]  If 7PM-7AM, please contact night-coverage [email or web address] 04/09/2020, 5:22 PM

## 2020-04-09 NOTE — Progress Notes (Signed)
Pharmacy Antibiotic Note  Marc [name] is a 59 y.o. male admitted on 04/05/2020 with cellulitis.  Pharmacy has been consulted for Cefepime and Vancomycin dosing.  8/3 X-rays appears to have some subcutaneous gas present at the level ulceration but the ulceration appears to be open and some of it that is present is likely due to area from the ulcer. No obvious osteomyelitis present but ulcer appears to open to bone. Ulceration appears to be necrotic the level of bone consistent with osteomyelitic and gangrenous changes.  8/5 pt had left hallux amputation  8/6 Patient's renal function was elevated upon admission but has improved and is now stable. Dr. Sharolyn Douglas would like to keep abx for now until she is able to discuss with podiatry and might discontinue from there   Plan: Vanc trough= 10 mcg/ml. Will adjust Vancomycin to 1000mg   IV every 12 hours. Goal trough 10-15 mcg/mL.  Continue Cefepime 2g IV q8h  Continue to monitor renal function and adjust as needed.   Height: 6\' 1"  (185.4 cm) Weight: 111.1 kg (245 lb) IBW/kg (Calculated) : 79.9  Temp (24hrs), Avg:97.9 F (36.6 C), Min:97.6 F (36.4 C), Max:98.4 F (36.9 C)  Recent Labs  Lab 04/05/20 1741 04/06/20 0423 04/07/20 0435 04/07/20 0652 04/08/20 0656 04/09/20 0428 04/09/20 0921  WBC 17.3* 10.0 9.4  --  8.9 10.3  --   CREATININE 1.40* 1.29*  --  1.21 0.98 1.16  --   VANCOTROUGH  --   --   --   --   --   --  10*    Estimated Creatinine Clearance: 88.5 mL/min (by C-G formula based on SCr of 1.16 mg/dL).    No Known Allergies  Antimicrobials this admission: Cefepime 8/2 >>  Vancomycin 8/2 >>  Cefazolin x1 8/6   8/6 Vanc trough= 10 mcg/ml at 0921.  Adj Vanc from 750 q12h to 1000 mg q12h  Microbiology results: 8/2 BCx: NGTD 8/5 Wcx: pending  Thank you for allowing pharmacy to be a part of this patient's care.  06/09/20 PharmD Clinical Pharmacist 04/09/2020

## 2020-04-09 NOTE — Progress Notes (Signed)
Patient currently out of room due to being a vascular procedure upon entry to room today.  Patient to continue to keep dressing clean, dry, and intact.  Will come back tomorrow to reevaluate patient and change dressing.  Patient ambulate with heel contact in a surgical shoe.  Appreciate recommendations per medicine for continued antibiotic therapy.  White blood cell count 10.3.  Culture from surgery currently with no growth.  Pathology pending.  Previous culture from wound grew Pseudomonas.

## 2020-04-09 NOTE — Op Note (Signed)
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  Introduction catheter into left lower extremity 2rd order catheter placement  2. Contrast injection left lower extremity for distal runoff  3.  Ultrasound-guided access to the left posterior tibial artery             4.   Percutaneous transluminal angioplasty left posterior tibial artery   Anesthesia: Conscious sedation was administered under my direct supervision by the interventional radiology RN. IV Versed plus fentanyl were utilized. Continuous ECG, pulse oximetry and blood pressure was monitored throughout the entire procedure.  Conscious sedation was for a total of 50 minutes.  Sheath: 5 French halo retrograde left posterior tibial artery  Contrast: 20 cc  Fluoroscopy Time: 5.2 minutes  Indications: Marc [name] presents with increasing pain of the left lower extremity.  This is associated with ulceration of the left foot he is undergoing debridement by podiatry.  This suggests the patient is having limb threatening ischemia. The risks and benefits are reviewed all questions answered patient agrees to proceed.  Procedure:Marc [name] is a 59 y.o. y.o. male who was identified and appropriate procedural time out was performed. The patient was then placed supine on the table and prepped and draped in the usual sterile fashion.   Ultrasound brought to the field in a sterile sleeve.  The posterior tibial artery was identified by ultrasound.  1% lidocaine was infiltrated in the soft tissues.  A micropuncture needle was then used to access the posterior tibial artery.  Image was recorded for the  permanent record.  Microwire followed by a micro-sheath was then inserted.  A 0.035 wire was then advanced in a retrograde fashion through the tibial and 5 French halo sheath was inserted.  Using a combination of the V 18 wire and then a prograde catheter the occlusion of the posterior tibial was crossed.  Hand-injection of contrast through the prograde catheter in the mid popliteal artery demonstrates the posterior tibial disease.  The peroneal remains widely patent.  A 2 mm x 200 mm Ultraverse balloon was used to angioplasty the posterior tibial.  Subsequently a 2.5 mm x 200 ultra score balloon was used to treat the posterior tibial.  Each inflation was for 1 minute at 12 atm. Follow-up imaging demonstrated excellent patency of the posterior tibial with less than 10% residual stenosis.  There is now direct inline flow to the pedal arch.  After review of these images the sheath is pulled and direct pressure is held. There no immediate Complications.  Findings:  Initial imaging demonstrates diffuse disease throughout the length of the posterior tibial with occlusion of the mid one third.  The popliteal artery is widely patent previous interventional site demonstrates excellent flow.  The peroneal remains widely patent.  Following angioplasty 1st-2 millimeters and then 2.5 mm there is now successful recanalization with less than 10% residual stenosis throughout the entire length of the posterior tibial artery.  Summary: Successful recanalization left lower extremity for limb salvage   Disposition: Patient was taken to the recovery room in stable condition having tolerated the procedure well.  Marc [name] 04/09/2020,3:34 PM

## 2020-04-09 NOTE — Progress Notes (Signed)
Pharmacy Antibiotic Note  Marc [name] is a 59 y.o. male admitted on 04/05/2020 with cellulitis.  Pharmacy has been consulted for Cefepime and Vancomycin dosing.  8/3 X-rays appears to have some subcutaneous gas present at the level ulceration but the ulceration appears to be open and some of it that is present is likely due to area from the ulcer. No obvious osteomyelitis present but ulcer appears to open to bone. Ulceration appears to be necrotic the level of bone consistent with osteomyelitic and gangrenous changes.  8/5 pt had left hallux amputation  8/6 Patient's renal function was elevated upon admission but has improved and is now stable. Dr. Sharolyn Douglas would like to keep abx for now until she is able to discuss with podiatry and might discontinue from there   Plan: Continue Vancomycin 750 IV every 12 hours. Goal trough 10-15 mcg/mL. Will hold off on ordering a vanc level with the potential of discontinuing abx  Continue Cefepime 2g IV q8h  Continue to monitor renal function and adjust as needed.   Height: 6\' 1"  (185.4 cm) Weight: 111.1 kg (245 lb) IBW/kg (Calculated) : 79.9  Temp (24hrs), Avg:97.9 F (36.6 C), Min:97.6 F (36.4 C), Max:98.4 F (36.9 C)  Recent Labs  Lab 04/05/20 1741 04/06/20 0423 04/07/20 0435 04/07/20 0652 04/08/20 0656 04/09/20 0428  WBC 17.3* 10.0 9.4  --  8.9 10.3  CREATININE 1.40* 1.29*  --  1.21 0.98 1.16    Estimated Creatinine Clearance: 88.5 mL/min (by C-G formula based on SCr of 1.16 mg/dL).    No Known Allergies  Antimicrobials this admission: Cefepime 8/2 >>  Vancomycin 8/2 >>  Cefazolin x1 8/6  Microbiology results: 8/2 BCx: NGTD 8/5 Wcx: pending  Thank you for allowing pharmacy to be a part of this patient's care.  10/2, PharmD Pharmacy Resident  04/09/2020 12:30 PM

## 2020-04-10 LAB — CULTURE, BLOOD (ROUTINE X 2)
Culture: NO GROWTH
Culture: NO GROWTH
Special Requests: ADEQUATE
Special Requests: ADEQUATE

## 2020-04-10 LAB — CBC
HCT: 30.4 % — ABNORMAL LOW (ref 39.0–52.0)
Hemoglobin: 10.3 g/dL — ABNORMAL LOW (ref 13.0–17.0)
MCH: 32.3 pg (ref 26.0–34.0)
MCHC: 33.9 g/dL (ref 30.0–36.0)
MCV: 95.3 fL (ref 80.0–100.0)
Platelets: 259 10*3/uL (ref 150–400)
RBC: 3.19 MIL/uL — ABNORMAL LOW (ref 4.22–5.81)
RDW: 12.6 % (ref 11.5–15.5)
WBC: 7.7 10*3/uL (ref 4.0–10.5)
nRBC: 0 % (ref 0.0–0.2)

## 2020-04-10 LAB — BASIC METABOLIC PANEL
Anion gap: 7 (ref 5–15)
BUN: 20 mg/dL (ref 6–20)
CO2: 21 mmol/L — ABNORMAL LOW (ref 22–32)
Calcium: 9.1 mg/dL (ref 8.9–10.3)
Chloride: 101 mmol/L (ref 98–111)
Creatinine, Ser: 1.18 mg/dL (ref 0.61–1.24)
GFR calc Af Amer: >60 mL/min (ref >60)
GFR calc non Af Amer: >60 mL/min (ref >60)
Glucose, Bld: 290 mg/dL — ABNORMAL HIGH (ref 70–99)
Potassium: 4.4 mmol/L (ref 3.5–5.1)
Sodium: 129 mmol/L — ABNORMAL LOW (ref 135–145)

## 2020-04-10 LAB — GLUCOSE, CAPILLARY
Glucose-Capillary: 293 mg/dL — ABNORMAL HIGH (ref 70–99)
Glucose-Capillary: 221 mg/dL — ABNORMAL HIGH (ref 70–99)

## 2020-04-10 MED ORDER — CLOPIDOGREL BISULFATE 75 MG PO TABS: 75.0000 mg | ORAL_TABLET | Freq: Every day | ORAL | 0 refills | Status: DC

## 2020-04-10 MED ORDER — OXYCODONE HCL 5 MG PO TABS: 5.0000 mg | ORAL_TABLET | Freq: Four times a day (QID) | ORAL | 0 refills | Status: AC | PRN

## 2020-04-10 MED ORDER — CIPROFLOXACIN HCL 500 MG PO TABS
500.0000 mg | ORAL_TABLET | Freq: Two times a day (BID) | ORAL | 0 refills | Status: DC
Start: 2020-04-10 — End: 2020-04-10

## 2020-04-10 MED ORDER — DOXYCYCLINE MONOHYDRATE 100 MG PO TABS: 100.0000 mg | ORAL_TABLET | Freq: Two times a day (BID) | ORAL | 0 refills | Status: DC

## 2020-04-10 MED ORDER — OXYCODONE HCL 5 MG PO TABS: 5.0000 mg | ORAL_TABLET | Freq: Four times a day (QID) | ORAL | 0 refills | Status: DC | PRN

## 2020-04-10 MED ORDER — CIPROFLOXACIN HCL 500 MG PO TABS
500.0000 mg | ORAL_TABLET | Freq: Two times a day (BID) | ORAL | 0 refills | Status: AC
Start: 2020-04-10 — End: 2020-04-17

## 2020-04-10 MED ORDER — DOXYCYCLINE MONOHYDRATE 100 MG PO TABS: 100.0000 mg | ORAL_TABLET | Freq: Two times a day (BID) | ORAL | 0 refills | Status: AC

## 2020-04-10 MED ORDER — CLOPIDOGREL BISULFATE 75 MG PO TABS: 75.0000 mg | ORAL_TABLET | Freq: Every day | ORAL | 0 refills | Status: AC

## 2020-04-10 MED ORDER — CARVEDILOL 25 MG PO TABS: 12.5000 mg | ORAL_TABLET | Freq: Two times a day (BID) | ORAL | 6 refills | Status: DC

## 2020-04-10 NOTE — Discharge Summary (Signed)
Discharge Summary  Marc [name]:[id number] DOB: [birth year]  PCP: [facility]  Admit date: 04/05/2020 Discharge date: 04/10/2020  Time spent: 40 mins  Recommendations for Outpatient Follow-up:  1. PCP in 1 week 2. Podiatry as scheduled 3. Vascular surgery as scheduled  Discharge Diagnoses:  Active Hospital Problems   Diagnosis Date Noted  . Cellulitis and abscess of toe of left foot 04/05/2020  . Diabetes (HCC) 03/14/2018  . Coronary artery disease   . PAF (paroxysmal atrial fibrillation) (HCC) 07/29/2013  . Hypertension   . Hyperlipidemia   . Chronic systolic heart failure (HCC) 07/05/2013    Resolved Hospital Problems  No resolved problems to display.    Discharge Condition: Stable  Diet recommendation: Heart healthy/moderate carb  Vitals:   04/10/20 0850 04/10/20 1211  BP: 134/73 109/68  Pulse: (!) 54 (!) 47  Resp: 18 18  Temp:  97.6 F (36.4 C)  SpO2: 98% 100%    History of present illness:  Marc L Wilsonis a 58 y.o.malewith medical history significant ofdiabetes, systolic dysfunction CHF, hypertension, hyperlipidemia, medication noncompliance, coronary artery disease, ongoing tobacco abuse as well as previous diabetic foot ulcer with amputation on the right toe the great 1 who is being followed by podiatry for a similar left toe also around the big toe. X-ray of the toe showed possible osteomyelitis. He was started on cefepime and vancomycin. Patient admitted for further management.     Denied any new complaints this morning, misses his dog, eager to be discharged.  Discussed about follow-up appointment with PCP in 1 week, podiatry, vascular surgery as scheduled.  Encourage patient to quit smoking cigarettes.      Hospital Course:  Principal Problem:   Cellulitis and abscess of toe of left foot Active Problems:   Chronic systolic heart failure (HCC)   Hypertension   Hyperlipidemia   PAF (paroxysmal atrial fibrillation)  (HCC)   Coronary artery disease   Diabetes (HCC)   Left great toe osteomyelitis with gangrene and associated cellulitis with ulceration to bone status post amputation on 04/08/2020 Peripheral vascular disease S/p left extremity angiogram with angioplasty of left posterior tibial artery Currently afebrile, with resolving leukocytosis X-ray left foot showed mild gas in the soft tissues at the first IP joint likely due to ulcer, no definitive radiographic evidence of osteomyelitis Podiatry on board, status post amputation on 04/08/2020 Vascular surgery on board s/p left lower extremity angiogram, with successful recanalization left lower extremity for limb salvage Continue Plavix, aspirin, statin S/P IV cefepime, vancomycin, discharge on ciprofloxacin and doxycycline for an additional 7 days as per podiatry recommendation Follow-up with podiatry, vascular surgery  Diabetes mellitus type 2, uncontrolled with hyperglycemia A1c 7.3 Continue home regimen  Hypertension BP stable Continue reduced dose of Coreg 12.5 mg BID, amlodipine, clonidine Continue to hold lisinopril, and Aldactone due to soft BP, may restart both pending BP checks at home Follow-up with PCP  CKD stage IIIa with metabolic acidosis Creatinine at baseline, improving metabolic acidosis Follow-up with PCP  Chronic systolic HF Appears euvolemic Continue Coreg as mentioned above  Paroxysmal A. Fib Heart rate controlled Continue Coreg Not on any anticoagulation at home  Tobacco abuse Advised to quit  Obesity Lifestyle modification advised        Malnutrition Type:      Malnutrition Characteristics:      Nutrition Interventions:      Estimated body mass index is 32.32 kg/m as calculated from the following:   Height as of this encounter:  6\' 1"  (1.854 m).   Weight as of this encounter: 111.1 kg.    Procedures:  Status post amputation left great toe  Status post angiogram  LLE  Consultations:  Podiatry  Vascular surgery  Discharge Exam: BP 109/68 (BP Location: Left Arm)   Pulse (!) 47   Temp 97.6 F (36.4 C) (Oral)   Resp 18   Ht 6\' 1"  (1.854 m)   Wt 111.1 kg   SpO2 100%   BMI 32.32 kg/m   General: NAD Cardiovascular: S1, S2 present Respiratory: CTA B Musculoskeletal: Left foot dressing C/D/I    Discharge Instructions You were cared for by a hospitalist during your hospital stay. If you have any questions about your discharge medications or the care you received while you were in the hospital after you are discharged, you can call the unit and asked to speak with the hospitalist on call if the hospitalist that took care of you is not available. Once you are discharged, your primary care physician will handle any further medical issues. Please note that NO REFILLS for any discharge medications will be authorized once you are discharged, as it is imperative that you return to your primary care physician (or establish a relationship with a primary care physician if you do not have one) for your aftercare needs so that they can reassess your need for medications and monitor your lab values.  Discharge Instructions    Diet - low sodium heart healthy   Complete by: As directed    Discharge wound care:   Complete by: As directed    Wound care as instructed by podiatry   Increase activity slowly   Complete by: As directed      Allergies as of 04/10/2020   No Known Allergies     Medication List    STOP taking these medications   lisinopril 40 MG tablet Commonly known as: ZESTRIL   spironolactone 50 MG tablet Commonly known as: ALDACTONE     TAKE these medications   amLODipine 10 MG tablet Commonly known as: NORVASC Take 10 mg by mouth daily. for high blood pressure   aspirin EC 81 MG tablet Take 81 mg by mouth daily. Swallow whole.   atorvastatin 80 MG tablet Commonly known as: LIPITOR Take 1 tablet by mouth every evening.    carvedilol 25 MG tablet Commonly known as: COREG Take 0.5 tablets (12.5 mg total) by mouth 2 (two) times daily. What changed: how much to take   ciprofloxacin 500 MG tablet Commonly known as: Cipro Take 1 tablet (500 mg total) by mouth 2 (two) times daily for 7 days.   cloNIDine 0.3 MG tablet Commonly known as: CATAPRES Take 1 tablet by mouth 2 (two) times daily.   clopidogrel 75 MG tablet Commonly known as: PLAVIX Take 1 tablet (75 mg total) by mouth daily. Start taking on: April 11, 2020   doxycycline 100 MG tablet Commonly known as: ADOXA Take 1 tablet (100 mg total) by mouth 2 (two) times daily for 7 days.   glipiZIDE 10 MG 24 hr tablet Commonly known as: GLUCOTROL XL Take 1 tablet by mouth every morning.   Lantus SoloStar 100 UNIT/ML Solostar Pen Generic drug: insulin glargine Inject 52 Units into the skin every evening. What changed:   how much to take  when to take this   oxyCODONE 5 MG immediate release tablet Commonly known as: Oxy IR/ROXICODONE Take 1 tablet (5 mg total) by mouth every 6 (six) hours as needed for  up to 7 days for moderate pain.            Discharge Care Instructions  (From admission, onward)         Start     Ordered   04/10/20 0000  Discharge wound care:       Comments: Wound care as instructed by podiatry   04/10/20 1347         No Known Allergies  Follow-up Information    Rosetta Posner, DPM. Schedule an appointment as soon as possible for a visit in 1 week(s).   Specialty: Podiatry Contact information: [address] [place] [address] [phone]        Gilda Crease, Latina Craver, MD. Schedule an appointment as soon as possible for a visit.   Specialties: Vascular Surgery, Cardiology, Radiology, Vascular Surgery Contact information: [address] [place] [address] [phone]        [facility]. Schedule an appointment as soon as possible for a visit in 1 week(s).   Specialty:  General Practice Contact information: [address] [place] [address] [phone]                The results of significant diagnostics from this hospitalization (including imaging, microbiology, ancillary and laboratory) are listed below for reference.    Significant Diagnostic Studies: PERIPHERAL VASCULAR CATHETERIZATION  Result Date: 04/09/2020 See Op Note  PERIPHERAL VASCULAR CATHETERIZATION  Result Date: 04/07/2020 See op note  DG Foot 2 Views Left  Result Date: 04/08/2020 CLINICAL DATA:  Postoperative evaluation. EXAM: LEFT FOOT - 2 VIEW COMPARISON:  April 05, 2020 FINDINGS: Since the prior study there is been surgical amputation of the proximal and distal phalanges of the left great toe. There is no evidence of acute fracture or dislocation. Chronic changes are seen involving the distal aspect of the fourth and fifth left metatarsals. There is a large plantar calcaneal spur. Mild soft tissue swelling is seen along the dorsal aspect of the mid left foot. IMPRESSION: Interval surgical amputation of the proximal and distal phalanges of the left great toe. Electronically Signed   By: Aram Candela M.D.   On: 04/08/2020 20:25   DG Foot Complete Left  Result Date: 04/05/2020 CLINICAL DATA:  Cellulitis, ulcer to the great toe EXAM: LEFT FOOT - COMPLETE 3+ VIEW COMPARISON:  12/04/2013 FINDINGS: No acute displaced fracture or malalignment. Old fracture deformities of the third fourth and fifth distal metatarsals as before. Chronic erosive change at the head of the second metatarsal. Gas in the soft tissues at the first IP joint likely due to ulcer. No underlying bony destructive change. Large plantar calcaneal spur. Vascular calcifications. IMPRESSION: 1. Mild gas in the soft tissues at the first IP joint likely due to ulcer. No definite radiographic evidence for osteomyelitis. MRI follow-up should be considered if persistent clinical concern for osteomyelitis. 2. Chronic  deformities at the third through fifth metatarsals. Electronically Signed   By: Jasmine Pang M.D.   On: 04/05/2020 19:46    Microbiology: Recent Results (from the past 240 hour(s))  Aerobic/Anaerobic Culture (surgical/deep wound)     Status: None   Collection Time: 04/05/20  4:54 PM   Specimen: Ulcer  Result Value Ref Range Status   Specimen Description   Final    ULCER Performed at [facility], [address], [place], [place] [address]    Special Requests   Final    NONE Performed at [facility], [address] [place],  [place] [address]    Gram Stain   Final    FEW WBC PRESENT, PREDOMINANTLY PMN MODERATE GRAM POSITIVE COCCI FEW GRAM NEGATIVE RODS Performed at [facility], [address] [place], [place] [address]    Culture   Final    ABUNDANT PSEUDOMONAS AERUGINOSA MIXED ANAEROBIC FLORA PRESENT.  CALL LAB IF FURTHER IID REQUIRED.    Report Status 04/08/2020 FINAL  Final   Organism ID, Bacteria PSEUDOMONAS AERUGINOSA  Final      Susceptibility   Pseudomonas aeruginosa - MIC*    CEFTAZIDIME 4 SENSITIVE Sensitive     CIPROFLOXACIN <=0.25 SENSITIVE Sensitive     GENTAMICIN 2 SENSITIVE Sensitive     IMIPENEM 2 SENSITIVE Sensitive     PIP/TAZO <=4 SENSITIVE Sensitive     CEFEPIME 2 SENSITIVE Sensitive     * ABUNDANT PSEUDOMONAS AERUGINOSA  Blood culture (routine x 2)     Status: None   Collection Time: 04/05/20  6:57 PM   Specimen: BLOOD  Result Value Ref Range Status   Specimen Description BLOOD BLOOD RIGHT HAND  Final   Special Requests   Final    BOTTLES DRAWN AEROBIC AND ANAEROBIC Blood Culture adequate volume   Culture   Final    NO GROWTH 5 DAYS Performed at [facility], [address], [place], [place] [address]    Report Status 04/10/2020 FINAL  Final  Blood culture (routine x 2)     Status: None   Collection Time: 04/05/20  6:57 PM   Specimen: BLOOD  Result Value Ref Range Status   Specimen Description BLOOD RIGHT  ANTECUBITAL  Final   Special Requests   Final    BOTTLES DRAWN AEROBIC AND ANAEROBIC Blood Culture adequate volume   Culture   Final    NO GROWTH 5 DAYS Performed at [facility], [address] [place], [place] [address]    Report Status 04/10/2020 FINAL  Final  SARS Coronavirus 2 by RT PCR (hospital order, performed in [facility] hospital lab) Nasopharyngeal Nasopharyngeal Swab     Status: None   Collection Time: 04/05/20  6:57 PM   Specimen: Nasopharyngeal Swab  Result Value Ref Range Status   SARS Coronavirus 2 NEGATIVE NEGATIVE Final    Comment: (NOTE) SARS-CoV-2 target nucleic acids are NOT DETECTED.  The SARS-CoV-2 RNA is generally detectable in upper and lower respiratory specimens during the acute phase of infection. The lowest concentration of SARS-CoV-2 viral copies this assay can detect is 250 copies / mL. A negative result does not preclude SARS-CoV-2 infection and should not be used as the sole basis for treatment or other patient management decisions.  A negative result may occur with improper specimen collection / handling, submission of specimen other than nasopharyngeal swab, presence of viral mutation(s) within the areas targeted by this assay, and inadequate number of viral copies (<250 copies / mL). A negative result must be combined with clinical observations, patient history, and epidemiological information.  Fact Sheet for Patients:   BoilerBrush.com.cy  Fact Sheet for Healthcare Providers: https://pope.com/  This test is not yet approved or  cleared by the [place] FDA and has been authorized for detection and/or diagnosis of SARS-CoV-2 by FDA under an Emergency Use Authorization (EUA).  This EUA will remain in effect (meaning this test can be used) for the duration of the COVID-19 declaration under Section 564(b)(1) of the Act, 21 U.S.C. section 360bbb-3(b)(1), unless the authorization is  terminated or revoked sooner.  Performed at  [facility], [address] [place], [place] [address]   Aerobic/Anaerobic Culture (surgical/deep wound)     Status: None (Preliminary result)   Collection Time: 04/08/20  5:39 PM   Specimen: PATH Traumatic digit amputation; Tissue  Result Value Ref Range Status   Specimen Description   Final    TOE GREAT LEFT Performed at [facility], [address], [place], [place] [address]    Special Requests   Final    NONE Performed at [facility], [address] [place], [place] [address]    Gram Stain   Final    NO WBC SEEN NO ORGANISMS SEEN Performed at [facility], [address], [place], [place] [address]    Culture RARE GRAM NEGATIVE RODS  Final   Report Status PENDING  Incomplete     Labs: Basic Metabolic Panel: Recent Labs  Lab 04/06/20 0423 04/07/20 7510 04/08/20 0656 04/09/20 0428 04/10/20 0629  NA 133* 132* 129* 131* 129*  K 4.4 4.8 4.6 4.6 4.4  CL 104 104 102 104 101  CO2 21* 20* 19* 20* 21*  GLUCOSE 58* 177* 196* 204* 290*  BUN 33* 29* 23* 18 20  CREATININE 1.29* 1.21 0.98 1.16 1.18  CALCIUM 9.7 9.3 9.5 9.4 9.1  MG  --  1.8  --   --   --    Liver Function Tests: Recent Labs  Lab 04/06/20 0423  AST 12*  ALT 16  ALKPHOS 48  BILITOT 0.9  PROT 6.6  ALBUMIN 3.5   No results for input(s): LIPASE, AMYLASE in the last 168 hours. No results for input(s): AMMONIA in the last 168 hours. CBC: Recent Labs  Lab 04/06/20 0423 04/07/20 0435 04/08/20 0656 04/09/20 0428 04/10/20 0629  WBC 10.0 9.4 8.9 10.3 7.7  HGB 11.0* 11.6* 11.0* 11.6* 10.3*  HCT 32.9* 34.1* 32.9* 33.2* 30.4*  MCV 95.9 96.3 96.2 93.8 95.3  PLT 296 299 278 284 259   Cardiac Enzymes: No results for input(s): CKTOTAL, CKMB, CKMBINDEX, TROPONINI in the last 168 hours. BNP: BNP (last 3 results) No results for input(s): BNP in the last 8760 hours.  ProBNP (last 3 results) No results for input(s): PROBNP in  the last 8760 hours.  CBG: Recent Labs  Lab 04/09/20 1142 04/09/20 1642 04/09/20 2052 04/10/20 0746 04/10/20 1207  GLUCAP 179* 276* 200* 293* 221*       Signed:  Briant Cedar, MD [facility] 04/10/2020, 1:54 PM

## 2020-04-10 NOTE — Discharge Instructions (Signed)
  Document Revised: 01/10/2018 Document Reviewed: 01/10/2018 Elsevier Patient Education  2020 [employer]. Podiatry discharge instructions: 1.  Keep surgical dressings clean, dry, and intact until your postoperative visit.  If loosening or saturation occurs please call office for instruction on dressing change. 2.  Ambulate in surgical shoe to your left foot with heel contact only.  Please try to stay off left foot is much as possible.  Please elevate left foot when at rest to control swelling further. 3.  Take antibiotics as prescribed until gone. 4.  Please follow-up with Dr. Excell Seltzer within 1 week of your surgery date at [facility] clinic podiatry

## 2020-04-10 NOTE — Progress Notes (Signed)
OT Cancellation Note  Patient Details Name: Marc [name] MRN: [id number] DOB: 1961-06-16   Cancelled Treatment:    Reason Eval/Treat Not Completed: OT screened, no needs identified, will sign off. Order received and chart reviewed. Upon arrival to pt room, pt standing, dressed in street clothes with post-op shoe donned, awaiting hospital transport to take him for DC. Pt endorses he feels back to baseline level of functional independence for ADL management. He states he dressed himself independently and has been up to his commode w/o difficulty. Pt verbalizes understanding of WB orders with post-op shoe per podiatry. No skilled OT needs identified. Will sign off at this time. Please re-consult if additional OT needs arise during this admission.   Rockney Ghee, M.S., OTR/L Ascom: [phone] 04/10/20, 3:12 PM

## 2020-04-10 NOTE — Progress Notes (Signed)
PODIATRY / FOOT AND ANKLE SURGERY PROGRESS NOTE  Requesting Physician: Marrion Coy, MD  Reason for consult: Left hallux ulcer/infection  Chief Complaint: Left big toe sore   HPI: Marc [name] is a 59 y.o. male who presents today resting in bed comfortably s/p 2d L hallux amputation.  Patient denies any pain to his left big toe.  Patient doing well overall with no complaints.  Patient denies nausea, vomiting, fever, chills.  Patient does remain partial weightbearing with left heel contact in a surgical shoe.  PMHx:  Past Medical History:  Diagnosis Date  . Anxiety   . Cardiomyopathy (HCC)    45-50%  . CHF (congestive heart failure) (HCC)   . Chronic systolic heart failure (HCC) 07/2013   Ejection fraction of 45-50% with possible inferior wall hypokinesis  . Coronary artery disease    Significant left main and Severe three-vessel coronary artery disease in December of 2014. He underwent CABG in January of 2015 with LIMA to LAD, sequential SVG to ramus/OM 2 and SVG to OM 3  . Diabetes mellitus without complication (HCC)   . Dysrhythmia   . Hyperlipidemia   . Hypertension   . Medical non-compliance   . MI (myocardial infarction) (HCC)   . Obesity   . Tobacco abuse     Surgical Hx:  Past Surgical History:  Procedure Laterality Date  . AMPUTATION Left 04/08/2020   Procedure: AMPUTATION RAY;  Surgeon: Rosetta Posner, DPM;  Location: [facility] ORS;  Service: Podiatry;  Laterality: Left;  . AMPUTATION TOE Right 02/29/2016   Procedure: AMPUTATION TOE;  Surgeon: Linus Galas, DPM;  Location: [facility] ORS;  Service: Podiatry;  Laterality: Right;  Great toe  . CARDIAC CATHETERIZATION     [facility] no stent  . CORONARY ARTERY BYPASS GRAFT N/A 09/16/2013   Procedure: CORONARY ARTERY BYPASS GRAFTING (CABG);  Surgeon: [name], MD;  Location: [facility] OR;  Service: Open Heart Surgery;  Laterality: N/A;  CABG x four, using left internal mammary artery and right leg greater saphenous vein  .  INTRAOPERATIVE TRANSESOPHAGEAL ECHOCARDIOGRAM N/A 09/16/2013   Procedure: INTRAOPERATIVE TRANSESOPHAGEAL ECHOCARDIOGRAM;  Surgeon: [name], MD;  Location: [facility] OR;  Service: Open Heart Surgery;  Laterality: N/A;  . knee surgery Right   . LASIK Bilateral     FHx:  Family History  Problem Relation Age of Onset  . Heart disease Mother     Social History:  reports that he has been smoking cigarettes. He has a 45.00 pack-year smoking history. He has never used smokeless tobacco. He reports that he does not drink alcohol and does not use drugs.  Allergies: No Known Allergies  Review of Systems: General ROS: negative Respiratory ROS: no cough, shortness of breath, or wheezing Cardiovascular ROS: no chest pain or dyspnea on exertion Gastrointestinal ROS: no abdominal pain, change in bowel habits, or black or bloody stools Musculoskeletal ROS: positive for - joint pain and joint swelling Neurological ROS: positive for - numbness/tingling Dermatological ROS: positive for Left hallux ulceration with redness and swelling/purulence  Medications Prior to Admission  Medication Sig Dispense Refill  . amLODipine (NORVASC) 10 MG tablet Take 10 mg by mouth daily. for high blood pressure  2  . aspirin EC 81 MG tablet Take 81 mg by mouth daily. Swallow whole.    Marland Kitchen atorvastatin (LIPITOR) 80 MG tablet Take 1 tablet by mouth every evening.  2  . carvedilol (COREG) 25 MG tablet Take 1 tablet (25 mg total) by mouth 2 (two)  times daily. 60 tablet 6  . cloNIDine (CATAPRES) 0.3 MG tablet Take 1 tablet by mouth 2 (two) times daily.  5  . glipiZIDE (GLUCOTROL XL) 10 MG 24 hr tablet Take 1 tablet by mouth every morning.  2  . LANTUS SOLOSTAR 100 UNIT/ML Solostar Pen Inject 52 Units into the skin every evening. (Patient taking differently: Inject 64 Units into the skin every morning. ) 15 mL 2  . lisinopril (PRINIVIL,ZESTRIL) 40 MG tablet Take 40 mg by mouth daily. for high blood pressure  2  . spironolactone  (ALDACTONE) 50 MG tablet Take 100 mg by mouth daily.      Physical Exam: General: Alert and oriented.  No apparent distress.  Vascular: DP/PT pulses +1/faintly palpable bilateral, appearing diminished.  Mild to moderate erythema and edema present to the left hallux to the first metatarsal phalangeal joint, mildly improved overall.  No hair growth noted to digits both feet  Neuro: Light touch sensation to the lower extremities reduced bilateral.    Derm: Left hallux amputation site appears to be well coapted with sutures intact and skin edges well approximated and everted.  Appears to have mild to moderate erythema and edema present to this area consistent with postoperative changes.  No purulence able be expressed.  No odor.  Capillary fill time appears to be intact to dorsal and plantar flaps.  Erythema is reduced with elevation.  MSK: Previous right hallux amputation.  Minimal to no pain the left hallux amputation site.  Hammertoes both feet.  Results for orders placed or performed during the hospital encounter of 04/05/20 (from the past 48 hour(s))  Glucose, capillary     Status: Abnormal   Collection Time: 04/08/20  3:48 PM  Result Value Ref Range   Glucose-Capillary 143 (H) 70 - 99 mg/dL    Comment: Glucose reference range applies only to samples taken after fasting for at least 8 hours.  Aerobic/Anaerobic Culture (surgical/deep wound)     Status: None (Preliminary result)   Collection Time: 04/08/20  5:39 PM   Specimen: PATH Traumatic digit amputation; Tissue  Result Value Ref Range   Specimen Description      TOE GREAT LEFT Performed at [facility], [address] [place], [place] [address]    Special Requests      NONE Performed at [facility], [address] [place], [place] [address]    Gram Stain      NO WBC SEEN NO ORGANISMS SEEN Performed at [facility], [address], [place], [place] [address]    Culture RARE GRAM NEGATIVE RODS     Report Status PENDING   Glucose, capillary     Status: Abnormal   Collection Time: 04/08/20  6:01 PM  Result Value Ref Range   Glucose-Capillary 145 (H) 70 - 99 mg/dL    Comment: Glucose reference range applies only to samples taken after fasting for at least 8 hours.  Glucose, capillary     Status: Abnormal   Collection Time: 04/08/20  9:24 PM  Result Value Ref Range   Glucose-Capillary 137 (H) 70 - 99 mg/dL    Comment: Glucose reference range applies only to samples taken after fasting for at least 8 hours.  Glucose, capillary     Status: Abnormal   Collection Time: 04/08/20  9:25 PM  Result Value Ref Range   Glucose-Capillary 143 (H) 70 - 99 mg/dL    Comment: Glucose reference range applies only to samples taken after fasting  for at least 8 hours.  CBC     Status: Abnormal   Collection Time: 04/09/20  4:28 AM  Result Value Ref Range   WBC 10.3 4.0 - 10.5 K/uL   RBC 3.54 (L) 4.22 - 5.81 MIL/uL   Hemoglobin 11.6 (L) 13.0 - 17.0 g/dL   HCT 54.0 (L) 39 - 52 %   MCV 93.8 80.0 - 100.0 fL   MCH 32.8 26.0 - 34.0 pg   MCHC 34.9 30.0 - 36.0 g/dL   RDW 08.6 76.1 - 95.0 %   Platelets 284 150 - 400 K/uL   nRBC 0.0 0.0 - 0.2 %    Comment: Performed at [facility], [address] [place], [place] [address]  Basic metabolic panel     Status: Abnormal   Collection Time: 04/09/20  4:28 AM  Result Value Ref Range   Sodium 131 (L) 135 - 145 mmol/L   Potassium 4.6 3.5 - 5.1 mmol/L   Chloride 104 98 - 111 mmol/L   CO2 20 (L) 22 - 32 mmol/L   Glucose, Bld 204 (H) 70 - 99 mg/dL    Comment: Glucose reference range applies only to samples taken after fasting for at least 8 hours.   BUN 18 6 - 20 mg/dL   Creatinine, Ser 1.24 0.61 - 1.24 mg/dL   Calcium 9.4 8.9 - 58.0 mg/dL   GFR calc non Af Amer >60 >60 mL/min   GFR calc Af Amer >60 >60 mL/min   Anion gap 7 5 - 15    Comment: Performed at [facility], [address] [place], [place] [address]  Glucose, capillary      Status: Abnormal   Collection Time: 04/09/20  7:58 AM  Result Value Ref Range   Glucose-Capillary 200 (H) 70 - 99 mg/dL    Comment: Glucose reference range applies only to samples taken after fasting for at least 8 hours.  Vancomycin, trough     Status: Abnormal   Collection Time: 04/09/20  9:21 AM  Result Value Ref Range   Vancomycin Tr 10 (L) 15 - 20 ug/mL    Comment: Performed at [facility], [address], [place], [place] [address]  Glucose, capillary     Status: Abnormal   Collection Time: 04/09/20 11:42 AM  Result Value Ref Range   Glucose-Capillary 179 (H) 70 - 99 mg/dL    Comment: Glucose reference range applies only to samples taken after fasting for at least 8 hours.  Glucose, capillary     Status: Abnormal   Collection Time: 04/09/20  4:42 PM  Result Value Ref Range   Glucose-Capillary 276 (H) 70 - 99 mg/dL    Comment: Glucose reference range applies only to samples taken after fasting for at least 8 hours.  Glucose, capillary     Status: Abnormal   Collection Time: 04/09/20  8:52 PM  Result Value Ref Range   Glucose-Capillary 200 (H) 70 - 99 mg/dL    Comment: Glucose reference range applies only to samples taken after fasting for at least 8 hours.  CBC     Status: Abnormal   Collection Time: 04/10/20  6:29 AM  Result Value Ref Range   WBC 7.7 4.0 - 10.5 K/uL   RBC 3.19 (L) 4.22 - 5.81 MIL/uL   Hemoglobin 10.3 (L) 13.0 - 17.0 g/dL   HCT 39.7 (L) 39 - 52 %   MCV 95.3 80.0 - 100.0 fL   MCH 32.3 26.0 - 34.0 pg  MCHC 33.9 30.0 - 36.0 g/dL   RDW 68.1 27.5 - 17.0 %   Platelets 259 150 - 400 K/uL   nRBC 0.0 0.0 - 0.2 %    Comment: Performed at [facility], [address] [place], [place] [address]  Basic metabolic panel     Status: Abnormal   Collection Time: 04/10/20  6:29 AM  Result Value Ref Range   Sodium 129 (L) 135 - 145 mmol/L   Potassium 4.4 3.5 - 5.1 mmol/L   Chloride 101 98 - 111 mmol/L   CO2 21 (L) 22 - 32 mmol/L   Glucose, Bld 290  (H) 70 - 99 mg/dL    Comment: Glucose reference range applies only to samples taken after fasting for at least 8 hours.   BUN 20 6 - 20 mg/dL   Creatinine, Ser 4.49 0.61 - 1.24 mg/dL   Calcium 9.1 8.9 - 67.5 mg/dL   GFR calc non Af Amer >60 >60 mL/min   GFR calc Af Amer >60 >60 mL/min   Anion gap 7 5 - 15    Comment: Performed at [facility], [address] [place], [place] [address]  Glucose, capillary     Status: Abnormal   Collection Time: 04/10/20  7:46 AM  Result Value Ref Range   Glucose-Capillary 293 (H) 70 - 99 mg/dL    Comment: Glucose reference range applies only to samples taken after fasting for at least 8 hours.  Glucose, capillary     Status: Abnormal   Collection Time: 04/10/20 12:07 PM  Result Value Ref Range   Glucose-Capillary 221 (H) 70 - 99 mg/dL    Comment: Glucose reference range applies only to samples taken after fasting for at least 8 hours.   PERIPHERAL VASCULAR CATHETERIZATION  Result Date: 04/09/2020 See Op Note  DG Foot 2 Views Left  Result Date: 04/08/2020 CLINICAL DATA:  Postoperative evaluation. EXAM: LEFT FOOT - 2 VIEW COMPARISON:  April 05, 2020 FINDINGS: Since the prior study there is been surgical amputation of the proximal and distal phalanges of the left great toe. There is no evidence of acute fracture or dislocation. Chronic changes are seen involving the distal aspect of the fourth and fifth left metatarsals. There is a large plantar calcaneal spur. Mild soft tissue swelling is seen along the dorsal aspect of the mid left foot. IMPRESSION: Interval surgical amputation of the proximal and distal phalanges of the left great toe. Electronically Signed   By: Aram Candela M.D.   On: 04/08/2020 20:25    Blood pressure 109/68, pulse (!) 47, temperature 97.6 F (36.4 C), temperature source Oral, resp. rate 18, height 6\' 1"  (1.854 m), weight 111.1 kg, SpO2 100 %.  Assessment 1. Left hallux gangrenous necrosis associated  cellulitis 2. Left hallux osteomyelitis 3. Diabetes type 2 polyneuropathy 4. Peripheral vascular disease  Plan -Patient seen and examined today. -Postop x-rays reviewed. -Intraoperatively did not appear to have any signs of infection at the level of the amputation site. -Patient had a mild amount of bleeding during the case indicating that he does have some blood flow to the area but could run into wound healing problems potentially if the flow is inadequate to heal totally.  Patient had recent revascularization procedure which should potentially help with the situation. -Sutures appear to be intact with skin edges well coapted.  Erythema and edema is consistent with postoperative course and erythema goes down with elevation of the foot indicating component of PVD likely/hyperemia.  Capillary  fill time does appear to be intact though to the incision line. -Dressing change today.  Applied Betadine to the incision line followed by 4 x 4 gauze, Kerlix, Ace wrap.  Patient instructed to keep dressings clean, dry, and intact and to be partial weightbearing with heel contact only in a surgical shoe.  Patient needs to keep the foot elevated as well to reduce swelling further. -Culture from procedure grew gram-negative rods.  Likely to result in same growth as previous culture that was taken on 8/2 which grew Pseudomonas susceptible to ciprofloxacin with the best sensitivity.  Recommend that patient be discharged on ciprofloxacin 7-day course and Bactrim or doxycycline for another 7-day course as well in addition to that.  Defer to medicine for antibiotics at discharge.  WBC 7.7 today, afebrile vital signs stable. -Intraoperative pathologic specimen still pending but believe that margin will be clear for infection.  Podiatry team to sign off at this point.  If any issues arise please reconsult.  Discharge instructions placed in chart.  Rosetta Posner, DPM 04/10/2020, 12:46 PM

## 2020-04-10 NOTE — Plan of Care (Signed)
Patient discharged to home with self care.  Podiatry changed dressing and gave education for care.  Patient able to retell instructions.  Discharge instructions and medication changes reviewed with patient who verbalized understanding and packet given to patient.  Post op boot on, in use. PIV removed x2 with tip intact.

## 2020-04-10 NOTE — Progress Notes (Signed)
Physical Therapy Evaluation Patient Details Name: ARLES RUMBOLD MRN: [id number] DOB: 07-02-1961 Today's Date: 04/10/2020   History of Present Illness  Per MD note:Sayan L Kentner is a 59 y.o. 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  The fat layer was exposed.  Patient has failed to improve in the outpatient setting.  Seen in the ER today with worsening of his ulcer appeared infected failing outpatient therapy and is being admitted to the hospital for treatment.  He denied fever or chills.  Patient has significant neuropathy.  Mild pain only.  Patient being admitted to the hospital and will consult podiatry.  Clinical Impression  Patient agrees to PT evaluation. He is found walking around his room with his post-op shoe and FWB on heel. He reports that his foot is hurting. He is waiting for ride home and DC papers are on counter from nursing. He is instructed in gait training with RW , PWB L foot 100 feet. He has 5 steps at home with railing and lives alone.He is able to transfer sit to stand with MI using RW and PWB LLE.  His standing balance is WFL and strength is WFL to LLE. He will not need any skilled PT at home and is MI with ambulation with RW.    Follow Up Recommendations No PT follow up    Equipment Recommendations  Rolling walker with 5" wheels    Recommendations for Other Services       Precautions / Restrictions Precautions Precautions: Other (comment) Precaution Comments:  (PWB L foot) Restrictions Weight Bearing Restrictions: Yes LLE Weight Bearing: Partial weight bearing      Mobility  Bed  Mobility Overal bed mobility: Modified Independent                Transfers Overall transfer level: Modified independent Equipment used: Rolling walker (2 wheeled)             General transfer comment: VC for precautions  Ambulation/Gait Ambulation/Gait assistance: Modified independent (Device/Increase time) Gait Distance (Feet): 100 Feet Assistive device: Rolling walker (2 wheeled) Gait Pattern/deviations: WFL(Within Functional Limits)        Stairs            Wheelchair Mobility    Modified Rankin (Stroke Patients Only)       Balance Overall balance assessment: Independent                                           Pertinent Vitals/Pain Pain Assessment: Faces Faces Pain Scale: Hurts even more Pain Location:  (left foot) Pain Descriptors / Indicators: Aching Pain Intervention(s): Limited activity within patient's tolerance;Monitored during session    Home Living Family/patient expects to be discharged to:: Private residence Living Arrangements: Alone     Home Access: Stairs to enter Entrance Stairs-Rails: Right Entrance Stairs-Number of Steps: 5          Prior Function Level of Independence: Independent               Hand Dominance  Extremity/Trunk Assessment   Upper Extremity Assessment Upper Extremity Assessment: Overall WFL for tasks assessed    Lower Extremity Assessment Lower Extremity Assessment: Overall WFL for tasks assessed       Communication   Communication: No difficulties  Cognition Arousal/Alertness: Awake/alert Behavior During Therapy: WFL for tasks assessed/performed Overall Cognitive Status: Within Functional Limits for tasks assessed                                        General Comments      Exercises     Assessment/Plan    PT Assessment Patient needs continued PT services  PT Problem List Decreased mobility       PT Treatment Interventions DME  instruction;Gait training    PT Goals (Current goals can be found in the Care Plan section)  Acute Rehab PT Goals Patient Stated Goal: to walk PT Goal Formulation: All assessment and education complete, DC therapy    Frequency Min 2X/week   Barriers to discharge Decreased caregiver support      Co-evaluation               AM-PAC PT "6 Clicks" Mobility  Outcome Measure Help needed turning from your back to your side while in a flat bed without using bedrails?: None Help needed moving from lying on your back to sitting on the side of a flat bed without using bedrails?: None Help needed moving to and from a bed to a chair (including a wheelchair)?: None Help needed standing up from a chair using your arms (e.g., wheelchair or bedside chair)?: None Help needed to walk in hospital room?: None Help needed climbing 3-5 steps with a railing? : None 6 Click Score: 24    End of Session Equipment Utilized During Treatment: Gait belt Activity Tolerance: Patient tolerated treatment well Patient left: in bed Nurse Communication: Mobility status PT Visit Diagnosis: Unsteadiness on feet (R26.81);Muscle weakness (generalized) (M62.81);Difficulty in walking, not elsewhere classified (R26.2)    Time: 1440-1450 PT Time Calculation (min) (ACUTE ONLY): 10 min   Charges:   PT Evaluation $PT Eval Low Complexity: 1 Low            Satchel Heidinger, Sharion Settler, PT DPT 04/10/2020, 3:12 PM

## 2020-04-12 ENCOUNTER — Encounter: Payer: Self-pay | Admitting: Vascular Surgery

## 2020-04-13 LAB — SURGICAL PATHOLOGY

## 2020-04-14 LAB — AEROBIC/ANAEROBIC CULTURE W GRAM STAIN (SURGICAL/DEEP WOUND): Gram Stain: NONE SEEN

## 2020-06-08 ENCOUNTER — Other Ambulatory Visit
Admission: RE | Admit: 2020-06-08 | Discharge: 2020-06-08 | Disposition: A | Discharge status: Home / Self Care | Payer: Medicaid Other | Source: Ambulatory Visit | Attending: Podiatry | Admitting: Podiatry

## 2020-06-08 ENCOUNTER — Ambulatory Visit (INDEPENDENT_AMBULATORY_CARE_PROVIDER_SITE_OTHER): Payer: Medicaid Other | Admitting: Vascular Surgery

## 2020-06-08 ENCOUNTER — Other Ambulatory Visit (INDEPENDENT_AMBULATORY_CARE_PROVIDER_SITE_OTHER): Payer: Self-pay | Admitting: Vascular Surgery

## 2020-06-08 ENCOUNTER — Other Ambulatory Visit: Payer: Self-pay

## 2020-06-08 ENCOUNTER — Other Ambulatory Visit (INDEPENDENT_AMBULATORY_CARE_PROVIDER_SITE_OTHER): Payer: Medicaid Other

## 2020-06-08 ENCOUNTER — Encounter (INDEPENDENT_AMBULATORY_CARE_PROVIDER_SITE_OTHER): Payer: Self-pay | Admitting: Vascular Surgery

## 2020-06-08 VITALS — BP 151/71 | HR 71 | Ht 73.0 in | Wt 252.0 lb

## 2020-06-08 DIAGNOSIS — M79605 Pain in left leg: Secondary | ICD-10-CM | POA: Diagnosis not present

## 2020-06-08 DIAGNOSIS — R600 Localized edema: Secondary | ICD-10-CM

## 2020-06-08 DIAGNOSIS — I2510 Atherosclerotic heart disease of native coronary artery without angina pectoris: Secondary | ICD-10-CM

## 2020-06-08 DIAGNOSIS — I10 Essential (primary) hypertension: Secondary | ICD-10-CM | POA: Diagnosis not present

## 2020-06-08 DIAGNOSIS — E1142 Type 2 diabetes mellitus with diabetic polyneuropathy: Secondary | ICD-10-CM | POA: Diagnosis present

## 2020-06-08 DIAGNOSIS — E785 Hyperlipidemia, unspecified: Secondary | ICD-10-CM

## 2020-06-08 DIAGNOSIS — E11621 Type 2 diabetes mellitus with foot ulcer: Secondary | ICD-10-CM

## 2020-06-08 DIAGNOSIS — Z89422 Acquired absence of other left toe(s): Secondary | ICD-10-CM | POA: Diagnosis present

## 2020-06-08 DIAGNOSIS — I7025 Atherosclerosis of native arteries of other extremities with ulceration: Secondary | ICD-10-CM

## 2020-06-08 DIAGNOSIS — L97522 Non-pressure chronic ulcer of other part of left foot with fat layer exposed: Secondary | ICD-10-CM | POA: Diagnosis present

## 2020-06-08 DIAGNOSIS — M7989 Other specified soft tissue disorders: Secondary | ICD-10-CM

## 2020-06-08 DIAGNOSIS — L97509 Non-pressure chronic ulcer of other part of unspecified foot with unspecified severity: Secondary | ICD-10-CM

## 2020-06-08 DIAGNOSIS — Z794 Long term (current) use of insulin: Secondary | ICD-10-CM

## 2020-06-08 NOTE — Assessment & Plan Note (Signed)
Continue cardiac and antihypertensive medications as already ordered and reviewed, no changes at this time. Continue statin as ordered and reviewed, no changes at this time Nitrates PRN for chest pain  

## 2020-06-08 NOTE — Assessment & Plan Note (Signed)
blood glucose control important in reducing the progression of atherosclerotic disease. Also, involved in wound healing. On appropriate medications.  

## 2020-06-08 NOTE — Assessment & Plan Note (Signed)
blood pressure control important in reducing the progression of atherosclerotic disease. On appropriate oral medications.  

## 2020-06-08 NOTE — Patient Instructions (Signed)
Peripheral Vascular Disease  Peripheral vascular disease (PVD) is a disease of the blood vessels that are not part of your heart and brain. A simple term for PVD is poor circulation. In most cases, PVD narrows the blood vessels that carry blood from your heart to the rest of your body. This can reduce the supply of blood to your arms, legs, and internal organs, like your stomach or kidneys. However, PVD most often affects a person's lower legs and feet. Without treatment, PVD tends to get worse. PVD can also lead to acute ischemic limb. This is when an arm or leg suddenly cannot get enough blood. This is a medical emergency. Follow these instructions at home: Lifestyle  Do not use any products that contain nicotine or tobacco, such as cigarettes and e-cigarettes. If you need help quitting, ask your doctor.  Lose weight if you are overweight. Or, stay at a healthy weight as told by your doctor.  Eat a diet that is low in fat and cholesterol. If you need help, ask your doctor.  Exercise regularly. Ask your doctor for activities that are right for you. General instructions  Take over-the-counter and prescription medicines only as told by your doctor.  Take good care of your feet: ? Wear comfortable shoes that fit well. ? Check your feet often for any cuts or sores.  Keep all follow-up visits as told by your doctor This is important. Contact a doctor if:  You have cramps in your legs when you walk.  You have leg pain when you are at rest.  You have coldness in a leg or foot.  Your skin changes.  You are unable to get or have an erection (erectile dysfunction).  You have cuts or sores on your feet that do not heal. Get help right away if:  Your arm or leg turns cold, numb, and blue.  Your arms or legs become red, warm, swollen, painful, or numb.  You have chest pain.  You have trouble breathing.  You suddenly have weakness in your face, arm, or leg.  You become very  confused or you cannot speak.  You suddenly have a very bad headache.  You suddenly cannot see. Summary  Peripheral vascular disease (PVD) is a disease of the blood vessels.  A simple term for PVD is poor circulation. Without treatment, PVD tends to get worse.  Treatment may include exercise, low fat and low cholesterol diet, and quitting smoking. This information is not intended to replace advice given to you by your health care provider. Make sure you discuss any questions you have with your health care provider. Document Revised: 08/03/2017 Document Reviewed: 09/28/2016 Elsevier Patient Education  2020 [employer]  

## 2020-06-08 NOTE — Assessment & Plan Note (Signed)
Noninvasive studies today show noncompressible vessels on the right with biphasic waveforms.  On the left his ABIs 1.07 with biphasic waveforms consistent with good arterial flow after revascularization.  His perfusion is currently intact and adequate for wound healing.  Until the wound is completely healed, we will continue to follow him on close intervals and I will plan to see him back in 3 months.  Continue current medical regimen.

## 2020-06-08 NOTE — Progress Notes (Signed)
Patient ID: Marc [name], male   DOB: 06/14/1961, 59 y.o.   MRN: [id number]  Chief Complaint  Patient presents with  . Follow-up    No baker. Ulcer    HPI Marc [name] is a 59 y.o. male.  I am asked to see the patient by Dr. Excell Seltzer for evaluation of his left leg perfusion after an intervention a couple of months ago for nonhealing ulceration on the left foot.  He ultimately underwent a left great toe amputation with subsequent wound.  This was treated again with a synthetic skin graft yesterday.  He is not having a lot of pain.  He has mild swelling.  No fevers or chills or signs of systemic infection.  He had not had his postoperative perfusion check as he missed his last appointment.  He had no periprocedural complications and his incision from the access site healed well.  Noninvasive studies today show noncompressible vessels on the right with biphasic waveforms.  On the left his ABIs 1.07 with biphasic waveforms consistent with good arterial flow after revascularization.     Past Medical History:  Diagnosis Date  . Anxiety   . Cardiomyopathy (HCC)    45-50%  . CHF (congestive heart failure) (HCC)   . Chronic systolic heart failure (HCC) 07/2013   Ejection fraction of 45-50% with possible inferior wall hypokinesis  . Coronary artery disease    Significant left main and Severe three-vessel coronary artery disease in December of 2014. He underwent CABG in January of 2015 with LIMA to LAD, sequential SVG to ramus/OM 2 and SVG to OM 3  . Diabetes mellitus without complication (HCC)   . Dysrhythmia   . Hyperlipidemia   . Hypertension   . Medical non-compliance   . MI (myocardial infarction) (HCC)   . Obesity   . Tobacco abuse     Past Surgical History:  Procedure Laterality Date  . AMPUTATION Left 04/08/2020   Procedure: AMPUTATION RAY;  Surgeon: Rosetta Posner, DPM;  Location: [facility] ORS;  Service: Podiatry;  Laterality: Left;  . AMPUTATION TOE Right 02/29/2016   Procedure:  AMPUTATION TOE;  Surgeon: Linus Galas, DPM;  Location: [facility] ORS;  Service: Podiatry;  Laterality: Right;  Great toe  . CARDIAC CATHETERIZATION     [facility] no stent  . CORONARY ARTERY BYPASS GRAFT N/A 09/16/2013   Procedure: CORONARY ARTERY BYPASS GRAFTING (CABG);  Surgeon: [name], MD;  Location: [facility] OR;  Service: Open Heart Surgery;  Laterality: N/A;  CABG x four, using left internal mammary artery and right leg greater saphenous vein  . INTRAOPERATIVE TRANSESOPHAGEAL ECHOCARDIOGRAM N/A 09/16/2013   Procedure: INTRAOPERATIVE TRANSESOPHAGEAL ECHOCARDIOGRAM;  Surgeon: [name], MD;  Location: [facility] OR;  Service: Open Heart Surgery;  Laterality: N/A;  . knee surgery Right   . LASIK Bilateral   . LOWER EXTREMITY ANGIOGRAPHY Left 04/07/2020   Procedure: Lower Extremity Angiography;  Surgeon: Renford Dills, MD;  Location: [facility] INVASIVE CV LAB;  Service: Cardiovascular;  Laterality: Left;  . LOWER EXTREMITY ANGIOGRAPHY Left 04/09/2020   Procedure: Lower Extremity Angiography (Pedal Approach);  Surgeon: Renford Dills, MD;  Location: [facility] INVASIVE CV LAB;  Service: Cardiovascular;  Laterality: Left;     Family History  Problem Relation Age of Onset  . Heart disease Mother   No bleeding or clotting disorders No aneurysms   Social History   Tobacco Use  . Smoking status: Current Every Day Smoker    Packs/day: 1.00  Years: 45.00    Pack years: 45.00    Types: Cigarettes  . Smokeless tobacco: Never Used  Substance Use Topics  . Alcohol use: No    Alcohol/week: 0.0 standard drinks    Comment: occassional  . Drug use: No    No Known Allergies  Current Outpatient Medications  Medication Sig Dispense Refill  . amLODipine (NORVASC) 10 MG tablet Take 10 mg by mouth daily. for high blood pressure  2  . aspirin EC 81 MG tablet Take 81 mg by mouth daily. Swallow whole.    Marland Kitchen atorvastatin (LIPITOR) 80 MG tablet Take 1 tablet by mouth every evening.  2  . carvedilol (COREG) 25  MG tablet Take 0.5 tablets (12.5 mg total) by mouth 2 (two) times daily. 60 tablet 6  . cloNIDine (CATAPRES) 0.3 MG tablet Take 1 tablet by mouth 2 (two) times daily.  5  . glipiZIDE (GLUCOTROL XL) 10 MG 24 hr tablet Take 1 tablet by mouth every morning.  2  . LANTUS SOLOSTAR 100 UNIT/ML Solostar Pen Inject 52 Units into the skin every evening. (Patient taking differently: Inject 64 Units into the skin every morning. ) 15 mL 2  . spironolactone (ALDACTONE) 50 MG tablet Take 100 mg by mouth daily.     No current facility-administered medications for this visit.      REVIEW OF SYSTEMS (Negative unless checked)  Constitutional: [] Weight loss  [] Fever  [] Chills Cardiac: [] Chest pain   [] Chest pressure   [] Palpitations   [] Shortness of breath when laying flat   [] Shortness of breath at rest   [] Shortness of breath with exertion. Vascular:  [x] Pain in legs with walking   [] Pain in legs at rest   [] Pain in legs when laying flat   [] Claudication   [] Pain in feet when walking  [] Pain in feet at rest  [] Pain in feet when laying flat   [] History of DVT   [] Phlebitis   [] Swelling in legs   [] Varicose veins   [x] Non-healing ulcers Pulmonary:   [] Uses home oxygen   [] Productive cough   [] Hemoptysis   [] Wheeze  [] COPD   [] Asthma Neurologic:  [] Dizziness  [] Blackouts   [] Seizures   [] History of stroke   [] History of TIA  [] Aphasia   [] Temporary blindness   [] Dysphagia   [] Weakness or numbness in arms   [] Weakness or numbness in legs Musculoskeletal:  [x] Arthritis   [] Joint swelling   [] Joint pain   [] Low back pain Hematologic:  [] Easy bruising  [] Easy bleeding   [] Hypercoagulable state   [] Anemic  [] Hepatitis Gastrointestinal:  [] Blood in stool   [] Vomiting blood  [] Gastroesophageal reflux/heartburn   [] Abdominal pain Genitourinary:  [] Chronic kidney disease   [] Difficult urination  [] Frequent urination  [] Burning with urination   [] Hematuria Skin:  [] Rashes   [x] Ulcers   [x] Wounds Psychological:  [x] History  of anxiety   []  History of major depression.    Physical Exam BP (!) 151/71   Pulse 71   Ht 6\' 1"  (1.854 m)   Wt 252 lb (114.3 kg)   BMI 33.25 kg/m  Gen:  WD/WN, NAD. Appears older than stated age. Head: [place]/AT, No temporalis wasting. Ear/Nose/Throat: Hearing grossly intact, nares w/o erythema or drainage, oropharynx w/o Erythema/Exudate Eyes: Conjunctiva clear, sclera non-icteric  Neck: trachea midline.  No JVD.  Pulmonary:  Good air movement, respirations not labored, no use of accessory muscles  Cardiac: Somewhat irregular Vascular:  Vessel Right Left  Radial Palpable Palpable  DP  1+  unable to palpate as the foot is dressed  PT  1+  trace   Gastrointestinal:. No masses, surgical incisions, or scars. Musculoskeletal: M/S 5/5 throughout.  Extremities without ischemic changes.  No deformity or atrophy.  1+ bilateral lower extremity edema.  Left foot dressing is currently in place Neurologic: Sensation grossly intact in extremities.  Symmetrical.  Speech is fluent. Motor exam as listed above. Psychiatric: Judgment intact, Mood & affect appropriate for pt's clinical situation. Dermatologic: Currently dressed but there is a recent wound over the skin graft of the left great toe amputation site    Radiology ABI WITH/WO TBI  Result Date: 06/08/2020 LOWER EXTREMITY DOPPLER STUDY  Performing Technologist: Reece Agar RT (R)(VS)  Examination Guidelines: A complete evaluation includes at minimum, Doppler waveform signals and systolic blood pressure reading at the level of bilateral brachial, anterior tibial, and posterior tibial arteries, when vessel segments are accessible. Bilateral testing is considered an integral part of a complete examination. Photoelectric Plethysmograph (PPG) waveforms and toe systolic pressure readings are included as required and additional duplex testing as needed. Limited examinations for reoccurring indications may be  performed as noted.  ABI Findings: +---------+------------------+-----+----------+----------------+ Right    Rt Pressure (mmHg)IndexWaveform  Comment          +---------+------------------+-----+----------+----------------+ Brachial 145                                               +---------+------------------+-----+----------+----------------+ ATA      239               1.61 monophasic                 +---------+------------------+-----+----------+----------------+ PTA                             biphasic  Non compressable +---------+------------------+-----+----------+----------------+ Great Toe                                 Amputated        +---------+------------------+-----+----------+----------------+ +---------+------------------+-----+--------+---------+ Left     Lt Pressure (mmHg)IndexWaveformComment   +---------+------------------+-----+--------+---------+ Brachial 148                                      +---------+------------------+-----+--------+---------+ ATA      158               1.07 biphasic          +---------+------------------+-----+--------+---------+ PTA      159               1.07 biphasic          +---------+------------------+-----+--------+---------+ Great Toe                               Amputated +---------+------------------+-----+--------+---------+  Summary: Right: Resting right ankle-brachial index indicates noncompressible right lower extremity arteries. ABIs are unreliable. Left: Resting left ankle-brachial index is within normal range. No evidence of significant left lower extremity arterial disease.  *See table(s) above for measurements and observations.  Electronically signed by Festus Barren MD on 06/08/2020 at 11:40:26 AM.   Final  Labs Recent Results (from the past 2160 hour(s))  Aerobic/Anaerobic Culture (surgical/deep wound)     Status: None   Collection Time: 04/05/20  4:54 PM   Specimen: Ulcer  Result  Value Ref Range   Specimen Description      ULCER Performed at [facility], [address], [place], [place] [address]    Special Requests      NONE Performed at [facility], [address] [place], [place] [address]    Gram Stain      FEW WBC PRESENT, PREDOMINANTLY PMN MODERATE GRAM POSITIVE COCCI FEW GRAM NEGATIVE RODS Performed at [facility], [address] [place], [place] [address]    Culture      ABUNDANT PSEUDOMONAS AERUGINOSA MIXED ANAEROBIC FLORA PRESENT.  CALL LAB IF FURTHER IID REQUIRED.    Report Status 04/08/2020 FINAL    Organism ID, Bacteria PSEUDOMONAS AERUGINOSA       Susceptibility   Pseudomonas aeruginosa - MIC*    CEFTAZIDIME 4 SENSITIVE Sensitive     CIPROFLOXACIN <=0.25 SENSITIVE Sensitive     GENTAMICIN 2 SENSITIVE Sensitive     IMIPENEM 2 SENSITIVE Sensitive     PIP/TAZO <=4 SENSITIVE Sensitive     CEFEPIME 2 SENSITIVE Sensitive     * ABUNDANT PSEUDOMONAS AERUGINOSA  Basic metabolic panel     Status: Abnormal   Collection Time: 04/05/20  5:41 PM  Result Value Ref Range   Sodium 133 (L) 135 - 145 mmol/L   Potassium 4.9 3.5 - 5.1 mmol/L   Chloride 103 98 - 111 mmol/L   CO2 21 (L) 22 - 32 mmol/L   Glucose, Bld 73 70 - 99 mg/dL    Comment: Glucose reference range applies only to samples taken after fasting for at least 8 hours.   BUN 34 (H) 6 - 20 mg/dL   Creatinine, Ser 2.13 (H) 0.61 - 1.24 mg/dL   Calcium 08.6 (H) 8.9 - 10.3 mg/dL   GFR calc non Af Amer 54 (L) >60 mL/min   GFR calc Af Amer >60 >60 mL/min   Anion gap 9 5 - 15    Comment: Performed at [facility], [address] [place], [place] [address]  CBC     Status: Abnormal   Collection Time: 04/05/20  5:41 PM  Result Value Ref Range   WBC 17.3 (H) 4.0 - 10.5 K/uL   RBC 4.11 (L) 4.22 - 5.81 MIL/uL   Hemoglobin 13.6 13.0 - 17.0 g/dL   HCT 96.2 (L) 39 - 52 %   MCV 92.2 80.0 - 100.0 fL   MCH 33.1 26.0 - 34.0 pg   MCHC 35.9 30.0 - 36.0 g/dL    RDW 95.2 84.1 - 32.4 %   Platelets 338 150 - 400 K/uL   nRBC 0.0 0.0 - 0.2 %    Comment: Performed at [facility], [address] [place], [place] [address]  Blood culture (routine x 2)     Status: None   Collection Time: 04/05/20  6:57 PM   Specimen: BLOOD  Result Value Ref Range   Specimen Description BLOOD BLOOD RIGHT HAND    Special Requests      BOTTLES DRAWN AEROBIC AND ANAEROBIC Blood Culture adequate volume   Culture      NO GROWTH 5 DAYS Performed at [facility], [address], [place], [place] [address]    Report Status 04/10/2020 FINAL   Blood culture (routine x 2)  Status: None   Collection Time: 04/05/20  6:57 PM   Specimen: BLOOD  Result Value Ref Range   Specimen Description BLOOD RIGHT ANTECUBITAL    Special Requests      BOTTLES DRAWN AEROBIC AND ANAEROBIC Blood Culture adequate volume   Culture      NO GROWTH 5 DAYS Performed at [facility], [address], [place], [place] [address]    Report Status 04/10/2020 FINAL   SARS Coronavirus 2 by RT PCR (hospital order, performed in [facility] hospital lab) Nasopharyngeal Nasopharyngeal Swab     Status: None   Collection Time: 04/05/20  6:57 PM   Specimen: Nasopharyngeal Swab  Result Value Ref Range   SARS Coronavirus 2 NEGATIVE NEGATIVE    Comment: (NOTE) SARS-CoV-2 target nucleic acids are NOT DETECTED.  The SARS-CoV-2 RNA is generally detectable in upper and lower respiratory specimens during the acute phase of infection. The lowest concentration of SARS-CoV-2 viral copies this assay can detect is 250 copies / mL. A negative result does not preclude SARS-CoV-2 infection and should not be used as the sole basis for treatment or other patient management decisions.  A negative result may occur with improper specimen collection / handling, submission of specimen other than nasopharyngeal swab, presence of viral mutation(s) within the areas targeted by this assay, and  inadequate number of viral copies (<250 copies / mL). A negative result must be combined with clinical observations, patient history, and epidemiological information.  Fact Sheet for Patients:   BoilerBrush.com.cy  Fact Sheet for Healthcare Providers: https://pope.com/  This test is not yet approved or  cleared by the [place] FDA and has been authorized for detection and/or diagnosis of SARS-CoV-2 by FDA under an Emergency Use Authorization (EUA).  This EUA will remain in effect (meaning this test can be used) for the duration of the COVID-19 declaration under Section 564(b)(1) of the Act, 21 U.S.C. section 360bbb-3(b)(1), unless the authorization is terminated or revoked sooner.  Performed at [facility], [address] [place], [place] [address]   Glucose, capillary     Status: None   Collection Time: 04/06/20 12:37 AM  Result Value Ref Range   Glucose-Capillary 84 70 - 99 mg/dL    Comment: Glucose reference range applies only to samples taken after fasting for at least 8 hours.  Glucose, capillary     Status: None   Collection Time: 04/06/20 12:37 AM  Result Value Ref Range   Glucose-Capillary 84 70 - 99 mg/dL    Comment: Glucose reference range applies only to samples taken after fasting for at least 8 hours.  HIV Antibody (routine testing w rflx)     Status: None   Collection Time: 04/06/20  4:23 AM  Result Value Ref Range   HIV Screen 4th Generation wRfx Non Reactive Non Reactive    Comment: Performed at [facility], [address], [place], [place] [address]  Comprehensive metabolic panel     Status: Abnormal   Collection Time: 04/06/20  4:23 AM  Result Value Ref Range   Sodium 133 (L) 135 - 145 mmol/L   Potassium 4.4 3.5 - 5.1 mmol/L   Chloride 104 98 - 111 mmol/L   CO2 21 (L) 22 - 32 mmol/L   Glucose, Bld 58 (L) 70 - 99 mg/dL    Comment: Glucose reference range applies only to samples taken  after fasting for at least 8 hours.   BUN 33 (H) 6 - 20 mg/dL  Creatinine, Ser 1.29 (H) 0.61 - 1.24 mg/dL   Calcium 9.7 8.9 - 32.4 mg/dL   Total Protein 6.6 6.5 - 8.1 g/dL   Albumin 3.5 3.5 - 5.0 g/dL   AST 12 (L) 15 - 41 U/L   ALT 16 0 - 44 U/L   Alkaline Phosphatase 48 38 - 126 U/L   Total Bilirubin 0.9 0.3 - 1.2 mg/dL   GFR calc non Af Amer 60 (L) >60 mL/min   GFR calc Af Amer >60 >60 mL/min   Anion gap 8 5 - 15    Comment: Performed at [facility], [address] [place], [place] [address]  CBC     Status: Abnormal   Collection Time: 04/06/20  4:23 AM  Result Value Ref Range   WBC 10.0 4.0 - 10.5 K/uL   RBC 3.43 (L) 4.22 - 5.81 MIL/uL   Hemoglobin 11.0 (L) 13.0 - 17.0 g/dL   HCT 72.5 (L) 39 - 52 %   MCV 95.9 80.0 - 100.0 fL   MCH 32.1 26.0 - 34.0 pg   MCHC 33.4 30.0 - 36.0 g/dL   RDW 36.6 44.0 - 34.7 %   Platelets 296 150 - 400 K/uL   nRBC 0.0 0.0 - 0.2 %    Comment: Performed at [facility], [address] [place], [place] [address]  Hemoglobin A1c     Status: Abnormal   Collection Time: 04/06/20  4:23 AM  Result Value Ref Range   Hgb A1c MFr Bld 7.3 (H) 4.8 - 5.6 %    Comment: (NOTE) Pre diabetes:          5.7%-6.4%  Diabetes:              >6.4%  Glycemic control for   <7.0% adults with diabetes    Mean Plasma Glucose 162.81 mg/dL    Comment: Performed at [facility], [address], [place], [place] [address]  Glucose, capillary     Status: Abnormal   Collection Time: 04/06/20  7:56 AM  Result Value Ref Range   Glucose-Capillary 66 (L) 70 - 99 mg/dL    Comment: Glucose reference range applies only to samples taken after fasting for at least 8 hours.  Glucose, capillary     Status: Abnormal   Collection Time: 04/06/20  7:56 AM  Result Value Ref Range   Glucose-Capillary 66 (L) 70 - 99 mg/dL    Comment: Glucose reference range applies only to samples taken after fasting for at least 8 hours.  Glucose, capillary     Status:  Abnormal   Collection Time: 04/06/20  8:32 AM  Result Value Ref Range   Glucose-Capillary 109 (H) 70 - 99 mg/dL    Comment: Glucose reference range applies only to samples taken after fasting for at least 8 hours.  Glucose, capillary     Status: Abnormal   Collection Time: 04/06/20  8:32 AM  Result Value Ref Range   Glucose-Capillary 109 (H) 70 - 99 mg/dL    Comment: Glucose reference range applies only to samples taken after fasting for at least 8 hours.  Glucose, capillary     Status: Abnormal   Collection Time: 04/06/20 11:50 AM  Result Value Ref Range   Glucose-Capillary 134 (H) 70 - 99 mg/dL    Comment: Glucose reference range applies only to samples taken after fasting for at least 8 hours.  Glucose, capillary     Status: Abnormal   Collection Time: 04/06/20  4:47  PM  Result Value Ref Range   Glucose-Capillary 107 (H) 70 - 99 mg/dL    Comment: Glucose reference range applies only to samples taken after fasting for at least 8 hours.  Glucose, capillary     Status: Abnormal   Collection Time: 04/06/20  9:06 PM  Result Value Ref Range   Glucose-Capillary 201 (H) 70 - 99 mg/dL    Comment: Glucose reference range applies only to samples taken after fasting for at least 8 hours.  CBC     Status: Abnormal   Collection Time: 04/07/20  4:35 AM  Result Value Ref Range   WBC 9.4 4.0 - 10.5 K/uL   RBC 3.54 (L) 4.22 - 5.81 MIL/uL   Hemoglobin 11.6 (L) 13.0 - 17.0 g/dL   HCT 16.1 (L) 39 - 52 %   MCV 96.3 80.0 - 100.0 fL   MCH 32.8 26.0 - 34.0 pg   MCHC 34.0 30.0 - 36.0 g/dL   RDW 09.6 04.5 - 40.9 %   Platelets 299 150 - 400 K/uL   nRBC 0.0 0.0 - 0.2 %    Comment: Performed at [facility], [address] [place], [place] [address]  Basic metabolic panel     Status: Abnormal   Collection Time: 04/07/20  6:52 AM  Result Value Ref Range   Sodium 132 (L) 135 - 145 mmol/L   Potassium 4.8 3.5 - 5.1 mmol/L   Chloride 104 98 - 111 mmol/L   CO2 20 (L) 22 - 32 mmol/L   Glucose,  Bld 177 (H) 70 - 99 mg/dL    Comment: Glucose reference range applies only to samples taken after fasting for at least 8 hours.   BUN 29 (H) 6 - 20 mg/dL   Creatinine, Ser 4.78 0.61 - 1.24 mg/dL   Calcium 9.3 8.9 - 29.5 mg/dL   GFR calc non Af Amer >60 >60 mL/min   GFR calc Af Amer >60 >60 mL/min   Anion gap 8 5 - 15    Comment: Performed at [facility], [address] [place], [place] [address]  Magnesium     Status: None   Collection Time: 04/07/20  6:52 AM  Result Value Ref Range   Magnesium 1.8 1.7 - 2.4 mg/dL    Comment: Performed at [facility], [address] [place], [place] [address]  Glucose, capillary     Status: Abnormal   Collection Time: 04/07/20  7:53 AM  Result Value Ref Range   Glucose-Capillary 164 (H) 70 - 99 mg/dL    Comment: Glucose reference range applies only to samples taken after fasting for at least 8 hours.   Comment 1 Notify RN   Glucose, capillary     Status: Abnormal   Collection Time: 04/07/20 10:50 AM  Result Value Ref Range   Glucose-Capillary 129 (H) 70 - 99 mg/dL    Comment: Glucose reference range applies only to samples taken after fasting for at least 8 hours.  POCT Activated clotting time     Status: None   Collection Time: 04/07/20  1:06 PM  Result Value Ref Range   Activated Clotting Time 158 seconds  Glucose, capillary     Status: Abnormal   Collection Time: 04/07/20  4:34 PM  Result Value Ref Range   Glucose-Capillary 119 (H) 70 - 99 mg/dL    Comment: Glucose reference range applies only to samples taken after fasting for at least 8 hours.  Heparin level (unfractionated)  Status: Abnormal   Collection Time: 04/07/20  9:49 PM  Result Value Ref Range   Heparin Unfractionated <0.10 (L) 0.30 - 0.70 IU/mL    Comment: (NOTE) If heparin results are below expected values, and patient dosage has  been confirmed, suggest follow up testing of antithrombin III levels. Performed at [facility], [address] [place], [place] [address]   Glucose, capillary     Status: Abnormal   Collection Time: 04/07/20 10:05 PM  Result Value Ref Range   Glucose-Capillary 240 (H) 70 - 99 mg/dL    Comment: Glucose reference range applies only to samples taken after fasting for at least 8 hours.  CBC     Status: Abnormal   Collection Time: 04/08/20  6:56 AM  Result Value Ref Range   WBC 8.9 4.0 - 10.5 K/uL   RBC 3.42 (L) 4.22 - 5.81 MIL/uL   Hemoglobin 11.0 (L) 13.0 - 17.0 g/dL   HCT 60.4 (L) 39 - 52 %   MCV 96.2 80.0 - 100.0 fL   MCH 32.2 26.0 - 34.0 pg   MCHC 33.4 30.0 - 36.0 g/dL   RDW 54.0 98.1 - 19.1 %   Platelets 278 150 - 400 K/uL   nRBC 0.0 0.0 - 0.2 %    Comment: Performed at [facility], [address] [place], [place] [address]  Basic metabolic panel     Status: Abnormal   Collection Time: 04/08/20  6:56 AM  Result Value Ref Range   Sodium 129 (L) 135 - 145 mmol/L   Potassium 4.6 3.5 - 5.1 mmol/L   Chloride 102 98 - 111 mmol/L   CO2 19 (L) 22 - 32 mmol/L   Glucose, Bld 196 (H) 70 - 99 mg/dL    Comment: Glucose reference range applies only to samples taken after fasting for at least 8 hours.   BUN 23 (H) 6 - 20 mg/dL   Creatinine, Ser 5.62 0.61 - 1.24 mg/dL   Calcium 9.5 8.9 - 13.0 mg/dL   GFR calc non Af Amer >60 >60 mL/min   GFR calc Af Amer >60 >60 mL/min   Anion gap 8 5 - 15    Comment: Performed at [facility], [address] [place], [place] [address]  Heparin level (unfractionated)     Status: Abnormal   Collection Time: 04/08/20  6:56 AM  Result Value Ref Range   Heparin Unfractionated 0.16 (L) 0.30 - 0.70 IU/mL    Comment: (NOTE) If heparin results are below expected values, and patient dosage has  been confirmed, suggest follow up testing of antithrombin III levels. Performed at [facility], [address] [place], [place] [address]   Glucose, capillary     Status: Abnormal   Collection Time: 04/08/20  7:22 AM  Result Value Ref  Range   Glucose-Capillary 171 (H) 70 - 99 mg/dL    Comment: Glucose reference range applies only to samples taken after fasting for at least 8 hours.  Glucose, capillary     Status: Abnormal   Collection Time: 04/08/20 11:29 AM  Result Value Ref Range   Glucose-Capillary 255 (H) 70 - 99 mg/dL    Comment: Glucose reference range applies only to samples taken after fasting for at least 8 hours.  Glucose, capillary     Status: Abnormal   Collection Time: 04/08/20  3:48 PM  Result Value Ref Range   Glucose-Capillary 143 (H) 70 - 99 mg/dL    Comment: Glucose reference  range applies only to samples taken after fasting for at least 8 hours.  Surgical pathology     Status: None   Collection Time: 04/08/20  5:36 PM  Result Value Ref Range   SURGICAL PATHOLOGY      SURGICAL PATHOLOGY CASE: [phone] PATIENT: Carney Harder Surgical Pathology Report     Specimen Submitted: A. Great toe, left  Clinical History: Osteomyelitis    DIAGNOSIS: A. TOE, LEFT GREAT; AMPUTATION: - ULCERATION, GANGRENOUS NECROSIS, AND ACUTE SUPPURATIVE INFLAMMATION. - ACUTE OSTEOMYELITIS. - VIABLE SKIN, SOFT TISSUE, AND BONY MARGINS WITHOUT ACUTE INFLAMMATION.  GROSS DESCRIPTION: A. Labeled: Left great toe, hallux proximal margin in purple ink Received: Formalin Size: 7.4 x 3.7 x 3.1 cm Description of lesion(s): At the presumed medial aspect of the toe there is a 4.1 x 3.6 cm area of gray-brown firm ulceration with surrounding sloughing skin.  The ulceration is focally 0.6 cm to the skin and soft tissue resection margin. Proximal margin: The proximal margin is received inked purple and is over inked blue.  The margin is grossly viable with tan-white smooth skin and tan soft tissue. Bone: There is an exposed 2.5 x 2.2 x 2.1 cm portion of bo ne at the resection margin.  The bone is disarticulated and firm.  The bone underlying the area of ulceration is tan-pink and firm. Other findings: None grossly  appreciated.  Block summary: 1 - 2 - skin and soft tissue resection margin, perpendicularly sectioned to area of ulceration 3 - 4 - disarticulated bone at resection margin, perpendicularly sectioned, following decalcification 5 - representative area of ulceration 6 - representative bone underlying area of ulceration, following decalcification  Tissue decalcification: Yes, cassettes 1, 4, and 6    Final Diagnosis performed by Katherine Mantle, MD.   Electronically signed 04/13/2020 11:49:31AM The electronic signature indicates that the named Attending Pathologist has evaluated the specimen Technical component performed at [facility], [address], [place], [place] [address] Lab: [phone] Dir: Jolene Schimke, MD, MMM  Professional component performed at [facility], [facility], [address] [place], [place], [place] [address] Lab: [phone] Dir: Georgiann Cocker. Rubinas, MD   Aerobic/Anaerobic Culture (surgical/deep wound)     Status: None   Collection Time: 04/08/20  5:39 PM   Specimen: PATH Traumatic digit amputation; Tissue  Result Value Ref Range   Specimen Description      TOE GREAT LEFT Performed at [facility], [address] [place], [place] [address]    Special Requests      NONE Performed at [facility], [address] [place], [place] [address]    Gram Stain      NO WBC SEEN NO ORGANISMS SEEN Performed at [facility], [address] [place], [place] [address]    Culture      RARE ENTEROBACTER CLOACAE FEW PEPTOSTREPTOCOCCUS SPECIES    Report Status 04/14/2020 FINAL    Organism ID, Bacteria ENTEROBACTER CLOACAE       Susceptibility   Enterobacter cloacae - MIC*    CEFAZOLIN >=64 RESISTANT Resistant     CEFEPIME <=0.12 SENSITIVE Sensitive     CEFTAZIDIME <=1 SENSITIVE Sensitive     CIPROFLOXACIN <=0.25 SENSITIVE Sensitive     GENTAMICIN <=1 SENSITIVE Sensitive     IMIPENEM <=0.25 SENSITIVE Sensitive     TRIMETH/SULFA <=20  SENSITIVE Sensitive     PIP/TAZO <=4 SENSITIVE Sensitive     * RARE ENTEROBACTER CLOACAE  Glucose, capillary     Status: Abnormal  Collection Time: 04/08/20  6:01 PM  Result Value Ref Range   Glucose-Capillary 145 (H) 70 - 99 mg/dL    Comment: Glucose reference range applies only to samples taken after fasting for at least 8 hours.  Glucose, capillary     Status: Abnormal   Collection Time: 04/08/20  9:24 PM  Result Value Ref Range   Glucose-Capillary 137 (H) 70 - 99 mg/dL    Comment: Glucose reference range applies only to samples taken after fasting for at least 8 hours.  Glucose, capillary     Status: Abnormal   Collection Time: 04/08/20  9:25 PM  Result Value Ref Range   Glucose-Capillary 143 (H) 70 - 99 mg/dL    Comment: Glucose reference range applies only to samples taken after fasting for at least 8 hours.  CBC     Status: Abnormal   Collection Time: 04/09/20  4:28 AM  Result Value Ref Range   WBC 10.3 4.0 - 10.5 K/uL   RBC 3.54 (L) 4.22 - 5.81 MIL/uL   Hemoglobin 11.6 (L) 13.0 - 17.0 g/dL   HCT 16.1 (L) 39 - 52 %   MCV 93.8 80.0 - 100.0 fL   MCH 32.8 26.0 - 34.0 pg   MCHC 34.9 30.0 - 36.0 g/dL   RDW 09.6 04.5 - 40.9 %   Platelets 284 150 - 400 K/uL   nRBC 0.0 0.0 - 0.2 %    Comment: Performed at [facility], [address] [place], [place] [address]  Basic metabolic panel     Status: Abnormal   Collection Time: 04/09/20  4:28 AM  Result Value Ref Range   Sodium 131 (L) 135 - 145 mmol/L   Potassium 4.6 3.5 - 5.1 mmol/L   Chloride 104 98 - 111 mmol/L   CO2 20 (L) 22 - 32 mmol/L   Glucose, Bld 204 (H) 70 - 99 mg/dL    Comment: Glucose reference range applies only to samples taken after fasting for at least 8 hours.   BUN 18 6 - 20 mg/dL   Creatinine, Ser 4.78 0.61 - 1.24 mg/dL   Calcium 9.4 8.9 - 29.5 mg/dL   GFR calc non Af Amer >60 >60 mL/min   GFR calc Af Amer >60 >60 mL/min   Anion gap 7 5 - 15    Comment: Performed at [facility], [address] [place], [place] [address]  Glucose, capillary     Status: Abnormal   Collection Time: 04/09/20  7:58 AM  Result Value Ref Range   Glucose-Capillary 200 (H) 70 - 99 mg/dL    Comment: Glucose reference range applies only to samples taken after fasting for at least 8 hours.  Vancomycin, trough     Status: Abnormal   Collection Time: 04/09/20  9:21 AM  Result Value Ref Range   Vancomycin Tr 10 (L) 15 - 20 ug/mL    Comment: Performed at [facility], [address], [place], [place] [address]  Glucose, capillary     Status: Abnormal   Collection Time: 04/09/20 11:42 AM  Result Value Ref Range   Glucose-Capillary 179 (H) 70 - 99 mg/dL    Comment: Glucose reference range applies only to samples taken after fasting for at least 8 hours.  Glucose, capillary     Status: Abnormal   Collection Time: 04/09/20  4:42 PM  Result Value Ref Range   Glucose-Capillary 276 (H) 70 - 99 mg/dL    Comment: Glucose reference range  applies only to samples taken after fasting for at least 8 hours.  Glucose, capillary     Status: Abnormal   Collection Time: 04/09/20  8:52 PM  Result Value Ref Range   Glucose-Capillary 200 (H) 70 - 99 mg/dL    Comment: Glucose reference range applies only to samples taken after fasting for at least 8 hours.  CBC     Status: Abnormal   Collection Time: 04/10/20  6:29 AM  Result Value Ref Range   WBC 7.7 4.0 - 10.5 K/uL   RBC 3.19 (L) 4.22 - 5.81 MIL/uL   Hemoglobin 10.3 (L) 13.0 - 17.0 g/dL   HCT 41.6 (L) 39 - 52 %   MCV 95.3 80.0 - 100.0 fL   MCH 32.3 26.0 - 34.0 pg   MCHC 33.9 30.0 - 36.0 g/dL   RDW 60.6 30.1 - 60.1 %   Platelets 259 150 - 400 K/uL   nRBC 0.0 0.0 - 0.2 %    Comment: Performed at [facility], [address] [place], [place] [address]  Basic metabolic panel     Status: Abnormal   Collection Time: 04/10/20  6:29 AM  Result Value Ref Range   Sodium 129 (L) 135 - 145 mmol/L   Potassium 4.4 3.5 - 5.1 mmol/L   Chloride 101 98  - 111 mmol/L   CO2 21 (L) 22 - 32 mmol/L   Glucose, Bld 290 (H) 70 - 99 mg/dL    Comment: Glucose reference range applies only to samples taken after fasting for at least 8 hours.   BUN 20 6 - 20 mg/dL   Creatinine, Ser 5.57 0.61 - 1.24 mg/dL   Calcium 9.1 8.9 - 32.2 mg/dL   GFR calc non Af Amer >60 >60 mL/min   GFR calc Af Amer >60 >60 mL/min   Anion gap 7 5 - 15    Comment: Performed at [facility], [address] [place], [place] [address]  Glucose, capillary     Status: Abnormal   Collection Time: 04/10/20  7:46 AM  Result Value Ref Range   Glucose-Capillary 293 (H) 70 - 99 mg/dL    Comment: Glucose reference range applies only to samples taken after fasting for at least 8 hours.  Glucose, capillary     Status: Abnormal   Collection Time: 04/10/20 12:07 PM  Result Value Ref Range   Glucose-Capillary 221 (H) 70 - 99 mg/dL    Comment: Glucose reference range applies only to samples taken after fasting for at least 8 hours.    Assessment/Plan:  Atherosclerosis of native arteries of the extremities with ulceration (HCC) Noninvasive studies today show noncompressible vessels on the right with biphasic waveforms.  On the left his ABIs 1.07 with biphasic waveforms consistent with good arterial flow after revascularization.  His perfusion is currently intact and adequate for wound healing.  Until the wound is completely healed, we will continue to follow him on close intervals and I will plan to see him back in 3 months.  Continue current medical regimen.  Hypertension blood pressure control important in reducing the progression of atherosclerotic disease. On appropriate oral medications.   CAD (coronary artery disease) Continue cardiac and antihypertensive medications as already ordered and reviewed, no changes at this time. Continue statin as ordered and reviewed, no changes at this time Nitrates PRN for chest pain   Diabetes (HCC) blood glucose control important in  reducing the progression of atherosclerotic disease. Also, involved in wound healing. On appropriate  medications.   Hyperlipidemia lipid control important in reducing the progression of atherosclerotic disease. Continue statin therapy       Festus Barren 06/08/2020, 1:29 PM   This note was created with Dragon medical transcription system.  Any errors from dictation are unintentional.

## 2020-06-08 NOTE — Assessment & Plan Note (Signed)
lipid control important in reducing the progression of atherosclerotic disease. Continue statin therapy  

## 2020-06-14 ENCOUNTER — Other Ambulatory Visit: Payer: Self-pay | Admitting: Podiatry

## 2020-06-14 LAB — AEROBIC/ANAEROBIC CULTURE W GRAM STAIN (SURGICAL/DEEP WOUND): Gram Stain: NONE SEEN

## 2020-06-14 NOTE — Addendum Note (Signed)
Addended by: Rosetta Posner on: 06/14/2020 04:49 PM   Modules accepted: Orders

## 2020-06-18 ENCOUNTER — Other Ambulatory Visit: Payer: Self-pay

## 2020-06-18 ENCOUNTER — Encounter
Admission: RE | Admit: 2020-06-18 | Discharge: 2020-06-18 | Disposition: A | Discharge status: Home / Self Care | Payer: Medicaid Other | Source: Ambulatory Visit | Attending: Podiatry | Admitting: Podiatry

## 2020-06-18 NOTE — Patient Instructions (Signed)
Your procedure is scheduled on: Friday June 25, 2020. Report to Day Surgery inside [facility] 2nd floor. To find out your arrival time please call [phone] between 1PM - 3PM on Thursday June 24, 2020.  Remember: Instructions that are not followed completely may result in serious medical risk,  up to and including death, or upon the discretion of your surgeon and anesthesiologist your  surgery may need to be rescheduled.     _X__ 1. Do not eat food after midnight the night before your procedure.                 No chewing gum or hard candies. You may drink clear liquids up to 2 hours                 before you are scheduled to arrive for your surgery- DO not drink clear                 liquids within 2 hours of the start of your surgery.                 Clear Liquids include:  water, apple juice without pulp, clear Gatorade, G2 or                  Gatorade Zero (avoid Red/Purple/Blue), Black Coffee or Tea (Do not add                 anything to coffee or tea).  __X__2.   Complete the "Gatorade Zero or G2 "Drink" provided to you, 2 hours before arrival.  __X__3.  On the morning of surgery brush your teeth with toothpaste and water, you                may rinse your mouth with mouthwash if you wish.  Do not swallow any toothpaste of mouthwash.     _X__ 4.  No Alcohol for 24 hours before or after surgery.   _X__ 5.  Do Not Smoke or use e-cigarettes For 24 Hours Prior to Your Surgery.                 Do not use any chewable tobacco products for at least 6 hours prior to                 Surgery.  __X_  6.  Do not use any recreational drugs (marijuana, cocaine, heroin, ecstasy, MDMA or other)                For at least one week prior to your surgery.  Combination of these drugs with anesthesia                May have life threatening results.  __X__  7.  Notify your doctor if there is any change in your medical condition      (cold, fever, infections).      Do not wear jewelry, make-up, hairpins, clips or nail polish. Do not wear lotions, powders, or perfumes. You may wear deodorant. Do not shave 48 hours prior to surgery. Men may shave face and neck. Do not bring valuables to the hospital.    [facility] is not responsible for any belongings or valuables.  Contacts, dentures or bridgework may not be worn into surgery. Leave your suitcase in the car. After surgery it may be brought to your room. For patients admitted to the hospital, discharge time is determined by your treatment team.   Patients discharged  the day of surgery will not be allowed to drive home.   Make arrangements for someone to be with you for the first 24 hours of your Same Day Discharge.  __x__ Take these medicines the morning of surgery with A SIP OF WATER:    1. carvedilol (COREG) 25 MG  2. cloNIDine (CATAPRES) 0.3 MG  3. amLODipine (NORVASC) 10 MG     ____ Fleet Enema (as directed)   __x__ Use CHG wipes as directed  ____ Use Benzoyl Peroxide Gel as instructed  ____ Use inhalers on the day of surgery  ____ Stop metformin 2 days prior to surgery    __x__ Take 1/2 of usual insulin dose the night before surgery. No insulin the morning          of surgery.   __x__ Stop aspirin as instructed by your doctor.  __x__ Stop Anti-inflammatories such as Ibuprofen, Aleve, Advil, naproxen and or BC powders.   __x__ Stop supplements until after surgery.    __x__ Do not start any herbal supplements before your procedure.    If you have any questions regarding your pre-procedure instructions,  Please call Pre-admit Testing at [phone].

## 2020-06-21 ENCOUNTER — Encounter: Payer: Self-pay | Admitting: Cardiology

## 2020-06-21 ENCOUNTER — Ambulatory Visit (INDEPENDENT_AMBULATORY_CARE_PROVIDER_SITE_OTHER): Payer: Medicaid Other | Admitting: Cardiology

## 2020-06-21 ENCOUNTER — Other Ambulatory Visit: Payer: Self-pay

## 2020-06-21 VITALS — BP 140/90 | HR 70 | Ht 73.0 in | Wt 249.0 lb

## 2020-06-21 DIAGNOSIS — I255 Ischemic cardiomyopathy: Secondary | ICD-10-CM

## 2020-06-21 DIAGNOSIS — Z01818 Encounter for other preprocedural examination: Secondary | ICD-10-CM | POA: Diagnosis not present

## 2020-06-21 DIAGNOSIS — F17200 Nicotine dependence, unspecified, uncomplicated: Secondary | ICD-10-CM | POA: Diagnosis not present

## 2020-06-21 DIAGNOSIS — IMO0001 Reserved for inherently not codable concepts without codable children: Secondary | ICD-10-CM

## 2020-06-21 DIAGNOSIS — I2510 Atherosclerotic heart disease of native coronary artery without angina pectoris: Secondary | ICD-10-CM | POA: Diagnosis not present

## 2020-06-21 NOTE — Progress Notes (Signed)
Cardiology Office Note:    Date:  06/21/2020   ID:  Marc [name], DOB 19-Apr-1961, MRN [id number]  PCP:  [facility], [facility] Cardiologist:  No primary care provider on file.  [facility] Electrophysiologist:  None   Referring MD: [facility], [employer]*   Chief Complaint  Patient presents with  . New Patient (Initial Visit)    Cardiac clearance  Foot surgery---"taking a bone out of his foot and fixing the veins in his leg"    History of Present Illness:    Marc [name] is a 59 y.o. male with a hx of CAD/CABG x 4 2015 ([place] to LAD SVG to Ramus /OM2, OM3) hypertension, hyperlipidemia, ICM EF 40-45%%, PAD, postop A. fib, current smoker who presents for preop evaluation.  Patient has history of nonhealing ulceration of the left foot, underwent a left great toe amputation with subsequent wound.  He is being followed up by vascular surgery.  Noninvasive studies have shown noncompressible vessels on the right with ABIs 1.07.  Podiatry is planning resection of left foot wound to improve wound healing.  He denies symptoms of chest pain, shortness of breath at rest or with exertion.  He is working on quitting smoking.  He had an episode of atrial fibrillation after surgery years ago, took Coumadin for about 30 days and then stop.  Denies any symptoms of palpitations or recurrence of atrial fibrillation.  Last echocardiogram 03/2018 showed mild to moderately reduced ejection fraction, EF 40 to 45%.  Impaired relaxation.  Currently on good cardiac medications including Coreg, lisinopril, Aldactone, aspirin, Lipitor 80 mg.  Past Medical History:  Diagnosis Date  . Anxiety   . Cardiomyopathy (HCC)    45-50%  . CHF (congestive heart failure) (HCC)   . Chronic systolic heart failure (HCC) 07/2013   Ejection fraction of 45-50% with possible inferior wall hypokinesis  . Coronary artery disease    Significant left main and Severe three-vessel coronary artery  disease in December of 2014. He underwent CABG in January of 2015 with LIMA to LAD, sequential SVG to ramus/OM 2 and SVG to OM 3  . Diabetes mellitus without complication (HCC)   . Dysrhythmia   . Hyperlipidemia   . Hypertension   . Medical non-compliance   . MI (myocardial infarction) (HCC)   . Obesity   . Tobacco abuse     Past Surgical History:  Procedure Laterality Date  . AMPUTATION Left 04/08/2020   Procedure: AMPUTATION RAY;  Surgeon: Rosetta Posner, DPM;  Location: [facility] ORS;  Service: Podiatry;  Laterality: Left;  . AMPUTATION TOE Right 02/29/2016   Procedure: AMPUTATION TOE;  Surgeon: Linus Galas, DPM;  Location: [facility] ORS;  Service: Podiatry;  Laterality: Right;  Great toe  . CARDIAC CATHETERIZATION     [facility] no stent  . CORONARY ARTERY BYPASS GRAFT N/A 09/16/2013   Procedure: CORONARY ARTERY BYPASS GRAFTING (CABG);  Surgeon: [name], MD;  Location: [facility] OR;  Service: Open Heart Surgery;  Laterality: N/A;  CABG x four, using left internal mammary artery and right leg greater saphenous vein  . INTRAOPERATIVE TRANSESOPHAGEAL ECHOCARDIOGRAM N/A 09/16/2013   Procedure: INTRAOPERATIVE TRANSESOPHAGEAL ECHOCARDIOGRAM;  Surgeon: [name], MD;  Location: [facility] OR;  Service: Open Heart Surgery;  Laterality: N/A;  . knee surgery Right   . LASIK Bilateral   . LOWER EXTREMITY ANGIOGRAPHY Left 04/07/2020   Procedure: Lower Extremity Angiography;  Surgeon: Renford Dills, MD;  Location: [facility] INVASIVE CV LAB;  Service: Cardiovascular;  Laterality: Left;  . LOWER EXTREMITY ANGIOGRAPHY Left 04/09/2020   Procedure: Lower Extremity Angiography (Pedal Approach);  Surgeon: Renford Dills, MD;  Location: [facility] INVASIVE CV LAB;  Service: Cardiovascular;  Laterality: Left;    Current Medications: Current Meds  Medication Sig  . amLODipine (NORVASC) 10 MG tablet Take 10 mg by mouth daily. for high blood pressure  . amoxicillin (AMOXIL) 875 MG tablet Take 875 mg by mouth 2 (two) times  daily.  Marland Kitchen aspirin EC 81 MG tablet Take 81 mg by mouth daily. Swallow whole.  Marland Kitchen atorvastatin (LIPITOR) 80 MG tablet Take 80 mg by mouth every evening.   . calcium carbonate (TUMS - DOSED IN MG ELEMENTAL CALCIUM) 500 MG chewable tablet Chew 1 tablet by mouth daily as needed for indigestion or heartburn.  . carvedilol (COREG) 25 MG tablet Take 0.5 tablets (12.5 mg total) by mouth 2 (two) times daily.  . cloNIDine (CATAPRES) 0.3 MG tablet Take 0.3 mg by mouth 2 (two) times daily.   Marland Kitchen glipiZIDE (GLUCOTROL XL) 10 MG 24 hr tablet Take 10 mg by mouth daily.   . insulin glargine (LANTUS SOLOSTAR) 100 UNIT/ML Solostar Pen Inject 65 Units into the skin daily.  Marland Kitchen lisinopril (ZESTRIL) 40 MG tablet Take 40 mg by mouth daily.  Marland Kitchen spironolactone (ALDACTONE) 50 MG tablet Take 50 mg by mouth 2 (two) times daily.   . [DISCONTINUED] LANTUS SOLOSTAR 100 UNIT/ML Solostar Pen Inject 52 Units into the skin every evening. (Patient taking differently: Inject 65 Units into the skin every morning. )     Allergies:   Patient has no known allergies.   Social History   Socioeconomic History  . Marital status: Divorced    Spouse name: Not on file  . Number of children: Not on file  . Years of education: Not on file  . Highest education level: Not on file  Occupational History  . Not on file  Tobacco Use  . Smoking status: Current Some Day Smoker    Packs/day: 1.00    Years: 45.00    Pack years: 45.00    Types: Cigarettes  . Smokeless tobacco: Never Used  . Tobacco comment: trying to quit  Vaping Use  . Vaping Use: Never used  Substance and Sexual Activity  . Alcohol use: No    Alcohol/week: 0.0 standard drinks  . Drug use: No  . Sexual activity: Not on file  Other Topics Concern  . Not on file  Social History Narrative  . Not on file   Social Determinants of Health   Financial Resource Strain:   . Difficulty of Paying Living Expenses: Not on file  Food Insecurity:   . Worried About Brewing technologist in the Last Year: Not on file  . Ran Out of Food in the Last Year: Not on file  Transportation Needs:   . Lack of Transportation (Medical): Not on file  . Lack of Transportation (Non-Medical): Not on file  Physical Activity:   . Days of Exercise per Week: Not on file  . Minutes of Exercise per Session: Not on file  Stress:   . Feeling of Stress : Not on file  Social Connections:   . Frequency of Communication with Friends and Family: Not on file  . Frequency of Social Gatherings with Friends and Family: Not on file  . Attends Religious Services: Not on file  . Active Member of Clubs or Organizations: Not on file  . Attends Club or  Organization Meetings: Not on file  . Marital Status: Not on file     Family History: The patient's family history includes Heart disease in his mother.  ROS:   Please see the history of present illness.     All other systems reviewed and are negative.  EKGs/Labs/Other Studies Reviewed:    The following studies were reviewed today:   EKG:  EKG is  ordered today.  The ekg ordered today demonstrates sinus rhythm, first-degree AV block, occasional PVCs.  Recent Labs: 04/06/2020: ALT 16 04/07/2020: Magnesium 1.8 04/10/2020: BUN 20; Creatinine, Ser 1.18; Hemoglobin 10.3; Platelets 259; Potassium 4.4; Sodium 129  Recent Lipid Panel    Component Value Date/Time   CHOL 106 03/14/2018 0538   CHOL 135 07/21/2013 0759   TRIG 132 03/14/2018 0538   TRIG 152 07/21/2013 0759   HDL 29 (L) 03/14/2018 0538   HDL 39 (L) 07/21/2013 0759   CHOLHDL 3.7 03/14/2018 0538   VLDL 26 03/14/2018 0538   VLDL 30 07/21/2013 0759   LDLCALC 51 03/14/2018 0538   LDLCALC 66 07/21/2013 0759     Risk Assessment/Calculations:      Physical Exam:    VS:  BP 140/90   Pulse 70   Ht 6\' 1"  (1.854 m)   Wt 249 lb (112.9 kg)   BMI 32.85 kg/m     Wt Readings from Last 3 Encounters:  06/21/20 249 lb (112.9 kg)  06/18/20 250 lb (113.4 kg)  06/08/20 252 lb (114.3 kg)       GEN:  Well nourished, well developed in no acute distress HEENT: Normal NECK: No JVD; No carotid bruits LYMPHATICS: No lymphadenopathy CARDIAC: RRR, no murmurs, rubs, gallops RESPIRATORY:  Clear to auscultation without rales, wheezing or rhonchi  ABDOMEN: Soft, non-tender, non-distended MUSCULOSKELETAL:  No edema; No deformity  SKIN: Warm and dry NEUROLOGIC:  Alert and oriented x 3 PSYCHIATRIC:  Normal affect   ASSESSMENT:    1. Pre-op evaluation   2. Coronary artery disease involving native coronary artery of native heart without angina pectoris   3. Ischemic cardiomyopathy   4. Smoking    PLAN:    In order of problems listed above:  1. Left foot surgery is being planned by podiatry.  Patient denies any cardiac symptoms of chest pain or shortness of breath.  Last echocardiogram showed mildly reduced ejection, which is chronic.  He is on optimal guideline directed medical therapy for heart failure and also CAD.  No additional testing or intervention is indicated or will mitigate patient's risk.  Patient can proceed from a cardiac perspective without any additional testing. 2. Patient with history of CAD, status post CABG x4.  Denies symptoms of chest pain.  Continue aspirin 81, Lipitor 80. 3. History of ischemic cardiomyopathy, EF 40 to 45%.  Repeat echocardiogram. 4. Evette Cristal is a current smoker, smoking cessation advised.   Follow-up after echocardiogram.  Total encounter time 60 minutes  Greater than 50% was spent in counseling and coordination of care with the patient  Medication Adjustments/Labs and Tests Ordered: Current medicines are reviewed at length with the patient today.  Concerns regarding medicines are outlined above.  Orders Placed This Encounter  Procedures  . EKG 12-Lead  . ECHOCARDIOGRAM COMPLETE   No orders of the defined types were placed in this encounter.   Patient Instructions  Medication Instructions:  Your physician recommends that you continue  on your current medications as directed. Please refer to the Current Medication list given to you today.  *  If you need a refill on your cardiac medications before your next appointment, please call your pharmacy*  Lab Work: none If you have labs (blood work) drawn today and your tests are completely normal, you will receive your results only by: Marland Kitchen MyChart Message (if you have MyChart) OR . A paper copy in the mail If you have any lab test that is abnormal or we need to change your treatment, we will call you to review the results.  Testing/Procedures: Your physician has requested that you have an echocardiogram. Echocardiography is a painless test that uses sound waves to create images of your heart. It provides your doctor with information about the size and shape of your heart and how well your heart's chambers and valves are working. This procedure takes approximately one hour. There are no restrictions for this procedure. You may get an IV, if needed, to receive an ultrasound enhancing agent through to better visualize your heart.   Follow-Up: At [facility], you and your health needs are our priority.  As part of our continuing mission to provide you with exceptional heart care, we have created designated Provider Care Teams.  These Care Teams include your primary Cardiologist (physician) and Advanced Practice Providers (APPs -  Physician Assistants and Nurse Practitioners) who all work together to provide you with the care you need, when you need it.  We recommend signing up for the patient portal called "MyChart".  Sign up information is provided on this After Visit Summary.  MyChart is used to connect with patients for Virtual Visits (Telemedicine).  Patients are able to view lab/test results, encounter notes, upcoming appointments, etc.  Non-urgent messages can be sent to your provider as well.   To learn more about what you can do with MyChart, go to [email or web address].    Your  next appointment:   After testing.   The format for your next appointment:   In Person  Provider:   You may see Dr. Azucena Cecil or one of the following Advanced Practice Providers on your designated Care Team:    Nicolasa Ducking, NP  Eula Listen, PA-C  Marisue Ivan, PA-C  Cadence [place], [place]     Signed, Debbe Odea, MD  06/21/2020 5:34 PM    [facility]

## 2020-06-21 NOTE — Patient Instructions (Signed)
Medication Instructions:  Your physician recommends that you continue on your current medications as directed. Please refer to the Current Medication list given to you today.  *If you need a refill on your cardiac medications before your next appointment, please call your pharmacy*  Lab Work: none If you have labs (blood work) drawn today and your tests are completely normal, you will receive your results only by: Marland Kitchen MyChart Message (if you have MyChart) OR . A paper copy in the mail If you have any lab test that is abnormal or we need to change your treatment, we will call you to review the results.  Testing/Procedures: Your physician has requested that you have an echocardiogram. Echocardiography is a painless test that uses sound waves to create images of your heart. It provides your doctor with information about the size and shape of your heart and how well your heart's chambers and valves are working. This procedure takes approximately one hour. There are no restrictions for this procedure. You may get an IV, if needed, to receive an ultrasound enhancing agent through to better visualize your heart.   Follow-Up: At [facility], you and your health needs are our priority.  As part of our continuing mission to provide you with exceptional heart care, we have created designated Provider Care Teams.  These Care Teams include your primary Cardiologist (physician) and Advanced Practice Providers (APPs -  Physician Assistants and Nurse Practitioners) who all work together to provide you with the care you need, when you need it.  We recommend signing up for the patient portal called "MyChart".  Sign up information is provided on this After Visit Summary.  MyChart is used to connect with patients for Virtual Visits (Telemedicine).  Patients are able to view lab/test results, encounter notes, upcoming appointments, etc.  Non-urgent messages can be sent to your provider as well.   To learn more about  what you can do with MyChart, go to [email or web address].    Your next appointment:   After testing.   The format for your next appointment:   In Person  Provider:   You may see Dr. Azucena Cecil or one of the following Advanced Practice Providers on your designated Care Team:    Nicolasa Ducking, NP  Eula Listen, PA-C  Marisue Ivan, PA-C  Cadence [place], [place]

## 2020-06-22 ENCOUNTER — Ambulatory Visit (INDEPENDENT_AMBULATORY_CARE_PROVIDER_SITE_OTHER): Payer: Medicaid Other

## 2020-06-22 DIAGNOSIS — I255 Ischemic cardiomyopathy: Secondary | ICD-10-CM

## 2020-06-22 DIAGNOSIS — I2510 Atherosclerotic heart disease of native coronary artery without angina pectoris: Secondary | ICD-10-CM

## 2020-06-22 LAB — ECHOCARDIOGRAM COMPLETE
AR max vel: 4.14 cm2
AV Area VTI: 3.91 cm2
AV Area mean vel: 3.54 cm2
AV Mean grad: 4 mmHg
AV Peak grad: 6.6 mmHg
Ao pk vel: 1.28 m/s
Area-P 1/2: 2.75 cm2
S' Lateral: 4.6 cm
Single Plane A4C EF: 48.7 %

## 2020-06-22 MED ORDER — PERFLUTREN LIPID MICROSPHERE
1.0000 mL | INTRAVENOUS | Status: AC | PRN
  Administered 2020-06-22: 2 mL via INTRAVENOUS

## 2020-06-23 ENCOUNTER — Encounter
Admission: RE | Admit: 2020-06-23 | Discharge: 2020-06-23 | Disposition: A | Discharge status: Home / Self Care | Payer: Medicaid Other | Source: Ambulatory Visit | Attending: Podiatry | Admitting: Podiatry

## 2020-06-23 ENCOUNTER — Other Ambulatory Visit: Payer: Self-pay

## 2020-06-23 DIAGNOSIS — Z01812 Encounter for preprocedural laboratory examination: Secondary | ICD-10-CM | POA: Diagnosis present

## 2020-06-23 DIAGNOSIS — Z20822 Contact with and (suspected) exposure to covid-19: Secondary | ICD-10-CM | POA: Insufficient documentation

## 2020-06-23 DIAGNOSIS — I10 Essential (primary) hypertension: Secondary | ICD-10-CM | POA: Diagnosis not present

## 2020-06-23 LAB — SARS CORONAVIRUS 2 (TAT 6-24 HRS): SARS Coronavirus 2: NEGATIVE

## 2020-06-24 MED ORDER — CHLORHEXIDINE GLUCONATE 0.12 % MT SOLN: 15.0000 mL | Freq: Once | OROMUCOSAL | Status: AC

## 2020-06-24 MED ORDER — ORAL CARE MOUTH RINSE: 15.0000 mL | Freq: Once | OROMUCOSAL | Status: AC

## 2020-06-24 MED ORDER — POVIDONE-IODINE 10 % EX SWAB: 2.0000 "application " | Freq: Once | CUTANEOUS | Status: DC

## 2020-06-24 MED ORDER — FAMOTIDINE 20 MG PO TABS: 20.0000 mg | ORAL_TABLET | Freq: Once | ORAL | Status: AC

## 2020-06-24 MED ORDER — CEFAZOLIN SODIUM-DEXTROSE 2-4 GM/100ML-% IV SOLN
2.0000 g | INTRAVENOUS | Status: AC
  Administered 2020-06-25: 2 g via INTRAVENOUS

## 2020-06-24 MED ORDER — CHLORHEXIDINE GLUCONATE 4 % EX LIQD: 60.0000 mL | Freq: Once | CUTANEOUS | Status: DC

## 2020-06-24 MED ORDER — SODIUM CHLORIDE 0.9 % IV SOLN: INTRAVENOUS | Status: DC

## 2020-06-25 ENCOUNTER — Other Ambulatory Visit: Payer: Self-pay

## 2020-06-25 ENCOUNTER — Ambulatory Visit
Admission: RE | Admit: 2020-06-25 | Discharge: 2020-06-25 | Disposition: A | Discharge status: Home / Self Care | Payer: Medicaid Other | Attending: Podiatry | Admitting: Podiatry

## 2020-06-25 ENCOUNTER — Encounter: Payer: Self-pay | Admitting: Podiatry

## 2020-06-25 ENCOUNTER — Ambulatory Visit: Payer: Medicaid Other | Admitting: Urgent Care

## 2020-06-25 ENCOUNTER — Encounter
Admission: RE | Disposition: A | Discharge status: Home / Self Care | Payer: Self-pay | Source: Home / Self Care | Attending: Podiatry

## 2020-06-25 DIAGNOSIS — I110 Hypertensive heart disease with heart failure: Secondary | ICD-10-CM | POA: Diagnosis not present

## 2020-06-25 DIAGNOSIS — I252 Old myocardial infarction: Secondary | ICD-10-CM | POA: Insufficient documentation

## 2020-06-25 DIAGNOSIS — E1169 Type 2 diabetes mellitus with other specified complication: Secondary | ICD-10-CM | POA: Diagnosis present

## 2020-06-25 DIAGNOSIS — Z7902 Long term (current) use of antithrombotics/antiplatelets: Secondary | ICD-10-CM | POA: Insufficient documentation

## 2020-06-25 DIAGNOSIS — Z951 Presence of aortocoronary bypass graft: Secondary | ICD-10-CM | POA: Insufficient documentation

## 2020-06-25 DIAGNOSIS — E669 Obesity, unspecified: Secondary | ICD-10-CM | POA: Insufficient documentation

## 2020-06-25 DIAGNOSIS — I5022 Chronic systolic (congestive) heart failure: Secondary | ICD-10-CM | POA: Insufficient documentation

## 2020-06-25 DIAGNOSIS — F17210 Nicotine dependence, cigarettes, uncomplicated: Secondary | ICD-10-CM | POA: Diagnosis not present

## 2020-06-25 DIAGNOSIS — Z6832 Body mass index (BMI) 32.0-32.9, adult: Secondary | ICD-10-CM | POA: Insufficient documentation

## 2020-06-25 DIAGNOSIS — E1142 Type 2 diabetes mellitus with diabetic polyneuropathy: Secondary | ICD-10-CM | POA: Insufficient documentation

## 2020-06-25 DIAGNOSIS — I429 Cardiomyopathy, unspecified: Secondary | ICD-10-CM | POA: Insufficient documentation

## 2020-06-25 DIAGNOSIS — I7025 Atherosclerosis of native arteries of other extremities with ulceration: Secondary | ICD-10-CM

## 2020-06-25 DIAGNOSIS — Z89411 Acquired absence of right great toe: Secondary | ICD-10-CM | POA: Diagnosis not present

## 2020-06-25 DIAGNOSIS — Z7984 Long term (current) use of oral hypoglycemic drugs: Secondary | ICD-10-CM | POA: Insufficient documentation

## 2020-06-25 DIAGNOSIS — E11621 Type 2 diabetes mellitus with foot ulcer: Secondary | ICD-10-CM | POA: Diagnosis not present

## 2020-06-25 DIAGNOSIS — L97524 Non-pressure chronic ulcer of other part of left foot with necrosis of bone: Secondary | ICD-10-CM | POA: Diagnosis not present

## 2020-06-25 DIAGNOSIS — Z794 Long term (current) use of insulin: Secondary | ICD-10-CM | POA: Insufficient documentation

## 2020-06-25 DIAGNOSIS — L02612 Cutaneous abscess of left foot: Secondary | ICD-10-CM

## 2020-06-25 DIAGNOSIS — Z7982 Long term (current) use of aspirin: Secondary | ICD-10-CM | POA: Diagnosis not present

## 2020-06-25 DIAGNOSIS — Z9119 Patient's noncompliance with other medical treatment and regimen: Secondary | ICD-10-CM | POA: Insufficient documentation

## 2020-06-25 DIAGNOSIS — E785 Hyperlipidemia, unspecified: Secondary | ICD-10-CM | POA: Diagnosis not present

## 2020-06-25 DIAGNOSIS — Z79899 Other long term (current) drug therapy: Secondary | ICD-10-CM | POA: Insufficient documentation

## 2020-06-25 DIAGNOSIS — Z89412 Acquired absence of left great toe: Secondary | ICD-10-CM | POA: Diagnosis not present

## 2020-06-25 DIAGNOSIS — I2510 Atherosclerotic heart disease of native coronary artery without angina pectoris: Secondary | ICD-10-CM | POA: Diagnosis not present

## 2020-06-25 DIAGNOSIS — M869 Osteomyelitis, unspecified: Secondary | ICD-10-CM | POA: Diagnosis present

## 2020-06-25 HISTORY — PX: AMPUTATION: SHX166

## 2020-06-25 LAB — GLUCOSE, CAPILLARY
Glucose-Capillary: 183 mg/dL — ABNORMAL HIGH (ref 70–99)
Glucose-Capillary: 157 mg/dL — ABNORMAL HIGH (ref 70–99)

## 2020-06-25 SURGERY — AMPUTATION, FOOT, RAY
Anesthesia: General | Laterality: Left

## 2020-06-25 MED ORDER — BUPIVACAINE HCL (PF) 0.5 % IJ SOLN
INTRAMUSCULAR | Status: AC
  Filled 2020-06-25: qty 30

## 2020-06-25 MED ORDER — LIDOCAINE HCL (PF) 2 % IJ SOLN
INTRAMUSCULAR | Status: AC
  Filled 2020-06-25: qty 5

## 2020-06-25 MED ORDER — OXYCODONE HCL 5 MG PO TABS: 5.0000 mg | ORAL_TABLET | Freq: Once | ORAL | Status: DC | PRN

## 2020-06-25 MED ORDER — FENTANYL CITRATE (PF) 100 MCG/2ML IJ SOLN
INTRAMUSCULAR | Status: AC
  Filled 2020-06-25: qty 2

## 2020-06-25 MED ORDER — MIDAZOLAM HCL 2 MG/2ML IJ SOLN
INTRAMUSCULAR | Status: DC | PRN
  Administered 2020-06-25: 2 mg via INTRAVENOUS

## 2020-06-25 MED ORDER — PHENYLEPHRINE HCL (PRESSORS) 10 MG/ML IV SOLN
INTRAVENOUS | Status: DC | PRN
  Administered 2020-06-25 (×3): 50 ug via INTRAVENOUS

## 2020-06-25 MED ORDER — FAMOTIDINE 20 MG PO TABS
ORAL_TABLET | ORAL | Status: AC
  Administered 2020-06-25: 20 mg via ORAL
  Filled 2020-06-25: qty 1

## 2020-06-25 MED ORDER — CEFAZOLIN SODIUM-DEXTROSE 2-4 GM/100ML-% IV SOLN
INTRAVENOUS | Status: AC
  Filled 2020-06-25: qty 100

## 2020-06-25 MED ORDER — HYDROCODONE-ACETAMINOPHEN 5-325 MG PO TABS: 1.0000 | ORAL_TABLET | Freq: Four times a day (QID) | ORAL | 0 refills | Status: AC | PRN

## 2020-06-25 MED ORDER — BUPIVACAINE HCL 0.5 % IJ SOLN
INTRAMUSCULAR | Status: DC | PRN
  Administered 2020-06-25: 20 mL

## 2020-06-25 MED ORDER — CHLORHEXIDINE GLUCONATE 0.12 % MT SOLN
OROMUCOSAL | Status: AC
  Administered 2020-06-25: 15 mL via OROMUCOSAL
  Filled 2020-06-25: qty 15

## 2020-06-25 MED ORDER — VANCOMYCIN HCL 1000 MG IV SOLR
INTRAVENOUS | Status: AC
  Filled 2020-06-25: qty 1000

## 2020-06-25 MED ORDER — PROPOFOL 10 MG/ML IV BOLUS
INTRAVENOUS | Status: DC | PRN
  Administered 2020-06-25: 80 mg via INTRAVENOUS

## 2020-06-25 MED ORDER — LIDOCAINE HCL (CARDIAC) PF 100 MG/5ML IV SOSY
PREFILLED_SYRINGE | INTRAVENOUS | Status: DC | PRN
  Administered 2020-06-25: 70 mg via INTRAVENOUS

## 2020-06-25 MED ORDER — EPHEDRINE SULFATE 50 MG/ML IJ SOLN
INTRAMUSCULAR | Status: DC | PRN
  Administered 2020-06-25: 10 mg via INTRAVENOUS
  Administered 2020-06-25: 15 mg via INTRAVENOUS
  Administered 2020-06-25 (×3): 5 mg via INTRAVENOUS

## 2020-06-25 MED ORDER — DEXAMETHASONE SODIUM PHOSPHATE 10 MG/ML IJ SOLN
INTRAMUSCULAR | Status: AC
  Filled 2020-06-25: qty 1

## 2020-06-25 MED ORDER — NEOMYCIN-POLYMYXIN B GU 40-200000 IR SOLN
Status: AC
  Filled 2020-06-25: qty 2

## 2020-06-25 MED ORDER — OXYCODONE HCL 5 MG/5ML PO SOLN: 5.0000 mg | Freq: Once | ORAL | Status: DC | PRN

## 2020-06-25 MED ORDER — MIDAZOLAM HCL 2 MG/2ML IJ SOLN
INTRAMUSCULAR | Status: AC
  Filled 2020-06-25: qty 2

## 2020-06-25 MED ORDER — ONDANSETRON HCL 4 MG/2ML IJ SOLN
INTRAMUSCULAR | Status: AC
  Filled 2020-06-25: qty 2

## 2020-06-25 MED ORDER — DEXAMETHASONE SODIUM PHOSPHATE 10 MG/ML IJ SOLN
INTRAMUSCULAR | Status: DC | PRN
  Administered 2020-06-25: 10 mg via INTRAVENOUS

## 2020-06-25 MED ORDER — ONDANSETRON HCL 4 MG/2ML IJ SOLN
INTRAMUSCULAR | Status: DC | PRN
  Administered 2020-06-25: 4 mg via INTRAVENOUS

## 2020-06-25 MED ORDER — FENTANYL CITRATE (PF) 100 MCG/2ML IJ SOLN
INTRAMUSCULAR | Status: DC | PRN
  Administered 2020-06-25: 50 ug via INTRAVENOUS

## 2020-06-25 MED ORDER — ONDANSETRON HCL 4 MG/2ML IJ SOLN: 4.0000 mg | Freq: Once | INTRAMUSCULAR | Status: DC | PRN

## 2020-06-25 MED ORDER — PROPOFOL 10 MG/ML IV BOLUS
INTRAVENOUS | Status: AC
  Filled 2020-06-25: qty 20

## 2020-06-25 MED ORDER — FENTANYL CITRATE (PF) 100 MCG/2ML IJ SOLN: 25.0000 ug | INTRAMUSCULAR | Status: DC | PRN

## 2020-06-25 SURGICAL SUPPLY — 50 items
BLADE MED AGGRESSIVE (BLADE) ×3 IMPLANT
BLADE OSC/SAGITTAL MD 5.5X18 (BLADE) IMPLANT
BLADE SURG 15 STRL LF DISP TIS (BLADE) IMPLANT
BLADE SURG 15 STRL SS (BLADE)
BLADE SURG MINI STRL (BLADE) IMPLANT
BNDG CONFORM 2 STRL LF (GAUZE/BANDAGES/DRESSINGS) IMPLANT
BNDG ELASTIC 4X5.8 VLCR STR LF (GAUZE/BANDAGES/DRESSINGS) ×3 IMPLANT
BNDG ESMARK 4X12 TAN STRL LF (GAUZE/BANDAGES/DRESSINGS) ×3 IMPLANT
BNDG GAUZE 4.5X4.1 6PLY STRL (MISCELLANEOUS) ×3 IMPLANT
CANISTER SUCT 1200ML W/VALVE (MISCELLANEOUS) ×3 IMPLANT
CNTNR SPEC 2.5X3XGRAD LEK (MISCELLANEOUS) ×1
CONT SPEC 4OZ STER OR WHT (MISCELLANEOUS) ×2
CONT SPEC 4OZ STRL OR WHT (MISCELLANEOUS) ×1
CONTAINER SPEC 2.5X3XGRAD LEK (MISCELLANEOUS) ×1 IMPLANT
COVER WAND RF STERILE (DRAPES) ×3 IMPLANT
CUFF TOURN DUAL PL 12 NO SLV (MISCELLANEOUS) IMPLANT
CUFF TOURN SGL QUICK 18X4 (TOURNIQUET CUFF) ×3 IMPLANT
DRAPE FLUOR MINI C-ARM 54X84 (DRAPES) IMPLANT
DURAPREP 26ML APPLICATOR (WOUND CARE) ×3 IMPLANT
ELECT REM PT RETURN 9FT ADLT (ELECTROSURGICAL) ×3
ELECTRODE REM PT RTRN 9FT ADLT (ELECTROSURGICAL) ×1 IMPLANT
GAUZE SPONGE 4X4 12PLY STRL (GAUZE/BANDAGES/DRESSINGS) ×3 IMPLANT
GAUZE XEROFORM 1X8 LF (GAUZE/BANDAGES/DRESSINGS) ×3 IMPLANT
GLOVE BIO SURGEON STRL SZ7 (GLOVE) ×3 IMPLANT
GLOVE INDICATOR 7.0 STRL GRN (GLOVE) ×3 IMPLANT
GOWN STRL REUS W/ TWL LRG LVL3 (GOWN DISPOSABLE) ×2 IMPLANT
GOWN STRL REUS W/TWL LRG LVL3 (GOWN DISPOSABLE) ×6
HANDPIECE VERSAJET DEBRIDEMENT (MISCELLANEOUS) IMPLANT
KIT TURNOVER KIT A (KITS) ×3 IMPLANT
LABEL OR SOLS (LABEL) ×3 IMPLANT
NEEDLE FILTER BLUNT 18X 1/2SAF (NEEDLE) ×2
NEEDLE FILTER BLUNT 18X1 1/2 (NEEDLE) ×1 IMPLANT
NEEDLE HYPO 25X1 1.5 SAFETY (NEEDLE) ×6 IMPLANT
NS IRRIG 500ML POUR BTL (IV SOLUTION) ×3 IMPLANT
PACK EXTREMITY (MISCELLANEOUS) ×3 IMPLANT
SOL .9 NS 3000ML IRR  AL (IV SOLUTION)
SOL .9 NS 3000ML IRR AL (IV SOLUTION)
SOL .9 NS 3000ML IRR UROMATIC (IV SOLUTION) IMPLANT
SOL PREP PVP 2OZ (MISCELLANEOUS) ×3
SOLUTION PREP PVP 2OZ (MISCELLANEOUS) ×1 IMPLANT
STOCKINETTE STRL 6IN 960660 (GAUZE/BANDAGES/DRESSINGS) ×3 IMPLANT
SUT ETHILON 3-0 FS-10 30 BLK (SUTURE) ×6
SUT VIC AB 3-0 SH 27 (SUTURE) ×3
SUT VIC AB 3-0 SH 27X BRD (SUTURE) ×1 IMPLANT
SUTURE EHLN 3-0 FS-10 30 BLK (SUTURE) ×2 IMPLANT
SWAB CULTURE AMIES ANAERIB BLU (MISCELLANEOUS) IMPLANT
SYR 10ML LL (SYRINGE) ×3 IMPLANT
SYR BULB IRRIG 60ML STRL (SYRINGE) ×3 IMPLANT
TUBING CONNECTING 10 (TUBING) ×2 IMPLANT
TUBING CONNECTING 10' (TUBING) ×1

## 2020-06-25 NOTE — H&P (Signed)
HISTORY AND PHYSICAL INTERVAL NOTE:  06/25/2020  10:35 AM  Marc [name]  has presented today for surgery, with the diagnosis of M86.272 OSTEOMYELITIS LEFT FOOT WITH NONHEALING ULCERATION, PVD, DM2 WITH POLYNEUROPATHY.  The various methods of treatment have been discussed with the patient.  No guarantees were given.  After consideration of risks, benefits and other options for treatment, the patient has consented to surgery.  I have reviewed the patients' chart and labs.    PROCEDURE:  1. LEFT PARTIAL 1ST RAY AMPUTATION  A history and physical examination was performed in my office.  The patient was reexamined.  There have been no changes to this history and physical examination.  Marc [name], DPM

## 2020-06-25 NOTE — Anesthesia Preprocedure Evaluation (Signed)
Anesthesia Evaluation  Patient identified by MRN, date of birth, ID band Patient awake    Reviewed: Allergy & Precautions, H&P , NPO status , Patient's Chart, lab work & pertinent test results  History of Anesthesia Complications Negative for: history of anesthetic complications  Airway Mallampati: II  TM Distance: >3 FB     Dental  (+) Chipped   Pulmonary neg pulmonary ROS, neg sleep apnea, neg COPD, Current Smoker and Patient abstained from smoking.,    breath sounds clear to auscultation       Cardiovascular hypertension, (-) angina+ CAD, + Past MI, + CABG (2015), + Peripheral Vascular Disease and +CHF  (-) Cardiac Stents + dysrhythmias Atrial Fibrillation  Rhythm:regular Rate:Bradycardia     Neuro/Psych negative neurological ROS  negative psych ROS   GI/Hepatic negative GI ROS, Neg liver ROS,   Endo/Other  diabetes  Renal/GU      Musculoskeletal   Abdominal   Peds  Hematology negative hematology ROS (+)   Anesthesia Other Findings Past Medical History: No date: Anxiety No date: Cardiomyopathy [facility])     Comment:  45-50% No date: CHF (congestive heart failure) (HCC) 07/2013: Chronic systolic heart failure (HCC)     Comment:  Ejection fraction of 45-50% with possible inferior wall               hypokinesis No date: Coronary artery disease     Comment:  Significant left main and Severe three-vessel coronary               artery disease in December of 2014. He underwent CABG in               January of 2015 with LIMA to LAD, sequential SVG to               ramus/OM 2 and SVG to OM 3 No date: Diabetes mellitus without complication (HCC) No date: Dysrhythmia No date: Hyperlipidemia No date: Hypertension No date: Medical non-compliance No date: MI (myocardial infarction) (HCC) No date: Obesity No date: Tobacco abuse  Past Surgical History: 04/08/2020: AMPUTATION; Left     Comment:  Procedure: AMPUTATION  RAY;  Surgeon: Rosetta Posner, DPM;              Location: [facility] ORS;  Service: Podiatry;  Laterality:               Left; 02/29/2016: AMPUTATION TOE; Right     Comment:  Procedure: AMPUTATION TOE;  Surgeon: Linus Galas, DPM;                Location: [facility] ORS;  Service: Podiatry;  Laterality:               Right;  Great toe No date: CARDIAC CATHETERIZATION     Comment:  [facility] no stent 09/16/2013: CORONARY ARTERY BYPASS GRAFT; N/A     Comment:  Procedure: CORONARY ARTERY BYPASS GRAFTING (CABG);                Surgeon: [name], MD;  Location: [facility] OR;  Service:               Open Heart Surgery;  Laterality: N/A;  CABG x four, using              left internal mammary artery and right leg greater               saphenous vein 09/16/2013: INTRAOPERATIVE TRANSESOPHAGEAL ECHOCARDIOGRAM; N/A     Comment:  Procedure: INTRAOPERATIVE TRANSESOPHAGEAL               ECHOCARDIOGRAM;  Surgeon: [name], MD;  Location:               [facility] OR;  Service: Open Heart Surgery;  Laterality: N/A; No date: knee surgery; Right No date: LASIK; Bilateral 04/07/2020: LOWER EXTREMITY ANGIOGRAPHY; Left     Comment:  Procedure: Lower Extremity Angiography;  Surgeon:               Renford Dills, MD;  Location: [facility] INVASIVE CV LAB;               Service: Cardiovascular;  Laterality: Left; 04/09/2020: LOWER EXTREMITY ANGIOGRAPHY; Left     Comment:  Procedure: Lower Extremity Angiography (Pedal Approach);              Surgeon: Renford Dills, MD;  Location: [facility] INVASIVE              CV LAB;  Service: Cardiovascular;  Laterality: Left;  BMI    Body Mass Index: 32.87 kg/m      Reproductive/Obstetrics negative OB ROS                            Anesthesia Physical Anesthesia Plan  ASA: III  Anesthesia Plan: General LMA   Post-op Pain Management:    Induction:   PONV Risk Score and Plan: Dexamethasone, Ondansetron and Treatment may vary due to age or medical  condition  Airway Management Planned:   Additional Equipment:   Intra-op Plan:   Post-operative Plan:   Informed Consent: I have reviewed the patients History and Physical, chart, labs and discussed the procedure including the risks, benefits and alternatives for the proposed anesthesia with the patient or authorized representative who has indicated his/her understanding and acceptance.     Dental Advisory Given  Plan Discussed with: Anesthesiologist, CRNA and Surgeon  Anesthesia Plan Comments:        Anesthesia Quick Evaluation

## 2020-06-25 NOTE — Discharge Instructions (Addendum)
[facility]  POST OPERATIVE INSTRUCTIONS FOR DR. TROXLER, DR. Ether Griffins, AND [facility]   1. Take your medication as prescribed.  Pain medication should be taken only as needed.  Continue taking keflex antibiotics until gone that we were given a few days ago.  2. Keep the dressing clean, dry and intact.  Recommend partial weightbearing with heel contact in surgical shoe to the left foot and try to stay off of the left foot as much as possible to ensure proper incision healing.  Also continue smoking cessation, smoking/nicotine/tobacco use can cause your incision site to not heal properly.  3. Keep your foot elevated above the heart level for the first 48 hours.  4. Walking to the bathroom and brief periods of walking are acceptable, unless we have instructed you to be non-weight bearing.  5. Always wear your post-op shoe when walking.  Always use your crutches if you are to be non-weight bearing.  6. Do not take a shower. Baths are permissible as long as the foot is kept out of the water.   7. Every hour you are awake:  - Bend your knee 15 times. - Flex foot 15 times - Massage calf 15 times  8. Call [facility] [phone]) if any of the following problems occur: - You develop a temperature or fever. - The bandage becomes saturated with blood. - Medication does not stop your pain. - Injury of the foot occurs. - Any symptoms of infection including redness, odor, or red streaks running from wound.   AMBULATORY SURGERY  DISCHARGE INSTRUCTIONS   1) The drugs that you were given will stay in your system until tomorrow so for the next 24 hours you should not:  A) Drive an automobile B) Make any legal decisions C) Drink any alcoholic beverage   2) You may resume regular meals tomorrow.  Today it is better to start with liquids and gradually work up to solid foods.  You may eat anything you prefer,  but it is better to start with liquids, then soup and crackers, and gradually work up to solid foods.   3) Please notify your doctor immediately if you have any unusual bleeding, trouble breathing, redness and pain at the surgery site, drainage, fever, or pain not relieved by medication.    4) Additional Instructions:        Please contact your physician with any problems or Same Day Surgery at [phone], Monday through Friday 6 am to 4 pm, or [place] at [facility] number at [phone].

## 2020-06-25 NOTE — Op Note (Signed)
PODIATRY / FOOT AND ANKLE SURGERY OPERATIVE REPORT    SURGEON: Caroline More, DPM  PRE-OPERATIVE DIAGNOSIS:  1.  Osteomyelitis left first metatarsal 2.  Nonhealing ulceration to the left first metatarsal phalangeal joint area 3.  Diabetes type 2 polyneuropathy 4.  Peripheral vascular disease  POST-OPERATIVE DIAGNOSIS: Same  PROCEDURE(S): 1. Left partial first ray amputation  HEMOSTASIS: Ankle left tourniquet, 14 minutes  ANESTHESIA: MAC  ESTIMATED BLOOD LOSS: 10 cc  FINDING(S): 1.  Soft osteomyelitic changes seen to the medial distal aspect of the first metatarsal head left foot.  No abscess.  PATHOLOGY/SPECIMEN(S): Left first metatarsal head and neck with proximal margin marked in purple ink, sesamoids  INDICATIONS:   Marc [name] is a 59 y.o. male who presents with a nonhealing ulceration status post amputation of the left hallux due to gangrene and osteomyelitis.  Patient subsequently had an amputation of his left hallux due to gangrenous changes as well as osteomyelitis.  Patient went on to have some wound dehiscence to the medial aspect of the incision site which dehisced down to bone.  Patient now has had confirmed osteomyelitis by bone biopsy as well as x-ray.  Patient presents today for further evaluation and management and left partial first ray amputation.  Patient has been seen by vascular surgery recently who states that he should have enough blood flow to heal the procedure.  Patient continues to smoke despite instructions to stop smoking at all times.  Patient also has a history of diabetes and polyneuropathy..  DESCRIPTION: After obtaining full informed written consent, the patient was brought back to the operating room and placed supine upon the operating table.  The patient received IV antibiotics prior to induction.  After obtaining adequate anesthesia, 20 cc of half percent Marcaine plain was injected about the first ray in a Mayo type block left foot.  The  patient was prepped and draped in the standard fashion..  A 3-1 type of elliptical incision was made around the left distal first metatarsal ulceration and continued distally as well as medially/proximally up the first metatarsal to the midshaft level.  The incision was made straight to bone and the skin was ellipsed around the ulcerative site and passed off the operative site.  Circumferential dissection was then performed around the first metatarsal head and neck to the distal shaft.  A decent amount of bleeding occurred during this time so it was decided at this time to perform more hemostasis control with tourniquet.  An Esmarch bandage was used to exsanguinate the left lower extremity and the pneumatic ankle tourniquet was inflated to 250 mmHg.  After further circumferential dissection was performed around the first metatarsal head and neck and distal shaft a sagittal bone saw was then used to resect the first metatarsal with the appropriate beveling leaving more dorsal and lateral than plantar medial.  The first metatarsal head was then passed off the operative site and sent off for pathology with the proximal margin marked in purple ink.  The first metatarsal head appeared to have some dusky changes present to the medial distal aspect of the articular cartilage which appeared to be eroded and soft consistent with signs evident of acute osteomyelitis.  The sesamoids were also resected at that time passed off the operative site and the extensor and flexor tendons were cut as far proximally as possible.  The surgical site was then flushed with copious amounts normal sterile saline.  The deep subcutaneous and deep tissue structures then reapproximated well coapted with  3-0 Vicryl.  The skin was then reapproximated well coapted with combination of horizontal mattress and simple type stitching with 3-0 nylon.  A postoperative dressing was then applied after the pneumatic ankle tourniquet was deflated and prompt  hyperemic response was noted to all digits of the left foot consisting of Xeroform to the incision line followed by 4 x 4 gauze, Kerlix, Ace wrap.  The patient tolerated the procedure and anesthesia well was transferred to recovery room vital signs stable vascular status intact all toes left foot.  The patient will be discharged home with the appropriate follow-up instructions as well as medications that have been sent to the pharmacy and discharge orders.  Patient to follow-up in clinic within 1 week of surgical date.  Patient keep dressings clean, dry, and intact until that time walk with partial weightbearing with heel contact in a surgical shoe but try to stay off the left foot as much possible.  Patient also instructed on smoking cessation prior to procedure.  Discussed with son Marc [name] after procedure as well.  COMPLICATIONS: None  CONDITION: Good, stable  Caroline More, DPM

## 2020-06-25 NOTE — Anesthesia Procedure Notes (Signed)
Procedure Name: LMA Insertion Date/Time: 06/25/2020 11:22 AM Performed by: Darliss Cheney, CRNA Pre-anesthesia Checklist: Patient identified, Emergency Drugs available, Suction available and Patient being monitored Patient Re-evaluated:Patient Re-evaluated prior to induction Oxygen Delivery Method: Circle system utilized Preoxygenation: Pre-oxygenation with 100% oxygen Induction Type: IV induction Ventilation: Mask ventilation without difficulty LMA Size: 5.0 Tube type: Oral Number of attempts: 1 Airway Equipment and Method: Stylet and Oral airway Placement Confirmation: ETT inserted through vocal cords under direct vision,  positive ETCO2 and breath sounds checked- equal and bilateral Tube secured with: Tape Dental Injury: Teeth and Oropharynx as per pre-operative assessment

## 2020-06-25 NOTE — Transfer of Care (Signed)
Immediate Anesthesia Transfer of Care Note  Patient: Marc [name]  Procedure(s) Performed: AMPUTATION RAY 1ST RAY PARTIAL LEFT (Left )  Patient Location: PACU  Anesthesia Type:General  Level of Consciousness: awake  Airway & Oxygen Therapy: Patient Spontanous Breathing and Patient connected to face mask oxygen  Post-op Assessment: Report given to RN and Post -op Vital signs reviewed and stable  Post vital signs: Reviewed and stable  Last Vitals:  Vitals Value Taken Time  BP 137/77 06/25/20 1203  Temp    Pulse 64 06/25/20 1206  Resp 19 06/25/20 1206  SpO2 99 % 06/25/20 1206  Vitals shown include unvalidated device data.  Last Pain:  Vitals:   06/25/20 1004  TempSrc: Temporal  PainSc: 0-No pain         Complications: No complications documented.

## 2020-06-28 ENCOUNTER — Encounter: Payer: Self-pay | Admitting: Podiatry

## 2020-06-28 NOTE — Anesthesia Postprocedure Evaluation (Signed)
Anesthesia Post Note  Patient: Marc [name]  Procedure(s) Performed: AMPUTATION RAY 1ST RAY PARTIAL LEFT (Left )  Patient location during evaluation: PACU Anesthesia Type: General Level of consciousness: awake and alert Pain management: pain level controlled Vital Signs Assessment: post-procedure vital signs reviewed and stable Respiratory status: spontaneous breathing, nonlabored ventilation and respiratory function stable Cardiovascular status: blood pressure returned to baseline and stable Postop Assessment: no apparent nausea or vomiting Anesthetic complications: no   No complications documented.   Last Vitals:  Vitals:   06/25/20 1248 06/25/20 1259  BP: (!) 147/69 (!) 159/73  Pulse: 67 69  Resp: 13 14  Temp: 36.5 C (!) 36.2 C  SpO2: 96% 98%    Last Pain:  Vitals:   06/26/20 1326  TempSrc:   PainSc: 0-No pain                 Aurelio Brash Diesel Lina

## 2020-06-29 ENCOUNTER — Telehealth: Payer: Self-pay | Admitting: *Deleted

## 2020-06-29 ENCOUNTER — Ambulatory Visit: Payer: Medicaid Other | Admitting: Cardiology

## 2020-06-29 LAB — SURGICAL PATHOLOGY

## 2020-06-29 NOTE — Telephone Encounter (Signed)
Left voicemail message to call back for results.  

## 2020-06-29 NOTE — Telephone Encounter (Signed)
-----   Message from Debbe Odea, MD sent at 06/23/2020  7:05 PM EDT ----- Mild to moderately reduced ejection fraction, EF 40 to 45%.  Not significantly different from prior.  Continue cardiac medications as prescribed.

## 2020-06-30 ENCOUNTER — Encounter: Payer: Self-pay | Admitting: Cardiology

## 2020-08-04 NOTE — Telephone Encounter (Signed)
Spoke to pt. Notified of results of echo. Pt verbalized understanding and no questions at this time.

## 2020-09-08 ENCOUNTER — Ambulatory Visit (INDEPENDENT_AMBULATORY_CARE_PROVIDER_SITE_OTHER): Payer: Medicaid Other | Admitting: Nurse Practitioner

## 2020-09-08 ENCOUNTER — Encounter (INDEPENDENT_AMBULATORY_CARE_PROVIDER_SITE_OTHER): Payer: Medicaid Other

## 2020-10-05 ENCOUNTER — Other Ambulatory Visit (INDEPENDENT_AMBULATORY_CARE_PROVIDER_SITE_OTHER): Payer: Medicaid Other

## 2020-10-05 ENCOUNTER — Ambulatory Visit (INDEPENDENT_AMBULATORY_CARE_PROVIDER_SITE_OTHER): Payer: Medicaid Other | Admitting: Vascular Surgery

## 2020-11-20 ENCOUNTER — Inpatient Hospital Stay
Admission: EM | Admit: 2020-11-20 | Discharge: 2020-11-22 | DRG: 641 | Disposition: A | Discharge status: Home / Self Care | Payer: Medicaid Other | Attending: Hospitalist | Admitting: Hospitalist

## 2020-11-20 ENCOUNTER — Other Ambulatory Visit: Payer: Self-pay

## 2020-11-20 ENCOUNTER — Encounter: Payer: Self-pay | Admitting: Emergency Medicine

## 2020-11-20 DIAGNOSIS — I7025 Atherosclerosis of native arteries of other extremities with ulceration: Secondary | ICD-10-CM | POA: Diagnosis present

## 2020-11-20 DIAGNOSIS — I5023 Acute on chronic systolic (congestive) heart failure: Secondary | ICD-10-CM | POA: Diagnosis present

## 2020-11-20 DIAGNOSIS — I5022 Chronic systolic (congestive) heart failure: Secondary | ICD-10-CM

## 2020-11-20 DIAGNOSIS — I10 Essential (primary) hypertension: Secondary | ICD-10-CM | POA: Diagnosis present

## 2020-11-20 DIAGNOSIS — I480 Paroxysmal atrial fibrillation: Secondary | ICD-10-CM | POA: Diagnosis present

## 2020-11-20 DIAGNOSIS — I429 Cardiomyopathy, unspecified: Secondary | ICD-10-CM | POA: Diagnosis present

## 2020-11-20 DIAGNOSIS — F17210 Nicotine dependence, cigarettes, uncomplicated: Secondary | ICD-10-CM | POA: Diagnosis present

## 2020-11-20 DIAGNOSIS — Z794 Long term (current) use of insulin: Secondary | ICD-10-CM | POA: Diagnosis not present

## 2020-11-20 DIAGNOSIS — Z8249 Family history of ischemic heart disease and other diseases of the circulatory system: Secondary | ICD-10-CM | POA: Diagnosis not present

## 2020-11-20 DIAGNOSIS — E875 Hyperkalemia: Secondary | ICD-10-CM | POA: Diagnosis present

## 2020-11-20 DIAGNOSIS — I110 Hypertensive heart disease with heart failure: Secondary | ICD-10-CM | POA: Diagnosis present

## 2020-11-20 DIAGNOSIS — E1151 Type 2 diabetes mellitus with diabetic peripheral angiopathy without gangrene: Secondary | ICD-10-CM | POA: Diagnosis present

## 2020-11-20 DIAGNOSIS — R531 Weakness: Secondary | ICD-10-CM

## 2020-11-20 DIAGNOSIS — Z7982 Long term (current) use of aspirin: Secondary | ICD-10-CM | POA: Diagnosis not present

## 2020-11-20 DIAGNOSIS — Z79899 Other long term (current) drug therapy: Secondary | ICD-10-CM

## 2020-11-20 DIAGNOSIS — I5042 Chronic combined systolic (congestive) and diastolic (congestive) heart failure: Secondary | ICD-10-CM | POA: Diagnosis present

## 2020-11-20 DIAGNOSIS — I2510 Atherosclerotic heart disease of native coronary artery without angina pectoris: Secondary | ICD-10-CM | POA: Diagnosis present

## 2020-11-20 DIAGNOSIS — E871 Hypo-osmolality and hyponatremia: Secondary | ICD-10-CM | POA: Diagnosis present

## 2020-11-20 DIAGNOSIS — I719 Aortic aneurysm of unspecified site, without rupture: Secondary | ICD-10-CM | POA: Diagnosis present

## 2020-11-20 DIAGNOSIS — I252 Old myocardial infarction: Secondary | ICD-10-CM | POA: Diagnosis not present

## 2020-11-20 DIAGNOSIS — Z951 Presence of aortocoronary bypass graft: Secondary | ICD-10-CM

## 2020-11-20 DIAGNOSIS — Z7984 Long term (current) use of oral hypoglycemic drugs: Secondary | ICD-10-CM | POA: Diagnosis not present

## 2020-11-20 DIAGNOSIS — Z20822 Contact with and (suspected) exposure to covid-19: Secondary | ICD-10-CM | POA: Diagnosis present

## 2020-11-20 DIAGNOSIS — I7100 Dissection of unspecified site of aorta: Secondary | ICD-10-CM | POA: Diagnosis present

## 2020-11-20 DIAGNOSIS — E119 Type 2 diabetes mellitus without complications: Secondary | ICD-10-CM

## 2020-11-20 DIAGNOSIS — E785 Hyperlipidemia, unspecified: Secondary | ICD-10-CM | POA: Diagnosis present

## 2020-11-20 LAB — URINALYSIS, COMPLETE (UACMP) WITH MICROSCOPIC
Bilirubin Urine: NEGATIVE
Glucose, UA: NEGATIVE mg/dL
Hgb urine dipstick: NEGATIVE
Ketones, ur: NEGATIVE mg/dL
Leukocytes,Ua: NEGATIVE
Nitrite: NEGATIVE
Protein, ur: NEGATIVE mg/dL
Specific Gravity, Urine: 1.006 (ref 1.005–1.030)
Squamous Epithelial / HPF: NONE SEEN (ref 0–5)
pH: 7 (ref 5.0–8.0)

## 2020-11-20 LAB — BASIC METABOLIC PANEL
Anion gap: 7 (ref 5–15)
Anion gap: 6 (ref 5–15)
Anion gap: 6 (ref 5–15)
BUN: 45 mg/dL — ABNORMAL HIGH (ref 8–23)
BUN: 43 mg/dL — ABNORMAL HIGH (ref 8–23)
BUN: 44 mg/dL — ABNORMAL HIGH (ref 8–23)
CO2: 17 mmol/L — ABNORMAL LOW (ref 22–32)
CO2: 21 mmol/L — ABNORMAL LOW (ref 22–32)
CO2: 21 mmol/L — ABNORMAL LOW (ref 22–32)
Calcium: 10.2 mg/dL (ref 8.9–10.3)
Calcium: 10.9 mg/dL — ABNORMAL HIGH (ref 8.9–10.3)
Calcium: 10.8 mg/dL — ABNORMAL HIGH (ref 8.9–10.3)
Chloride: 104 mmol/L (ref 98–111)
Chloride: 105 mmol/L (ref 98–111)
Chloride: 103 mmol/L (ref 98–111)
Creatinine, Ser: 2.12 mg/dL — ABNORMAL HIGH (ref 0.61–1.24)
Creatinine, Ser: 1.96 mg/dL — ABNORMAL HIGH (ref 0.61–1.24)
Creatinine, Ser: 2 mg/dL — ABNORMAL HIGH (ref 0.61–1.24)
GFR, Estimated: 35 mL/min — ABNORMAL LOW (ref >60)
GFR, Estimated: 38 mL/min — ABNORMAL LOW (ref >60)
GFR, Estimated: 37 mL/min — ABNORMAL LOW (ref >60)
Glucose, Bld: 115 mg/dL — ABNORMAL HIGH (ref 70–99)
Glucose, Bld: 86 mg/dL (ref 70–99)
Glucose, Bld: 201 mg/dL — ABNORMAL HIGH (ref 70–99)
Potassium: >7.5 mmol/L (ref 3.5–5.1)
Potassium: 6.5 mmol/L (ref 3.5–5.1)
Potassium: 6.9 mmol/L (ref 3.5–5.1)
Sodium: 128 mmol/L — ABNORMAL LOW (ref 135–145)
Sodium: 132 mmol/L — ABNORMAL LOW (ref 135–145)
Sodium: 130 mmol/L — ABNORMAL LOW (ref 135–145)

## 2020-11-20 LAB — CBC
HCT: 40.3 % (ref 39.0–52.0)
Hemoglobin: 13.5 g/dL (ref 13.0–17.0)
MCH: 32.1 pg (ref 26.0–34.0)
MCHC: 33.5 g/dL (ref 30.0–36.0)
MCV: 96 fL (ref 80.0–100.0)
Platelets: 287 10*3/uL (ref 150–400)
RBC: 4.2 MIL/uL — ABNORMAL LOW (ref 4.22–5.81)
RDW: 12.6 % (ref 11.5–15.5)
WBC: 9.4 10*3/uL (ref 4.0–10.5)
nRBC: 0 % (ref 0.0–0.2)

## 2020-11-20 LAB — RESP PANEL BY RT-PCR (FLU A&B, COVID) ARPGX2
Influenza A by PCR: NEGATIVE
Influenza B by PCR: NEGATIVE
SARS Coronavirus 2 by RT PCR: NEGATIVE

## 2020-11-20 LAB — CK: Total CK: 108 U/L (ref 49–397)

## 2020-11-20 LAB — CBG MONITORING, ED: Glucose-Capillary: 80 mg/dL (ref 70–99)

## 2020-11-20 LAB — LACTIC ACID, PLASMA: Lactic Acid, Venous: 1.3 mmol/L (ref 0.5–1.9)

## 2020-11-20 LAB — GLUCOSE, CAPILLARY: Glucose-Capillary: 190 mg/dL — ABNORMAL HIGH (ref 70–99)

## 2020-11-20 MED ORDER — SODIUM ZIRCONIUM CYCLOSILICATE 10 G PO PACK
10.0000 g | PACK | Freq: Every day | ORAL | Status: DC
  Administered 2020-11-20 – 2020-11-21 (×2): 10 g via ORAL
  Filled 2020-11-20 (×2): qty 1

## 2020-11-20 MED ORDER — PATIROMER SORBITEX CALCIUM 8.4 G PO PACK
8.4000 g | PACK | Freq: Every day | ORAL | Status: DC
  Administered 2020-11-20: 8.4 g via ORAL
  Filled 2020-11-20 (×2): qty 1

## 2020-11-20 MED ORDER — SODIUM CHLORIDE 0.9 % IV BOLUS: 1000.0000 mL | Freq: Once | INTRAVENOUS | Status: DC

## 2020-11-20 MED ORDER — SODIUM CHLORIDE 0.9% FLUSH
3.0000 mL | Freq: Two times a day (BID) | INTRAVENOUS | Status: DC
  Administered 2020-11-20 – 2020-11-22 (×4): 3 mL via INTRAVENOUS

## 2020-11-20 MED ORDER — INSULIN ASPART 100 UNIT/ML IV SOLN
5.0000 [IU] | Freq: Once | INTRAVENOUS | Status: DC
  Filled 2020-11-20: qty 0.05

## 2020-11-20 MED ORDER — INSULIN GLARGINE 100 UNIT/ML ~~LOC~~ SOLN
30.0000 [IU] | Freq: Every day | SUBCUTANEOUS | Status: DC
  Administered 2020-11-21: 30 [IU] via SUBCUTANEOUS
  Filled 2020-11-20 (×3): qty 0.3

## 2020-11-20 MED ORDER — DEXTROSE 50 % IV SOLN
1.0000 | Freq: Once | INTRAVENOUS | Status: AC
  Administered 2020-11-20: 50 mL via INTRAVENOUS
  Filled 2020-11-20: qty 50

## 2020-11-20 MED ORDER — CARVEDILOL 6.25 MG PO TABS
12.5000 mg | ORAL_TABLET | Freq: Two times a day (BID) | ORAL | Status: DC
  Administered 2020-11-21 – 2020-11-22 (×3): 12.5 mg via ORAL
  Filled 2020-11-20 (×4): qty 2
  Filled 2020-11-20 (×3): qty 4
  Filled 2020-11-20: qty 2

## 2020-11-20 MED ORDER — CLONIDINE HCL 0.1 MG PO TABS
0.3000 mg | ORAL_TABLET | Freq: Two times a day (BID) | ORAL | Status: DC
  Administered 2020-11-21 – 2020-11-22 (×3): 0.3 mg via ORAL
  Filled 2020-11-20 (×3): qty 3

## 2020-11-20 MED ORDER — SODIUM CHLORIDE 0.9 % IV BOLUS
500.0000 mL | Freq: Once | INTRAVENOUS | Status: AC
  Administered 2020-11-20: 500 mL via INTRAVENOUS

## 2020-11-20 MED ORDER — ATORVASTATIN CALCIUM 20 MG PO TABS
80.0000 mg | ORAL_TABLET | Freq: Every evening | ORAL | Status: DC
  Administered 2020-11-21: 80 mg via ORAL
  Filled 2020-11-20: qty 4

## 2020-11-20 MED ORDER — ACETAMINOPHEN 650 MG RE SUPP: 650.0000 mg | Freq: Four times a day (QID) | RECTAL | Status: DC | PRN

## 2020-11-20 MED ORDER — CALCIUM GLUCONATE-NACL 1-0.675 GM/50ML-% IV SOLN
1.0000 g | Freq: Once | INTRAVENOUS | Status: AC
  Administered 2020-11-20: 1000 mg via INTRAVENOUS
  Filled 2020-11-20: qty 50

## 2020-11-20 MED ORDER — ONDANSETRON HCL 4 MG/2ML IJ SOLN: 4.0000 mg | Freq: Four times a day (QID) | INTRAMUSCULAR | Status: DC | PRN

## 2020-11-20 MED ORDER — ACETAMINOPHEN 325 MG PO TABS: 650.0000 mg | ORAL_TABLET | Freq: Four times a day (QID) | ORAL | Status: DC | PRN

## 2020-11-20 MED ORDER — INSULIN ASPART 100 UNIT/ML ~~LOC~~ SOLN
10.0000 [IU] | Freq: Once | SUBCUTANEOUS | Status: DC
  Filled 2020-11-20: qty 1

## 2020-11-20 MED ORDER — AMLODIPINE BESYLATE 5 MG PO TABS
10.0000 mg | ORAL_TABLET | Freq: Every day | ORAL | Status: DC
  Administered 2020-11-21 – 2020-11-22 (×2): 10 mg via ORAL
  Filled 2020-11-20 (×2): qty 2

## 2020-11-20 MED ORDER — ENOXAPARIN SODIUM 60 MG/0.6ML ~~LOC~~ SOLN
0.5000 mg/kg | SUBCUTANEOUS | Status: DC
  Administered 2020-11-21: 55 mg via SUBCUTANEOUS
  Filled 2020-11-20 (×2): qty 0.6

## 2020-11-20 MED ORDER — SODIUM CHLORIDE 0.9 % IV SOLN: INTRAVENOUS | Status: DC

## 2020-11-20 MED ORDER — POLYETHYLENE GLYCOL 3350 17 G PO PACK: 17.0000 g | PACK | Freq: Two times a day (BID) | ORAL | Status: DC

## 2020-11-20 MED ORDER — INSULIN ASPART 100 UNIT/ML ~~LOC~~ SOLN
5.0000 [IU] | Freq: Once | SUBCUTANEOUS | Status: AC
  Administered 2020-11-20: 5 [IU] via INTRAVENOUS

## 2020-11-20 MED ORDER — ONDANSETRON HCL 4 MG PO TABS: 4.0000 mg | ORAL_TABLET | Freq: Four times a day (QID) | ORAL | Status: DC | PRN

## 2020-11-20 MED ORDER — TRAZODONE HCL 50 MG PO TABS: 50.0000 mg | ORAL_TABLET | Freq: Every evening | ORAL | Status: DC | PRN

## 2020-11-20 MED ORDER — GLIPIZIDE ER 10 MG PO TB24
10.0000 mg | ORAL_TABLET | Freq: Every day | ORAL | Status: DC
  Administered 2020-11-21: 10 mg via ORAL
  Filled 2020-11-20 (×2): qty 1

## 2020-11-20 MED ORDER — ASPIRIN EC 81 MG PO TBEC
81.0000 mg | DELAYED_RELEASE_TABLET | Freq: Every day | ORAL | Status: DC
  Administered 2020-11-21 – 2020-11-22 (×2): 81 mg via ORAL
  Filled 2020-11-20 (×2): qty 1

## 2020-11-20 MED ORDER — DEXTROSE 50 % IV SOLN: 1.0000 | Freq: Once | INTRAVENOUS | Status: DC

## 2020-11-20 NOTE — H&P (Addendum)
[facility] History and Physical  NAZAIRE CORDIAL KXF:[id number] DOB: February 26, 1961 DOA: 11/20/2020  Referring physician: Dr. Fuller Plan PCP: [facility]   Chief Complaint: weakness  HPI: Marc [name] is a 60 y.o. male with hx systolic CHF, paroxysmal A. fib, CAD, diabetes, hypertension, hyperlipidemia, aortic aneurysm, PAD, who presents with general weakness.  Patient reports that he recently had a urinary tract infection, went to [facility] urology and was prescribed a course of Bactrim which she recently completed.  Over the past week or so he has felt progressively weak as if he had been working out hard and his muscles were tired.  He became so weak that he felt he could hardly move so decided to present for care.  He reports that he takes all his medications as prescribed including spironolactone.  In the ED patient vital signs notable only for bradycardia.  Initial BMP markedly abnormal with potassium above range of assay (7.5), sodium 128, creatinine 2.1, CBC and CK and lactic acid were all normal.  Initial EKG was notable for widened QRS of almost , otherwise unchanged from priors.  He was treated with D50, 5 units NovoLog, petit Armour, calcium gluconate x2, and the fluid bolus, with improvement in his K to 6.5 prior to going to the floor.  Review of Systems:  Pertinent positives and negative per HPI, all others reviewed and negative  Past Medical History:  Diagnosis Date  . Anxiety   . Cardiomyopathy (HCC)    45-50%  . CHF (congestive heart failure) (HCC)   . Chronic systolic heart failure (HCC) 07/2013   Ejection fraction of 45-50% with possible inferior wall hypokinesis  . Coronary artery disease    Significant left main and Severe three-vessel coronary artery disease in December of 2014. He underwent CABG in January of 2015 with LIMA to LAD, sequential SVG to ramus/OM 2 and SVG to OM 3  . Diabetes mellitus without complication (HCC)   . Dysrhythmia    . Hyperlipidemia   . Hypertension   . Medical non-compliance   . MI (myocardial infarction) (HCC)   . Obesity   . Tobacco abuse    Past Surgical History:  Procedure Laterality Date  . AMPUTATION Left 04/08/2020   Procedure: AMPUTATION RAY;  Surgeon: Rosetta Posner, DPM;  Location: [facility] ORS;  Service: Podiatry;  Laterality: Left;  . AMPUTATION Left 06/25/2020   Procedure: AMPUTATION RAY 1ST RAY PARTIAL LEFT;  Surgeon: Rosetta Posner, DPM;  Location: [facility] ORS;  Service: Podiatry;  Laterality: Left;  . AMPUTATION TOE Right 02/29/2016   Procedure: AMPUTATION TOE;  Surgeon: Linus Galas, DPM;  Location: [facility] ORS;  Service: Podiatry;  Laterality: Right;  Great toe  . CARDIAC CATHETERIZATION     [facility] no stent  . CORONARY ARTERY BYPASS GRAFT N/A 09/16/2013   Procedure: CORONARY ARTERY BYPASS GRAFTING (CABG);  Surgeon: [name], MD;  Location: [facility] OR;  Service: Open Heart Surgery;  Laterality: N/A;  CABG x four, using left internal mammary artery and right leg greater saphenous vein  . INTRAOPERATIVE TRANSESOPHAGEAL ECHOCARDIOGRAM N/A 09/16/2013   Procedure: INTRAOPERATIVE TRANSESOPHAGEAL ECHOCARDIOGRAM;  Surgeon: [name], MD;  Location: [facility] OR;  Service: Open Heart Surgery;  Laterality: N/A;  . knee surgery Right   . LASIK Bilateral   . LOWER EXTREMITY ANGIOGRAPHY Left 04/07/2020   Procedure: Lower Extremity Angiography;  Surgeon: Renford Dills, MD;  Location: [facility] INVASIVE CV LAB;  Service: Cardiovascular;  Laterality: Left;  . LOWER EXTREMITY ANGIOGRAPHY Left  04/09/2020   Procedure: Lower Extremity Angiography (Pedal Approach);  Surgeon: Renford Dills, MD;  Location: [facility] INVASIVE CV LAB;  Service: Cardiovascular;  Laterality: Left;   Social History:  reports that he has been smoking cigarettes. He has a 22.50 pack-year smoking history. He has never used smokeless tobacco. He reports that he does not drink alcohol and does not use drugs.  No Known Allergies  Family History   Problem Relation Age of Onset  . Heart disease Mother      Prior to Admission medications   Medication Sig Start Date End Date Taking? Authorizing Provider  amLODipine (NORVASC) 10 MG tablet Take 10 mg by mouth daily. for high blood pressure 01/31/18   [provider]  aspirin EC 81 MG tablet Take 81 mg by mouth daily. Swallow whole.    [provider]  atorvastatin (LIPITOR) 80 MG tablet Take 80 mg by mouth every evening.  01/31/18   [provider]  calcium carbonate (TUMS - DOSED IN MG ELEMENTAL CALCIUM) 500 MG chewable tablet Chew 1 tablet by mouth daily as needed for indigestion or heartburn.    [provider]  carvedilol (COREG) 25 MG tablet Take 0.5 tablets (12.5 mg total) by mouth 2 (two) times daily. 04/10/20   Briant Cedar, MD  cloNIDine (CATAPRES) 0.3 MG tablet Take 0.3 mg by mouth 2 (two) times daily.  01/24/18   [provider]  glipiZIDE (GLUCOTROL XL) 10 MG 24 hr tablet Take 10 mg by mouth daily.  01/31/18   [provider]  insulin glargine (LANTUS SOLOSTAR) 100 UNIT/ML Solostar Pen Inject 65 Units into the skin daily.    [provider]  lisinopril (ZESTRIL) 40 MG tablet Take 40 mg by mouth daily.    [provider]  spironolactone (ALDACTONE) 50 MG tablet Take 50 mg by mouth 2 (two) times daily.  05/20/20   [provider]   Physical Exam: Vitals:   11/20/20 1800 11/20/20 1830 11/20/20 1900 11/20/20 2017  BP: 113/63 134/79 (!) 139/118 132/61  Pulse:  (!) 53 (!) 54 60  Resp: (!) 24 14 17 14   Temp:      TempSrc:      SpO2:  100% 100% 97%  Weight:      Height:        Wt Readings from Last 3 Encounters:  11/20/20 111.1 kg  06/25/20 113 kg  06/21/20 112.9 kg     . General:  Appears calm and comfortable . Eyes: PERRL, normal lids, irises & conjunctiva . ENT: grossly normal hearing, lips & tongue . Neck: no masses . Cardiovascular: RRR, no m/r/g. No LE edema. . Telemetry:  Sinus . Respiratory: CTA bilaterally, no w/r/r. Normal respiratory effort. . Abdomen: soft, ntnd . Skin: no rash or induration seen on limited exam . Musculoskeletal: grossly normal tone BUE/BLE . Psychiatric: grossly normal mood and affect, speech fluent and appropriate . Neurologic: grossly non-focal.          Labs on Admission:  Basic Metabolic Panel: Recent Labs  Lab 11/20/20 1419 11/20/20 1854  NA 128* 132*  K >7.5* 6.5*  CL 104 105  CO2 17* 21*  GLUCOSE 115* 86  BUN 45* 43*  CREATININE 2.12* 1.96*  CALCIUM 10.2 10.9*   Liver Function Tests: No results for input(s): AST, ALT, ALKPHOS, BILITOT, PROT, ALBUMIN in the last 168 hours. No results for input(s): LIPASE, AMYLASE in the last 168 hours. No results for input(s): AMMONIA in the last 168  hours. CBC: Recent Labs  Lab 11/20/20 1419  WBC 9.4  HGB 13.5  HCT 40.3  MCV 96.0  PLT 287   Cardiac Enzymes: Recent Labs  Lab 11/20/20 1821  CKTOTAL 108    BNP (last 3 results) No results for input(s): BNP in the last 8760 hours.  ProBNP (last 3 results) No results for input(s): PROBNP in the last 8760 hours.  CBG: Recent Labs  Lab 11/20/20 2003  GLUCAP 80    Radiological Exams on Admission: No results found.  EKG: Independently reviewed. Sinus, widened QRS.  Assessment/Plan Active Problems:   Chronic systolic heart failure (HCC)   Hypertension   Hyperlipidemia   PAF (paroxysmal atrial fibrillation) (HCC)   CAD (coronary artery disease)   Diabetes (HCC)   Aortic aneurysm with dissection (HCC)   Atherosclerosis of native arteries of the extremities with ulceration (HCC)   Hyperkalemia   #Hyperkalemia Appears to be due to combination of Bactrim with potassium sparing diuretics including lisinopril and spironolactone.  We will hold these diuretics for time being.  Initial potassium level above assay on arrival, after treatment in the ED has improved to 6.5.  Will continue to check regularly and  provide potassium reduction medications. -Check BMP every 6 hours -We will give additional bolus of D50 and 5 units insulin -Normal saline at 150 cc an hour -Continuous telemetry -Lokelma -MiraLAX twice daily  #Known medical problems Hypertension-continue amlodipine, clonidine, carvedilol.  Hold lisinopril and spironolactone CAD-continue aspirin, atorvastatin DM2-hold glipizide, reduce home Lantus from 65 units to 30  Code Status: Full Code, confirmed DVT Prophylaxis: Lovenox Family Communication: None Disposition Plan: Inpatient, Med-Surg   Time spent: 57 min  Venora Maples MD/[facility]  Note:  This document was prepared using Conservation officer, historic buildings and may include unintentional dictation errors.

## 2020-11-20 NOTE — ED Notes (Signed)
Pt placed on antibiotics for the kidney infection for the last 9 to 10 days

## 2020-11-20 NOTE — Progress Notes (Signed)
Anticoagulation monitoring(Lovenox):  60 yo male ordered Lovenox 40 mg Q24h  Filed Weights   11/20/20 1420  Weight: 111.1 kg (245 lb)   BMI 32.32   Lab Results  Component Value Date   CREATININE 2.00 (H) 11/20/2020   CREATININE 1.96 (H) 11/20/2020   CREATININE 2.12 (H) 11/20/2020   Estimated Creatinine Clearance: 50.7 mL/min (A) (by C-G formula based on SCr of 2 mg/dL (H)). Hemoglobin & Hematocrit     Component Value Date/Time   HGB 13.5 11/20/2020 1419   HGB 18.7 (H) 09/18/2014 1835   HCT 40.3 11/20/2020 1419   HCT 56.1 (H) 09/18/2014 1835     Per Protocol for Patient with estCrcl > 30 ml/min and BMI > 30, will transition to Lovenox 55 mg Q24h.

## 2020-11-20 NOTE — ED Triage Notes (Signed)
Pt in via [facility] from home with complaints of generalized weakness, states he is unable to stand on his own today.  Pt is currently being treated for kidney infection, states he took them for 8 days and then quit taking them.  States he is unaware if weakness is associated with antibiotics.  Reports similar episode yesterday where he had sudden onset weakness/fatigue x approximately 10 minutes.  Denies any other complaints.  Vitals WDL, NAD noted.

## 2020-11-20 NOTE — ED Triage Notes (Signed)
Pt in via [facility] from home with c/o weakness that started yesterday and resolved. Pt was running errands this am was able to drive himself home but could not get out of the car. 122/92, HR 55, 100% Ra, FSBS 125, 98.0 temp. Pt also reports some discomfort with jaw. Pt also being treated currently for kidney infection.

## 2020-11-20 NOTE — ED Notes (Signed)
Pt reports 'my bs is low,' bs 37 'I normally run over 120.' provider okayed for pt to eat. [place] sandwich and drink provided.

## 2020-11-20 NOTE — ED Provider Notes (Signed)
[facility] Emergency Department Provider Note  ____________________________________________  Time seen: Approximately 4:46 PM  I have reviewed the triage vital signs and the nursing notes.   HISTORY  Chief Complaint Weakness    HPI Marc [name] is a 60 y.o. male who presents the emergency department complaining of generalized weakness.  Patient states that yesterday he had a period of weakness that lasted approximately 10 minutes.  This was after he had gone to the store and been walking around.  Patient states that it was briefly, he returned to his normal baseline and had no issues until today.  Patient states that he went to the grocery store, had been walking around for some time getting his groceries without difficulty.  Patient started to have significant peripheral generalized weakness.  He states that he was so weak that he did not feel like he could lift his extremities.  This has improved after he is been resting.   Patient states that currently he has a generalized ache which he would describe as "if I had worked out what I would feel."  Patient denies any headache, visual changes, fevers or chills, nasal congestion, sore throat, cough, chest pain, shortness of breath, abdominal pain, nausea vomiting, diarrhea or constipation.  Patient states that he just completed 8 days of Bactrim for urinary tract infection.  He has no dysuria, polyuria or hematuria currently.  Patient has a history of anxiety, cardiomyopathy, CHF, CAD, diabetes, hypertension, paroxysmal A. fib.  Previous MI.        Past Medical History:  Diagnosis Date  . Anxiety   . Cardiomyopathy (HCC)    45-50%  . CHF (congestive heart failure) (HCC)   . Chronic systolic heart failure (HCC) 07/2013   Ejection fraction of 45-50% with possible inferior wall hypokinesis  . Coronary artery disease    Significant left main and Severe three-vessel coronary artery disease in December of 2014. He  underwent CABG in January of 2015 with LIMA to LAD, sequential SVG to ramus/OM 2 and SVG to OM 3  . Diabetes mellitus without complication (HCC)   . Dysrhythmia   . Hyperlipidemia   . Hypertension   . Medical non-compliance   . MI (myocardial infarction) (HCC)   . Obesity   . Tobacco abuse     Patient Active Problem List   Diagnosis Date Noted  . Hyperkalemia 11/20/2020  . Cellulitis and abscess of toe of left foot 04/05/2020  . Diabetes (HCC) 03/14/2018  . Blurred vision 03/13/2018  . Cellulitis of toe, right 02/29/2016  . Atherosclerosis of native arteries of the extremities with ulceration (HCC) 10/21/2014  . Acute ischemic colitis (HCC) 09/19/2014  . Aortic aneurysm with dissection (HCC) 09/19/2014  . Type 2 diabetes mellitus without complication (HCC) 09/19/2014  . Encounter for therapeutic drug monitoring 09/26/2013  . CAD (coronary artery disease) 09/16/2013  . Chronic coronary artery disease 09/16/2013  . Coronary artery disease   . PAF (paroxysmal atrial fibrillation) (HCC) 07/29/2013  . Atrial fibrillation (HCC) 07/29/2013  . Hypertension   . Hyperlipidemia   . Chronic systolic heart failure (HCC) 07/05/2013    Past Surgical History:  Procedure Laterality Date  . AMPUTATION Left 04/08/2020   Procedure: AMPUTATION RAY;  Surgeon: Rosetta Posner, DPM;  Location: [facility] ORS;  Service: Podiatry;  Laterality: Left;  . AMPUTATION Left 06/25/2020   Procedure: AMPUTATION RAY 1ST RAY PARTIAL LEFT;  Surgeon: Rosetta Posner, DPM;  Location: [facility] ORS;  Service: Podiatry;  Laterality: Left;  .  AMPUTATION TOE Right 02/29/2016   Procedure: AMPUTATION TOE;  Surgeon: Linus Galas, DPM;  Location: [facility] ORS;  Service: Podiatry;  Laterality: Right;  Great toe  . CARDIAC CATHETERIZATION     [facility] no stent  . CORONARY ARTERY BYPASS GRAFT N/A 09/16/2013   Procedure: CORONARY ARTERY BYPASS GRAFTING (CABG);  Surgeon: [name], MD;  [facility];  Service: Open Heart Surgery;   Laterality: N/A;  CABG x four, using left internal mammary artery and right leg greater saphenous vein  . INTRAOPERATIVE TRANSESOPHAGEAL ECHOCARDIOGRAM N/A 09/16/2013   Procedure: INTRAOPERATIVE TRANSESOPHAGEAL ECHOCARDIOGRAM;  Surgeon: [name], MD;  Location: [facility] OR;  Service: Open Heart Surgery;  Laterality: N/A;  . knee surgery Right   . LASIK Bilateral   . LOWER EXTREMITY ANGIOGRAPHY Left 04/07/2020   Procedure: Lower Extremity Angiography;  Surgeon: Renford Dills, MD;  Location: [facility] INVASIVE CV LAB;  Service: Cardiovascular;  Laterality: Left;  . LOWER EXTREMITY ANGIOGRAPHY Left 04/09/2020   Procedure: Lower Extremity Angiography (Pedal Approach);  Surgeon: Renford Dills, MD;  Location: [facility] INVASIVE CV LAB;  Service: Cardiovascular;  Laterality: Left;    Prior to Admission medications   Medication Sig Start Date End Date Taking? Authorizing Provider  amLODipine (NORVASC) 10 MG tablet Take 10 mg by mouth daily. for high blood pressure 01/31/18   [provider]  aspirin EC 81 MG tablet Take 81 mg by mouth daily. Swallow whole.    [provider]  atorvastatin (LIPITOR) 80 MG tablet Take 80 mg by mouth every evening.  01/31/18   [provider]  calcium carbonate (TUMS - DOSED IN MG ELEMENTAL CALCIUM) 500 MG chewable tablet Chew 1 tablet by mouth daily as needed for indigestion or heartburn.    [provider]  carvedilol (COREG) 25 MG tablet Take 0.5 tablets (12.5 mg total) by mouth 2 (two) times daily. 04/10/20   Briant Cedar, MD  cloNIDine (CATAPRES) 0.3 MG tablet Take 0.3 mg by mouth 2 (two) times daily.  01/24/18   [provider]  glipiZIDE (GLUCOTROL XL) 10 MG 24 hr tablet Take 10 mg by mouth daily.  01/31/18   [provider]  insulin glargine (LANTUS SOLOSTAR) 100 UNIT/ML Solostar Pen Inject 65 Units into the skin daily.    [provider]  lisinopril (ZESTRIL) 40 MG tablet Take 40 mg by mouth daily.     [provider]  spironolactone (ALDACTONE) 50 MG tablet Take 50 mg by mouth 2 (two) times daily.  05/20/20   [provider]    Allergies Patient has no known allergies.  Family History  Problem Relation Age of Onset  . Heart disease Mother     Social History Social History   Tobacco Use  . Smoking status: Current Some Day Smoker    Packs/day: 0.50    Years: 45.00    Pack years: 22.50    Types: Cigarettes  . Smokeless tobacco: Never Used  . Tobacco comment: trying to quit  Vaping Use  . Vaping Use: Never used  Substance Use Topics  . Alcohol use: No    Alcohol/week: 0.0 standard drinks  . Drug use: No     Review of Systems  Constitutional: No fever/chills.  Positive for generalized weakness Eyes: No visual changes. No discharge ENT: No upper respiratory complaints. Cardiovascular: no chest pain. Respiratory: no cough. No SOB. Gastrointestinal: No abdominal pain.  No nausea, no vomiting.  No diarrhea.  No constipation. Genitourinary: Negative for  dysuria. No hematuria Musculoskeletal: Negative for musculoskeletal pain. Skin: Negative for rash, abrasions, lacerations, ecchymosis. Neurological: Negative for headaches, focal weakness or numbness.  10 System ROS otherwise negative.  ____________________________________________   PHYSICAL EXAM:  VITAL SIGNS: ED Triage Vitals  Enc Vitals Group     BP 11/20/20 1413 134/77     Pulse Rate 11/20/20 1413 (!) 56     Resp 11/20/20 1413 20     Temp 11/20/20 1413 (!) 97.5 F (36.4 C)     Temp Source 11/20/20 1413 Oral     SpO2 11/20/20 1413 99 %     Weight 11/20/20 1420 245 lb (111.1 kg)     Height 11/20/20 1420 6\' 1"  (1.854 m)     Head Circumference --      Peak Flow --      Pain Score 11/20/20 1414 0     Pain Loc --      Pain Edu? --      Excl. in GC? --      Constitutional: Alert and oriented. Well appearing and in no acute distress. Eyes: Conjunctivae are normal. PERRL. EOMI. Head:  Atraumatic. ENT:      Ears:       Nose: No congestion/rhinnorhea.      Mouth/Throat: Mucous membranes are moist.  Neck: No stridor.   Hematological/Lymphatic/Immunilogical: No cervical lymphadenopathy. Cardiovascular: Normal rate, regular rhythm. Normal S1 and S2.  Good peripheral circulation. Respiratory: Normal respiratory effort without tachypnea or retractions. Lungs CTAB. Good air entry to the bases with no decreased or absent breath sounds. Gastrointestinal: Bowel sounds 4 quadrants. Soft and nontender to palpation. No guarding or rigidity. No palpable masses. No distention. No CVA tenderness. Musculoskeletal: Full range of motion to all extremities. No gross deformities appreciated. Neurologic:  Normal speech and language. No gross focal neurologic deficits are appreciated.  Cranial nerves II through XII grossly intact. Skin:  Skin is warm, dry and intact. No rash noted. Psychiatric: Mood and affect are normal. Speech and behavior are normal. Patient exhibits appropriate insight and judgement.   ____________________________________________   LABS (all labs ordered are listed, but only abnormal results are displayed)  Labs Reviewed  BASIC METABOLIC PANEL - Abnormal; Notable for the following components:      Result Value   Sodium 128 (*)    Potassium >7.5 (*)    CO2 17 (*)    Glucose, Bld 115 (*)    BUN 45 (*)    Creatinine, Ser 2.12 (*)    GFR, Estimated 35 (*)    All other components within normal limits  CBC - Abnormal; Notable for the following components:   RBC 4.20 (*)    All other components within normal limits  URINALYSIS, COMPLETE (UACMP) WITH MICROSCOPIC - Abnormal; Notable for the following components:   Color, Urine STRAW (*)    APPearance CLEAR (*)    Bacteria, UA RARE (*)    All other components within normal limits  BASIC METABOLIC PANEL - Abnormal; Notable for the following components:   Sodium 132 (*)    Potassium 6.5 (*)    CO2 21 (*)    BUN 43  (*)    Creatinine, Ser 1.96 (*)    Calcium 10.9 (*)    GFR, Estimated 38 (*)    All other components within normal limits  RESP PANEL BY RT-PCR (FLU A&B, COVID) ARPGX2  LACTIC ACID, PLASMA  CK  CBG MONITORING, ED   ____________________________________________  EKG  ED ECG REPORT I,  Delorise Royals Riddhi Grether,  personally viewed and interpreted this ECG.   Date: 11/20/2020  EKG Time: 1409 hrs.  Rate: 57 bpm  Rhythm: unchanged from previous tracings, sinus bradycardia, RBBB, 1st degree AV block, compared to previous EKG from 06/21/2020 no significant changes.  Axis: Left axis  Intervals:first-degree A-V block   ST&T Change: Non specific changes  ____________________________________________  RADIOLOGY   No results found.  ____________________________________________    PROCEDURES  Procedure(s) performed:    Procedures    Medications  patiromer Lelon Perla) packet 8.4 g (8.4 g Oral Given 11/20/20 1656)  calcium gluconate 1 g/ 50 mL sodium chloride IVPB (0 g Intravenous Stopped 11/20/20 1820)  dextrose 50 % solution 50 mL (50 mLs Intravenous Given 11/20/20 1642)  sodium chloride 0.9 % bolus 500 mL (0 mLs Intravenous Stopped 11/20/20 1820)  insulin aspart (novoLOG) injection 5 Units (5 Units Intravenous Given 11/20/20 1631)  calcium gluconate 1 g/ 50 mL sodium chloride IVPB (0 mg Intravenous Stopped 11/20/20 2012)     ____________________________________________   INITIAL IMPRESSION / ASSESSMENT AND PLAN / ED COURSE  Pertinent labs & imaging results that were available during my care of the patient were reviewed by me and considered in my medical decision making (see chart for details).  Review of the [place] CSRS was performed in accordance of the [facility] prior to dispensing any controlled drugs.           Patient's diagnosis is consistent with hyperkalemia, hyponatremia.  Patient presented to the emergency department with generalized weakness.  He had not.  Yesterday after  walking around where he felt generally weak, this resolved within 10 minutes.  Patient had gone to the grocery store today, been walking around when he started feeling more fatigued and weak.  Patient states he became so weak he cannot lift his arms and his legs.  No headache, visual changes, neck pain or chest pain, no shortness of breath.  Patient has had no URI symptoms, recent fevers or chills.  He has completed a course of antibiotics for urinary tract infection but has no more symptoms.  Overall exam was reassuring with patient being neurologically intact.  Patient's labs revealed hyponatremia and hyperkalemia.  Patient has been started on fluids, Veltassa, calcium, insulin with D50.  Patient is only given 500 mL of saline as he has a history of CHF.  He does not appear fluid overloaded currently.  At this time patient will be admitted to the hospitalist service..     ____________________________________________  FINAL CLINICAL IMPRESSION(S) / ED DIAGNOSES  Final diagnoses:  Hyperkalemia  Hyponatremia  Weakness      NEW MEDICATIONS STARTED DURING THIS VISIT:  ED Discharge Orders    None          This chart was dictated using voice recognition software/Dragon. Despite best efforts to proofread, errors can occur which can change the meaning. Any change was purely unintentional.    Racheal Patches, PA-C 11/20/20 2023    Concha Se, MD 11/22/20 [phone]

## 2020-11-20 NOTE — ED Notes (Signed)
Pt ringing call bell. Entered pt's room to inquire about needs. Pt states "I don't need anything, she already did it". Pt sitting on strecher, bed in low and locked position, siderails up.

## 2020-11-21 ENCOUNTER — Encounter: Payer: Self-pay | Admitting: Family Medicine

## 2020-11-21 DIAGNOSIS — E875 Hyperkalemia: Secondary | ICD-10-CM | POA: Diagnosis not present

## 2020-11-21 DIAGNOSIS — I10 Essential (primary) hypertension: Secondary | ICD-10-CM

## 2020-11-21 DIAGNOSIS — I5042 Chronic combined systolic (congestive) and diastolic (congestive) heart failure: Secondary | ICD-10-CM | POA: Diagnosis not present

## 2020-11-21 LAB — BASIC METABOLIC PANEL
Anion gap: 5 (ref 5–15)
Anion gap: 5 (ref 5–15)
BUN: 37 mg/dL — ABNORMAL HIGH (ref 8–23)
BUN: 33 mg/dL — ABNORMAL HIGH (ref 8–23)
CO2: 20 mmol/L — ABNORMAL LOW (ref 22–32)
CO2: 18 mmol/L — ABNORMAL LOW (ref 22–32)
Calcium: 10.5 mg/dL — ABNORMAL HIGH (ref 8.9–10.3)
Calcium: 10 mg/dL (ref 8.9–10.3)
Chloride: 108 mmol/L (ref 98–111)
Chloride: 106 mmol/L (ref 98–111)
Creatinine, Ser: 1.63 mg/dL — ABNORMAL HIGH (ref 0.61–1.24)
Creatinine, Ser: 1.68 mg/dL — ABNORMAL HIGH (ref 0.61–1.24)
GFR, Estimated: 48 mL/min — ABNORMAL LOW (ref >60)
GFR, Estimated: 46 mL/min — ABNORMAL LOW (ref >60)
Glucose, Bld: 150 mg/dL — ABNORMAL HIGH (ref 70–99)
Glucose, Bld: 219 mg/dL — ABNORMAL HIGH (ref 70–99)
Potassium: 6 mmol/L — ABNORMAL HIGH (ref 3.5–5.1)
Potassium: 7 mmol/L (ref 3.5–5.1)
Sodium: 133 mmol/L — ABNORMAL LOW (ref 135–145)
Sodium: 129 mmol/L — ABNORMAL LOW (ref 135–145)

## 2020-11-21 LAB — POTASSIUM: Potassium: 5.2 mmol/L — ABNORMAL HIGH (ref 3.5–5.1)

## 2020-11-21 LAB — HEMOGLOBIN A1C
Hgb A1c MFr Bld: 8 % — ABNORMAL HIGH (ref 4.8–5.6)
Mean Plasma Glucose: 182.9 mg/dL

## 2020-11-21 MED ORDER — SODIUM POLYSTYRENE SULFONATE 15 GM/60ML PO SUSP
15.0000 g | Freq: Once | ORAL | Status: AC
  Administered 2020-11-21: 15 g via ORAL
  Filled 2020-11-21: qty 60

## 2020-11-21 MED ORDER — SODIUM POLYSTYRENE SULFONATE 15 GM/60ML PO SUSP
15.0000 g | Freq: Once | ORAL | Status: DC
  Filled 2020-11-21: qty 60

## 2020-11-21 MED ORDER — INSULIN ASPART 100 UNIT/ML ~~LOC~~ SOLN
0.0000 [IU] | Freq: Three times a day (TID) | SUBCUTANEOUS | Status: DC
  Administered 2020-11-22: 5 [IU] via SUBCUTANEOUS
  Filled 2020-11-21: qty 1

## 2020-11-21 NOTE — Progress Notes (Signed)
Date and time results received: 11/21/20 0956  MD notified [phone].   Test: K+ Critical Value:7.2  Name of Provider Notified: MD- Fran Lowes  Orders Received? No new orders Actions Taken? Gave scheduled medications due

## 2020-11-21 NOTE — Progress Notes (Addendum)
PROGRESS NOTE    Marc [name]  IBB:[id number] DOB: March 15, 1961 DOA: 11/20/2020 PCP: [facility]  [id number]   Assessment & Plan:   Active Problems:   Chronic systolic heart failure (HCC)   Hypertension   Hyperlipidemia   PAF (paroxysmal atrial fibrillation) (HCC)   CAD (coronary artery disease)   Diabetes (HCC)   Aortic aneurysm with dissection (HCC)   Atherosclerosis of native arteries of the extremities with ulceration (HCC)   Hyperkalemia   Marc [name] is a 60 y.o. male with hx systolic CHF, paroxysmal A. fib, CAD, diabetes, hypertension, hyperlipidemia, aortic aneurysm, PAD, who presents with general weakness.   # Hyperkalemia Appears to be due to combination of Bactrim with potassium sparing diuretics including lisinopril and spironolactone.   --Initial potassium level above assay on arrival, s/p temporizing and Lokelma. Plan: --start Kayexalate 15 g q4h until large BM. --d/c Lokelma (did not work well). --keep on tele --d/c MIVF  Hypertension --cont home amlodipine, coreg and clonidine --Hold home lisinopril and aldactone  CAD -cont home ASA and statin  DM2 -Hold home glipizide --cont Lantus at reduced 30u daily --SSI TID  Chronic combined CHF --currently stable.  Echo in Oct 2021 showed EF 40-45%, grade I diastolic --d/c MIVF to avoid fluid overload --hold home aldactone 2/2 hyperkalemia   DVT prophylaxis: Lovenox SQ Code Status: Full code  Family Communication:  Level of care: Med-Surg Dispo:   The patient is from: home Anticipated d/c is to: home Anticipated d/c date is: 1-2 days Patient currently is not medically ready to d/c due to: hyperkalemia not yet resolved   Subjective and Interval History:  Pt reported feeling a lot better, weakness improved.  No flank pain, no dysuria.  Able to void.  Normal oral intake.     Objective: Vitals:   11/21/20 0320 11/21/20 0809 11/21/20 1140 11/21/20 1541  BP: 127/80  131/60 (!) 142/71 124/67  Pulse: 60 (!) 55 60 63  Resp: 16 17 20 17   Temp: 97.9 F (36.6 C) 98.3 F (36.8 C) 97.8 F (36.6 C) 98.5 F (36.9 C)  TempSrc: Oral Oral    SpO2: 98% 98% 99% 99%  Weight:      Height:        Intake/Output Summary (Last 24 hours) at 11/21/2020 1807 Last data filed at 11/21/2020 1355 Gross per 24 hour  Intake 960 ml  Output 1901 ml  Net -941 ml   Filed Weights   11/20/20 1420  Weight: 111.1 kg    Examination:   Constitutional: NAD, AAOx3, sitting in chair eating lunch HEENT: conjunctivae and lids normal, EOMI CV: No cyanosis.   RESP: normal respiratory effort, on RA Extremities: No effusions, edema in BLE SKIN: warm, dry Neuro: II - XII grossly intact.   Psych: Normal mood and affect.  Appropriate judgement and reason   Data Reviewed: I have personally reviewed following labs and imaging studies  CBC: Recent Labs  Lab 11/20/20 1419  WBC 9.4  HGB 13.5  HCT 40.3  MCV 96.0  PLT 287   Basic Metabolic Panel: Recent Labs  Lab 11/20/20 1419 11/20/20 1854 11/20/20 2151 11/21/20 0458 11/21/20 0916  NA 128* 132* 130* 133* 129*  K >7.5* 6.5* 6.9* 6.0* 7.0*  CL 104 105 103 108 106  CO2 17* 21* 21* 20* 18*  GLUCOSE 115* 86 201* 150* 219*  BUN 45* 43* 44* 37* 33*  CREATININE 2.12* 1.96* 2.00* 1.63* 1.68*  CALCIUM 10.2 10.9* 10.8* 10.5* 10.0  GFR: Estimated Creatinine Clearance: 60.3 mL/min (A) (by C-G formula based on SCr of 1.68 mg/dL (H)). Liver Function Tests: No results for input(s): AST, ALT, ALKPHOS, BILITOT, PROT, ALBUMIN in the last 168 hours. No results for input(s): LIPASE, AMYLASE in the last 168 hours. No results for input(s): AMMONIA in the last 168 hours. Coagulation Profile: No results for input(s): INR, PROTIME in the last 168 hours. Cardiac Enzymes: Recent Labs  Lab 11/20/20 1821  CKTOTAL 108   BNP (last 3 results) No results for input(s): PROBNP in the last 8760 hours. HbA1C: No results for input(s): HGBA1C  in the last 72 hours. CBG: Recent Labs  Lab 11/20/20 2003 11/20/20 2154  GLUCAP 80 190*   Lipid Profile: No results for input(s): CHOL, HDL, LDLCALC, TRIG, CHOLHDL, LDLDIRECT in the last 72 hours. Thyroid Function Tests: No results for input(s): TSH, T4TOTAL, FREET4, T3FREE, THYROIDAB in the last 72 hours. Anemia Panel: No results for input(s): VITAMINB12, FOLATE, FERRITIN, TIBC, IRON, RETICCTPCT in the last 72 hours. Sepsis Labs: Recent Labs  Lab 11/20/20 1419  LATICACIDVEN 1.3    Recent Results (from the past 240 hour(s))  Resp Panel by RT-PCR (Flu A&B, Covid) Nasopharyngeal Swab     Status: None   Collection Time: 11/20/20  8:17 PM   Specimen: Nasopharyngeal Swab; Nasopharyngeal(NP) swabs in vial transport medium  Result Value Ref Range Status   SARS Coronavirus 2 by RT PCR NEGATIVE NEGATIVE Final    Comment: (NOTE) SARS-CoV-2 target nucleic acids are NOT DETECTED.  The SARS-CoV-2 RNA is generally detectable in upper respiratory specimens during the acute phase of infection. The lowest concentration of SARS-CoV-2 viral copies this assay can detect is 138 copies/mL. A negative result does not preclude SARS-Cov-2 infection and should not be used as the sole basis for treatment or other patient management decisions. A negative result may occur with  improper specimen collection/handling, submission of specimen other than nasopharyngeal swab, presence of viral mutation(s) within the areas targeted by this assay, and inadequate number of viral copies(<138 copies/mL). A negative result must be combined with clinical observations, patient history, and epidemiological information. The expected result is Negative.  Fact Sheet for Patients:  BloggerCourse.com  Fact Sheet for Healthcare Providers:  SeriousBroker.it  This test is no t yet approved or cleared by the [place] FDA and  has been authorized for detection and/or  diagnosis of SARS-CoV-2 by FDA under an Emergency Use Authorization (EUA). This EUA will remain  in effect (meaning this test can be used) for the duration of the COVID-19 declaration under Section 564(b)(1) of the Act, 21 U.S.C.section 360bbb-3(b)(1), unless the authorization is terminated  or revoked sooner.       Influenza A by PCR NEGATIVE NEGATIVE Final   Influenza B by PCR NEGATIVE NEGATIVE Final    Comment: (NOTE) The Xpert Xpress SARS-CoV-2/FLU/RSV plus assay is intended as an aid in the diagnosis of influenza from Nasopharyngeal swab specimens and should not be used as a sole basis for treatment. Nasal washings and aspirates are unacceptable for Xpert Xpress SARS-CoV-2/FLU/RSV testing.  Fact Sheet for Patients: BloggerCourse.com  Fact Sheet for Healthcare Providers: SeriousBroker.it  This test is not yet approved or cleared by the [place] FDA and has been authorized for detection and/or diagnosis of SARS-CoV-2 by FDA under an Emergency Use Authorization (EUA). This EUA will remain in effect (meaning this test can be used) for the duration of the COVID-19 declaration under Section 564(b)(1) of the Act, 21 U.S.C. section 360bbb-3(b)(1), unless  the authorization is terminated or revoked.  Performed at [facility], [address], [place], [place] [address]       Radiology Studies: No results found.   Scheduled Meds: . amLODipine  10 mg Oral Daily  . aspirin EC  81 mg Oral Daily  . atorvastatin  80 mg Oral QPM  . carvedilol  12.5 mg Oral BID  . cloNIDine  0.3 mg Oral BID  . insulin aspart  5 Units Intravenous Once   And  . dextrose  1 ampule Intravenous Once  . enoxaparin (LOVENOX) injection  0.5 mg/kg Subcutaneous Q24H  . glipiZIDE  10 mg Oral Daily  . insulin glargine  30 Units Subcutaneous Daily  . sodium chloride flush  3 mL Intravenous Q12H   Continuous Infusions:   LOS: 1 day      Darlin Priestly, MD [facility] If 7PM-7AM, please contact night-coverage 11/21/2020, 6:07 PM

## 2020-11-22 LAB — CBC
HCT: 38 % — ABNORMAL LOW (ref 39.0–52.0)
Hemoglobin: 12.9 g/dL — ABNORMAL LOW (ref 13.0–17.0)
MCH: 32.6 pg (ref 26.0–34.0)
MCHC: 33.9 g/dL (ref 30.0–36.0)
MCV: 96 fL (ref 80.0–100.0)
Platelets: 257 10*3/uL (ref 150–400)
RBC: 3.96 MIL/uL — ABNORMAL LOW (ref 4.22–5.81)
RDW: 12.4 % (ref 11.5–15.5)
WBC: 7 10*3/uL (ref 4.0–10.5)
nRBC: 0 % (ref 0.0–0.2)

## 2020-11-22 LAB — BASIC METABOLIC PANEL
Anion gap: 7 (ref 5–15)
BUN: 29 mg/dL — ABNORMAL HIGH (ref 8–23)
CO2: 22 mmol/L (ref 22–32)
Calcium: 9.7 mg/dL (ref 8.9–10.3)
Chloride: 105 mmol/L (ref 98–111)
Creatinine, Ser: 1.44 mg/dL — ABNORMAL HIGH (ref 0.61–1.24)
GFR, Estimated: 55 mL/min — ABNORMAL LOW (ref >60)
Glucose, Bld: 167 mg/dL — ABNORMAL HIGH (ref 70–99)
Potassium: 5 mmol/L (ref 3.5–5.1)
Sodium: 134 mmol/L — ABNORMAL LOW (ref 135–145)

## 2020-11-22 LAB — GLUCOSE, CAPILLARY
Glucose-Capillary: 158 mg/dL — ABNORMAL HIGH (ref 70–99)
Glucose-Capillary: 246 mg/dL — ABNORMAL HIGH (ref 70–99)
Glucose-Capillary: 229 mg/dL — ABNORMAL HIGH (ref 70–99)
Glucose-Capillary: 184 mg/dL — ABNORMAL HIGH (ref 70–99)
Glucose-Capillary: 197 mg/dL — ABNORMAL HIGH (ref 70–99)
Glucose-Capillary: 280 mg/dL — ABNORMAL HIGH (ref 70–99)
Glucose-Capillary: 307 mg/dL — ABNORMAL HIGH (ref 70–99)

## 2020-11-22 LAB — MAGNESIUM: Magnesium: 1.6 mg/dL — ABNORMAL LOW (ref 1.7–2.4)

## 2020-11-22 MED ORDER — LISINOPRIL 40 MG PO TABS: ORAL_TABLET | ORAL | Status: DC

## 2020-11-22 MED ORDER — CARVEDILOL 25 MG PO TABS: 25.0000 mg | ORAL_TABLET | Freq: Two times a day (BID) | ORAL | Status: DC

## 2020-11-22 MED ORDER — SPIRONOLACTONE 50 MG PO TABS: ORAL_TABLET | ORAL | Status: DC

## 2020-11-22 MED ORDER — MAGNESIUM SULFATE 2 GM/50ML IV SOLN
2.0000 g | Freq: Once | INTRAVENOUS | Status: AC
  Administered 2020-11-22: 2 g via INTRAVENOUS
  Filled 2020-11-22: qty 50

## 2020-11-22 NOTE — Discharge Summary (Signed)
Physician Discharge Summary   Marc [name]  male DOB: Jan 19, 1961  EQA:[id number]  PCP: [facility]  Admit date: 11/20/2020 Discharge date: 11/22/2020  Admitted From: home Disposition:  home CODE STATUS: Full code  Discharge Instructions    Discharge instructions   Complete by: As directed    Your potassium was severely elevated, but has normalized with treatment.  Please follow up with your outpatient doctor within 1 week after discharge to check potassium level.  And also hold your home Lisinopril and spironolactone until you see your outpatient doctor.   Dr. Darlin Priestly - -      30 Day Unplanned Readmission Risk Score   Flowsheet Row ED to Hosp-Admission (Current) from 11/20/2020 in [facility] (1A)  30 Day Unplanned Readmission Risk Score (%) 14.25 Filed at 11/22/2020 0801     This score is the patient's risk of an unplanned readmission within 30 days of being discharged (0 -100%). The score is based on dignosis, age, lab data, medications, orders, and past utilization.   Low:  0-14.9   Medium: 15-21.9   High: 22-29.9   Extreme: 30 and above         Hospital Course:  For full details, please see H&P, progress notes, consult notes and ancillary notes.  Briefly,  Marc Riviera Wilsonis a 59 y.o.malewith hxsystolic CHF, paroxysmal A. fib, CAD, diabetes, hypertension, hyperlipidemia, aortic aneurysm, PAD, who presented with general weakness.   # Hyperkalemia Appears to be due to combination of Bactrim with potassium sparing diuretics including lisinopril and spironolactone.  --Initial potassium level >7.5 on arrival.  Pt received temporizing and Lokelma on admission.  Potassium level continued to be elevated, so Kayexalate 15 g q4h x 3 doses ordered, which resulted in multiple BM's and normalization of potassium level, at 5.0 prior to discharge.  Pt was advised to follow up with PCP outpatient for potassium level check 1  week after discharge.  Weakness, resolved Weakness resolved after correction of hyperkalemia.  Hypertension --cont home amlodipine, coreg and clonidine.  home lisinopril and aldactone held pending outpatient followup.  CAD -cont home ASA and statin  DM2 home glipizide held and Lantus at reduced 30u daily while inpatient.  Pt was discharged back on home diabetic regimen.  Chronic combined CHF --currently stable.  Echo in Oct 2021 showed EF 40-45%, grade I diastolic home aldactone held 2/2 hyperkalemia until outpatient followup.   Discharge Diagnoses:  Active Problems:   Chronic systolic heart failure (HCC)   Hypertension   Hyperlipidemia   PAF (paroxysmal atrial fibrillation) (HCC)   CAD (coronary artery disease)   Diabetes (HCC)   Aortic aneurysm with dissection (HCC)   Atherosclerosis of native arteries of the extremities with ulceration (HCC)   Hyperkalemia    Discharge Instructions:  Allergies as of 11/22/2020   No Known Allergies     Medication List    TAKE these medications   amLODipine 10 MG tablet Commonly known as: NORVASC Take 10 mg by mouth daily.   aspirin EC 81 MG tablet Take 81 mg by mouth daily. Swallow whole.   atorvastatin 80 MG tablet Commonly known as: LIPITOR Take 80 mg by mouth every evening.   calcium carbonate 500 MG chewable tablet Commonly known as: TUMS - dosed in mg elemental calcium Chew 1 tablet by mouth daily as needed for indigestion or heartburn.   carvedilol 25 MG tablet Commonly known as: COREG Take 1 tablet (25 mg total) by mouth 2 (two)  times daily.   cloNIDine 0.3 MG tablet Commonly known as: CATAPRES Take 0.3 mg by mouth 2 (two) times daily.   glipiZIDE 10 MG 24 hr tablet Commonly known as: GLUCOTROL XL Take 10 mg by mouth daily.   Lantus SoloStar 100 UNIT/ML Solostar Pen Generic drug: insulin glargine Inject 62 Units into the skin daily.   lisinopril 40 MG tablet Commonly known as: ZESTRIL Hold until  followup with your outpatient doctor due to elevated potassium and creatinine. What changed:   how much to take  how to take this  when to take this  additional instructions   spironolactone 50 MG tablet Commonly known as: ALDACTONE Hold until followup with your outpatient doctor due to elevated potassium and creatinine What changed:   how much to take  how to take this  when to take this  additional instructions        Follow-up Information    [facility]. Schedule an appointment as soon as possible for a visit on 11/29/2020.   Specialty: General Practice Why: AT 140 Contact information: [address] [place] [address] [phone]               No Known Allergies   The results of significant diagnostics from this hospitalization (including imaging, microbiology, ancillary and laboratory) are listed below for reference.   Consultations:   Procedures/Studies: No results found.    Labs: BNP (last 3 results) No results for input(s): BNP in the last 8760 hours. Basic Metabolic Panel: Recent Labs  Lab 11/20/20 1854 11/20/20 2151 11/21/20 0458 11/21/20 0916 11/21/20 1919 11/22/20 0505  NA 132* 130* 133* 129*  --  134*  K 6.5* 6.9* 6.0* 7.0* 5.2* 5.0  CL 105 103 108 106  --  105  CO2 21* 21* 20* 18*  --  22  GLUCOSE 86 201* 150* 219*  --  167*  BUN 43* 44* 37* 33*  --  29*  CREATININE 1.96* 2.00* 1.63* 1.68*  --  1.44*  CALCIUM 10.9* 10.8* 10.5* 10.0  --  9.7  MG  --   --   --   --   --  1.6*   Liver Function Tests: No results for input(s): AST, ALT, ALKPHOS, BILITOT, PROT, ALBUMIN in the last 168 hours. No results for input(s): LIPASE, AMYLASE in the last 168 hours. No results for input(s): AMMONIA in the last 168 hours. CBC: Recent Labs  Lab 11/20/20 1419 11/22/20 0505  WBC 9.4 7.0  HGB 13.5 12.9*  HCT 40.3 38.0*  MCV 96.0 96.0  PLT 287 257   Cardiac Enzymes: Recent Labs  Lab 11/20/20 1821   CKTOTAL 108   BNP: Invalid input(s): POCBNP CBG: Recent Labs  Lab 11/21/20 1139 11/21/20 1632 11/21/20 2314 11/22/20 0751 11/22/20 0844  GLUCAP 246* 229* 184* 197* 280*   D-Dimer No results for input(s): DDIMER in the last 72 hours. Hgb A1c Recent Labs    11/21/20 0916  HGBA1C 8.0*   Lipid Profile No results for input(s): CHOL, HDL, LDLCALC, TRIG, CHOLHDL, LDLDIRECT in the last 72 hours. Thyroid function studies No results for input(s): TSH, T4TOTAL, T3FREE, THYROIDAB in the last 72 hours.  Invalid input(s): FREET3 Anemia work up No results for input(s): VITAMINB12, FOLATE, FERRITIN, TIBC, IRON, RETICCTPCT in the last 72 hours. Urinalysis    Component Value Date/Time   COLORURINE STRAW (A) 11/20/2020 1419   APPEARANCEUR CLEAR (A) 11/20/2020 1419   APPEARANCEUR Clear 09/18/2014 2018   LABSPEC 1.006 11/20/2020  1419   LABSPEC 1.005 09/18/2014 2018   PHURINE 7.0 11/20/2020 1419   GLUCOSEU NEGATIVE 11/20/2020 1419   GLUCOSEU 150 mg/dL 62/83/6629 4765   HGBUR NEGATIVE 11/20/2020 1419   BILIRUBINUR NEGATIVE 11/20/2020 1419   BILIRUBINUR Negative 09/18/2014 2018   KETONESUR NEGATIVE 11/20/2020 1419   PROTEINUR NEGATIVE 11/20/2020 1419   UROBILINOGEN 0.2 09/12/2013 1555   NITRITE NEGATIVE 11/20/2020 1419   LEUKOCYTESUR NEGATIVE 11/20/2020 1419   LEUKOCYTESUR Negative 09/18/2014 2018   Sepsis Labs Invalid input(s): PROCALCITONIN,  WBC,  LACTICIDVEN Microbiology Recent Results (from the past 240 hour(s))  Resp Panel by RT-PCR (Flu A&B, Covid) Nasopharyngeal Swab     Status: None   Collection Time: 11/20/20  8:17 PM   Specimen: Nasopharyngeal Swab; Nasopharyngeal(NP) swabs in vial transport medium  Result Value Ref Range Status   SARS Coronavirus 2 by RT PCR NEGATIVE NEGATIVE Final    Comment: (NOTE) SARS-CoV-2 target nucleic acids are NOT DETECTED.  The SARS-CoV-2 RNA is generally detectable in upper respiratory specimens during the acute phase of infection. The  lowest concentration of SARS-CoV-2 viral copies this assay can detect is 138 copies/mL. A negative result does not preclude SARS-Cov-2 infection and should not be used as the sole basis for treatment or other patient management decisions. A negative result may occur with  improper specimen collection/handling, submission of specimen other than nasopharyngeal swab, presence of viral mutation(s) within the areas targeted by this assay, and inadequate number of viral copies(<138 copies/mL). A negative result must be combined with clinical observations, patient history, and epidemiological information. The expected result is Negative.  Fact Sheet for Patients:  BloggerCourse.com  Fact Sheet for Healthcare Providers:  SeriousBroker.it  This test is no t yet approved or cleared by the [place] FDA and  has been authorized for detection and/or diagnosis of SARS-CoV-2 by FDA under an Emergency Use Authorization (EUA). This EUA will remain  in effect (meaning this test can be used) for the duration of the COVID-19 declaration under Section 564(b)(1) of the Act, 21 U.S.C.section 360bbb-3(b)(1), unless the authorization is terminated  or revoked sooner.       Influenza A by PCR NEGATIVE NEGATIVE Final   Influenza B by PCR NEGATIVE NEGATIVE Final    Comment: (NOTE) The Xpert Xpress SARS-CoV-2/FLU/RSV plus assay is intended as an aid in the diagnosis of influenza from Nasopharyngeal swab specimens and should not be used as a sole basis for treatment. Nasal washings and aspirates are unacceptable for Xpert Xpress SARS-CoV-2/FLU/RSV testing.  Fact Sheet for Patients: BloggerCourse.com  Fact Sheet for Healthcare Providers: SeriousBroker.it  This test is not yet approved or cleared by the [place] FDA and has been authorized for detection and/or diagnosis of SARS-CoV-2 by FDA under  an Emergency Use Authorization (EUA). This EUA will remain in effect (meaning this test can be used) for the duration of the COVID-19 declaration under Section 564(b)(1) of the Act, 21 U.S.C. section 360bbb-3(b)(1), unless the authorization is terminated or revoked.  Performed at [facility], [address] [place], [place] [address]      Total time spend on discharging this patient, including the last patient exam, discussing the hospital stay, instructions for ongoing care as it relates to all pertinent caregivers, as well as preparing the medical discharge records, prescriptions, and/or referrals as applicable, is 30 minutes.    Darlin Priestly, MD  [facility] 11/22/2020, 11:20 AM

## 2020-11-22 NOTE — Plan of Care (Signed)
  Problem: Education: Goal: Knowledge of General Education information will improve Description: Including pain rating scale, medication(s)/side effects and non-pharmacologic comfort measures 11/22/2020 1058 by Momina Hunton J, LPN Outcome: Progressing 11/22/2020 0940 by Grayland Ormond, LPN Outcome: Progressing   Problem: Health Behavior/Discharge Planning: Goal: Ability to manage health-related needs will improve 11/22/2020 1058 by Blanca Carreon J, LPN Outcome: Progressing 11/22/2020 0940 by Grayland Ormond, LPN Outcome: Progressing   Problem: Clinical Measurements: Goal: Ability to maintain clinical measurements within normal limits will improve 11/22/2020 1058 by Naysha Sholl J, LPN Outcome: Progressing 11/22/2020 0940 by Grayland Ormond, LPN Outcome: Progressing Goal: Will remain free from infection 11/22/2020 1058 by Voncile Schwarz J, LPN Outcome: Progressing 11/22/2020 0940 by Grayland Ormond, LPN Outcome: Progressing Goal: Diagnostic test results will improve 11/22/2020 1058 by Khalee Mazo J, LPN Outcome: Progressing 11/22/2020 0940 by Grayland Ormond, LPN Outcome: Progressing Goal: Respiratory complications will improve 11/22/2020 1058 by Leotis Shames J, LPN Outcome: Progressing 11/22/2020 0940 by Grayland Ormond, LPN Outcome: Progressing Goal: Cardiovascular complication will be avoided 11/22/2020 1058 by Kimari Lienhard J, LPN Outcome: Progressing 11/22/2020 0940 by Grayland Ormond, LPN Outcome: Progressing   Problem: Activity: Goal: Risk for activity intolerance will decrease 11/22/2020 1058 by Nahsir Venezia J, LPN Outcome: Progressing 11/22/2020 0940 by Grayland Ormond, LPN Outcome: Progressing   Problem: Nutrition: Goal: Adequate nutrition will be maintained 11/22/2020 1058 by Buena Boehm J, LPN Outcome: Progressing 11/22/2020 0940 by Grayland Ormond, LPN Outcome: Progressing   Problem: Coping: Goal: Level  of anxiety will decrease 11/22/2020 1058 by Madyson Lukach J, LPN Outcome: Progressing 11/22/2020 0940 by Grayland Ormond, LPN Outcome: Progressing   Problem: Elimination: Goal: Will not experience complications related to bowel motility 11/22/2020 1058 by Hershy Flenner J, LPN Outcome: Progressing 11/22/2020 0940 by Grayland Ormond, LPN Outcome: Progressing Goal: Will not experience complications related to urinary retention 11/22/2020 1058 by Abijah Roussel J, LPN Outcome: Progressing 11/22/2020 0940 by Grayland Ormond, LPN Outcome: Progressing   Problem: Pain Managment: Goal: General experience of comfort will improve 11/22/2020 1058 by Vardaan Depascale J, LPN Outcome: Progressing 11/22/2020 0940 by Grayland Ormond, LPN Outcome: Progressing   Problem: Safety: Goal: Ability to remain free from injury will improve 11/22/2020 1058 by Dynasia Kercheval J, LPN Outcome: Progressing 11/22/2020 0940 by Grayland Ormond, LPN Outcome: Progressing   Problem: Skin Integrity: Goal: Risk for impaired skin integrity will decrease 11/22/2020 1058 by Hildred Mollica J, LPN Outcome: Progressing 11/22/2020 0940 by Grayland Ormond, LPN Outcome: Progressing

## 2020-11-22 NOTE — Plan of Care (Signed)
Discussed with patient plan of care for the evening, pain management and medication for the shift with some teach back displayed.

## 2020-11-22 NOTE — Plan of Care (Signed)
Pt discharged home per order at this time.All discharge education/instructions and medications reviewed with Pt.Follow up appointments also communicated with Pt. Pt expressed understanding and will comply with discharge instructions.All Pt belongings handed over to Pt.Patient transported by private car.no verbal c/o or any ssx of distress.

## 2020-11-22 NOTE — Plan of Care (Signed)

## 2020-11-22 NOTE — Progress Notes (Signed)
Informed patient was missing from unit. Missing person notification paged overhead. Contacted by D[place] Brick that a family member had picked him up but would be returning for IV removal and discharge instructions.

## 2021-01-06 ENCOUNTER — Observation Stay
Admission: EM | Admit: 2021-01-06 | Discharge: 2021-01-07 | Disposition: A | Discharge status: Home / Self Care | Payer: Medicaid Other | Attending: Internal Medicine | Admitting: Internal Medicine

## 2021-01-06 ENCOUNTER — Other Ambulatory Visit: Payer: Self-pay

## 2021-01-06 ENCOUNTER — Observation Stay: Payer: Medicaid Other

## 2021-01-06 ENCOUNTER — Emergency Department: Payer: Medicaid Other

## 2021-01-06 DIAGNOSIS — R110 Nausea: Secondary | ICD-10-CM

## 2021-01-06 DIAGNOSIS — I2510 Atherosclerotic heart disease of native coronary artery without angina pectoris: Secondary | ICD-10-CM | POA: Diagnosis not present

## 2021-01-06 DIAGNOSIS — Z951 Presence of aortocoronary bypass graft: Secondary | ICD-10-CM | POA: Diagnosis not present

## 2021-01-06 DIAGNOSIS — F17210 Nicotine dependence, cigarettes, uncomplicated: Secondary | ICD-10-CM | POA: Diagnosis not present

## 2021-01-06 DIAGNOSIS — E785 Hyperlipidemia, unspecified: Secondary | ICD-10-CM

## 2021-01-06 DIAGNOSIS — E1122 Type 2 diabetes mellitus with diabetic chronic kidney disease: Secondary | ICD-10-CM | POA: Diagnosis not present

## 2021-01-06 DIAGNOSIS — E1129 Type 2 diabetes mellitus with other diabetic kidney complication: Secondary | ICD-10-CM | POA: Diagnosis present

## 2021-01-06 DIAGNOSIS — R531 Weakness: Secondary | ICD-10-CM

## 2021-01-06 DIAGNOSIS — Z794 Long term (current) use of insulin: Secondary | ICD-10-CM | POA: Insufficient documentation

## 2021-01-06 DIAGNOSIS — Z79899 Other long term (current) drug therapy: Secondary | ICD-10-CM | POA: Diagnosis not present

## 2021-01-06 DIAGNOSIS — Z720 Tobacco use: Secondary | ICD-10-CM

## 2021-01-06 DIAGNOSIS — Z7984 Long term (current) use of oral hypoglycemic drugs: Secondary | ICD-10-CM | POA: Diagnosis not present

## 2021-01-06 DIAGNOSIS — R778 Other specified abnormalities of plasma proteins: Secondary | ICD-10-CM

## 2021-01-06 DIAGNOSIS — Z20822 Contact with and (suspected) exposure to covid-19: Secondary | ICD-10-CM | POA: Diagnosis not present

## 2021-01-06 DIAGNOSIS — I4891 Unspecified atrial fibrillation: Secondary | ICD-10-CM

## 2021-01-06 DIAGNOSIS — I5022 Chronic systolic (congestive) heart failure: Secondary | ICD-10-CM | POA: Insufficient documentation

## 2021-01-06 DIAGNOSIS — R112 Nausea with vomiting, unspecified: Secondary | ICD-10-CM | POA: Diagnosis present

## 2021-01-06 DIAGNOSIS — I10 Essential (primary) hypertension: Secondary | ICD-10-CM

## 2021-01-06 DIAGNOSIS — Z7982 Long term (current) use of aspirin: Secondary | ICD-10-CM | POA: Insufficient documentation

## 2021-01-06 DIAGNOSIS — E875 Hyperkalemia: Secondary | ICD-10-CM | POA: Diagnosis not present

## 2021-01-06 DIAGNOSIS — E871 Hypo-osmolality and hyponatremia: Secondary | ICD-10-CM

## 2021-01-06 DIAGNOSIS — R7989 Other specified abnormal findings of blood chemistry: Secondary | ICD-10-CM | POA: Diagnosis present

## 2021-01-06 DIAGNOSIS — N1831 Chronic kidney disease, stage 3a: Secondary | ICD-10-CM | POA: Diagnosis not present

## 2021-01-06 DIAGNOSIS — I130 Hypertensive heart and chronic kidney disease with heart failure and stage 1 through stage 4 chronic kidney disease, or unspecified chronic kidney disease: Secondary | ICD-10-CM | POA: Insufficient documentation

## 2021-01-06 DIAGNOSIS — I5023 Acute on chronic systolic (congestive) heart failure: Secondary | ICD-10-CM | POA: Diagnosis present

## 2021-01-06 DIAGNOSIS — N183 Chronic kidney disease, stage 3 unspecified: Secondary | ICD-10-CM | POA: Diagnosis present

## 2021-01-06 LAB — COMPREHENSIVE METABOLIC PANEL
ALT: 25 U/L (ref 0–44)
AST: 17 U/L (ref 15–41)
Albumin: 4.7 g/dL (ref 3.5–5.0)
Alkaline Phosphatase: 74 U/L (ref 38–126)
Anion gap: 6 (ref 5–15)
BUN: 34 mg/dL — ABNORMAL HIGH (ref 8–23)
CO2: 25 mmol/L (ref 22–32)
Calcium: 10.1 mg/dL (ref 8.9–10.3)
Chloride: 95 mmol/L — ABNORMAL LOW (ref 98–111)
Creatinine, Ser: 1.41 mg/dL — ABNORMAL HIGH (ref 0.61–1.24)
GFR, Estimated: 57 mL/min — ABNORMAL LOW (ref >60)
Glucose, Bld: 193 mg/dL — ABNORMAL HIGH (ref 70–99)
Potassium: 7.3 mmol/L (ref 3.5–5.1)
Sodium: 126 mmol/L — ABNORMAL LOW (ref 135–145)
Total Bilirubin: 1 mg/dL (ref 0.3–1.2)
Total Protein: 7.9 g/dL (ref 6.5–8.1)

## 2021-01-06 LAB — LIPASE, BLOOD: Lipase: 26 U/L (ref 11–51)

## 2021-01-06 LAB — BASIC METABOLIC PANEL
Anion gap: 9 (ref 5–15)
Anion gap: 7 (ref 5–15)
BUN: 33 mg/dL — ABNORMAL HIGH (ref 8–23)
BUN: 29 mg/dL — ABNORMAL HIGH (ref 8–23)
CO2: 26 mmol/L (ref 22–32)
CO2: 26 mmol/L (ref 22–32)
Calcium: 10.7 mg/dL — ABNORMAL HIGH (ref 8.9–10.3)
Calcium: 10.4 mg/dL — ABNORMAL HIGH (ref 8.9–10.3)
Chloride: 96 mmol/L — ABNORMAL LOW (ref 98–111)
Chloride: 96 mmol/L — ABNORMAL LOW (ref 98–111)
Creatinine, Ser: 1.29 mg/dL — ABNORMAL HIGH (ref 0.61–1.24)
Creatinine, Ser: 1.28 mg/dL — ABNORMAL HIGH (ref 0.61–1.24)
GFR, Estimated: >60 mL/min (ref >60)
GFR, Estimated: >60 mL/min (ref >60)
Glucose, Bld: 184 mg/dL — ABNORMAL HIGH (ref 70–99)
Glucose, Bld: 132 mg/dL — ABNORMAL HIGH (ref 70–99)
Potassium: 6 mmol/L — ABNORMAL HIGH (ref 3.5–5.1)
Potassium: 5.5 mmol/L — ABNORMAL HIGH (ref 3.5–5.1)
Sodium: 131 mmol/L — ABNORMAL LOW (ref 135–145)
Sodium: 129 mmol/L — ABNORMAL LOW (ref 135–145)

## 2021-01-06 LAB — CBC
HCT: 35.1 % — ABNORMAL LOW (ref 39.0–52.0)
Hemoglobin: 12 g/dL — ABNORMAL LOW (ref 13.0–17.0)
MCH: 32.6 pg (ref 26.0–34.0)
MCHC: 34.2 g/dL (ref 30.0–36.0)
MCV: 95.4 fL (ref 80.0–100.0)
Platelets: 225 10*3/uL (ref 150–400)
RBC: 3.68 MIL/uL — ABNORMAL LOW (ref 4.22–5.81)
RDW: 13.4 % (ref 11.5–15.5)
WBC: 11.3 10*3/uL — ABNORMAL HIGH (ref 4.0–10.5)
nRBC: 0 % (ref 0.0–0.2)

## 2021-01-06 LAB — GLUCOSE, CAPILLARY: Glucose-Capillary: 134 mg/dL — ABNORMAL HIGH (ref 70–99)

## 2021-01-06 LAB — CBG MONITORING, ED
Glucose-Capillary: 189 mg/dL — ABNORMAL HIGH (ref 70–99)
Glucose-Capillary: 176 mg/dL — ABNORMAL HIGH (ref 70–99)
Glucose-Capillary: 132 mg/dL — ABNORMAL HIGH (ref 70–99)

## 2021-01-06 LAB — URINALYSIS, COMPLETE (UACMP) WITH MICROSCOPIC
Bacteria, UA: NONE SEEN
Bilirubin Urine: NEGATIVE
Glucose, UA: 50 mg/dL — AB
Hgb urine dipstick: NEGATIVE
Ketones, ur: NEGATIVE mg/dL
Leukocytes,Ua: NEGATIVE
Nitrite: NEGATIVE
Protein, ur: NEGATIVE mg/dL
Specific Gravity, Urine: 1.013 (ref 1.005–1.030)
pH: 7 (ref 5.0–8.0)

## 2021-01-06 LAB — OSMOLALITY, URINE: Osmolality, Ur: 464 mOsm/kg (ref 300–900)

## 2021-01-06 LAB — TROPONIN I (HIGH SENSITIVITY)
Troponin I (High Sensitivity): 18 ng/L — ABNORMAL HIGH (ref <18)
Troponin I (High Sensitivity): 18 ng/L — ABNORMAL HIGH (ref <18)
Troponin I (High Sensitivity): 17 ng/L (ref <18)

## 2021-01-06 LAB — RESP PANEL BY RT-PCR (FLU A&B, COVID) ARPGX2
Influenza A by PCR: NEGATIVE
Influenza B by PCR: NEGATIVE
SARS Coronavirus 2 by RT PCR: NEGATIVE

## 2021-01-06 LAB — SODIUM, URINE, RANDOM: Sodium, Ur: 73 mmol/L

## 2021-01-06 LAB — POTASSIUM: Potassium: 5.7 mmol/L — ABNORMAL HIGH (ref 3.5–5.1)

## 2021-01-06 LAB — OSMOLALITY: Osmolality: 284 mOsm/kg (ref 275–295)

## 2021-01-06 LAB — BRAIN NATRIURETIC PEPTIDE: B Natriuretic Peptide: 441.7 pg/mL — ABNORMAL HIGH (ref 0.0–100.0)

## 2021-01-06 MED ORDER — CALCIUM GLUCONATE-NACL 1-0.675 GM/50ML-% IV SOLN
1.0000 g | Freq: Once | INTRAVENOUS | Status: AC
  Administered 2021-01-06: 1000 mg via INTRAVENOUS
  Filled 2021-01-06: qty 50

## 2021-01-06 MED ORDER — SODIUM CHLORIDE 1 G PO TABS
1.0000 g | ORAL_TABLET | Freq: Two times a day (BID) | ORAL | Status: DC
  Administered 2021-01-06: 1 g via ORAL
  Filled 2021-01-06 (×2): qty 1

## 2021-01-06 MED ORDER — ASPIRIN EC 81 MG PO TBEC
81.0000 mg | DELAYED_RELEASE_TABLET | Freq: Every day | ORAL | Status: DC
  Administered 2021-01-07: 81 mg via ORAL
  Filled 2021-01-06: qty 1

## 2021-01-06 MED ORDER — SODIUM ZIRCONIUM CYCLOSILICATE 10 G PO PACK
10.0000 g | PACK | Freq: Once | ORAL | Status: AC
  Administered 2021-01-06: 10 g via ORAL
  Filled 2021-01-06: qty 1

## 2021-01-06 MED ORDER — ONDANSETRON 4 MG PO TBDP
4.0000 mg | ORAL_TABLET | Freq: Once | ORAL | Status: AC | PRN
  Administered 2021-01-06: 4 mg via ORAL
  Filled 2021-01-06: qty 1

## 2021-01-06 MED ORDER — AMLODIPINE BESYLATE 10 MG PO TABS
10.0000 mg | ORAL_TABLET | Freq: Every day | ORAL | Status: DC
  Administered 2021-01-07: 10 mg via ORAL
  Filled 2021-01-06: qty 1

## 2021-01-06 MED ORDER — PANTOPRAZOLE SODIUM 40 MG PO TBEC
40.0000 mg | DELAYED_RELEASE_TABLET | Freq: Every day | ORAL | Status: DC
  Administered 2021-01-06: 40 mg via ORAL
  Filled 2021-01-06: qty 1

## 2021-01-06 MED ORDER — INSULIN GLARGINE 100 UNIT/ML ~~LOC~~ SOLN
42.0000 [IU] | Freq: Every day | SUBCUTANEOUS | Status: DC
  Administered 2021-01-07: 42 [IU] via SUBCUTANEOUS
  Filled 2021-01-06 (×2): qty 0.42

## 2021-01-06 MED ORDER — CLONIDINE HCL 0.1 MG PO TABS
0.3000 mg | ORAL_TABLET | Freq: Two times a day (BID) | ORAL | Status: DC
  Administered 2021-01-06 – 2021-01-07 (×2): 0.3 mg via ORAL
  Filled 2021-01-06 (×2): qty 3

## 2021-01-06 MED ORDER — CARVEDILOL 25 MG PO TABS
25.0000 mg | ORAL_TABLET | Freq: Two times a day (BID) | ORAL | Status: DC
  Administered 2021-01-06: 25 mg via ORAL
  Filled 2021-01-06 (×2): qty 1

## 2021-01-06 MED ORDER — ACETAMINOPHEN 325 MG PO TABS: 650.0000 mg | ORAL_TABLET | Freq: Four times a day (QID) | ORAL | Status: DC | PRN

## 2021-01-06 MED ORDER — FAMOTIDINE IN NACL 20-0.9 MG/50ML-% IV SOLN
20.0000 mg | Freq: Once | INTRAVENOUS | Status: AC
  Administered 2021-01-06: 20 mg via INTRAVENOUS
  Filled 2021-01-06: qty 50

## 2021-01-06 MED ORDER — SODIUM CHLORIDE 0.9 % IV SOLN: 1.0000 g | Freq: Once | INTRAVENOUS | Status: DC

## 2021-01-06 MED ORDER — ENOXAPARIN SODIUM 60 MG/0.6ML IJ SOSY
0.5000 mg/kg | PREFILLED_SYRINGE | INTRAMUSCULAR | Status: DC
  Administered 2021-01-06: 57.5 mg via SUBCUTANEOUS
  Filled 2021-01-06: qty 0.6
  Filled 2021-01-06: qty 0.57

## 2021-01-06 MED ORDER — INSULIN ASPART 100 UNIT/ML IJ SOLN
INTRAMUSCULAR | Status: AC
  Administered 2021-01-06: 5 [IU] via INTRAVENOUS
  Filled 2021-01-06: qty 1

## 2021-01-06 MED ORDER — CALCIUM CARBONATE ANTACID 500 MG PO CHEW: 1.0000 | CHEWABLE_TABLET | Freq: Every day | ORAL | Status: DC | PRN

## 2021-01-06 MED ORDER — ATORVASTATIN CALCIUM 80 MG PO TABS: 80.0000 mg | ORAL_TABLET | Freq: Every evening | ORAL | Status: DC

## 2021-01-06 MED ORDER — INSULIN ASPART 100 UNIT/ML IJ SOLN
0.0000 [IU] | Freq: Three times a day (TID) | INTRAMUSCULAR | Status: DC
  Administered 2021-01-06: 1 [IU] via SUBCUTANEOUS
  Administered 2021-01-06: 2 [IU] via SUBCUTANEOUS
  Filled 2021-01-06 (×2): qty 1

## 2021-01-06 MED ORDER — INSULIN ASPART 100 UNIT/ML IJ SOLN: 0.0000 [IU] | Freq: Every day | INTRAMUSCULAR | Status: DC

## 2021-01-06 MED ORDER — HYDRALAZINE HCL 20 MG/ML IJ SOLN: 5.0000 mg | INTRAMUSCULAR | Status: DC | PRN

## 2021-01-06 MED ORDER — ONDANSETRON HCL 4 MG/2ML IJ SOLN
4.0000 mg | Freq: Three times a day (TID) | INTRAMUSCULAR | Status: DC | PRN
  Administered 2021-01-06: 4 mg via INTRAVENOUS
  Filled 2021-01-06: qty 2

## 2021-01-06 MED ORDER — SODIUM POLYSTYRENE SULFONATE 15 GM/60ML PO SUSP
15.0000 g | Freq: Once | ORAL | Status: AC
  Administered 2021-01-06: 15 g via ORAL
  Filled 2021-01-06: qty 60

## 2021-01-06 MED ORDER — SODIUM CHLORIDE 0.9 % IV SOLN
12.5000 mg | Freq: Four times a day (QID) | INTRAVENOUS | Status: DC | PRN
  Administered 2021-01-06: 12.5 mg via INTRAVENOUS
  Filled 2021-01-06: qty 0.5

## 2021-01-06 MED ORDER — NICOTINE 21 MG/24HR TD PT24
21.0000 mg | MEDICATED_PATCH | Freq: Every day | TRANSDERMAL | Status: DC
  Administered 2021-01-06 – 2021-01-07 (×2): 21 mg via TRANSDERMAL
  Filled 2021-01-06 (×2): qty 1

## 2021-01-06 MED ORDER — INSULIN ASPART 100 UNIT/ML IV SOLN
5.0000 [IU] | Freq: Once | INTRAVENOUS | Status: AC
  Filled 2021-01-06: qty 0.05

## 2021-01-06 MED ORDER — SODIUM BICARBONATE 8.4 % IV SOLN
50.0000 meq | Freq: Once | INTRAVENOUS | Status: AC
  Administered 2021-01-06: 50 meq via INTRAVENOUS
  Filled 2021-01-06: qty 50

## 2021-01-06 MED ORDER — DEXTROSE 50 % IV SOLN
1.0000 | Freq: Once | INTRAVENOUS | Status: AC
  Administered 2021-01-06: 50 mL via INTRAVENOUS
  Filled 2021-01-06: qty 50

## 2021-01-06 NOTE — ED Notes (Signed)
CBG: 176 °

## 2021-01-06 NOTE — ED Provider Notes (Signed)
[facility] Emergency Department Provider Note  ____________________________________________   Event Date/Time   First MD Initiated Contact with Patient 01/06/21 [phone]     (approximate)  I have reviewed the triage vital signs and the nursing notes.   HISTORY  Chief Complaint Nausea and Weakness    HPI Marc [name] is a 60 y.o. male with CHF, EF of 45 to 50%, coronary disease, diabetes, hypertension, hyperlipidemia who comes in for not feeling well.  Patient reports 3 to 4 days of feeling nausea, weak and just not feeling well.  Is been constant, nothing makes it better, nothing makes it worse.  Patient states is not as bad as last time he had to come into the hospital but felt like something was off.  He denies any chest pain.  My asked if he had any shortness of breath he said he maybe feels a little off.  When asked if he had any urinary symptoms he said that might feel little off as well.  On review of records patient was admitted 3/19 for hyperkalemia due to being on Bactrim.  Bactrim was discontinued and lisinopril and spironolactone was held.  Patient followed up outpatient he states and his potassium levels were still doing well and so they restarted him on these medications 1 to 2 weeks ago.     Past Medical History:  Diagnosis Date  . Anxiety   . Cardiomyopathy (HCC)    45-50%  . CHF (congestive heart failure) (HCC)   . Chronic systolic heart failure (HCC) 07/2013   Ejection fraction of 45-50% with possible inferior wall hypokinesis  . Coronary artery disease    Significant left main and Severe three-vessel coronary artery disease in December of 2014. He underwent CABG in January of 2015 with LIMA to LAD, sequential SVG to ramus/OM 2 and SVG to OM 3  . Diabetes mellitus without complication (HCC)   . Dysrhythmia   . Hyperlipidemia   . Hypertension   . Medical non-compliance   . MI (myocardial infarction) (HCC)   . Obesity   . Tobacco  abuse     Patient Active Problem List   Diagnosis Date Noted  . Hyperkalemia 11/20/2020  . Cellulitis and abscess of toe of left foot 04/05/2020  . Diabetes (HCC) 03/14/2018  . Blurred vision 03/13/2018  . Cellulitis of toe, right 02/29/2016  . Atherosclerosis of native arteries of the extremities with ulceration (HCC) 10/21/2014  . Acute ischemic colitis (HCC) 09/19/2014  . Aortic aneurysm with dissection (HCC) 09/19/2014  . Type 2 diabetes mellitus without complication (HCC) 09/19/2014  . Encounter for therapeutic drug monitoring 09/26/2013  . CAD (coronary artery disease) 09/16/2013  . Chronic coronary artery disease 09/16/2013  . Coronary artery disease   . PAF (paroxysmal atrial fibrillation) (HCC) 07/29/2013  . Atrial fibrillation (HCC) 07/29/2013  . Hypertension   . Hyperlipidemia   . Chronic systolic heart failure (HCC) 07/05/2013    Past Surgical History:  Procedure Laterality Date  . AMPUTATION Left 04/08/2020   Procedure: AMPUTATION RAY;  Surgeon: Rosetta Posner, DPM;  Location: [facility] ORS;  Service: Podiatry;  Laterality: Left;  . AMPUTATION Left 06/25/2020   Procedure: AMPUTATION RAY 1ST RAY PARTIAL LEFT;  Surgeon: Rosetta Posner, DPM;  Location: [facility] ORS;  Service: Podiatry;  Laterality: Left;  . AMPUTATION TOE Right 02/29/2016   Procedure: AMPUTATION TOE;  Surgeon: Linus Galas, DPM;  Location: [facility] ORS;  Service: Podiatry;  Laterality: Right;  Great toe  . CARDIAC CATHETERIZATION  [facility] no stent  . CORONARY ARTERY BYPASS GRAFT N/A 09/16/2013   Procedure: CORONARY ARTERY BYPASS GRAFTING (CABG);  Surgeon: [name], MD;  Location: [facility] OR;  Service: Open Heart Surgery;  Laterality: N/A;  CABG x four, using left internal mammary artery and right leg greater saphenous vein  . INTRAOPERATIVE TRANSESOPHAGEAL ECHOCARDIOGRAM N/A 09/16/2013   Procedure: INTRAOPERATIVE TRANSESOPHAGEAL ECHOCARDIOGRAM;  Surgeon: [name], MD;  Location: [facility] OR;  Service: Open Heart  Surgery;  Laterality: N/A;  . knee surgery Right   . LASIK Bilateral   . LOWER EXTREMITY ANGIOGRAPHY Left 04/07/2020   Procedure: Lower Extremity Angiography;  Surgeon: Renford Dills, MD;  Location: [facility] INVASIVE CV LAB;  Service: Cardiovascular;  Laterality: Left;  . LOWER EXTREMITY ANGIOGRAPHY Left 04/09/2020   Procedure: Lower Extremity Angiography (Pedal Approach);  Surgeon: Renford Dills, MD;  Location: [facility] INVASIVE CV LAB;  Service: Cardiovascular;  Laterality: Left;    Prior to Admission medications   Medication Sig Start Date End Date Taking? Authorizing Provider  amLODipine (NORVASC) 10 MG tablet Take 10 mg by mouth daily.    [provider]  aspirin EC 81 MG tablet Take 81 mg by mouth daily. Swallow whole.    [provider]  atorvastatin (LIPITOR) 80 MG tablet Take 80 mg by mouth every evening.  01/31/18   [provider]  calcium carbonate (TUMS - DOSED IN MG ELEMENTAL CALCIUM) 500 MG chewable tablet Chew 1 tablet by mouth daily as needed for indigestion or heartburn.    [provider]  carvedilol (COREG) 25 MG tablet Take 1 tablet (25 mg total) by mouth 2 (two) times daily. 11/22/20   Darlin Priestly, MD  cloNIDine (CATAPRES) 0.3 MG tablet Take 0.3 mg by mouth 2 (two) times daily.  01/24/18   [provider]  glipiZIDE (GLUCOTROL XL) 10 MG 24 hr tablet Take 10 mg by mouth daily.  01/31/18   [provider]  insulin glargine (LANTUS SOLOSTAR) 100 UNIT/ML Solostar Pen Inject 62 Units into the skin daily.    [provider]  lisinopril (ZESTRIL) 40 MG tablet Hold until followup with your outpatient doctor due to elevated potassium and creatinine. 11/22/20   Darlin Priestly, MD  spironolactone (ALDACTONE) 50 MG tablet Hold until followup with your outpatient doctor due to elevated potassium and creatinine 11/22/20   Darlin Priestly, MD    Allergies Patient has no known allergies.  Family History  Problem Relation Age of Onset  .  Heart disease Mother     Social History Social History   Tobacco Use  . Smoking status: Current Some Day Smoker    Packs/day: 0.50    Years: 45.00    Pack years: 22.50    Types: Cigarettes  . Smokeless tobacco: Never Used  . Tobacco comment: trying to quit  Vaping Use  . Vaping Use: Never used  Substance Use Topics  . Alcohol use: No    Alcohol/week: 0.0 standard drinks  . Drug use: No      Review of Systems Constitutional: No fever/chills, positive weakness Eyes: No visual changes. ENT: No sore throat. Cardiovascular: Denies chest pain. Respiratory: Mild shortness of breath Gastrointestinal: No abdominal pain.  Positive nausea Genitourinary: Negative for dysuria. Musculoskeletal: Negative for back pain. Skin: Negative for rash. Neurological: Negative for headaches, focal weakness or numbness. All other ROS negative ____________________________________________   PHYSICAL EXAM:  VITAL SIGNS: ED Triage Vitals [01/06/21 0809]  Enc Vitals Group     BP  120/79     Pulse Rate 63     Resp 16     Temp 97.7 F (36.5 C)     Temp Source Oral     SpO2 94 %     Weight      Height 6\' 1"  (1.854 m)     Head Circumference      Peak Flow      Pain Score 4     Pain Loc      Pain Edu?      Excl. in GC?     Constitutional: Alert and oriented. Well appearing and in no acute distress. Eyes: Conjunctivae are normal. EOMI. Head: Atraumatic. Nose: No congestion/rhinnorhea. Mouth/Throat: Mucous membranes are moist.   Neck: No stridor. Trachea Midline. FROM Cardiovascular: Normal rate, regular rhythm. Grossly normal heart sounds.  Good peripheral circulation.  Old well-healing scar Respiratory: Normal respiratory effort.  No retractions. Lungs CTAB. Gastrointestinal: Soft and nontender. No distention. No abdominal bruits.  Musculoskeletal: No lower extremity tenderness nor edema.  No joint effusions. Neurologic:  Normal speech and language. No gross focal neurologic deficits  are appreciated.  Skin:  Skin is warm, dry and intact. No rash noted. Psychiatric: Mood and affect are normal. Speech and behavior are normal. GU: Deferred   ____________________________________________   LABS (all labs ordered are listed, but only abnormal results are displayed)  Labs Reviewed  COMPREHENSIVE METABOLIC PANEL - Abnormal; Notable for the following components:      Result Value   Sodium 126 (*)    Potassium 7.3 (*)    Chloride 95 (*)    Glucose, Bld 193 (*)    BUN 34 (*)    Creatinine, Ser 1.41 (*)    GFR, Estimated 57 (*)    All other components within normal limits  CBC - Abnormal; Notable for the following components:   WBC 11.3 (*)    RBC 3.68 (*)    Hemoglobin 12.0 (*)    HCT 35.1 (*)    All other components within normal limits  CBG MONITORING, ED - Abnormal; Notable for the following components:   Glucose-Capillary 189 (*)    All other components within normal limits  LIPASE, BLOOD  URINALYSIS, COMPLETE (UACMP) WITH MICROSCOPIC  TROPONIN I (HIGH SENSITIVITY)   ____________________________________________   ED ECG REPORT I, , the attending physician, personally viewed and interpreted this ECG.  A. fib rate of 62, right bundle branch block, no ST elevation, T wave version lead III  Prior EKGs and has not had hyperkalemia also had widened QRSs ____________________________________________  RADIOLOGY Concha Se, personally viewed and evaluated these images (plain radiographs) as part of my medical decision making, as well as reviewing the written report by the radiologist.  ED MD interpretation: No pneumonia  Official radiology report(s): DG Chest Portable 1 View  Result Date: 01/06/2021 CLINICAL DATA:  Onset nausea last night.  Dizziness and weakness. EXAM: PORTABLE CHEST 1 VIEW COMPARISON:  Single-view of the chest 09/18/2014. FINDINGS: The patient is status post CABG. There is cardiomegaly. Aortic atherosclerosis is noted. Lungs  are clear. No pneumothorax or pleural effusion. 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 IMPRESSION: No acute disease. Cardiomegaly. Aortic Atherosclerosis (ICD10-I70.0). Electronically Signed   By: 09/20/2014 M.D.   On: 01/06/2021 10:10    ____________________________________________   PROCEDURES  Procedure(s) performed (including Critical Care):  .Critical Care Performed by: 03/08/2021, MD Authorized by: Concha Se, MD   Critical care  provider statement:    Critical care time (minutes):  35   Critical care was necessary to treat or prevent imminent or life-threatening deterioration of the following conditions: Hyperkalemia.   Critical care was time spent personally by me on the following activities:  Discussions with consultants, evaluation of patient's response to treatment, examination of patient, ordering and performing treatments and interventions, ordering and review of laboratory studies, ordering and review of radiographic studies, pulse oximetry, re-evaluation of patient's condition, obtaining history from patient or surrogate and review of old charts .1-3 Lead EKG Interpretation Performed by: Concha Se, MD Authorized by: Concha Se, MD     Interpretation: abnormal     ECG rate:  60s    ECG rate assessment: normal     Rhythm: atrial fibrillation     Ectopy: none     Conduction: normal       ____________________________________________   INITIAL IMPRESSION / ASSESSMENT AND PLAN / ED COURSE  Marc [name] was evaluated in Emergency Department on 01/06/2021 for the symptoms described in the history of present illness. He was evaluated in the context of the global COVID-19 pandemic, which necessitated consideration that the patient might be at risk for infection with the SARS-CoV-2 virus that causes COVID-19. Institutional protocols and algorithms that pertain to the evaluation of patients at risk for COVID-19 are in a state of rapid  change based on information released by regulatory bodies including the CDC and federal and state organizations. These policies and algorithms were followed during the patient's care in the ED.    Patient is a 60 year old who comes in with weakness and nausea.  Labs ordered from triage and were significant for hyperkalemia which is most likely the cause of symptoms.  Patient has been back on his lisinopril and spironolactone.  EKG looks concerning for new A. fib patient's A. fib rate controlled and will keep on the cardiac monitor.  Patient does have a widened QRS but similar to prior EKGs when he had hyperkalemia previously.  His abdomen is soft and nontender and I have low suspicion for acute abdominal process such as SBO.  Denies any chest pain but cardiac markers were ordered in triage.  A little bit of shortness of breath so chest x-ray ordered to evaluate for pneumonia and COVID ordered.  Patient was given medications to treat hyperkalemia.  Lokelma did not work last time so gave Kayexalate since that worked best.  Patient require admission to the hospital team       ____________________________________________   FINAL CLINICAL IMPRESSION(S) / ED DIAGNOSES   Final diagnoses:  Hyperkalemia  Hyponatremia  Generalized weakness      MEDICATIONS GIVEN DURING THIS VISIT:  Medications  calcium gluconate 1 g/ 50 mL sodium chloride IVPB (1,000 mg Intravenous New Bag/Given 01/06/21 0958)  ondansetron (ZOFRAN-ODT) disintegrating tablet 4 mg (4 mg Oral Given 01/06/21 0825)  insulin aspart (novoLOG) injection 5 Units (5 Units Intravenous Given 01/06/21 0956)    And  dextrose 50 % solution 50 mL (50 mLs Intravenous Given 01/06/21 0954)  sodium bicarbonate injection 50 mEq (50 mEq Intravenous Given 01/06/21 0951)  sodium polystyrene (KAYEXALATE) 15 GM/60ML suspension 15 g (15 g Oral Given 01/06/21 0950)     ED Discharge Orders    None       Note:  This document was prepared using Dragon voice  recognition software and may include unintentional dictation errors.   Concha Se, MD 01/06/21 1020

## 2021-01-06 NOTE — ED Triage Notes (Signed)
Pt to ER via [facility].   Pt reports feeling nauseated since last night and has been unable to tolerate anything by mouth. Pt reports feeling dizzy and weak. No V/D.   Hx diabetes.

## 2021-01-06 NOTE — ED Notes (Signed)
Pt back from CT. Warm blankets provided.

## 2021-01-06 NOTE — ED Notes (Signed)
IV line does not draw back now. Will redraw troponin. Informed dr Clyde Lundborg pt is vomiting again.

## 2021-01-06 NOTE — ED Notes (Signed)
Repeat EKG performed. Pt vomited emesis. Pt denies CP. Monitor continues to display ST depression and BBB. Dr Clyde Lundborg informed.

## 2021-01-06 NOTE — ED Notes (Signed)
7.3 potassium verbal to Dr. Artis Delay.

## 2021-01-06 NOTE — ED Notes (Signed)
Informed RN bed assigned [id number] 

## 2021-01-06 NOTE — ED Notes (Signed)
Dr. Funke at bedside. 

## 2021-01-06 NOTE — ED Notes (Signed)
ED secretary put in for transport to bring pt to floor.

## 2021-01-06 NOTE — H&P (Signed)
History and Physical    Marc [name]:[id number] DOB: 1961-08-02 DOA: 01/06/2021  Referring MD/NP/PA:   PCP: [facility]   Patient coming from:  The patient is coming from home.  At baseline, pt is independent for most of ADL.        Chief Complaint: Nausea, vomiting, weakness  HPI: Marc [name] is a 60 y.o. male with medical history significant of hypertension, hyperlipidemia, diabetes mellitus, tobacco abuse, CAD, CABG, CHF with EF of 40-50%, ischemic colitis, aortic aneurysm with dissection, atrial fibrillation not on anticoagulants, CKD stage IIIa, anxiety, left ray amputation, right toe amputation, who presents with nausea, vomiting, weakness.  Patient states that he has been feeling bad in the past 3 days.  He has multiple episode of nausea, vomiting with nonbilious nonbloody vomiting.  Denies diarrhea or abdominal pain.  Patient states that he has indigestion feeling.  No fever or chills.  Denies chest pain.  Patient has mild shortness of breath and mild cough.  Patient does not have dysuria or burning on urination, but reports difficulty urinating sometimes.  Patient states that he has generalized weakness and lightheadedness, no unilateral numbness or tingling his extremities.  No facial droop or slurred speech.  He has acid reflux symptoms. Per her daughter-in-law, patient had recent history of hyperkalemia.  His lisinopril and spironolactone were on hold, but rechecked labs showed normal potassium, then both medications were restarted recently.   ED Course: pt was found to have WBC 11.3, BNP 441, troponin level 18, 18, 17, lipase 26, pending COVID-19 PCR, stable renal function, sodium 126, potassium 7.3.  Temperature normal, blood pressure 120/79, heart rate 63, RR 16, oxygen saturation 94% on room air.  Chest x-ray negative.  Patient is placed on progressive bed for observation.  CT-abdomen/pelvis:  Cholelithiasis without complicating  factors.  Diverticulosis without diverticulitis.  No other focal abnormality is noted.   Review of Systems:   General: no fevers, chills, no body weight gain, has poor appetite, has fatigue HEENT: no blurry vision, hearing changes or sore throat Respiratory: has mild dyspnea, coughing, no wheezing CV: no chest pain, no palpitations GI: has nausea, vomiting, no abdominal pain, diarrhea, constipation GU: no dysuria, burning on urination, increased urinary frequency, hematuria  Ext: has mild leg edema Neuro: no unilateral weakness, numbness, or tingling, no vision change or hearing loss Skin: no rash, no skin tear. MSK: No muscle spasm, no deformity, no limitation of range of movement in spin Heme: No easy bruising.  Travel history: No recent long distant travel.  Allergy: No Known Allergies  Past Medical History:  Diagnosis Date  . Anxiety   . Cardiomyopathy (HCC)    45-50%  . CHF (congestive heart failure) (HCC)   . Chronic systolic heart failure (HCC) 07/2013   Ejection fraction of 45-50% with possible inferior wall hypokinesis  . Coronary artery disease    Significant left main and Severe three-vessel coronary artery disease in December of 2014. He underwent CABG in January of 2015 with LIMA to LAD, sequential SVG to ramus/OM 2 and SVG to OM 3  . Diabetes mellitus without complication (HCC)   . Dysrhythmia   . Hyperlipidemia   . Hypertension   . Medical non-compliance   . MI (myocardial infarction) (HCC)   . Obesity   . Tobacco abuse     Past Surgical History:  Procedure Laterality Date  . AMPUTATION Left 04/08/2020   Procedure: AMPUTATION RAY;  Surgeon: Rosetta Posner, DPM;  Location: [facility]  ORS;  Service: Podiatry;  Laterality: Left;  . AMPUTATION Left 06/25/2020   Procedure: AMPUTATION RAY 1ST RAY PARTIAL LEFT;  Surgeon: Rosetta Posner, DPM;  Location: [facility] ORS;  Service: Podiatry;  Laterality: Left;  . AMPUTATION TOE Right 02/29/2016   Procedure: AMPUTATION TOE;   Surgeon: Linus Galas, DPM;  Location: [facility] ORS;  Service: Podiatry;  Laterality: Right;  Great toe  . CARDIAC CATHETERIZATION     [facility] no stent  . CORONARY ARTERY BYPASS GRAFT N/A 09/16/2013   Procedure: CORONARY ARTERY BYPASS GRAFTING (CABG);  Surgeon: [name], MD;  Location[facility];  Service: Open Heart Surgery;  Laterality: N/A;  CABG x four, using left internal mammary artery and right leg greater saphenous vein  . INTRAOPERATIVE TRANSESOPHAGEAL ECHOCARDIOGRAM N/A 09/16/2013   Procedure: INTRAOPERATIVE TRANSESOPHAGEAL ECHOCARDIOGRAM;  Surgeon: [name], MD;  Location: [facility] OR;  Service: Open Heart Surgery;  Laterality: N/A;  . knee surgery Right   . LASIK Bilateral   . LOWER EXTREMITY ANGIOGRAPHY Left 04/07/2020   Procedure: Lower Extremity Angiography;  Surgeon: Renford Dills, MD;  Location: [facility] INVASIVE CV LAB;  Service: Cardiovascular;  Laterality: Left;  . LOWER EXTREMITY ANGIOGRAPHY Left 04/09/2020   Procedure: Lower Extremity Angiography (Pedal Approach);  Surgeon: Renford Dills, MD;  Location: [facility] INVASIVE CV LAB;  Service: Cardiovascular;  Laterality: Left;    Social History:  reports that he has been smoking cigarettes. He has a 22.50 pack-year smoking history. He has never used smokeless tobacco. He reports that he does not drink alcohol and does not use drugs.  Family History:  Family History  Problem Relation Age of Onset  . Heart disease Mother      Prior to Admission medications   Medication Sig Start Date End Date Taking? Authorizing Provider  amLODipine (NORVASC) 10 MG tablet Take 10 mg by mouth daily.    [provider]  aspirin EC 81 MG tablet Take 81 mg by mouth daily. Swallow whole.    [provider]  atorvastatin (LIPITOR) 80 MG tablet Take 80 mg by mouth every evening.  01/31/18   [provider]  calcium carbonate (TUMS - DOSED IN MG ELEMENTAL CALCIUM) 500 MG chewable tablet Chew 1 tablet by mouth daily as needed  for indigestion or heartburn.    [provider]  carvedilol (COREG) 25 MG tablet Take 1 tablet (25 mg total) by mouth 2 (two) times daily. 11/22/20   Darlin Priestly, MD  cloNIDine (CATAPRES) 0.3 MG tablet Take 0.3 mg by mouth 2 (two) times daily.  01/24/18   [provider]  glipiZIDE (GLUCOTROL XL) 10 MG 24 hr tablet Take 10 mg by mouth daily.  01/31/18   [provider]  insulin glargine (LANTUS SOLOSTAR) 100 UNIT/ML Solostar Pen Inject 62 Units into the skin daily.    [provider]  lisinopril (ZESTRIL) 40 MG tablet Hold until followup with your outpatient doctor due to elevated potassium and creatinine. 11/22/20   Darlin Priestly, MD  spironolactone (ALDACTONE) 50 MG tablet Hold until followup with your outpatient doctor due to elevated potassium and creatinine 11/22/20   Darlin Priestly, MD    Physical Exam: Vitals:   01/06/21 1317 01/06/21 1330 01/06/21 1430 01/06/21 1739  BP:  132/72 (!) 142/95 140/82  Pulse:  75 (!) 59 63  Resp:  18 11 18   Temp:    97.7 F (36.5 C)  TempSrc:    Oral  SpO2:  95% 93% 98%  Weight: 113.4  kg   123 kg  Height: 6\' 1"  (1.854 m)      General: Not in acute distress HEENT:       Eyes: PERRL, EOMI, no scleral icterus.       ENT: No discharge from the ears and nose, no pharynx injection, no tonsillar enlargement.        Neck: No JVD, no bruit, no mass felt. Heme: No neck lymph node enlargement. Cardiac: S1/S2, RRR, No murmurs, No gallops or rubs. Respiratory: No rales, wheezing, rhonchi or rubs. GI: Soft, nondistended, nontender, no rebound pain, no organomegaly, BS present. GU: No hematuria Ext: has trace leg edema bilaterally. 1+DP/PT pulse bilaterally. Musculoskeletal: No joint deformities, No joint redness or warmth, no limitation of ROM in spin. Skin: No rashes.  Neuro: Alert, oriented X3, cranial nerves II-XII grossly intact, moves all extremities normally. Psych: Patient is not psychotic, no suicidal or hemocidal  ideation.  Labs on Admission: I have personally reviewed following labs and imaging studies  CBC: Recent Labs  Lab 01/06/21 0812  WBC 11.3*  HGB 12.0*  HCT 35.1*  MCV 95.4  PLT 225   Basic Metabolic Panel: Recent Labs  Lab 01/06/21 0812 01/06/21 1134 01/06/21 1604  NA 126* 131*  --   K 7.3* 6.0* 5.7*  CL 95* 96*  --   CO2 25 26  --   GLUCOSE 193* 184*  --   BUN 34* 33*  --   CREATININE 1.41* 1.29*  --   CALCIUM 10.1 10.7*  --    GFR: Estimated Creatinine Clearance: 82.6 mL/min (A) (by C-G formula based on SCr of 1.29 mg/dL (H)). Liver Function Tests: Recent Labs  Lab 01/06/21 0812  AST 17  ALT 25  ALKPHOS 74  BILITOT 1.0  PROT 7.9  ALBUMIN 4.7   Recent Labs  Lab 01/06/21 0812  LIPASE 26   No results for input(s): AMMONIA in the last 168 hours. Coagulation Profile: No results for input(s): INR, PROTIME in the last 168 hours. Cardiac Enzymes: No results for input(s): CKTOTAL, CKMB, CKMBINDEX, TROPONINI in the last 168 hours. BNP (last 3 results) No results for input(s): PROBNP in the last 8760 hours. HbA1C: No results for input(s): HGBA1C in the last 72 hours. CBG: Recent Labs  Lab 01/06/21 0807 01/06/21 1218 01/06/21 1640  GLUCAP 189* 176* 132*   Lipid Profile: No results for input(s): CHOL, HDL, LDLCALC, TRIG, CHOLHDL, LDLDIRECT in the last 72 hours. Thyroid Function Tests: No results for input(s): TSH, T4TOTAL, FREET4, T3FREE, THYROIDAB in the last 72 hours. Anemia Panel: No results for input(s): VITAMINB12, FOLATE, FERRITIN, TIBC, IRON, RETICCTPCT in the last 72 hours. Urine analysis:    Component Value Date/Time   COLORURINE YELLOW (A) 01/06/2021 1200   APPEARANCEUR CLEAR (A) 01/06/2021 1200   APPEARANCEUR Clear 09/18/2014 2018   LABSPEC 1.013 01/06/2021 1200   LABSPEC 1.005 09/18/2014 2018   PHURINE 7.0 01/06/2021 1200   GLUCOSEU 50 (A) 01/06/2021 1200   GLUCOSEU 150 mg/dL 96/04/540901/15/2016 81192018   HGBUR NEGATIVE 01/06/2021 1200    BILIRUBINUR NEGATIVE 01/06/2021 1200   BILIRUBINUR Negative 09/18/2014 2018   KETONESUR NEGATIVE 01/06/2021 1200   PROTEINUR NEGATIVE 01/06/2021 1200   UROBILINOGEN 0.2 09/12/2013 1555   NITRITE NEGATIVE 01/06/2021 1200   LEUKOCYTESUR NEGATIVE 01/06/2021 1200   LEUKOCYTESUR Negative 09/18/2014 2018   Sepsis Labs: @LABRCNTIP (procalcitonin:4,lacticidven:4) ) Recent Results (from the past 240 hour(s))  Resp Panel by RT-PCR (Flu A&B, Covid) Nasopharyngeal Swab     Status: None   Collection  Time: 01/06/21 11:20 AM   Specimen: Nasopharyngeal Swab; Nasopharyngeal(NP) swabs in vial transport medium  Result Value Ref Range Status   SARS Coronavirus 2 by RT PCR NEGATIVE NEGATIVE Final    Comment: (NOTE) SARS-CoV-2 target nucleic acids are NOT DETECTED.  The SARS-CoV-2 RNA is generally detectable in upper respiratory specimens during the acute phase of infection. The lowest concentration of SARS-CoV-2 viral copies this assay can detect is 138 copies/mL. A negative result does not preclude SARS-Cov-2 infection and should not be used as the sole basis for treatment or other patient management decisions. A negative result may occur with  improper specimen collection/handling, submission of specimen other than nasopharyngeal swab, presence of viral mutation(s) within the areas targeted by this assay, and inadequate number of viral copies(<138 copies/mL). A negative result must be combined with clinical observations, patient history, and epidemiological information. The expected result is Negative.  Fact Sheet for Patients:  BloggerCourse.com  Fact Sheet for Healthcare Providers:  SeriousBroker.it  This test is no t yet approved or cleared by the [place] FDA and  has been authorized for detection and/or diagnosis of SARS-CoV-2 by FDA under an Emergency Use Authorization (EUA). This EUA will remain  in effect (meaning this test can be  used) for the duration of the COVID-19 declaration under Section 564(b)(1) of the Act, 21 U.S.C.section 360bbb-3(b)(1), unless the authorization is terminated  or revoked sooner.       Influenza A by PCR NEGATIVE NEGATIVE Final   Influenza B by PCR NEGATIVE NEGATIVE Final    Comment: (NOTE) The Xpert Xpress SARS-CoV-2/FLU/RSV plus assay is intended as an aid in the diagnosis of influenza from Nasopharyngeal swab specimens and should not be used as a sole basis for treatment. Nasal washings and aspirates are unacceptable for Xpert Xpress SARS-CoV-2/FLU/RSV testing.  Fact Sheet for Patients: BloggerCourse.com  Fact Sheet for Healthcare Providers: SeriousBroker.it  This test is not yet approved or cleared by the [place] FDA and has been authorized for detection and/or diagnosis of SARS-CoV-2 by FDA under an Emergency Use Authorization (EUA). This EUA will remain in effect (meaning this test can be used) for the duration of the COVID-19 declaration under Section 564(b)(1) of the Act, 21 U.S.C. section 360bbb-3(b)(1), unless the authorization is terminated or revoked.  Performed at [facility], [address] [place], [place] [address]      Radiological Exams on Admission: CT ABDOMEN PELVIS WO CONTRAST  Result Date: 01/06/2021 CLINICAL DATA:  Abdominal pain with nausea and vomiting EXAM: CT ABDOMEN AND PELVIS WITHOUT CONTRAST TECHNIQUE: Multidetector CT imaging of the abdomen and pelvis was performed following the standard protocol without IV contrast. COMPARISON:  None. FINDINGS: Lower chest: Lung bases are free of acute infiltrate or sizable effusion. Hepatobiliary: Liver is unremarkable. Gallbladder demonstrates a single dependent gallstone although no complicating factors are seen. Pancreas: Unremarkable. No pancreatic ductal dilatation or surrounding inflammatory changes. Spleen: Normal in size without focal  abnormality. Adrenals/Urinary Tract: Adrenal glands are within normal limits. The kidneys demonstrate a normal appearance bilaterally. No definitive renal calculi are seen. Renal vascular calcifications are noted. The ureters are within normal limits bilaterally. The bladder is well distended. Stomach/Bowel: Scattered diverticular change of the colon is noted. No obstructive or inflammatory changes are seen in the colon. The appendix is unremarkable. Small bowel and stomach are unremarkable as well. Vascular/Lymphatic: Atherosclerotic calcifications are noted as well as changes of prior chronic aortic dissection without aneurysmal dilatation. No sizable adenopathy is identified. Reproductive: Prostate is  unremarkable. Other: No abdominal wall hernia or abnormality. No abdominopelvic ascites. Musculoskeletal: No acute or significant osseous findings. IMPRESSION: Cholelithiasis without complicating factors. Diverticulosis without diverticulitis. No other focal abnormality is noted. Electronically Signed   By: Alcide Clever M.D.   On: 01/06/2021 15:35   DG Chest Portable 1 View  Result Date: 01/06/2021 CLINICAL DATA:  Onset nausea last night.  Dizziness and weakness. EXAM: PORTABLE CHEST 1 VIEW COMPARISON:  Single-view of the chest 09/18/2014. FINDINGS: The patient is status post CABG. There is cardiomegaly. Aortic atherosclerosis is noted. Lungs are clear. No pneumothorax or pleural effusion. 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 IMPRESSION: No acute disease. Cardiomegaly. Aortic Atherosclerosis (ICD10-I70.0). Electronically Signed   By: Drusilla Kanner M.D.   On: 01/06/2021 10:10     EKG: I have personally reviewed.  Atrial fibrillation, QTC 454, RAD, poor R wave progression, right bundle blockage   Assessment/Plan Principal Problem:   Hyperkalemia Active Problems:   Chronic systolic heart failure (HCC)   Hypertension   Hyperlipidemia   CAD (coronary artery disease)   Atrial  fibrillation (HCC)   Type II diabetes mellitus with renal manifestations (HCC)   Tobacco abuse   CKD (chronic kidney disease), stage IIIa   Hyponatremia   Elevated troponin   Nausea & vomiting   Hyperkalemia: Potassium 7.3. Patient was treated with 5 unit of NovoLog, D50, 50 mEq of sodium bicarbonate, 1 g of calcium gluconate in ED.  Patient was also given 15 g of Kayexalate.  The repeated labs showed potassium of 5.7.  -Placed on progressive bed for observation -Will give 10 g of Lokelma  -IVF  Hyponatremia: Sodium 126.  Mental status okay.  Likely due to decreased oral intake and GI loss secondary to nausea vomiting.  Patient was treated with normal saline at 75 cc/h initially, the repeated BMP showed sodium 131.  -DC IV fluid -Continue IV fluid restriction -hold lisinopril and spironolactone - Will check urine sodium, urine osmolality, serum osmolality. - f/u by BMP q8h  Chronic systolic heart failure (HCC): 2D echo on 06/22/2020 showed EF of 40-50% with grade 1 diastolic dysfunction.  Patient has leg edema, elevated BNP 441, but no pulmonary edema by chest x-ray.  No oxygen desaturation and no worsening shortness of breath.  Does not seem to have CHF exacerbation currently, but he is at high risk of developing CHF exacerbation -Hold off spironolactone due to hyponatremia and hyperkalemia  Hypertension -Hold lisinopril due to hyperkalemia -Amlodipine, Coreg, clonidine, -IV hydralazine as needed  Hyperlipidemia -Lipitor  CAD (coronary artery disease) and elevated troponin: Troponin level 18, 18, 17, already normalized.  No chest pain.  Most likely due to demand ischemia. -Continue aspirin, Lipitor -check A1c, FLP  Atrial fibrillation (HCC): Patient not taking anticoagulants currently -Continue Coreg -Follow-up with cardiology  Type II diabetes mellitus with renal manifestations [facility]): Recent A1c 8.0, poorly controlled.  Patient is taking glipizide and Lantus -Sliding scale  insulin -Decrease Lantus dose from 60 to 42 units daily  Tobacco abuse -Nicotine patch  CKD (chronic kidney disease), stage IIIa: Stable and at baseline -Follow-up with BMP  Nausea & vomiting: Etiology is not clear.  CT of abdomen/pelvis is not impressive.  Lipase 26.  Denies diarrhea and abdominal pain. -Supportive care -As needed Zofran         DVT ppx:  SQ Lovenox Code Status: Full code Family Communication:  Yes, patient's daughter-in-law   at bed side Disposition Plan:  Anticipate discharge back to previous environment  Consults called: None Admission status and Level of care: Progressive Cardiac:   For obs    Status is: Observation  The patient remains OBS appropriate and will d/c before 2 midnights.  Dispo: The patient is from: Home              Anticipated d/c is to: Home              Patient currently is not medically stable to d/c.   Difficult to place patient No          Date of Service 01/06/2021    Lorretta Harp [facility]   If 7PM-7AM, please contact night-coverage [email or web address] 01/06/2021, 5:57 PM

## 2021-01-06 NOTE — Plan of Care (Signed)

## 2021-01-07 DIAGNOSIS — N183 Chronic kidney disease, stage 3 unspecified: Secondary | ICD-10-CM

## 2021-01-07 DIAGNOSIS — E875 Hyperkalemia: Secondary | ICD-10-CM | POA: Diagnosis not present

## 2021-01-07 DIAGNOSIS — I2510 Atherosclerotic heart disease of native coronary artery without angina pectoris: Secondary | ICD-10-CM | POA: Diagnosis not present

## 2021-01-07 DIAGNOSIS — I480 Paroxysmal atrial fibrillation: Secondary | ICD-10-CM | POA: Diagnosis not present

## 2021-01-07 DIAGNOSIS — I5022 Chronic systolic (congestive) heart failure: Secondary | ICD-10-CM

## 2021-01-07 LAB — LIPID PANEL
Cholesterol: 80 mg/dL (ref 0–200)
HDL: 26 mg/dL — ABNORMAL LOW (ref >40)
LDL Cholesterol: 40 mg/dL (ref 0–99)
Total CHOL/HDL Ratio: 3.1 RATIO
Triglycerides: 69 mg/dL (ref <150)
VLDL: 14 mg/dL (ref 0–40)

## 2021-01-07 LAB — BASIC METABOLIC PANEL
Anion gap: 7 (ref 5–15)
Anion gap: 7 (ref 5–15)
BUN: 26 mg/dL — ABNORMAL HIGH (ref 8–23)
BUN: 27 mg/dL — ABNORMAL HIGH (ref 8–23)
CO2: 23 mmol/L (ref 22–32)
CO2: 25 mmol/L (ref 22–32)
Calcium: 9.7 mg/dL (ref 8.9–10.3)
Calcium: 10.5 mg/dL — ABNORMAL HIGH (ref 8.9–10.3)
Chloride: 98 mmol/L (ref 98–111)
Chloride: 98 mmol/L (ref 98–111)
Creatinine, Ser: 1.29 mg/dL — ABNORMAL HIGH (ref 0.61–1.24)
Creatinine, Ser: 1.31 mg/dL — ABNORMAL HIGH (ref 0.61–1.24)
GFR, Estimated: >60 mL/min (ref >60)
GFR, Estimated: >60 mL/min (ref >60)
Glucose, Bld: 143 mg/dL — ABNORMAL HIGH (ref 70–99)
Glucose, Bld: 70 mg/dL (ref 70–99)
Potassium: 4.9 mmol/L (ref 3.5–5.1)
Potassium: 4.8 mmol/L (ref 3.5–5.1)
Sodium: 128 mmol/L — ABNORMAL LOW (ref 135–145)
Sodium: 130 mmol/L — ABNORMAL LOW (ref 135–145)

## 2021-01-07 LAB — HEMOGLOBIN A1C
Hgb A1c MFr Bld: 7.8 % — ABNORMAL HIGH (ref 4.8–5.6)
Mean Plasma Glucose: 177.16 mg/dL

## 2021-01-07 LAB — CBC
HCT: 32.3 % — ABNORMAL LOW (ref 39.0–52.0)
Hemoglobin: 10.8 g/dL — ABNORMAL LOW (ref 13.0–17.0)
MCH: 32.3 pg (ref 26.0–34.0)
MCHC: 33.4 g/dL (ref 30.0–36.0)
MCV: 96.7 fL (ref 80.0–100.0)
Platelets: 199 10*3/uL (ref 150–400)
RBC: 3.34 MIL/uL — ABNORMAL LOW (ref 4.22–5.81)
RDW: 13.2 % (ref 11.5–15.5)
WBC: 9.2 10*3/uL (ref 4.0–10.5)
nRBC: 0 % (ref 0.0–0.2)

## 2021-01-07 LAB — GLUCOSE, CAPILLARY
Glucose-Capillary: 119 mg/dL — ABNORMAL HIGH (ref 70–99)
Glucose-Capillary: 86 mg/dL (ref 70–99)

## 2021-01-07 MED ORDER — CARVEDILOL 12.5 MG PO TABS: 12.5000 mg | ORAL_TABLET | Freq: Two times a day (BID) | ORAL | 3 refills | Status: DC

## 2021-01-07 MED ORDER — ONDANSETRON 4 MG PO TBDP: 4.0000 mg | ORAL_TABLET | Freq: Three times a day (TID) | ORAL | 0 refills | Status: DC | PRN

## 2021-01-07 MED ORDER — CARVEDILOL 12.5 MG PO TABS
12.5000 mg | ORAL_TABLET | Freq: Two times a day (BID) | ORAL | Status: DC
  Administered 2021-01-07: 12.5 mg via ORAL

## 2021-01-07 MED ORDER — FUROSEMIDE 20 MG PO TABS: 20.0000 mg | ORAL_TABLET | Freq: Every day | ORAL | 11 refills | Status: DC | PRN

## 2021-01-07 MED ORDER — LANTUS SOLOSTAR 100 UNIT/ML ~~LOC~~ SOPN: 42.0000 [IU] | PEN_INJECTOR | Freq: Every day | SUBCUTANEOUS | 11 refills | Status: AC

## 2021-01-07 MED ORDER — PANTOPRAZOLE SODIUM 40 MG PO TBEC: 40.0000 mg | DELAYED_RELEASE_TABLET | Freq: Every day | ORAL | 3 refills | Status: DC

## 2021-01-07 NOTE — Progress Notes (Signed)
Patient discharged per orders PIVs/tele removed from patient. Discharge instructions provided; pt agreeable. Taken down to ride by volunteer via wheelchair.

## 2021-01-07 NOTE — Progress Notes (Signed)
Central telemetry called - pt HR down into 30s but went back up.    I checked monitors - pt HR back down to 30s and 40s.  I checked on patient - asymptomatic.  Reports he is often bradycardic due to beta blocker.

## 2021-01-07 NOTE — Plan of Care (Signed)

## 2021-01-07 NOTE — Discharge Summary (Signed)
Physician Discharge Summary   Patient ID: Marc [name] MRN: [id number] DOB/AGE: [birth year] 62 y.o.  Admit date: 01/06/2021 Discharge date: 01/07/2021  Primary Care Physician:  Center, [facility]   Recommendations for Outpatient Follow-up:  1. Follow up with PCP in 1-2 weeks 2. Please obtain BMP at followup 3. Lisinopril, spironolactone discontinued, given the recurrent admission for severe hyperkalemia,  will need to be reviewed by his cardiologist at follow-up 4. Coreg decreased to 12.5 mg twice daily 5. Lantus decreased to 42 units daily  Home Health: Ambulating without any difficulty Equipment/Devices:   Discharge Condition: stable  CODE STATUS: FULL Diet recommendation: Carb modified diet   Discharge Diagnoses:    . Hyperkalemia . Atrial fibrillation (HCC) . CAD (coronary artery disease) . Chronic systolic heart failure (HCC) . Hyperlipidemia . Hypertension . Type II diabetes mellitus with renal manifestations (HCC) . Tobacco abuse . CKD (chronic kidney disease), stage IIIa . 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 Elevated troponin . Nausea & vomiting resolved likely due to gastroenteritis   Consults: None    Allergies:  No Known Allergies   DISCHARGE MEDICATIONS: Allergies as of 01/07/2021   No Known Allergies     Medication List    STOP taking these medications   lisinopril 40 MG tablet Commonly known as: ZESTRIL   spironolactone 50 MG tablet Commonly known as: ALDACTONE     TAKE these medications   amLODipine 10 MG tablet Commonly known as: NORVASC Take 10 mg by mouth daily.   aspirin EC 81 MG tablet Take 81 mg by mouth daily. Swallow whole.   atorvastatin 80 MG tablet Commonly known as: LIPITOR Take 80 mg by mouth every evening.   calcium carbonate 500 MG chewable tablet Commonly known as: TUMS - dosed in mg elemental calcium Chew 1 tablet by mouth daily as needed for indigestion or heartburn.   carvedilol 12.5 MG  tablet Commonly known as: COREG Take 1 tablet (12.5 mg total) by mouth 2 (two) times daily. What changed:   medication strength  how much to take   cloNIDine 0.3 MG tablet Commonly known as: CATAPRES Take 0.3 mg by mouth 2 (two) times daily.   furosemide 20 MG tablet Commonly known as: Lasix Take 1 tablet (20 mg total) by mouth daily as needed for fluid or edema (shortness of breath).   glipiZIDE 10 MG 24 hr tablet Commonly known as: GLUCOTROL XL Take 10 mg by mouth daily.   Lantus SoloStar 100 UNIT/ML Solostar Pen Generic drug: insulin glargine Inject 42 Units into the skin daily. What changed: how much to take        Brief H and P: For complete details please refer to admission H and P, but in brief patient is a 60 year old male with hypertension, hyperlipidemia, diabetes mellitus, type II, insulin-dependent, tobacco use, CAD, CABG, CHF with EF of 40 to 50%, history of prior ischemic colitis, A. fib not on anticoagulation, CKD stage IIIa, presented with nausea, vomiting and weakness for past 3 days prior to admission.  Patient reported multiple episodes of nausea and vomiting.  Patient reported no diarrhea or abdominal pain, fevers.  He had mild shortness of breath and mild cough.  Reported generalized weakness and lightheadedness.  No focal weakness.  No facial droop or slurred speech.  Patient also reported acid reflux symptoms. He was admitted in 11/2020 for hyperkalemia.  In ED, BNP 441, potassium 7.3, sodium 126, creatinine 1.4, chloride 25  Hospital Course:     Hyperkalemia -Potassium  was 7.3 at the time of admission, was treated with 5 units of NovoLog, D50, 50 mEq of sodium bicarb, 1 g calcium gluconate and Kayexalate -Potassium is now improving, 4.8 at the time of discharge -Patient strongly instructed to discontinue Aldactone.  Lisinopril also placed on hold due to acute kidney injury the time of admission with creatinine 1.4 -Recommended strongly to follow-up with  his cardiologist and adjustment of his cardiac medications     Chronic systolic heart failure (HCC) -At the time of admission, was somewhat hypovolemic with ongoing nausea and vomiting -Patient received gentle hydration.  Lisinopril, spironolactone was held -Creatinine was 1.4 at the time of admission, improved to 1.3 at the time of discharge -Outpatient follow-up with his cardiologist -For now recommended Lasix as needed for shortness of breath, fluid/edema.  Patient will follow up with his cardiologist for adjustment in his cardiac medications  CKD stage IIIa -Baseline creatinine 1.4-1.6 -Patient was admitted with a creatinine of 1.4, had ongoing nausea and vomiting.  Patient gently hydrated, creatinine 1.3 at the time of discharge - Lisinopril, Aldactone discontinued    Hypertension -Continue clonidine, amlodipine -Coreg decreased to 12.5 mg twice daily due to bradycardia -Discontinued lisinopril, Aldactone    Hyperlipidemia, history of CAD -Continue statin     Atrial fibrillation (HCC) -Heart rate controlled, has bradycardia hence Coreg decreased to 12.5 mg twice daily -Not on anticoagulation. -Recommended outpatient follow-up with his cardiologist    Type II diabetes mellitus with renal manifestations (HCC) -Hemoglobin A1c 7.8 -CBGs stable, while in the hospital, patient was given Lantus reduced dose 42 units and CBGs remained stable hence Lantus also reduced as patient is also on glipizide    Tobacco abuse -Counseled strongly on smoking cessation  Nausea and vomiting, likely due to gastroenteritis, gastritis -Patient also reported symptoms of gastritis, acid reflux, placed on PPI -Currently no nausea or vomiting and tolerating solid diet without any difficulty  Acute on chronic hyponatremia -Presented with sodium of 126, baseline sodium around 130 -Initially given gentle hydration due to ongoing nausea and vomiting, poor p.o. intake -Serum osmolality 284, urine  osmolality 464, appears to be SIADH -IV fluids were discontinued At time of discharge, sodium improved to 130   Day of Discharge S: No acute complaints, tolerating diet, hoping to go home today.  No nausea or vomiting.  BP 107/65 (BP Location: Left Arm)   Pulse (!) 57   Temp 98 F (36.7 C) (Oral)   Resp 18   Ht 6\' 1"  (1.854 m)   Wt 122.7 kg   SpO2 100%   BMI 35.69 kg/m   Physical Exam: General: Alert and awake oriented x3 not in any acute distress. CVS: S1-S2 clear no murmur rubs or gallops Chest: clear to auscultation bilaterally, no wheezing rales or rhonchi Abdomen: soft nontender, nondistended, normal bowel sounds Extremities: no cyanosis, clubbing or edema noted bilaterally Neuro: Cranial nerves II-XII intact, no focal neurological deficits    Get Medicines reviewed and adjusted: Please take all your medications with you for your next visit with your Primary MD  Please request your Primary MD to go over all hospital tests and procedure/radiological results at the follow up. Please ask your Primary MD to get all Hospital records sent to his/her office.  If you experience worsening of your admission symptoms, develop shortness of breath, life threatening emergency, suicidal or homicidal thoughts you must seek medical attention immediately by calling 911 or calling your MD immediately  if symptoms less severe.  You must read  complete instructions/literature along with all the possible adverse reactions/side effects for all the Medicines you take and that have been prescribed to you. Take any new Medicines after you have completely understood and accept all the possible adverse reactions/side effects.   Do not drive when taking pain medications.   Do not take more than prescribed Pain, Sleep and Anxiety Medications  Special Instructions: If you have smoked or chewed Tobacco  in the last 2 yrs please stop smoking, stop any regular Alcohol  and or any Recreational drug  use.  Wear Seat belts while driving.  Please note  You were cared for by a hospitalist during your hospital stay. Once you are discharged, your primary care physician will handle any further medical issues. Please note that NO REFILLS for any discharge medications will be authorized once you are discharged, as it is imperative that you return to your primary care physician (or establish a relationship with a primary care physician if you do not have one) for your aftercare needs so that they can reassess your need for medications and monitor your lab values.   The results of significant diagnostics from this hospitalization (including imaging, microbiology, ancillary and laboratory) are listed below for reference.      Procedures/Studies:  CT ABDOMEN PELVIS WO CONTRAST  Result Date: 01/06/2021 CLINICAL DATA:  Abdominal pain with nausea and vomiting EXAM: CT ABDOMEN AND PELVIS WITHOUT CONTRAST TECHNIQUE: Multidetector CT imaging of the abdomen and pelvis was performed following the standard protocol without IV contrast. COMPARISON:  None. FINDINGS: Lower chest: Lung bases are free of acute infiltrate or sizable effusion. Hepatobiliary: Liver is unremarkable. Gallbladder demonstrates a single dependent gallstone although no complicating factors are seen. Pancreas: Unremarkable. No pancreatic ductal dilatation or surrounding inflammatory changes. Spleen: Normal in size without focal abnormality. Adrenals/Urinary Tract: Adrenal glands are within normal limits. The kidneys demonstrate a normal appearance bilaterally. No definitive renal calculi are seen. Renal vascular calcifications are noted. The ureters are within normal limits bilaterally. The bladder is well distended. Stomach/Bowel: Scattered diverticular change of the colon is noted. No obstructive or inflammatory changes are seen in the colon. The appendix is unremarkable. Small bowel and stomach are unremarkable as well. 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 No sizable adenopathy is identified. Reproductive: Prostate is unremarkable. Other: No abdominal wall hernia or abnormality. No abdominopelvic ascites. Musculoskeletal: No acute or significant osseous findings. IMPRESSION: Cholelithiasis without complicating factors. Diverticulosis without diverticulitis. No other focal abnormality is noted. Electronically Signed   By: Alcide Clever M.D.   On: 01/06/2021 15:35   DG Chest Portable 1 View  Result Date: 01/06/2021 CLINICAL DATA:  Onset nausea last night.  Dizziness and weakness. EXAM: PORTABLE CHEST 1 VIEW COMPARISON:  Single-view of the chest 09/18/2014. FINDINGS: The patient is status post CABG. There is cardiomegaly. Aortic atherosclerosis is noted. Lungs are clear. No pneumothorax or pleural effusion. 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 IMPRESSION: No acute disease. Cardiomegaly. Aortic Atherosclerosis (ICD10-I70.0). Electronically Signed   By: Drusilla Kanner M.D.   On: 01/06/2021 10:10       LAB RESULTS: Basic Metabolic Panel: Recent Labs  Lab 01/07/21 0303 01/07/21 0839  NA 128* 130*  K 4.9 4.8  CL 98 98  CO2 23 25  GLUCOSE 143* 70  BUN 26* 27*  CREATININE 1.29* 1.31*  CALCIUM 9.7 10.5*   Liver Function Tests: Recent Labs  Lab 01/06/21 [phone]  AST 17  ALT 25  ALKPHOS 74  BILITOT 1.0  PROT 7.9  ALBUMIN 4.7   Recent Labs  Lab 01/06/21 0812  LIPASE 26   No results for input(s): AMMONIA in the last 168 hours. CBC: Recent Labs  Lab 01/06/21 0812 01/07/21 0303  WBC 11.3* 9.2  HGB 12.0* 10.8*  HCT 35.1* 32.3*  MCV 95.4 96.7  PLT 225 199   Cardiac Enzymes: No results for input(s): CKTOTAL, CKMB, CKMBINDEX, TROPONINI in the last 168 hours. BNP: Invalid input(s): POCBNP CBG: Recent Labs  Lab 01/07/21 0424 01/07/21 0739  GLUCAP 119* 86       Disposition and  Follow-up: Discharge Instructions    (HEART FAILURE PATIENTS) Call MD:  Anytime you have any of the following symptoms: 1) 3 pound weight gain in 24 hours or 5 pounds in 1 week 2) shortness of breath, with or without a dry hacking cough 3) swelling in the hands, feet or stomach 4) if you have to sleep on extra pillows at night in order to breathe.   Complete by: As directed    Diet - low sodium heart healthy   Complete by: As directed    Discharge instructions   Complete by: As directed    Please STOP lisinopril and spironolactone. Note that Lantus and Coreg (beta blocker) is decreased. Follow up with your cardiologist in 10-14 days.   Increase activity slowly   Complete by: As directed        DISPOSITION: home    DISCHARGE FOLLOW-UP  Follow-up Information    Center, [facility]. Schedule an appointment as soon as possible for a visit in 2 week(s).   Specialty: General Practice Contact information: [address] [place] [address] [phone]        Debbe Odea, MD. Schedule an appointment as soon as possible for a visit in 2 week(s).   Specialties: Cardiology, Radiology Contact information: [address] [place] [address] [phone]                Time coordinating discharge:    Signed:   Thad Ranger M.D. [facility] 01/07/2021, 10:25 AM

## 2021-01-21 ENCOUNTER — Ambulatory Visit (INDEPENDENT_AMBULATORY_CARE_PROVIDER_SITE_OTHER): Payer: Medicaid Other | Admitting: Nurse Practitioner

## 2021-01-21 ENCOUNTER — Encounter: Payer: Self-pay | Admitting: Nurse Practitioner

## 2021-01-21 ENCOUNTER — Other Ambulatory Visit: Payer: Self-pay

## 2021-01-21 VITALS — BP 124/70 | HR 70 | Ht 73.0 in | Wt 269.0 lb

## 2021-01-21 DIAGNOSIS — E875 Hyperkalemia: Secondary | ICD-10-CM

## 2021-01-21 DIAGNOSIS — I2510 Atherosclerotic heart disease of native coronary artery without angina pectoris: Secondary | ICD-10-CM

## 2021-01-21 DIAGNOSIS — Z720 Tobacco use: Secondary | ICD-10-CM

## 2021-01-21 DIAGNOSIS — I10 Essential (primary) hypertension: Secondary | ICD-10-CM | POA: Diagnosis not present

## 2021-01-21 DIAGNOSIS — I4819 Other persistent atrial fibrillation: Secondary | ICD-10-CM

## 2021-01-21 DIAGNOSIS — I739 Peripheral vascular disease, unspecified: Secondary | ICD-10-CM

## 2021-01-21 DIAGNOSIS — N179 Acute kidney failure, unspecified: Secondary | ICD-10-CM

## 2021-01-21 DIAGNOSIS — I4891 Unspecified atrial fibrillation: Secondary | ICD-10-CM

## 2021-01-21 DIAGNOSIS — I5020 Unspecified systolic (congestive) heart failure: Secondary | ICD-10-CM

## 2021-01-21 DIAGNOSIS — E785 Hyperlipidemia, unspecified: Secondary | ICD-10-CM

## 2021-01-21 DIAGNOSIS — I255 Ischemic cardiomyopathy: Secondary | ICD-10-CM

## 2021-01-21 MED ORDER — APIXABAN 5 MG PO TABS: 5.0000 mg | ORAL_TABLET | Freq: Two times a day (BID) | ORAL | 1 refills | Status: DC

## 2021-01-21 NOTE — Progress Notes (Signed)
Office Visit    Patient Name: Marc [name] Date of Encounter: 01/21/2021  Primary Care Provider:  [facility] Primary Cardiologist:  Debbe Odea, MD  Chief Complaint    60 year old male with a history of coronary artery disease status post coronary artery bypass grafting in 2014, chronic systolic and diastolic congestive heart failure, ischemic cardiomyopathy, hypertension, hyperlipidemia, peripheral arterial disease, tobacco abuse, obesity, and diabetes, presents for follow-up after recent hospitalizations for hyperkalemia requiring discontinuation of ACE inhibitor and spironolactone therapy.  Past Medical History    Past Medical History:  Diagnosis Date  . Anxiety   . Chronic systolic heart failure (HCC)    a. 07/2013 Echo: Ejection fraction of 45-50% with possible inferior wall hypokinesis; b. 03/2018 Echo: EF 40-45%; c. 06/2020 Echo: EF 40-45%, glob HK. Mod LVH, gr1 DD. Nl PASP. Mild BAE. Triv MR. Mild AoV sclerosis. Mild Ao dil (60mm).  . Coronary artery disease    Significant left main and Severe three-vessel coronary artery disease in December of 2014. He underwent CABG in January of 2015 with LIMA to LAD, sequential SVG to ramus/OM 2 and SVG to OM 3  . Diabetes mellitus without complication (HCC)   . Hyperlipidemia   . Hypertension   . Ischemic cardiomyopathy    a. 07/2013 Echo: EF 45-50%; b. 03/2018 Echo: EF 40-45%; c. 06/2020 Echo: EF 40-45%.  . Medical non-compliance   . MI (myocardial infarction) (HCC)   . Obesity   . PAD (peripheral artery disease) (HCC)    a. 04/2020 PTA of L PT.  Marland Kitchen PAF (paroxysmal atrial fibrillation) (HCC)   . Tobacco abuse    Past Surgical History:  Procedure Laterality Date  . AMPUTATION Left 04/08/2020   Procedure: AMPUTATION RAY;  Surgeon: Rosetta Posner, DPM;  Location: [facility] ORS;  Service: Podiatry;  Laterality: Left;  . AMPUTATION Left 06/25/2020   Procedure: AMPUTATION RAY 1ST RAY PARTIAL LEFT;  Surgeon: Rosetta Posner, DPM;  Location: [facility] ORS;  Service: Podiatry;  Laterality: Left;  . AMPUTATION TOE Right 02/29/2016   Procedure: AMPUTATION TOE;  Surgeon: Linus Galas, DPM;  Location: [facility] ORS;  Service: Podiatry;  Laterality: Right;  Great toe  . CARDIAC CATHETERIZATION     [facility] no stent  . CORONARY ARTERY BYPASS GRAFT N/A 09/16/2013   Procedure: CORONARY ARTERY BYPASS GRAFTING (CABG);  Surgeon: [name], MD;  Location: [facility] OR;  Service: Open Heart Surgery;  Laterality: N/A;  CABG x four, using left internal mammary artery and right leg greater saphenous vein  . INTRAOPERATIVE TRANSESOPHAGEAL ECHOCARDIOGRAM N/A 09/16/2013   Procedure: INTRAOPERATIVE TRANSESOPHAGEAL ECHOCARDIOGRAM;  Surgeon: [name], MD;  Location: [facility] OR;  Service: Open Heart Surgery;  Laterality: N/A;  . knee surgery Right   . LASIK Bilateral   . LOWER EXTREMITY ANGIOGRAPHY Left 04/07/2020   Procedure: Lower Extremity Angiography;  Surgeon: Renford Dills, MD;  Location: [facility] INVASIVE CV LAB;  Service: Cardiovascular;  Laterality: Left;  . LOWER EXTREMITY ANGIOGRAPHY Left 04/09/2020   Procedure: Lower Extremity Angiography (Pedal Approach);  Surgeon: Renford Dills, MD;  Location: [facility] INVASIVE CV LAB;  Service: Cardiovascular;  Laterality: Left;    Allergies  No Known Allergies  History of Present Illness    60 year old male with the above complex past medical history including coronary artery disease, chronic systolic and diastolic congestive heart failure, ischemic cardiomyopathy, hyperlipidemia, hypertension, peripheral arterial disease, tobacco abuse, obesity, and diabetes.  In December 2014, he suffered a myocardial infarction and  was found to have severe multivessel coronary artery disease and subsequently underwent CABG x4 in January 2015.  EF at the time was 45 to 50%.  Subsequent echoes have shown slight worsening though relatively stable EF of 40-45%, the last of which was performed in October 2021.  He  was last seen in cardiology clinic in October 2021, at which time he was evaluated for preoperative clearance pending left foot surgery.  He was doing well from a cardiac standpoint at that time.  In March of this year, patient was admitted to [facility] regional with generalized weakness and found to be hyperkalemic with a potassium greater than 7.5.  This was felt to be secondary to a combination of TMP-SMX with spironolactone and lisinopril.  He was treated with Kayexalate and advised to hold his lisinopril and spironolactone.  He was readmitted May 5 with nausea and vomiting.  He was again found to be hyperkalemic with a potassium of 7.3 requiring insulin, D50, calcium gluconate, and Kayexalate.  He said that he resume to spironolactone and lisinopril because he was unclear as to how long he was supposed to hold it.  These were again held.  His creatinine was elevated above baseline at 1.41 and he was advised to use Lasix on an as-needed basis only.  He was discharged home May 6.  Of note, he was found to be in atrial fibrillation during admission.  He was not placed on oral anticoagulation.  Since his hospitalization, he says that he has felt on edge.  He has had trouble sleeping related to anxiety, which is not necessarily new but does seem to be worse.  He has not been having any chest pain.  He has chronic, stable dyspnea on exertion and also notes some dyspnea at rest, which she attributes to feeling anxious.  He denies palpitations, PND, orthopnea, dizziness, syncope, or early satiety.  He has chronic, mild lower extremity swelling, which he notes is stable.  He has been taking his Lasix 20 mg every day.  He did have follow-up labs at primary care on May 10 showing a creatinine 1.58 and potassium of 5.5.  No changes were made to his regimen after this finding.  Home Medications    Prior to Admission medications   Medication Sig Start Date End Date Taking? Authorizing Provider  amLODipine (NORVASC) 10  MG tablet Take 10 mg by mouth daily.    [provider]  aspirin EC 81 MG tablet Take 81 mg by mouth daily. Swallow whole.    [provider]  atorvastatin (LIPITOR) 80 MG tablet Take 80 mg by mouth every evening.  01/31/18   [provider]  calcium carbonate (TUMS - DOSED IN MG ELEMENTAL CALCIUM) 500 MG chewable tablet Chew 1 tablet by mouth daily as needed for indigestion or heartburn.    [provider]  carvedilol (COREG) 12.5 MG tablet Take 1 tablet (12.5 mg total) by mouth 2 (two) times daily. 01/07/21   Rai, Ripudeep K, MD  cloNIDine (CATAPRES) 0.3 MG tablet Take 0.3 mg by mouth 2 (two) times daily.  01/24/18   [provider]  furosemide (LASIX) 20 MG tablet Take 1 tablet (20 mg total) by mouth daily as needed for fluid or edema (shortness of breath). 01/07/21 01/07/22  Rai, Ripudeep K, MD  glipiZIDE (GLUCOTROL XL) 10 MG 24 hr tablet Take 10 mg by mouth daily.  01/31/18   [provider]  insulin glargine (LANTUS SOLOSTAR) 100 UNIT/ML Solostar Pen Inject 42  Units into the skin daily. 01/07/21   Rai, Ripudeep K, MD  ondansetron (ZOFRAN ODT) 4 MG disintegrating tablet Take 1 tablet (4 mg total) by mouth every 8 (eight) hours as needed for nausea or vomiting. 01/07/21   Rai, Ripudeep K, MD  pantoprazole (PROTONIX) 40 MG tablet Take 1 tablet (40 mg total) by mouth daily. 01/07/21   Rai, Delene Ruffini, MD    Review of Systems    Has chronic, stable dyspnea on exertion and mild lower extremity swelling.  He has been feeling on edge, more so than usual in the setting of known anxiety.  He denies chest pain, palpitations, PND, orthopnea, dizziness, syncope, or early satiety.  All other systems reviewed and are otherwise negative except as noted above.  Physical Exam    VS:  BP 124/70   Pulse 70   Ht 6\' 1"  (1.854 m)   Wt 269 lb (122 kg)   BMI 35.49 kg/m  , BMI Body mass index is 35.49 kg/m. GEN: Obese, in no acute distress. HEENT: normal. Neck: Supple,  obese, difficult to gauge JVP.  No carotid bruits, or masses. Cardiac: Irregularly irregular, 2/6 systolic murmur at the upper sternal borders, no rubs, or gallops. No clubbing, cyanosis, 1+ bilateral lower extremity edema to the lower calves.  Radials 2+/PT 1+ and equal bilaterally.  Respiratory:  Respirations regular and unlabored, clear to auscultation bilaterally. GI: Obese, soft, nontender, nondistended, BS + x 4. MS: no deformity or atrophy. Skin: warm and dry, no rash. Neuro:  Strength and sensation are intact. Psych: Normal affect.  Accessory Clinical Findings    ECG personally reviewed by me today -atrial fibrillation, 70, right axis deviation, right bundle branch block, prior inferior and lateral infarct- no acute changes.  Lab Results  Component Value Date   WBC 9.2 01/07/2021   HGB 10.8 (L) 01/07/2021   HCT 32.3 (L) 01/07/2021   MCV 96.7 01/07/2021   PLT 199 01/07/2021   Lab Results  Component Value Date   CREATININE 1.31 (H) 01/07/2021   BUN 27 (H) 01/07/2021   NA 130 (L) 01/07/2021   K 4.8 01/07/2021   CL 98 01/07/2021   CO2 25 01/07/2021   Lab Results  Component Value Date   ALT 25 01/06/2021   AST 17 01/06/2021   ALKPHOS 74 01/06/2021   BILITOT 1.0 01/06/2021   Lab Results  Component Value Date   CHOL 80 01/07/2021   HDL 26 (L) 01/07/2021   LDLCALC 40 01/07/2021   TRIG 69 01/07/2021   CHOLHDL 3.1 01/07/2021    Lab Results  Component Value Date   HGBA1C 7.8 (H) 01/07/2021    Assessment & Plan    1.  Persistent atrial fibrillation: Patient with a history of postoperative atrial fibrillation following CABG in 2014.  He notes that he was on anticoagulation for short period time after bypass but has not required it since.  At his last office visit in October 2021, he was in sinus rhythm.  EKG in March 2022 also shows sinus rhythm.  He was hospitalized with hyperkalemia in April however, no ECG was performed at that time.  He was rehospitalized on May 5  with hyperkalemia and noted to be in rate controlled atrial fibrillation.  Notes indicate he was having periodic bradycardia at night, which resulted in a reduction in his carvedilol dose to 12.5 mg twice daily.  The discharge summary mentions A. fib, he was not placed on anticoagulation for unclear reasons.  His CHA2DS2-VASc equals  4.  I am going to add Eliquis 5 mg twice daily today and we discussed indications at length.  As he is well rate controlled, I am not going to change his beta-blocker dose.  I will follow-up a CBC, basic metabolic panel, and TSH today.  Defer follow-up echo given recent echo in October 2021 showed an EF of 40 to 45% with global hypokinesis.  Follow-up in 3 weeks at which point, if he remains in atrial fibrillation, we will plan on cardioversion.  Will consider EP eval in the future for long-term management of A. fib that he will require sleep study and weight loss first.  2.  Hyperkalemia: Admitted in April and again in early May with hyperkalemia in the setting of lisinopril and spironolactone therapy.  He was advised to discontinue it after the first admission but he subsequently resumed.  He says he has since thrown away.  He did have follow-up lab work on May 10 at his primary care provider where his potassium was still moderately elevated at 5.5.  No action was taken after this finding.  Creatinine was elevated that day at 1.58 as well.  I will follow-up a basic metabolic panel today.  He has been encouraged to avoid high potassium foods.  3.  Coronary artery disease: Status post CABG in 2014.  He has not been having any chest pain.  His chronic stable dyspnea on exertion.  He remains on low-dose aspirin, statin, beta-blocker.  4.  Ischemic cardiomyopathy/chronic heart failure with midrange ejection fraction: EF 40 to 45% by echo in October 2021.  He has chronic, stable dyspnea on exertion and mild lower extremity edema.    Body habitus makes exam challenging though in looking  at his weights over time, he was listed at 249 pounds at his last office visit in October.  There is a reported weight of 245 pounds when seen in the ED March, though on admission in early May, he was up to 270 and is currently 269 pounds.  His BNP on May 5 was mildly elevated at 441.7.  Chest x-ray did not show any acute disease.  Follow-up labs today.  Creatinine stable, I will have him escalate Lasix dose as A. fib may be driving some worsening of heart failure.  He remains on beta-blocker therapy.  He is no longer on ACE inhibitor or spironolactone in the setting of hyperkalemia.  For the same reason, he is not a candidate for [employer].  Blood pressure stable at 124/70 and so I will not add any hydralazine/nitrate today but may look to do so in the future.  We discussed the importance of daily weights, sodium restriction, medication compliance, and symptom reporting and he verbalizes understanding.   5.  Essential hypertension: Stable on beta-blocker and clonidine.  6.  Hyperlipidemia: Remains on statin therapy with an LDL of 48 in May 2022.  7.  Tobacco abuse: Cessation advised.  8.  Peripheral arterial disease: Denies claudication.  Status post intervention last summer.  He remains on low-dose aspirin and statin.  9.  Type 2 diabetes mellitus: Hemoglobin A1c 7.8 in May.  Insulin management per primary care.  10.  Acute kidney injury: Noted during hospitalization.  Follow-up labs today.  11.  Disposition: Follow-up CBC, basic metabolic panel, and TSH today.  Follow-up in clinic in 3 weeks to reevaluate rhythm and determine necessity of cardioversion.  Nicolasa Ducking, NP 01/21/2021, 9:59 AM

## 2021-01-21 NOTE — Patient Instructions (Signed)
Medication Instructions:  Your physician has recommended you make the following change in your medication:   START Eliquis 5 mg twice daily. An Rx has been sent to your pharmacy  Medication samples have been provided to the patient.   Drug name: Eliquis      Strength:5mg          Qty: 3 boxes                        *If you need a refill on your cardiac medications before your next appointment, please call your pharmacy*   Lab Work: Bmp, Cbc, Tsh today  If you have labs (blood work) drawn today and your tests are completely normal, you will receive your results only by: Marland Kitchen MyChart Message (if you have MyChart) OR . A paper copy in the mail If you have any lab test that is abnormal or we need to change your treatment, we will call you to review the results.   Testing/Procedures: None ordered   Follow-Up: At [facility], you and your health needs are our priority.  As part of our continuing mission to provide you with exceptional heart care, we have created designated Provider Care Teams.  These Care Teams include your primary Cardiologist (physician) and Advanced Practice Providers (APPs -  Physician Assistants and Nurse Practitioners) who all work together to provide you with the care you need, when you need it.  We recommend signing up for the patient portal called "MyChart".  Sign up information is provided on this After Visit Summary.  MyChart is used to connect with patients for Virtual Visits (Telemedicine).  Patients are able to view lab/test results, encounter notes, upcoming appointments, etc.  Non-urgent messages can be sent to your provider as well.   To learn more about what you can do with MyChart, go to [email or web address].    Your next appointment:   3 week(s)  The format for your next appointment:   In Person  Provider:   Debbe Odea, MD   Other Instructions N/A

## 2021-01-22 LAB — BASIC METABOLIC PANEL
BUN/Creatinine Ratio: 17 (ref 10–24)
BUN: 23 mg/dL (ref 8–27)
CO2: 19 mmol/L — ABNORMAL LOW (ref 20–29)
Calcium: 9.7 mg/dL (ref 8.6–10.2)
Chloride: 100 mmol/L (ref 96–106)
Creatinine, Ser: 1.32 mg/dL — ABNORMAL HIGH (ref 0.76–1.27)
Glucose: 155 mg/dL — ABNORMAL HIGH (ref 65–99)
Potassium: 4.6 mmol/L (ref 3.5–5.2)
Sodium: 135 mmol/L (ref 134–144)
eGFR: 61 mL/min/{1.73_m2} (ref >59)

## 2021-01-22 LAB — CBC WITH DIFFERENTIAL/PLATELET
Basophils Absolute: 0.1 10*3/uL (ref 0.0–0.2)
Basos: 1 %
EOS (ABSOLUTE): 0.2 10*3/uL (ref 0.0–0.4)
Eos: 2 %
Hematocrit: 34.8 % — ABNORMAL LOW (ref 37.5–51.0)
Hemoglobin: 11.8 g/dL — ABNORMAL LOW (ref 13.0–17.7)
Immature Grans (Abs): 0 10*3/uL (ref 0.0–0.1)
Immature Granulocytes: 0 %
Lymphocytes Absolute: 1.3 10*3/uL (ref 0.7–3.1)
Lymphs: 15 %
MCH: 31.7 pg (ref 26.6–33.0)
MCHC: 33.9 g/dL (ref 31.5–35.7)
MCV: 94 fL (ref 79–97)
Monocytes Absolute: 0.6 10*3/uL (ref 0.1–0.9)
Monocytes: 6 %
Neutrophils Absolute: 6.6 10*3/uL (ref 1.4–7.0)
Neutrophils: 76 %
Platelets: 248 10*3/uL (ref 150–450)
RBC: 3.72 x10E6/uL — ABNORMAL LOW (ref 4.14–5.80)
RDW: 12.2 % (ref 11.6–15.4)
WBC: 8.8 10*3/uL (ref 3.4–10.8)

## 2021-01-22 LAB — TSH: TSH: 1.13 u[IU]/mL (ref 0.450–4.500)

## 2021-02-10 ENCOUNTER — Other Ambulatory Visit: Payer: Self-pay

## 2021-02-10 ENCOUNTER — Ambulatory Visit (INDEPENDENT_AMBULATORY_CARE_PROVIDER_SITE_OTHER): Payer: Medicaid Other | Admitting: Cardiology

## 2021-02-10 ENCOUNTER — Encounter: Payer: Self-pay | Admitting: Cardiology

## 2021-02-10 ENCOUNTER — Telehealth: Payer: Self-pay | Admitting: Cardiology

## 2021-02-10 VITALS — BP 110/70 | HR 73 | Ht 73.0 in | Wt 283.5 lb

## 2021-02-10 DIAGNOSIS — F17200 Nicotine dependence, unspecified, uncomplicated: Secondary | ICD-10-CM

## 2021-02-10 DIAGNOSIS — I255 Ischemic cardiomyopathy: Secondary | ICD-10-CM | POA: Diagnosis not present

## 2021-02-10 DIAGNOSIS — I2510 Atherosclerotic heart disease of native coronary artery without angina pectoris: Secondary | ICD-10-CM

## 2021-02-10 DIAGNOSIS — I4819 Other persistent atrial fibrillation: Secondary | ICD-10-CM

## 2021-02-10 MED ORDER — TORSEMIDE 20 MG PO TABS: 40.0000 mg | ORAL_TABLET | Freq: Every day | ORAL | 5 refills | Status: DC

## 2021-02-10 MED ORDER — ISOSORB DINITRATE-HYDRALAZINE 20-37.5 MG PO TABS: 1.0000 | ORAL_TABLET | Freq: Three times a day (TID) | ORAL | 5 refills | Status: DC

## 2021-02-10 NOTE — Telephone Encounter (Signed)
Noted  

## 2021-02-10 NOTE — H&P (View-Only) (Signed)
Cardiology Office Note:    Date:  02/10/2021   ID:  Marc [name], DOB April 08, 1961, MRN [id number]  PCP:  [facility]  [facility] Cardiologist:  Debbe Odea, MD  [facility] Electrophysiologist:  None   Referring MD: [facility]*   Chief Complaint  Patient presents with   Other    Edema. Meds reviewed verbally with pt.    History of Present Illness:    Marc [name] is a 60 y.o. male with a hx of CAD/CABG x 4 2015 (Lima to LAD SVG to Ramus /OM2, OM3) hypertension, hyperlipidemia, ICM EF 40-45%%, PAD, postop A. fib, persistent A. fib current smoker who presents for follow-up.   Last seen on 01/21/2021 where EKG showed atrial fibrillation.  Heart rate was controlled.  Patient was started on Eliquis with plans for DC cardioversion if still in A. fib on after 3 weeks of anticoagulation.  He endorsed lower extremity swelling and shortness of breath with minimal exertion.  Denies chest pain.  Takes all his medications as prescribed.  His lisinopril and Aldactone was stopped due to hyperkalemia and renal dysfunction.  Potassium was as high as 7.  Prior notes CAD/CABG x 4 2015 (Lima to LAD SVG to Ramus /OM2, OM3) Echo 06/2020 mild to moderately reduced EF, 40 to 45%. Echo 03/2018 EF 40 to 45%  Patient has history of nonhealing ulceration of the left foot, underwent a left great toe amputation with subsequent wound.  He is being followed up by vascular surgery.  Noninvasive studies have shown noncompressible vessels on the right with ABIs 1.07.  Podiatry is planning resection of left foot wound to improve wound healing.   Past Medical History:  Diagnosis Date   Anxiety    Chronic systolic heart failure (HCC)    a. 07/2013 Echo: Ejection fraction of 45-50% with possible inferior wall hypokinesis; b. 03/2018 Echo: EF 40-45%; c. 06/2020 Echo: EF 40-45%, glob HK. Mod LVH, gr1 DD. Nl PASP. Mild BAE. Triv MR. Mild AoV sclerosis. Mild Ao dil (38mm).    Coronary artery disease    Significant left main and Severe three-vessel coronary artery disease in December of 2014. He underwent CABG in January of 2015 with LIMA to LAD, sequential SVG to ramus/OM 2 and SVG to OM 3   Diabetes mellitus without complication (HCC)    Hyperlipidemia    Hypertension    Ischemic cardiomyopathy    a. 07/2013 Echo: EF 45-50%; b. 03/2018 Echo: EF 40-45%; c. 06/2020 Echo: EF 40-45%.   Medical non-compliance    MI (myocardial infarction) (HCC)    Obesity    PAD (peripheral artery disease) (HCC)    a. 04/2020 PTA of L PT.   PAF (paroxysmal atrial fibrillation) (HCC)    Tobacco abuse     Past Surgical History:  Procedure Laterality Date   AMPUTATION Left 04/08/2020   Procedure: AMPUTATION RAY;  Surgeon: Rosetta Posner, DPM;  Location: [facility];  Service: Podiatry;  Laterality: Left;   AMPUTATION Left 06/25/2020   Procedure: AMPUTATION RAY 1ST RAY PARTIAL LEFT;  Surgeon: Rosetta Posner, DPM;  Location: [facility];  Service: Podiatry;  Laterality: Left;   AMPUTATION TOE Right 02/29/2016   Procedure: AMPUTATION TOE;  Surgeon: Linus Galas, DPM;  Location: [facility];  Service: Podiatry;  Laterality: Right;  Great toe   CARDIAC CATHETERIZATION     [facility] no stent   CORONARY ARTERY BYPASS GRAFT N/A 09/16/2013   Procedure: CORONARY ARTERY BYPASS GRAFTING (CABG);  Surgeon:  Bryan K Bartle, MD;  Location: [facility] OR;  Service: Open Heart Surgery;  Laterality: N/A;  CABG x four, using left internal mammary artery and right leg greater saphenous vein   INTRAOPERATIVE TRANSESOPHAGEAL ECHOCARDIOGRAM N/A 09/16/2013   Procedure: INTRAOPERATIVE TRANSESOPHAGEAL ECHOCARDIOGRAM;  Surgeon: Bryan K Bartle, MD;  Location: [facility] OR;  Service: Open Heart Surgery;  Laterality: N/A;   knee surgery Right    LASIK Bilateral    LOWER EXTREMITY ANGIOGRAPHY Left 04/07/2020   Procedure: Lower Extremity Angiography;  Surgeon: Schnier, Gregory G, MD;  Location: [facility];  Service: Cardiovascular;   Laterality: Left;   LOWER EXTREMITY ANGIOGRAPHY Left 04/09/2020   Procedure: Lower Extremity Angiography (Pedal Approach);  Surgeon: Schnier, Gregory G, MD;  Location: [facility];  Service: Cardiovascular;  Laterality: Left;    Current Medications: Current Meds  Medication Sig   apixaban (ELIQUIS) 5 MG TABS tablet Take 1 tablet (5 mg total) by mouth 2 (two) times daily.   atorvastatin (LIPITOR) 80 MG tablet Take 80 mg by mouth every evening.    calcium carbonate (TUMS - DOSED IN MG ELEMENTAL CALCIUM) 500 MG chewable tablet Chew 1 tablet by mouth daily as needed for indigestion or heartburn.   carvedilol (COREG) 12.5 MG tablet Take 1 tablet (12.5 mg total) by mouth 2 (two) times daily.   cloNIDine (CATAPRES) 0.3 MG tablet Take 0.3 mg by mouth 2 (two) times daily.    glipiZIDE (GLUCOTROL XL) 10 MG 24 hr tablet Take 10 mg by mouth daily.    insulin glargine (LANTUS SOLOSTAR) 100 UNIT/ML Solostar Pen Inject 42 Units into the skin daily.   isosorbide-hydrALAZINE (BIDIL) 20-37.5 MG tablet Take 1 tablet by mouth 3 (three) times daily.   ondansetron (ZOFRAN ODT) 4 MG disintegrating tablet Take 1 tablet (4 mg total) by mouth every 8 (eight) hours as needed for nausea or vomiting.   pantoprazole (PROTONIX) 40 MG tablet Take 1 tablet (40 mg total) by mouth daily.   torsemide (DEMADEX) 20 MG tablet Take 2 tablets (40 mg total) by mouth daily.   [DISCONTINUED] amLODipine (NORVASC) 10 MG tablet Take 10 mg by mouth daily.   [DISCONTINUED] aspirin EC 81 MG tablet Take 81 mg by mouth daily. Swallow whole.   [DISCONTINUED] furosemide (LASIX) 20 MG tablet Takes 2 tablets am and 1 tablet pm qd.     Allergies:   Patient has no known allergies.   Social History   Socioeconomic History   Marital status: Divorced    Spouse name: Not on file   Number of children: Not on file   Years of education: Not on file   Highest education level: Not on file  Occupational History   Not on file  Tobacco Use    Smoking status: Some Days    Packs/day: 0.50    Years: 45.00    Pack years: 22.50    Types: Cigarettes   Smokeless tobacco: Never   Tobacco comments:    trying to quit  Vaping Use   Vaping Use: Never used  Substance and Sexual Activity   Alcohol use: No    Alcohol/week: 0.0 standard drinks   Drug use: No   Sexual activity: Not on file  Other Topics Concern   Not on file  Social History Narrative   Not on file   Social Determinants of Health   Financial Resource Strain: Not on file  Food Insecurity: Not on file  Transportation Needs: Not on file  Physical Activity: Not on   file  Stress: Not on file  Social Connections: Not on file     Family History: The patient's family history includes Heart disease in his mother.  ROS:   Please see the history of present illness.     All other systems reviewed and are negative.  EKGs/Labs/Other Studies Reviewed:    The following studies were reviewed today:   EKG:  EKG is  ordered today.  The ekg ordered today demonstrates atrial fibrillation, right bundle branch block  Recent Labs: 11/22/2020: Magnesium 1.6 01/06/2021: ALT 25; B Natriuretic Peptide 441.7 01/21/2021: BUN 23; Creatinine, Ser 1.32; Hemoglobin 11.8; Platelets 248; Potassium 4.6; Sodium 135; TSH 1.130  Recent Lipid Panel    Component Value Date/Time   CHOL 80 01/07/2021 0303   CHOL 135 07/21/2013 0759   TRIG 69 01/07/2021 0303   TRIG 152 07/21/2013 0759   HDL 26 (L) 01/07/2021 0303   HDL 39 (L) 07/21/2013 0759   CHOLHDL 3.1 01/07/2021 0303   VLDL 14 01/07/2021 0303   VLDL 30 07/21/2013 0759   LDLCALC 40 01/07/2021 0303   LDLCALC 66 07/21/2013 0759     Risk Assessment/Calculations:      Physical Exam:    VS:  BP 110/70 (BP Location: Left Arm, Patient Position: Sitting, Cuff Size: Large)   Pulse 73   Ht 6\' 1"  (1.854 m)   Wt 283 lb 8 oz (128.6 kg)   SpO2 97%   BMI 37.40 kg/m     Wt Readings from Last 3 Encounters:  02/10/21 283 lb 8 oz (128.6 kg)   01/21/21 269 lb (122 kg)  01/07/21 270 lb 8.1 oz (122.7 kg)     GEN:  Well nourished, well developed in no acute distress HEENT: Normal NECK: No JVD; No carotid bruits CARDIAC: RRR, no murmurs, rubs, gallops RESPIRATORY:  Clear to auscultation without rales, wheezing or rhonchi  ABDOMEN: Soft, non-tender, non-distended MUSCULOSKELETAL:  1+edema; No deformity  SKIN: Warm and dry NEUROLOGIC:  Alert and oriented x 3 PSYCHIATRIC:  Normal affect   ASSESSMENT:    1. Persistent atrial fibrillation (HCC)   2. Coronary artery disease involving native coronary artery of native heart without angina pectoris   3. Ischemic cardiomyopathy   4. Smoking     PLAN:    In order of problems listed above:  Persistent A. fib, CHA2DS2-VASc of 4.  Heart rate controlled.  Continue Coreg, Eliquis.  Plan for DC cardioversion in about 2 weeks.  Has already been on 2 to 3 weeks of uninterrupted Eliquis so far. Patient with history of CAD, status post CABG x4.  Denies symptoms of chest pain.  Continue Eliquis, Lipitor, stop aspirin 81. History of ischemic cardiomyopathy, EF 40 to 45%.  Describes NYHA class III symptoms.  Appears volume overloaded.  Stop Lasix, start torsemide 40 mg daily.  Continue Coreg.  Start BiDil 1 tab 3 times daily.  Stop amlodipine (lisinopril, spironolactone previously stopped due to hyperkalemia, renal dysfunction) current smoker, smoking cessation advised.   Follow-up after DC cardioversion  Total encounter time 40 minutes  Greater than 50% was spent in counseling and coordination of care with the patient  Shared Decision Making/Informed Consent The risks (stroke, cardiac arrhythmias rarely resulting in the need for a temporary or permanent pacemaker, skin irritation or burns and complications associated with conscious sedation including aspiration, arrhythmia, respiratory failure and death), benefits (restoration of normal sinus rhythm) and alternatives of a direct current  cardioversion were explained in detail to Mr. Favaro and he agrees  to proceed.     Medication Adjustments/Labs and Tests Ordered: Current medicines are reviewed at length with the patient today.  Concerns regarding medicines are outlined above.  Orders Placed This Encounter  Procedures   Basic metabolic panel   CBC   EKG 12-Lead    Meds ordered this encounter  Medications   isosorbide-hydrALAZINE (BIDIL) 20-37.5 MG tablet    Sig: Take 1 tablet by mouth 3 (three) times daily.    Dispense:  90 tablet    Refill:  5   torsemide (DEMADEX) 20 MG tablet    Sig: Take 2 tablets (40 mg total) by mouth daily.    Dispense:  60 tablet    Refill:  5     Patient Instructions  Medication Instructions:  Your physician has recommended you make the following change in your medication:    STOP taking Amlodipine.  STOP taking Lasix  STOP taking Aspirin  START taking Bidol 20-3735 MG three times a day. 5.  START taking Torsemide 40 MG once a day.  *If you need a refill on your cardiac medications before your next appointment, please call your pharmacy*   Lab Work:  Your physician recommends that you return for lab work in: 1 week (02/24/21)  - Please go to the [facility] You will check in at the front desk to the right as you walk into the atrium. Valet Parking is offered if needed. - No appointment needed. You may go any day between 7 am and 6 pm.     Testing/Procedures:  You are scheduled for a Cardioversion on __Wed 6/22/22______________ with Dr.___Agbor-Etang________ Please [facility] at the [facility] at ___0630______ a.m. on the day of your procedure.  DIET INSTRUCTIONS:  Nothing to eat or drink after midnight except your medications with a              sip of water.         Labs: ____see lab instructions above______________  Medications:  YOU MAY TAKE ALL of your remaining medications with a small amount of water.  Must have a responsible person to drive you  home.  Bring a current list of your medications and current insurance cards.    If you have any questions after you get home, please call the office at [phone]    Follow-Up: At [facility], you and your health needs are our priority.  As part of our continuing mission to provide you with exceptional heart care, we have created designated Provider Care Teams.  These Care Teams include your primary Cardiologist (physician) and Advanced Practice Providers (APPs -  Physician Assistants and Nurse Practitioners) who all work together to provide you with the care you need, when you need it.  We recommend signing up for the patient portal called "MyChart".  Sign up information is provided on this After Visit Summary.  MyChart is used to connect with patients for Virtual Visits (Telemedicine).  Patients are able to view lab/test results, encounter notes, upcoming appointments, etc.  Non-urgent messages can be sent to your provider as well.   To learn more about what you can do with MyChart, go to [email or web address].    Your next appointment:   4-5 week(s)  The format for your next appointment:   In Person  Provider:   Debbe Odea, MD   Other Instructions    Signed, Debbe Odea, MD  02/10/2021 12:01 PM    [facility]

## 2021-02-10 NOTE — Progress Notes (Signed)
Cardiology Office Note:    Date:  02/10/2021   ID:  Marc [name], DOB April 08, 1961, MRN [id number]  PCP:  [facility]  [facility] Cardiologist:  Debbe Odea, MD  [facility] Electrophysiologist:  None   Referring MD: [facility], [facility]*   Chief Complaint  Patient presents with   Other    Edema. Meds reviewed verbally with pt.    History of Present Illness:    Marc [name] is a 60 y.o. male with a hx of CAD/CABG x 4 2015 (Lima to LAD SVG to Ramus /OM2, OM3) hypertension, hyperlipidemia, ICM EF 40-45%%, PAD, postop A. fib, persistent A. fib current smoker who presents for follow-up.   Last seen on 01/21/2021 where EKG showed atrial fibrillation.  Heart rate was controlled.  Patient was started on Eliquis with plans for DC cardioversion if still in A. fib on after 3 weeks of anticoagulation.  He endorsed lower extremity swelling and shortness of breath with minimal exertion.  Denies chest pain.  Takes all his medications as prescribed.  His lisinopril and Aldactone was stopped due to hyperkalemia and renal dysfunction.  Potassium was as high as 7.  Prior notes CAD/CABG x 4 2015 (Lima to LAD SVG to Ramus /OM2, OM3) Echo 06/2020 mild to moderately reduced EF, 40 to 45%. Echo 03/2018 EF 40 to 45%  Patient has history of nonhealing ulceration of the left foot, underwent a left great toe amputation with subsequent wound.  He is being followed up by vascular surgery.  Noninvasive studies have shown noncompressible vessels on the right with ABIs 1.07.  Podiatry is planning resection of left foot wound to improve wound healing.   Past Medical History:  Diagnosis Date   Anxiety    Chronic systolic heart failure (HCC)    a. 07/2013 Echo: Ejection fraction of 45-50% with possible inferior wall hypokinesis; b. 03/2018 Echo: EF 40-45%; c. 06/2020 Echo: EF 40-45%, glob HK. Mod LVH, gr1 DD. Nl PASP. Mild BAE. Triv MR. Mild AoV sclerosis. Mild Ao dil (38mm).    Coronary artery disease    Significant left main and Severe three-vessel coronary artery disease in December of 2014. He underwent CABG in January of 2015 with LIMA to LAD, sequential SVG to ramus/OM 2 and SVG to OM 3   Diabetes mellitus without complication (HCC)    Hyperlipidemia    Hypertension    Ischemic cardiomyopathy    a. 07/2013 Echo: EF 45-50%; b. 03/2018 Echo: EF 40-45%; c. 06/2020 Echo: EF 40-45%.   Medical non-compliance    MI (myocardial infarction) (HCC)    Obesity    PAD (peripheral artery disease) (HCC)    a. 04/2020 PTA of L PT.   PAF (paroxysmal atrial fibrillation) (HCC)    Tobacco abuse     Past Surgical History:  Procedure Laterality Date   AMPUTATION Left 04/08/2020   Procedure: AMPUTATION RAY;  Surgeon: Rosetta Posner, DPM;  Location: [facility] ORS;  Service: Podiatry;  Laterality: Left;   AMPUTATION Left 06/25/2020   Procedure: AMPUTATION RAY 1ST RAY PARTIAL LEFT;  Surgeon: Rosetta Posner, DPM;  Location: [facility] ORS;  Service: Podiatry;  Laterality: Left;   AMPUTATION TOE Right 02/29/2016   Procedure: AMPUTATION TOE;  Surgeon: Linus Galas, DPM;  Location: [facility] ORS;  Service: Podiatry;  Laterality: Right;  Great toe   CARDIAC CATHETERIZATION     [facility] no stent   CORONARY ARTERY BYPASS GRAFT N/A 09/16/2013   Procedure: CORONARY ARTERY BYPASS GRAFTING (CABG);  Surgeon:  [name], MD;  Location: [facility] OR;  Service: Open Heart Surgery;  Laterality: N/A;  CABG x four, using left internal mammary artery and right leg greater saphenous vein   INTRAOPERATIVE TRANSESOPHAGEAL ECHOCARDIOGRAM N/A 09/16/2013   Procedure: INTRAOPERATIVE TRANSESOPHAGEAL ECHOCARDIOGRAM;  Surgeon: [name], MD;  Location: [facility] OR;  Service: Open Heart Surgery;  Laterality: N/A;   knee surgery Right    LASIK Bilateral    LOWER EXTREMITY ANGIOGRAPHY Left 04/07/2020   Procedure: Lower Extremity Angiography;  Surgeon: Renford Dills, MD;  Location: [facility] INVASIVE CV LAB;  Service: Cardiovascular;   Laterality: Left;   LOWER EXTREMITY ANGIOGRAPHY Left 04/09/2020   Procedure: Lower Extremity Angiography (Pedal Approach);  Surgeon: Renford Dills, MD;  Location: [facility] INVASIVE CV LAB;  Service: Cardiovascular;  Laterality: Left;    Current Medications: Current Meds  Medication Sig   apixaban (ELIQUIS) 5 MG TABS tablet Take 1 tablet (5 mg total) by mouth 2 (two) times daily.   atorvastatin (LIPITOR) 80 MG tablet Take 80 mg by mouth every evening.    calcium carbonate (TUMS - DOSED IN MG ELEMENTAL CALCIUM) 500 MG chewable tablet Chew 1 tablet by mouth daily as needed for indigestion or heartburn.   carvedilol (COREG) 12.5 MG tablet Take 1 tablet (12.5 mg total) by mouth 2 (two) times daily.   cloNIDine (CATAPRES) 0.3 MG tablet Take 0.3 mg by mouth 2 (two) times daily.    glipiZIDE (GLUCOTROL XL) 10 MG 24 hr tablet Take 10 mg by mouth daily.    insulin glargine (LANTUS SOLOSTAR) 100 UNIT/ML Solostar Pen Inject 42 Units into the skin daily.   isosorbide-hydrALAZINE (BIDIL) 20-37.5 MG tablet Take 1 tablet by mouth 3 (three) times daily.   ondansetron (ZOFRAN ODT) 4 MG disintegrating tablet Take 1 tablet (4 mg total) by mouth every 8 (eight) hours as needed for nausea or vomiting.   pantoprazole (PROTONIX) 40 MG tablet Take 1 tablet (40 mg total) by mouth daily.   torsemide (DEMADEX) 20 MG tablet Take 2 tablets (40 mg total) by mouth daily.   [DISCONTINUED] amLODipine (NORVASC) 10 MG tablet Take 10 mg by mouth daily.   [DISCONTINUED] aspirin EC 81 MG tablet Take 81 mg by mouth daily. Swallow whole.   [DISCONTINUED] furosemide (LASIX) 20 MG tablet Takes 2 tablets am and 1 tablet pm qd.     Allergies:   Patient has no known allergies.   Social History   Socioeconomic History   Marital status: Divorced    Spouse name: Not on file   Number of children: Not on file   Years of education: Not on file   Highest education level: Not on file  Occupational History   Not on file  Tobacco Use    Smoking status: Some Days    Packs/day: 0.50    Years: 45.00    Pack years: 22.50    Types: Cigarettes   Smokeless tobacco: Never   Tobacco comments:    trying to quit  Vaping Use   Vaping Use: Never used  Substance and Sexual Activity   Alcohol use: No    Alcohol/week: 0.0 standard drinks   Drug use: No   Sexual activity: Not on file  Other Topics Concern   Not on file  Social History Narrative   Not on file   Social Determinants of Health   Financial Resource Strain: Not on file  Food Insecurity: Not on file  Transportation Needs: Not on file  Physical Activity: Not on  file  Stress: Not on file  Social Connections: Not on file     Family History: The patient's family history includes Heart disease in his mother.  ROS:   Please see the history of present illness.     All other systems reviewed and are negative.  EKGs/Labs/Other Studies Reviewed:    The following studies were reviewed today:   EKG:  EKG is  ordered today.  The ekg ordered today demonstrates atrial fibrillation, right bundle branch block  Recent Labs: 11/22/2020: Magnesium 1.6 01/06/2021: ALT 25; B Natriuretic Peptide 441.7 01/21/2021: BUN 23; Creatinine, Ser 1.32; Hemoglobin 11.8; Platelets 248; Potassium 4.6; Sodium 135; TSH 1.130  Recent Lipid Panel    Component Value Date/Time   CHOL 80 01/07/2021 0303   CHOL 135 07/21/2013 0759   TRIG 69 01/07/2021 0303   TRIG 152 07/21/2013 0759   HDL 26 (L) 01/07/2021 0303   HDL 39 (L) 07/21/2013 0759   CHOLHDL 3.1 01/07/2021 0303   VLDL 14 01/07/2021 0303   VLDL 30 07/21/2013 0759   LDLCALC 40 01/07/2021 0303   LDLCALC 66 07/21/2013 0759     Risk Assessment/Calculations:      Physical Exam:    VS:  BP 110/70 (BP Location: Left Arm, Patient Position: Sitting, Cuff Size: Large)   Pulse 73   Ht 6\' 1"  (1.854 m)   Wt 283 lb 8 oz (128.6 kg)   SpO2 97%   BMI 37.40 kg/m     Wt Readings from Last 3 Encounters:  02/10/21 283 lb 8 oz (128.6 kg)   01/21/21 269 lb (122 kg)  01/07/21 270 lb 8.1 oz (122.7 kg)     GEN:  Well nourished, well developed in no acute distress HEENT: Normal NECK: No JVD; No carotid bruits CARDIAC: RRR, no murmurs, rubs, gallops RESPIRATORY:  Clear to auscultation without rales, wheezing or rhonchi  ABDOMEN: Soft, non-tender, non-distended MUSCULOSKELETAL:  1+edema; No deformity  SKIN: Warm and dry NEUROLOGIC:  Alert and oriented x 3 PSYCHIATRIC:  Normal affect   ASSESSMENT:    1. Persistent atrial fibrillation (HCC)   2. Coronary artery disease involving native coronary artery of native heart without angina pectoris   3. Ischemic cardiomyopathy   4. Smoking     PLAN:    In order of problems listed above:  Persistent A. fib, CHA2DS2-VASc of 4.  Heart rate controlled.  Continue Coreg, Eliquis.  Plan for DC cardioversion in about 2 weeks.  Has already been on 2 to 3 weeks of uninterrupted Eliquis so far. Patient with history of CAD, status post CABG x4.  Denies symptoms of chest pain.  Continue Eliquis, Lipitor, stop aspirin 81. History of ischemic cardiomyopathy, EF 40 to 45%.  Describes NYHA class III symptoms.  Appears volume overloaded.  Stop Lasix, start torsemide 40 mg daily.  Continue Coreg.  Start BiDil 1 tab 3 times daily.  Stop amlodipine (lisinopril, spironolactone previously stopped due to hyperkalemia, renal dysfunction) current smoker, smoking cessation advised.   Follow-up after DC cardioversion  Total encounter time 40 minutes  Greater than 50% was spent in counseling and coordination of care with the patient  Shared Decision Making/Informed Consent The risks (stroke, cardiac arrhythmias rarely resulting in the need for a temporary or permanent pacemaker, skin irritation or burns and complications associated with conscious sedation including aspiration, arrhythmia, respiratory failure and death), benefits (restoration of normal sinus rhythm) and alternatives of a direct current  cardioversion were explained in detail to Marc [name] and he agrees  to proceed.     Medication Adjustments/Labs and Tests Ordered: Current medicines are reviewed at length with the patient today.  Concerns regarding medicines are outlined above.  Orders Placed This Encounter  Procedures   Basic metabolic panel   CBC   EKG 12-Lead    Meds ordered this encounter  Medications   isosorbide-hydrALAZINE (BIDIL) 20-37.5 MG tablet    Sig: Take 1 tablet by mouth 3 (three) times daily.    Dispense:  90 tablet    Refill:  5   torsemide (DEMADEX) 20 MG tablet    Sig: Take 2 tablets (40 mg total) by mouth daily.    Dispense:  60 tablet    Refill:  5     Patient Instructions  Medication Instructions:  Your physician has recommended you make the following change in your medication:    STOP taking Amlodipine.  STOP taking Lasix  STOP taking Aspirin  START taking Bidol 20-3735 MG three times a day. 5.  START taking Torsemide 40 MG once a day.  *If you need a refill on your cardiac medications before your next appointment, please call your pharmacy*   Lab Work:  Your physician recommends that you return for lab work in: 1 week (02/24/21)  - Please go to the [facility] You will check in at the front desk to the right as you walk into the atrium. Valet Parking is offered if needed. - No appointment needed. You may go any day between 7 am and 6 pm.     Testing/Procedures:  You are scheduled for a Cardioversion on __Wed 6/22/22______________ with Dr.___Agbor-Etang________ Please arrive at the [facility] of [facility] at ___0630______ a.m. on the day of your procedure.  DIET INSTRUCTIONS:  Nothing to eat or drink after midnight except your medications with a              sip of water.         Labs: ____see lab instructions above______________  Medications:  YOU MAY TAKE ALL of your remaining medications with a small amount of water.  Must have a responsible person to drive you  home.  Bring a current list of your medications and current insurance cards.    If you have any questions after you get home, please call the office at [phone]    Follow-Up: At [facility], you and your health needs are our priority.  As part of our continuing mission to provide you with exceptional heart care, we have created designated Provider Care Teams.  These Care Teams include your primary Cardiologist (physician) and Advanced Practice Providers (APPs -  Physician Assistants and Nurse Practitioners) who all work together to provide you with the care you need, when you need it.  We recommend signing up for the patient portal called "MyChart".  Sign up information is provided on this After Visit Summary.  MyChart is used to connect with patients for Virtual Visits (Telemedicine).  Patients are able to view lab/test results, encounter notes, upcoming appointments, etc.  Non-urgent messages can be sent to your provider as well.   To learn more about what you can do with MyChart, go to [email or web address].    Your next appointment:   4-5 week(s)  The format for your next appointment:   In Person  Provider:   Debbe Odea, MD   Other Instructions    Signed, Debbe Odea, MD  02/10/2021 12:01 PM    [facility]

## 2021-02-10 NOTE — Telephone Encounter (Signed)
Pt c/o swelling: STAT is pt has developed SOB within 24 hours  If swelling, where is the swelling located? Both legs  How much weight have you gained and in what time span? 20 lbs since October  Have you gained 3 pounds in a day or 5 pounds in a week? Does not feel like it  Do you have a log of your daily weights (if so, list)? N/a  Are you currently taking a fluid pill? yes  Are you currently SOB? yes  Have you traveled recently? Local areas only  Patient is scheduled for 6/9

## 2021-02-10 NOTE — Patient Instructions (Signed)
Medication Instructions:  Your physician has recommended you make the following change in your medication:    STOP taking Amlodipine.  STOP taking Lasix  STOP taking Aspirin  START taking Bidol 20-3735 MG three times a day. 5.  START taking Torsemide 40 MG once a day.  *If you need a refill on your cardiac medications before your next appointment, please call your pharmacy*   Lab Work:  Your physician recommends that you return for lab work in: 1 week (02/24/21)  - Please go to the [facility]. You will check in at the front desk to the right as you walk into the atrium. Valet Parking is offered if needed. - No appointment needed. You may go any day between 7 am and 6 pm.     Testing/Procedures:  You are scheduled for a Cardioversion on __Wed 6/22/22______________ with Dr.___Agbor-Etang________ Please arrive at the [facility] at ___0630______ a.m. on the day of your procedure.  DIET INSTRUCTIONS:  Nothing to eat or drink after midnight except your medications with a              sip of water.         Labs: ____see lab instructions above______________  Medications:  YOU MAY TAKE ALL of your remaining medications with a small amount of water.  Must have a responsible person to drive you home.  Bring a current list of your medications and current insurance cards.    If you have any questions after you get home, please call the office at [phone]    Follow-Up: At [facility], you and your health needs are our priority.  As part of our continuing mission to provide you with exceptional heart care, we have created designated Provider Care Teams.  These Care Teams include your primary Cardiologist (physician) and Advanced Practice Providers (APPs -  Physician Assistants and Nurse Practitioners) who all work together to provide you with the care you need, when you need it.  We recommend signing up for the patient portal called "MyChart".  Sign up information  is provided on this After Visit Summary.  MyChart is used to connect with patients for Virtual Visits (Telemedicine).  Patients are able to view lab/test results, encounter notes, upcoming appointments, etc.  Non-urgent messages can be sent to your provider as well.   To learn more about what you can do with MyChart, go to [email or web address].    Your next appointment:   4-5 week(s)  The format for your next appointment:   In Person  Provider:   Debbe Odea, MD   Other Instructions

## 2021-02-14 ENCOUNTER — Ambulatory Visit: Payer: Medicaid Other | Admitting: Cardiology

## 2021-02-14 ENCOUNTER — Other Ambulatory Visit
Admission: RE | Admit: 2021-02-14 | Discharge: 2021-02-14 | Disposition: A | Discharge status: Home / Self Care | Payer: Medicaid Other | Attending: Cardiology | Admitting: Cardiology

## 2021-02-14 DIAGNOSIS — I4819 Other persistent atrial fibrillation: Secondary | ICD-10-CM | POA: Diagnosis not present

## 2021-02-14 LAB — BASIC METABOLIC PANEL
Anion gap: 7 (ref 5–15)
BUN: 20 mg/dL (ref 8–23)
CO2: 26 mmol/L (ref 22–32)
Calcium: 9 mg/dL (ref 8.9–10.3)
Chloride: 103 mmol/L (ref 98–111)
Creatinine, Ser: 1.23 mg/dL (ref 0.61–1.24)
GFR, Estimated: >60 mL/min (ref >60)
Glucose, Bld: 128 mg/dL — ABNORMAL HIGH (ref 70–99)
Potassium: 3.5 mmol/L (ref 3.5–5.1)
Sodium: 136 mmol/L (ref 135–145)

## 2021-02-14 LAB — CBC
HCT: 32.7 % — ABNORMAL LOW (ref 39.0–52.0)
Hemoglobin: 11 g/dL — ABNORMAL LOW (ref 13.0–17.0)
MCH: 31.7 pg (ref 26.0–34.0)
MCHC: 33.6 g/dL (ref 30.0–36.0)
MCV: 94.2 fL (ref 80.0–100.0)
Platelets: 241 10*3/uL (ref 150–400)
RBC: 3.47 MIL/uL — ABNORMAL LOW (ref 4.22–5.81)
RDW: 13.8 % (ref 11.5–15.5)
WBC: 6.8 10*3/uL (ref 4.0–10.5)
nRBC: 0 % (ref 0.0–0.2)

## 2021-02-23 ENCOUNTER — Encounter
Admission: RE | Disposition: A | Discharge status: Home / Self Care | Payer: Self-pay | Source: Home / Self Care | Attending: Cardiology

## 2021-02-23 ENCOUNTER — Encounter: Payer: Self-pay | Admitting: Cardiology

## 2021-02-23 ENCOUNTER — Ambulatory Visit
Admission: RE | Admit: 2021-02-23 | Discharge: 2021-02-23 | Disposition: A | Discharge status: Home / Self Care | Payer: Medicaid Other | Attending: Cardiology | Admitting: Cardiology

## 2021-02-23 ENCOUNTER — Ambulatory Visit: Payer: Medicaid Other | Admitting: Anesthesiology

## 2021-02-23 ENCOUNTER — Other Ambulatory Visit: Payer: Self-pay

## 2021-02-23 DIAGNOSIS — I4819 Other persistent atrial fibrillation: Secondary | ICD-10-CM | POA: Insufficient documentation

## 2021-02-23 DIAGNOSIS — Z794 Long term (current) use of insulin: Secondary | ICD-10-CM | POA: Diagnosis not present

## 2021-02-23 DIAGNOSIS — I2510 Atherosclerotic heart disease of native coronary artery without angina pectoris: Secondary | ICD-10-CM | POA: Diagnosis not present

## 2021-02-23 DIAGNOSIS — F17210 Nicotine dependence, cigarettes, uncomplicated: Secondary | ICD-10-CM | POA: Diagnosis not present

## 2021-02-23 DIAGNOSIS — Z7982 Long term (current) use of aspirin: Secondary | ICD-10-CM | POA: Diagnosis not present

## 2021-02-23 DIAGNOSIS — I110 Hypertensive heart disease with heart failure: Secondary | ICD-10-CM | POA: Diagnosis not present

## 2021-02-23 DIAGNOSIS — Z79899 Other long term (current) drug therapy: Secondary | ICD-10-CM | POA: Insufficient documentation

## 2021-02-23 DIAGNOSIS — Z951 Presence of aortocoronary bypass graft: Secondary | ICD-10-CM | POA: Insufficient documentation

## 2021-02-23 DIAGNOSIS — I255 Ischemic cardiomyopathy: Secondary | ICD-10-CM | POA: Insufficient documentation

## 2021-02-23 DIAGNOSIS — I4510 Unspecified right bundle-branch block: Secondary | ICD-10-CM | POA: Diagnosis not present

## 2021-02-23 DIAGNOSIS — I5022 Chronic systolic (congestive) heart failure: Secondary | ICD-10-CM | POA: Diagnosis not present

## 2021-02-23 DIAGNOSIS — E1151 Type 2 diabetes mellitus with diabetic peripheral angiopathy without gangrene: Secondary | ICD-10-CM | POA: Insufficient documentation

## 2021-02-23 DIAGNOSIS — E785 Hyperlipidemia, unspecified: Secondary | ICD-10-CM | POA: Insufficient documentation

## 2021-02-23 DIAGNOSIS — Z7901 Long term (current) use of anticoagulants: Secondary | ICD-10-CM | POA: Insufficient documentation

## 2021-02-23 DIAGNOSIS — Z8249 Family history of ischemic heart disease and other diseases of the circulatory system: Secondary | ICD-10-CM | POA: Insufficient documentation

## 2021-02-23 HISTORY — PX: CARDIOVERSION: SHX1299

## 2021-02-23 LAB — GLUCOSE, CAPILLARY
Glucose-Capillary: 72 mg/dL (ref 70–99)
Glucose-Capillary: 84 mg/dL (ref 70–99)

## 2021-02-23 SURGERY — CARDIOVERSION
Anesthesia: General

## 2021-02-23 MED ORDER — PROPOFOL 10 MG/ML IV BOLUS
INTRAVENOUS | Status: AC
  Filled 2021-02-23: qty 20

## 2021-02-23 MED ORDER — ONDANSETRON HCL 4 MG/2ML IJ SOLN: 4.0000 mg | Freq: Once | INTRAMUSCULAR | Status: DC | PRN

## 2021-02-23 MED ORDER — SODIUM CHLORIDE 0.9 % IV SOLN
INTRAVENOUS | Status: DC | PRN
  Administered 2021-02-23: 1000 mL via INTRAVENOUS

## 2021-02-23 MED ORDER — PROPOFOL 10 MG/ML IV BOLUS
INTRAVENOUS | Status: DC | PRN
  Administered 2021-02-23 (×3): 30 mg via INTRAVENOUS

## 2021-02-23 NOTE — Transfer of Care (Signed)
Immediate Anesthesia Transfer of Care Note  Patient: Marc [name]  Procedure(s) Performed: CARDIOVERSION  Patient Location: PACU  Anesthesia Type:MAC  Level of Consciousness: awake, alert  and oriented  Airway & Oxygen Therapy: Patient Spontanous Breathing and Patient connected to nasal cannula oxygen  Post-op Assessment: Report given to RN and Post -op Vital signs reviewed and stable  Post vital signs: Reviewed and stable  Last Vitals:  Vitals Value Taken Time  BP    Temp    Pulse 47 02/23/21 0754  Resp 25 02/23/21 0754  SpO2 95 % 02/23/21 0754  Vitals shown include unvalidated device data.  Last Pain:  Vitals:   02/23/21 0723  TempSrc: Oral  PainSc: 0-No pain         Complications: No notable events documented.

## 2021-02-23 NOTE — Anesthesia Preprocedure Evaluation (Deleted)
Anesthesia Evaluation    Airway        Dental   Pulmonary           Cardiovascular      Neuro/Psych    GI/Hepatic   Endo/Other    Renal/GU      Musculoskeletal   Abdominal   Peds  Hematology   Anesthesia Other Findings   Reproductive/Obstetrics                             Anesthesia Physical  Anesthesia Plan  ASA: III  Anesthesia Plan: General LMA   Post-op Pain Management:    Induction:   PONV Risk Score and Plan: Dexamethasone, Ondansetron and Treatment may vary due to age or medical condition  Airway Management Planned:   Additional Equipment:   Intra-op Plan:   Post-operative Plan:   Informed Consent: I have reviewed the patients History and Physical, chart, labs and discussed the procedure including the risks, benefits and alternatives for the proposed anesthesia with the patient or authorized representative who has indicated his/her understanding and acceptance.     Dental Advisory Given  Plan Discussed with: Anesthesiologist, CRNA and Surgeon  Anesthesia Plan Comments:         Anesthesia Quick Evaluation                                  Anesthesia Evaluation  Patient identified by MRN, date of birth, ID band Patient awake    Reviewed: Allergy & Precautions, H&P , NPO status , Patient's Chart, lab work & pertinent test results, reviewed documented beta blocker date and time   History of Anesthesia Complications Negative for: history of anesthetic complications  Airway Mallampati: III  TM Distance: <3 FB Neck ROM: limited    Dental no notable dental hx. (+) Poor Dentition, Chipped, Dental Advisory Given   Pulmonary neg sleep apnea, neg COPD, Current Smoker and Patient abstained from smoking.,    Pulmonary exam normal breath sounds clear to auscultation       Cardiovascular Exercise Tolerance: Good METShypertension, + CAD, + Past MI, +  CABG, + Peripheral Vascular Disease and +CHF  + dysrhythmias  Rhythm:Regular Rate:Normal - Systolic murmurs 04/5026 ECHO Study Conclusions   - Left ventricle: The cavity size was mildly dilated. There was  mild concentric hypertrophy. Systolic function was mildly to  moderately reduced. The estimated ejection fraction was in the  range of 40% to 45%. Probable hypokinesis of the  mid-apicalanteroseptal, anterior, and apical myocardium. Doppler  parameters are consistent with abnormal left ventricular  relaxation (grade 1 diastolic dysfunction).  - Left atrium: The atrium was mildly dilated.  - Right atrium: The atrium was mildly dilated.   CABG 2015 1 AVb on EKG, PVC   Neuro/Psych PSYCHIATRIC DISORDERS Anxiety negative neurological ROS     GI/Hepatic neg GERD  ,(+)     (-) substance abuse  ,   Endo/Other  diabetes  Renal/GU negative Renal ROS     Musculoskeletal   Abdominal   Peds  Hematology   Anesthesia Other Findings Past Medical History: No date: Anxiety No date: Cardiomyopathy [facility])     Comment:  45-50% No date: CHF (congestive heart failure) (HCC) 07/2013: Chronic systolic heart failure (HCC)     Comment:  Ejection fraction of 45-50% with possible inferior wall  hypokinesis No date: Coronary artery disease     Comment:  Significant left main and Severe three-vessel coronary               artery disease in December of 2014. He underwent CABG in               January of 2015 with LIMA to LAD, sequential SVG to               ramus/OM 2 and SVG to OM 3 No date: Diabetes mellitus without complication (HCC) No date: Dysrhythmia No date: Hyperlipidemia No date: Hypertension No date: Medical non-compliance No date: MI (myocardial infarction) (HCC) No date: Obesity No date: Tobacco abuse  Past Surgical History: 02/29/2016: AMPUTATION TOE; Right     Comment:  Procedure: AMPUTATION TOE;  Surgeon: Linus Galas, DPM;                 Location: [facility] ORS;  Service: Podiatry;  Laterality:               Right;  Great toe No date: CARDIAC CATHETERIZATION     Comment:  [facility] no stent 09/16/2013: CORONARY ARTERY BYPASS GRAFT; N/A     Comment:  Procedure: CORONARY ARTERY BYPASS GRAFTING (CABG);                Surgeon: [name], MD;  Location: [facility] OR;  Service:               Open Heart Surgery;  Laterality: N/A;  CABG x four, using              left internal mammary artery and right leg greater               saphenous vein 09/16/2013: INTRAOPERATIVE TRANSESOPHAGEAL ECHOCARDIOGRAM; N/A     Comment:  Procedure: INTRAOPERATIVE TRANSESOPHAGEAL               ECHOCARDIOGRAM;  Surgeon: [name], MD;  [facility] OR;  Service: Open Heart Surgery;  Laterality: N/A; No date: knee surgery; Right No date: LASIK; Bilateral  BMI    Body Mass Index: 32.32 kg/m      Reproductive/Obstetrics                            Anesthesia Physical Anesthesia Plan  ASA: III  Anesthesia Plan: General   Post-op Pain Management:    Induction: Intravenous  PONV Risk Score and Plan: 1 and Treatment may vary due to age or medical condition, Ondansetron and Dexamethasone  Airway Management Planned: LMA  Additional Equipment: None  Intra-op Plan:   Post-operative Plan: Extubation in OR  Informed Consent: I have reviewed the patients History and Physical, chart, labs and discussed the procedure including the risks, benefits and alternatives for the proposed anesthesia with the patient or authorized representative who has indicated his/her understanding and acceptance.     Dental Advisory Given  Plan Discussed with: CRNA  Anesthesia Plan Comments: (Discussed risks of anesthesia with patient, including PONV, sore throat, lip/dental damage. Rare risks discussed as well, such as cardiorespiratory and neurological sequelae. Patient understands.)       Anesthesia Quick Evaluation

## 2021-02-23 NOTE — Interval H&P Note (Signed)
History and Physical Interval Note:  02/23/2021 7:53 AM  Marc [name]  has presented today for surgery, with the diagnosis of Afib.  The various methods of treatment have been discussed with the patient and family. After consideration of risks, benefits and other options for treatment, the patient has consented to  Procedure(s): CARDIOVERSION (N/A) as a surgical intervention.  The patient's history has been reviewed, patient examined, no change in status, stable for surgery.  I have reviewed the patient's chart and labs.  Questions were answered to the patient's satisfaction.     [name]

## 2021-02-23 NOTE — Procedures (Signed)
Cardioversion procedure note For atrial fibrillation.  Procedure Details:  Consent: Risks of procedure as well as the alternatives and risks of each were explained to the (patient/caregiver).  Consent for procedure obtained.  Time Out: Verified patient identification, verified procedure, site/side was marked, verified correct patient position, special equipment/implants available, medications/allergies/relevent history reviewed, required imaging and test results available.  Performed  Patient placed on cardiac monitor, pulse oximetry, supplemental oxygen as necessary.   Sedation given: propofol IV, per anesthesia team  Pacer pads placed anterior and posterior chest.   Cardioverted 2 time(s).   Cardioverted at  200J. Synchronized biphasic Converted to NSR   Evaluation: Findings: Post procedure EKG shows: NSR Complications: None Patient did tolerate procedure well.  Time Spent Directly with the Patient:  35 minutes   Debbe Odea, M.D.

## 2021-02-23 NOTE — Anesthesia Postprocedure Evaluation (Signed)
Anesthesia Post Note  Patient: BURECH MCFARLAND  Procedure(s) Performed: CARDIOVERSION  Patient location during evaluation: Specials Recovery Anesthesia Type: General Level of consciousness: awake and alert Pain management: pain level controlled Vital Signs Assessment: post-procedure vital signs reviewed and stable Respiratory status: spontaneous breathing, nonlabored ventilation, respiratory function stable and patient connected to nasal cannula oxygen Cardiovascular status: blood pressure returned to baseline and stable Postop Assessment: no apparent nausea or vomiting Anesthetic complications: no   No notable events documented.   Last Vitals:  Vitals:   02/23/21 0752 02/23/21 0753  BP:    Pulse: 89 (!) 46  Resp: (!) 29 (!) 21  Temp:    SpO2: 90% (!) 88%    Last Pain:  Vitals:   02/23/21 0723  TempSrc: Oral  PainSc: 0-No pain                 Corinda Gubler

## 2021-02-23 NOTE — Anesthesia Preprocedure Evaluation (Signed)
Anesthesia Evaluation  Patient identified by MRN, date of birth, ID band Patient awake    Reviewed: Allergy & Precautions, H&P , NPO status , Patient's Chart, lab work & pertinent test results, reviewed documented beta blocker date and time   History of Anesthesia Complications Negative for: history of anesthetic complications  Airway Mallampati: III  TM Distance: <3 FB Neck ROM: limited    Dental no notable dental hx. (+) Poor Dentition, Chipped, Dental Advisory Given   Pulmonary shortness of breath and with exertion, neg sleep apnea, neg COPD, Patient abstained from smoking.Not current smoker, former smoker,  Quit smoking two weeks ago   Pulmonary exam normal breath sounds clear to auscultation       Cardiovascular Exercise Tolerance: Poor METShypertension, + CAD, + Past MI, + CABG, + Peripheral Vascular Disease and +CHF  + dysrhythmias Atrial Fibrillation  Rhythm:Regular Rate:Normal - Systolic murmurs TTE 2021: 1. Left ventricular ejection fraction, by estimation, is 40 to 45%. The  left ventricle has mildly decreased function. The left ventricle  demonstrates global hypokinesis. The left ventricular internal cavity size  was moderately dilated. There is moderate  left ventricular hypertrophy. Left ventricular diastolic parameters are  consistent with Grade I diastolic dysfunction (impaired relaxation).  2. Right ventricular systolic function is moderately reduced. The right  ventricular size is normal. Mildly increased right ventricular wall  thickness. There is normal pulmonary artery systolic pressure.  3. Left atrial size was mildly dilated.  4. Right atrial size was mildly dilated.  5. The mitral valve is grossly normal. Trivial mitral valve  regurgitation.  6. The aortic valve is tricuspid. There is mild thickening of the aortic  valve. Aortic valve regurgitation is not visualized. Mild aortic valve  sclerosis is  present, with no evidence of aortic valve stenosis.  7. Aortic dilatation noted. There is mild dilatation of the transverse  aorta, measuring 34 mm.  8. The inferior vena cava is normal in size with greater than 50%  respiratory variability, suggesting right atrial pressure of 3 mmHg. Marland Kitchen   CABG 2015 1 AVb on EKG, PVC   Neuro/Psych PSYCHIATRIC DISORDERS Anxiety negative neurological ROS     GI/Hepatic neg GERD  ,(+)     (-) substance abuse  ,   Endo/Other  diabetesMorbid obesity  Renal/GU negative Renal ROS     Musculoskeletal   Abdominal   Peds  Hematology   Anesthesia Other Findings Past Medical History: No date: Anxiety No date: Cardiomyopathy [facility])     Comment:  45-50% No date: CHF (congestive heart failure) (HCC) 07/2013: Chronic systolic heart failure (HCC)     Comment:  Ejection fraction of 45-50% with possible inferior wall               hypokinesis No date: Coronary artery disease     Comment:  Significant left main and Severe three-vessel coronary               artery disease in December of 2014. He underwent CABG in               January of 2015 with LIMA to LAD, sequential SVG to               ramus/OM 2 and SVG to OM 3 No date: Diabetes mellitus without complication (HCC) No date: Dysrhythmia No date: Hyperlipidemia No date: Hypertension No date: Medical non-compliance No date: MI (myocardial infarction) (HCC) No date: Obesity No date: Tobacco abuse  Past Surgical History: 02/29/2016: AMPUTATION  TOE; Right     Comment:  Procedure: AMPUTATION TOE;  Surgeon: Linus Galas, DPM;                Location: [facility] ORS;  Service: Podiatry;  Laterality:               Right;  Great toe No date: CARDIAC CATHETERIZATION     Comment:  [facility] no stent 09/16/2013: CORONARY ARTERY BYPASS GRAFT; N/A     Comment:  Procedure: CORONARY ARTERY BYPASS GRAFTING (CABG);                Surgeon: [name], MD;  Location: [facility] OR;  Service:               Open Heart  Surgery;  Laterality: N/A;  CABG x four, using              left internal mammary artery and right leg greater               saphenous vein 09/16/2013: INTRAOPERATIVE TRANSESOPHAGEAL ECHOCARDIOGRAM; N/A     Comment:  Procedure: INTRAOPERATIVE TRANSESOPHAGEAL               ECHOCARDIOGRAM;  Surgeon: [name], MD;  Location:               [facility] OR;  Service: Open Heart Surgery;  Laterality: N/A; No date: knee surgery; Right No date: LASIK; Bilateral  BMI    Body Mass Index: 32.32 kg/m      Reproductive/Obstetrics                             Anesthesia Physical  Anesthesia Plan  ASA: 3  Anesthesia Plan: General   Post-op Pain Management:    Induction: Intravenous  PONV Risk Score and Plan: 2 and Treatment may vary due to age or medical condition, Ondansetron, Propofol infusion and TIVA  Airway Management Planned: Natural Airway  Additional Equipment: None  Intra-op Plan:   Post-operative Plan:   Informed Consent: I have reviewed the patients History and Physical, chart, labs and discussed the procedure including the risks, benefits and alternatives for the proposed anesthesia with the patient or authorized representative who has indicated his/her understanding and acceptance.     Dental Advisory Given  Plan Discussed with: CRNA  Anesthesia Plan Comments: (Discussed risks of anesthesia with patient, including PONV, sore throat, lip/dental damage. Rare risks discussed as well, such as cardiorespiratory and neurological sequelae. Patient understands.)        Anesthesia Quick Evaluation

## 2021-03-01 ENCOUNTER — Inpatient Hospital Stay
Admission: EM | Admit: 2021-03-01 | Discharge: 2021-03-08 | DRG: 280 | Disposition: A | Discharge status: Home Health | Payer: Medicaid Other | Attending: Internal Medicine | Admitting: Internal Medicine

## 2021-03-01 ENCOUNTER — Inpatient Hospital Stay (HOSPITAL_COMMUNITY)
Admit: 2021-03-01 | Discharge: 2021-03-01 | Disposition: A | Discharge status: Home / Self Care | Payer: Medicaid Other | Attending: Internal Medicine | Admitting: Internal Medicine

## 2021-03-01 ENCOUNTER — Other Ambulatory Visit: Payer: Self-pay

## 2021-03-01 ENCOUNTER — Emergency Department: Payer: Medicaid Other

## 2021-03-01 ENCOUNTER — Encounter: Payer: Self-pay | Admitting: Internal Medicine

## 2021-03-01 DIAGNOSIS — E119 Type 2 diabetes mellitus without complications: Secondary | ICD-10-CM

## 2021-03-01 DIAGNOSIS — I5023 Acute on chronic systolic (congestive) heart failure: Secondary | ICD-10-CM | POA: Diagnosis not present

## 2021-03-01 DIAGNOSIS — Z20822 Contact with and (suspected) exposure to covid-19: Secondary | ICD-10-CM | POA: Diagnosis present

## 2021-03-01 DIAGNOSIS — E1165 Type 2 diabetes mellitus with hyperglycemia: Secondary | ICD-10-CM | POA: Diagnosis not present

## 2021-03-01 DIAGNOSIS — R0602 Shortness of breath: Secondary | ICD-10-CM | POA: Diagnosis present

## 2021-03-01 DIAGNOSIS — I509 Heart failure, unspecified: Secondary | ICD-10-CM | POA: Diagnosis not present

## 2021-03-01 DIAGNOSIS — N1831 Chronic kidney disease, stage 3a: Secondary | ICD-10-CM | POA: Diagnosis present

## 2021-03-01 DIAGNOSIS — Z8249 Family history of ischemic heart disease and other diseases of the circulatory system: Secondary | ICD-10-CM

## 2021-03-01 DIAGNOSIS — I25810 Atherosclerosis of coronary artery bypass graft(s) without angina pectoris: Secondary | ICD-10-CM | POA: Diagnosis present

## 2021-03-01 DIAGNOSIS — E785 Hyperlipidemia, unspecified: Secondary | ICD-10-CM | POA: Diagnosis present

## 2021-03-01 DIAGNOSIS — I4510 Unspecified right bundle-branch block: Secondary | ICD-10-CM | POA: Diagnosis present

## 2021-03-01 DIAGNOSIS — I255 Ischemic cardiomyopathy: Secondary | ICD-10-CM | POA: Diagnosis present

## 2021-03-01 DIAGNOSIS — I4819 Other persistent atrial fibrillation: Secondary | ICD-10-CM | POA: Diagnosis present

## 2021-03-01 DIAGNOSIS — E1169 Type 2 diabetes mellitus with other specified complication: Secondary | ICD-10-CM

## 2021-03-01 DIAGNOSIS — I5021 Acute systolic (congestive) heart failure: Secondary | ICD-10-CM | POA: Diagnosis not present

## 2021-03-01 DIAGNOSIS — I25118 Atherosclerotic heart disease of native coronary artery with other forms of angina pectoris: Secondary | ICD-10-CM | POA: Diagnosis not present

## 2021-03-01 DIAGNOSIS — I2510 Atherosclerotic heart disease of native coronary artery without angina pectoris: Secondary | ICD-10-CM | POA: Diagnosis present

## 2021-03-01 DIAGNOSIS — E1122 Type 2 diabetes mellitus with diabetic chronic kidney disease: Secondary | ICD-10-CM | POA: Diagnosis present

## 2021-03-01 DIAGNOSIS — I10 Essential (primary) hypertension: Secondary | ICD-10-CM | POA: Diagnosis present

## 2021-03-01 DIAGNOSIS — I480 Paroxysmal atrial fibrillation: Secondary | ICD-10-CM | POA: Diagnosis not present

## 2021-03-01 DIAGNOSIS — I4891 Unspecified atrial fibrillation: Secondary | ICD-10-CM | POA: Diagnosis present

## 2021-03-01 DIAGNOSIS — E8881 Metabolic syndrome: Secondary | ICD-10-CM | POA: Diagnosis present

## 2021-03-01 DIAGNOSIS — I214 Non-ST elevation (NSTEMI) myocardial infarction: Secondary | ICD-10-CM | POA: Diagnosis present

## 2021-03-01 DIAGNOSIS — I5043 Acute on chronic combined systolic (congestive) and diastolic (congestive) heart failure: Secondary | ICD-10-CM | POA: Diagnosis present

## 2021-03-01 DIAGNOSIS — I358 Other nonrheumatic aortic valve disorders: Secondary | ICD-10-CM | POA: Diagnosis present

## 2021-03-01 DIAGNOSIS — Z87891 Personal history of nicotine dependence: Secondary | ICD-10-CM

## 2021-03-01 DIAGNOSIS — F419 Anxiety disorder, unspecified: Secondary | ICD-10-CM | POA: Diagnosis present

## 2021-03-01 DIAGNOSIS — Z7901 Long term (current) use of anticoagulants: Secondary | ICD-10-CM

## 2021-03-01 DIAGNOSIS — Z6834 Body mass index (BMI) 34.0-34.9, adult: Secondary | ICD-10-CM

## 2021-03-01 DIAGNOSIS — I130 Hypertensive heart and chronic kidney disease with heart failure and stage 1 through stage 4 chronic kidney disease, or unspecified chronic kidney disease: Secondary | ICD-10-CM | POA: Diagnosis present

## 2021-03-01 DIAGNOSIS — E876 Hypokalemia: Secondary | ICD-10-CM | POA: Diagnosis present

## 2021-03-01 DIAGNOSIS — Z794 Long term (current) use of insulin: Secondary | ICD-10-CM | POA: Diagnosis not present

## 2021-03-01 DIAGNOSIS — I252 Old myocardial infarction: Secondary | ICD-10-CM

## 2021-03-01 DIAGNOSIS — N179 Acute kidney failure, unspecified: Secondary | ICD-10-CM | POA: Diagnosis not present

## 2021-03-01 DIAGNOSIS — T502X5A Adverse effect of carbonic-anhydrase inhibitors, benzothiadiazides and other diuretics, initial encounter: Secondary | ICD-10-CM | POA: Diagnosis present

## 2021-03-01 DIAGNOSIS — Z7984 Long term (current) use of oral hypoglycemic drugs: Secondary | ICD-10-CM

## 2021-03-01 DIAGNOSIS — E1151 Type 2 diabetes mellitus with diabetic peripheral angiopathy without gangrene: Secondary | ICD-10-CM | POA: Diagnosis present

## 2021-03-01 DIAGNOSIS — Z79899 Other long term (current) drug therapy: Secondary | ICD-10-CM

## 2021-03-01 DIAGNOSIS — J9601 Acute respiratory failure with hypoxia: Secondary | ICD-10-CM | POA: Diagnosis present

## 2021-03-01 LAB — TROPONIN I (HIGH SENSITIVITY)
Troponin I (High Sensitivity): 36 ng/L — ABNORMAL HIGH (ref <18)
Troponin I (High Sensitivity): 190 ng/L
Troponin I (High Sensitivity): 1214 ng/L (ref <18)
Troponin I (High Sensitivity): 3688 ng/L (ref <18)
Troponin I (High Sensitivity): 3665 ng/L (ref <18)

## 2021-03-01 LAB — CBC WITH DIFFERENTIAL/PLATELET
Abs Immature Granulocytes: 0.04 10*3/uL (ref 0.00–0.07)
Basophils Absolute: 0.1 10*3/uL (ref 0.0–0.1)
Basophils Relative: 1 %
Eosinophils Absolute: 0.3 10*3/uL (ref 0.0–0.5)
Eosinophils Relative: 3 %
HCT: 31.8 % — ABNORMAL LOW (ref 39.0–52.0)
Hemoglobin: 10.9 g/dL — ABNORMAL LOW (ref 13.0–17.0)
Immature Granulocytes: 0 %
Lymphocytes Relative: 8 %
Lymphs Abs: 0.8 10*3/uL (ref 0.7–4.0)
MCH: 31.7 pg (ref 26.0–34.0)
MCHC: 34.3 g/dL (ref 30.0–36.0)
MCV: 92.4 fL (ref 80.0–100.0)
Monocytes Absolute: 0.6 10*3/uL (ref 0.1–1.0)
Monocytes Relative: 5 %
Neutro Abs: 8.7 10*3/uL — ABNORMAL HIGH (ref 1.7–7.7)
Neutrophils Relative %: 83 %
Platelets: 225 10*3/uL (ref 150–400)
RBC: 3.44 MIL/uL — ABNORMAL LOW (ref 4.22–5.81)
RDW: 13.5 % (ref 11.5–15.5)
WBC: 10.5 10*3/uL (ref 4.0–10.5)
nRBC: 0 % (ref 0.0–0.2)

## 2021-03-01 LAB — BLOOD GAS, ARTERIAL
Acid-Base Excess: 1.9 mmol/L (ref 0.0–2.0)
Bicarbonate: 26.6 mmol/L (ref 20.0–28.0)
FIO2: 0.36
O2 Saturation: 96.5 %
Patient temperature: 37
pCO2 arterial: 41 mmHg (ref 32.0–48.0)
pH, Arterial: 7.42 (ref 7.350–7.450)
pO2, Arterial: 84 mmHg (ref 83.0–108.0)

## 2021-03-01 LAB — ECHOCARDIOGRAM COMPLETE
Height: 73 in
S' Lateral: 3.82 cm
Weight: 4160 oz

## 2021-03-01 LAB — GLUCOSE, CAPILLARY
Glucose-Capillary: 241 mg/dL — ABNORMAL HIGH (ref 70–99)
Glucose-Capillary: 182 mg/dL — ABNORMAL HIGH (ref 70–99)

## 2021-03-01 LAB — BASIC METABOLIC PANEL
Anion gap: 9 (ref 5–15)
BUN: 16 mg/dL (ref 8–23)
CO2: 26 mmol/L (ref 22–32)
Calcium: 9 mg/dL (ref 8.9–10.3)
Chloride: 103 mmol/L (ref 98–111)
Creatinine, Ser: 1.11 mg/dL (ref 0.61–1.24)
GFR, Estimated: >60 mL/min (ref >60)
Glucose, Bld: 167 mg/dL — ABNORMAL HIGH (ref 70–99)
Potassium: 3.1 mmol/L — ABNORMAL LOW (ref 3.5–5.1)
Sodium: 138 mmol/L (ref 135–145)

## 2021-03-01 LAB — MAGNESIUM: Magnesium: 1.8 mg/dL (ref 1.7–2.4)

## 2021-03-01 LAB — BRAIN NATRIURETIC PEPTIDE: B Natriuretic Peptide: 920.7 pg/mL — ABNORMAL HIGH (ref 0.0–100.0)

## 2021-03-01 LAB — APTT
aPTT: 36 s (ref 24–36)
aPTT: 74 seconds — ABNORMAL HIGH (ref 24–36)
aPTT: 59 seconds — ABNORMAL HIGH (ref 24–36)

## 2021-03-01 LAB — CBG MONITORING, ED: Glucose-Capillary: 226 mg/dL — ABNORMAL HIGH (ref 70–99)

## 2021-03-01 LAB — HEPARIN LEVEL (UNFRACTIONATED): Heparin Unfractionated: 0.77 IU/mL — ABNORMAL HIGH (ref 0.30–0.70)

## 2021-03-01 LAB — RESP PANEL BY RT-PCR (FLU A&B, COVID) ARPGX2
Influenza A by PCR: NEGATIVE
Influenza B by PCR: NEGATIVE
SARS Coronavirus 2 by RT PCR: NEGATIVE

## 2021-03-01 MED ORDER — SODIUM CHLORIDE 0.9 % IV SOLN: 250.0000 mL | INTRAVENOUS | Status: DC | PRN

## 2021-03-01 MED ORDER — SODIUM CHLORIDE 0.9% FLUSH: 3.0000 mL | INTRAVENOUS | Status: DC | PRN

## 2021-03-01 MED ORDER — ATORVASTATIN CALCIUM 80 MG PO TABS
80.0000 mg | ORAL_TABLET | Freq: Every evening | ORAL | Status: DC
  Administered 2021-03-01 – 2021-03-07 (×7): 80 mg via ORAL
  Filled 2021-03-01 (×8): qty 1

## 2021-03-01 MED ORDER — NAPHAZOLINE-PHENIRAMINE 0.025-0.3 % OP SOLN
1.0000 [drp] | Freq: Four times a day (QID) | OPHTHALMIC | Status: DC | PRN
  Filled 2021-03-01: qty 5

## 2021-03-01 MED ORDER — INSULIN ASPART 100 UNIT/ML IJ SOLN
0.0000 [IU] | Freq: Three times a day (TID) | INTRAMUSCULAR | Status: DC
  Administered 2021-03-01 (×2): 5 [IU] via SUBCUTANEOUS
  Administered 2021-03-02: 3 [IU] via SUBCUTANEOUS
  Administered 2021-03-02: 11 [IU] via SUBCUTANEOUS
  Administered 2021-03-02: 3 [IU] via SUBCUTANEOUS
  Administered 2021-03-03 (×3): 8 [IU] via SUBCUTANEOUS
  Administered 2021-03-04: 11 [IU] via SUBCUTANEOUS
  Administered 2021-03-04: 15 [IU] via SUBCUTANEOUS
  Filled 2021-03-01 (×10): qty 1

## 2021-03-01 MED ORDER — SODIUM CHLORIDE 0.9% FLUSH
3.0000 mL | Freq: Two times a day (BID) | INTRAVENOUS | Status: DC
  Administered 2021-03-01 – 2021-03-04 (×5): 3 mL via INTRAVENOUS

## 2021-03-01 MED ORDER — FUROSEMIDE 10 MG/ML IJ SOLN
40.0000 mg | Freq: Once | INTRAMUSCULAR | Status: AC
  Administered 2021-03-01: 40 mg via INTRAVENOUS
  Filled 2021-03-01: qty 4

## 2021-03-01 MED ORDER — HEPARIN (PORCINE) 25000 UT/250ML-% IV SOLN
1600.0000 [IU]/h | INTRAVENOUS | Status: DC
  Administered 2021-03-01: 1400 [IU]/h via INTRAVENOUS
  Administered 2021-03-01 – 2021-03-03 (×3): 1600 [IU]/h via INTRAVENOUS
  Filled 2021-03-01 (×5): qty 250

## 2021-03-01 MED ORDER — POTASSIUM CHLORIDE CRYS ER 20 MEQ PO TBCR
40.0000 meq | EXTENDED_RELEASE_TABLET | Freq: Two times a day (BID) | ORAL | Status: AC
  Administered 2021-03-01 (×2): 40 meq via ORAL
  Filled 2021-03-01 (×2): qty 2

## 2021-03-01 MED ORDER — ACETAMINOPHEN 500 MG PO TABS
1000.0000 mg | ORAL_TABLET | Freq: Four times a day (QID) | ORAL | Status: DC | PRN
  Administered 2021-03-01: 1000 mg via ORAL
  Filled 2021-03-01: qty 2

## 2021-03-01 MED ORDER — HEPARIN BOLUS VIA INFUSION
1500.0000 [IU] | Freq: Once | INTRAVENOUS | Status: AC
  Administered 2021-03-01: 1500 [IU] via INTRAVENOUS
  Filled 2021-03-01: qty 1500

## 2021-03-01 MED ORDER — SALINE SPRAY 0.65 % NA SOLN
1.0000 | NASAL | Status: DC | PRN
  Administered 2021-03-01: 1 via NASAL
  Filled 2021-03-01: qty 44

## 2021-03-01 MED ORDER — ONDANSETRON HCL 4 MG/2ML IJ SOLN: 4.0000 mg | Freq: Four times a day (QID) | INTRAMUSCULAR | Status: DC | PRN

## 2021-03-01 MED ORDER — LISINOPRIL 5 MG PO TABS
5.0000 mg | ORAL_TABLET | Freq: Every day | ORAL | Status: DC
  Administered 2021-03-01 – 2021-03-03 (×3): 5 mg via ORAL
  Filled 2021-03-01 (×3): qty 1

## 2021-03-01 MED ORDER — POTASSIUM CHLORIDE CRYS ER 10 MEQ PO TBCR
10.0000 meq | EXTENDED_RELEASE_TABLET | Freq: Every day | ORAL | Status: DC
  Administered 2021-03-01: 10 meq via ORAL
  Filled 2021-03-01: qty 1

## 2021-03-01 MED ORDER — FUROSEMIDE 10 MG/ML IJ SOLN
40.0000 mg | Freq: Two times a day (BID) | INTRAMUSCULAR | Status: DC
  Administered 2021-03-01 – 2021-03-03 (×5): 40 mg via INTRAVENOUS
  Filled 2021-03-01 (×5): qty 4

## 2021-03-01 MED ORDER — ASPIRIN EC 81 MG PO TBEC
81.0000 mg | DELAYED_RELEASE_TABLET | Freq: Every day | ORAL | Status: DC
  Administered 2021-03-02 – 2021-03-08 (×7): 81 mg via ORAL
  Filled 2021-03-01 (×7): qty 1

## 2021-03-01 MED ORDER — ASPIRIN 81 MG PO CHEW
324.0000 mg | CHEWABLE_TABLET | Freq: Once | ORAL | Status: AC
  Administered 2021-03-01: 324 mg via ORAL
  Filled 2021-03-01: qty 4

## 2021-03-01 MED ORDER — HEPARIN BOLUS VIA INFUSION
4000.0000 [IU] | Freq: Once | INTRAVENOUS | Status: AC
  Administered 2021-03-01: 4000 [IU] via INTRAVENOUS
  Filled 2021-03-01: qty 4000

## 2021-03-01 MED ORDER — CARVEDILOL 25 MG PO TABS
25.0000 mg | ORAL_TABLET | Freq: Two times a day (BID) | ORAL | Status: DC
  Administered 2021-03-01 – 2021-03-08 (×15): 25 mg via ORAL
  Filled 2021-03-01 (×4): qty 1
  Filled 2021-03-01: qty 4
  Filled 2021-03-01 (×10): qty 1

## 2021-03-01 MED ORDER — LORAZEPAM 2 MG/ML IJ SOLN
0.5000 mg | Freq: Once | INTRAMUSCULAR | Status: AC
  Administered 2021-03-01: 0.5 mg via INTRAVENOUS
  Filled 2021-03-01: qty 1

## 2021-03-01 MED ORDER — HYDROXYZINE HCL 25 MG PO TABS
50.0000 mg | ORAL_TABLET | Freq: Four times a day (QID) | ORAL | Status: DC | PRN
  Administered 2021-03-01 – 2021-03-03 (×6): 50 mg via ORAL
  Filled 2021-03-01 (×6): qty 2

## 2021-03-01 MED ORDER — ISOSORB DINITRATE-HYDRALAZINE 20-37.5 MG PO TABS
1.0000 | ORAL_TABLET | Freq: Three times a day (TID) | ORAL | Status: DC
  Administered 2021-03-01 – 2021-03-08 (×20): 1 via ORAL
  Filled 2021-03-01 (×25): qty 1

## 2021-03-01 MED ORDER — APIXABAN 5 MG PO TABS: 5.0000 mg | ORAL_TABLET | Freq: Two times a day (BID) | ORAL | Status: DC

## 2021-03-01 NOTE — ED Notes (Signed)
Patient diuresis following the 40 mg of lasix was 400 of clear yellow urine

## 2021-03-01 NOTE — Consult Note (Signed)
Cardiology Consultation:   Patient ID: Marc [name]; [id number]; Jan 05, 1961   Admit date: 03/01/2021 Date of Consult: 03/01/2021  Primary Care Provider: [facility] Primary Cardiologist: Agbor-Etang Primary Electrophysiologist:  None   Patient Profile:   Marc [name] is a 60 y.o. male with a hx of CAD s/p 5-vessel CABG in 09/2013 with LIMA to LAD, sequential SVG to ramus/OM2, and SVG to OM3, chronic combined systolic and diastolic CHF, ICM, persistent Afib s/p recent briefly successful DCCV on 02/23/2021, PAD, DM, HTN, HLD, tobacco use, obesity, and hyperkalemia on ACEi and MRA who is being seen today for the evaluation of elevated troponin, persistent A. Fib, and acute on chronic combined systolic and diastolic CHF at the request of Dr. Joylene Igo.  History of Present Illness:   Marc [name] was admitted with an MI in 08/2013 with LHC at that time showing multivessel CAD as outlined below with an EF of 35%. He subsequently underwent 5-vessel CABG in 09/2013. Despite surgical revascularization and medical therapy, his cardiomyopathy has persisted with echo in 03/2018 showing an EF of 40-45% with probable hypokinesis of the mid-apicalanteroseptal, anterior, and apical myocardium, Gr1DD, and mild biatrial enlargement. Most recent echo from 06/2020 showed an EF of 40-45%, global hypokinesis, moderate LVH, Gr1DD, moderately reduced RVSF with normal ventricular cavity size and mildly increased wall thickness, normal PASP, mild biatrial enlargement, trivial MR, mild aortic valve sclerosis without stenosis, mild dilatation of the transverse aorta measuring 34 mm and an estimated right atrial pressure of 3 mmHg.   More recently, he was admitted in 12/2020 with generalized weakness and found to be hyperkalemic with a potassium > 7.5, felt to be related to TMP-SMX use in combination with spironolactone and lisinopril. No EKG. He was treated with Kayexalate and advised to stop lisinopril  and spironolactone. He was readmitted in 01/2021 with nausea and vomiting, and again found to be hyperkalemic at 7.3 requiring insulin, D50, calcium gluconate, and Kayexalate. He indicated he had resumed both lisinopril and spironolactone as he was not clear how long he was to hold these medications. These medications were held again. His renal function was above baseline, and he was advised to use Lasix on a prn basis. During the above admission in 01/2021, he was found to be in Afib. He was not started on anticoagulation. He was seen in the office in late 01/2021, and remained in Afib. He was started on Eliquis.  There were concerns A. fib was driving worsening heart failure given a weight trend from 249 pounds in the office in 06/2020 with a weight of 269 pounds when he was seen on 520/2022.  When he was seen in the office on 02/10/2021 he remained volume up and was started on torsemide 40 mg daily in place of Lasix as well as BiDil and continued on carvedilol.  Amlodipine was discontinued.  He subsequently underwent successful DCCV on 02/23/2021 following adequate anticoagulation.  Following this, he did report he noted some improvement in the way he felt overall though does have a difficult time explaining this further.  However, he did continue to note bilateral lower extremity swelling which has been present for approximately 6 to 8 months.  He was in his usual state of health up until the early morning hours of 6/28 when he developed sudden onset of shortness of breath and a sensation of "not feeling well".  He denied any chest pain, palpitations, dizziness, presyncope, syncope, or worsening orthopnea.  He does feel like  his lower extremity swelling is a little worse.  He has not missed any doses of anticoagulation.  Upon presentation to [facility] his BP was elevated at 159/81.  EKG showed A. fib with an underlying RBBB.  CXR showed chronic cardiomegaly with lower lung volumes and mildly increased interstitial  markings since imaging in 01/2021.  Initial high-sensitivity troponin 36 with a delta troponin of 190 subsequently trending to 1214.  BNP 920 with a previously noted value of 441 in 01/2021.  COVID and influenza negative.  Hgb 10.9.  Potassium 3.1, BUN 16, serum creatinine 1.11, magnesium 1.8.  In the ED, he has received ASA 324 mg x 1, IV Lasix 40 mg x 2, KCl 10 mEq x1, PTA BiDil and carvedilol.  He remains on supplemental oxygen via nasal cannula at 2 L.  BP currently 130/90.  He remains in A. fib with controlled ventricular response.  Documented urine output of 400 mL in the ED.  Weight documented at 117.9 kg, though uncertain of accuracy given his last clinic office weight was 128.6 kg.  He continues to note dyspnea.   Past Medical History:  Diagnosis Date   Anxiety    Chronic systolic heart failure (HCC)    a. 07/2013 Echo: Ejection fraction of 45-50% with possible inferior wall hypokinesis; b. 03/2018 Echo: EF 40-45%; c. 06/2020 Echo: EF 40-45%, glob HK. Mod LVH, gr1 DD. Nl PASP. Mild BAE. Triv MR. Mild AoV sclerosis. Mild Ao dil (93mm).   Coronary artery disease    Significant left main and Severe three-vessel coronary artery disease in December of 2014. He underwent CABG in January of 2015 with LIMA to LAD, sequential SVG to ramus/OM 2 and SVG to OM 3   Diabetes mellitus without complication (HCC)    Hyperlipidemia    Hypertension    Ischemic cardiomyopathy    a. 07/2013 Echo: EF 45-50%; b. 03/2018 Echo: EF 40-45%; c. 06/2020 Echo: EF 40-45%.   Medical non-compliance    MI (myocardial infarction) (HCC)    Obesity    PAD (peripheral artery disease) (HCC)    a. 04/2020 PTA of L PT.   PAF (paroxysmal atrial fibrillation) (HCC)    Tobacco abuse     Past Surgical History:  Procedure Laterality Date   AMPUTATION Left 04/08/2020   Procedure: AMPUTATION RAY;  Surgeon: Rosetta Posner, DPM;  Location: [facility] ORS;  Service: Podiatry;  Laterality: Left;   AMPUTATION Left 06/25/2020   Procedure:  AMPUTATION RAY 1ST RAY PARTIAL LEFT;  Surgeon: Rosetta Posner, DPM;  Location: [facility] ORS;  Service: Podiatry;  Laterality: Left;   AMPUTATION TOE Right 02/29/2016   Procedure: AMPUTATION TOE;  Surgeon: Linus Galas, DPM;  Location: [facility] ORS;  Service: Podiatry;  Laterality: Right;  Great toe   CARDIAC CATHETERIZATION     [facility] no stent   CARDIOVERSION N/A 02/23/2021   Procedure: CARDIOVERSION;  Surgeon: Debbe Odea, MD;  Location: [facility] ORS;  Service: Cardiovascular;  Laterality: N/A;   CORONARY ARTERY BYPASS GRAFT N/A 09/16/2013   Procedure: CORONARY ARTERY BYPASS GRAFTING (CABG);  Surgeon: [name], MD;  Location[facility];  Service: Open Heart Surgery;  Laterality: N/A;  CABG x four, using left internal mammary artery and right leg greater saphenous vein   INTRAOPERATIVE TRANSESOPHAGEAL ECHOCARDIOGRAM N/A 09/16/2013   Procedure: INTRAOPERATIVE TRANSESOPHAGEAL ECHOCARDIOGRAM;  Surgeon: [name], MD;  Location: [facility] OR;  Service: Open Heart Surgery;  Laterality: N/A;   knee surgery Right    LASIK Bilateral    LOWER  EXTREMITY ANGIOGRAPHY Left 04/07/2020   Procedure: Lower Extremity Angiography;  Surgeon: Renford Dills, MD;  Location: [facility] INVASIVE CV LAB;  Service: Cardiovascular;  Laterality: Left;   LOWER EXTREMITY ANGIOGRAPHY Left 04/09/2020   Procedure: Lower Extremity Angiography (Pedal Approach);  Surgeon: Renford Dills, MD;  Location: [facility] INVASIVE CV LAB;  Service: Cardiovascular;  Laterality: Left;     Home Meds: Prior to Admission medications   Medication Sig Start Date End Date Taking? Authorizing Provider  acetaminophen (TYLENOL) 500 MG tablet Take 1,000 mg by mouth every 6 (six) hours as needed for moderate pain or headache.   Yes [provider]  apixaban (ELIQUIS) 5 MG TABS tablet Take 1 tablet (5 mg total) by mouth 2 (two) times daily. 01/21/21  Yes Creig Hines, NP  atorvastatin (LIPITOR) 80 MG tablet Take 80 mg by mouth every evening.   01/31/18  Yes [provider]  calcium carbonate (TUMS - DOSED IN MG ELEMENTAL CALCIUM) 500 MG chewable tablet Chew 500 mg by mouth daily as needed for indigestion or heartburn.   Yes [provider]  carvedilol (COREG) 25 MG tablet Take 25 mg by mouth 2 (two) times daily.   Yes [provider]  cloNIDine (CATAPRES) 0.3 MG tablet Take 0.3 mg by mouth 2 (two) times daily.  01/24/18  Yes [provider]  glipiZIDE (GLUCOTROL XL) 10 MG 24 hr tablet Take 10 mg by mouth daily.  01/31/18  Yes [provider]  hydrOXYzine (ATARAX/VISTARIL) 50 MG tablet Take 50 mg by mouth 4 (four) times daily as needed for anxiety. 01/29/21  Yes [provider]  insulin glargine (LANTUS SOLOSTAR) 100 UNIT/ML Solostar Pen Inject 42 Units into the skin daily. 01/07/21  Yes Rai, Ripudeep K, MD  isosorbide-hydrALAZINE (BIDIL) 20-37.5 MG tablet Take 1 tablet by mouth 3 (three) times daily. 02/10/21  Yes Agbor-Etang, Arlys John, MD  Naphazoline-Pheniramine (VISINE-A OP) Place 1 drop into both eyes daily as needed (allergies).   Yes [provider]  potassium chloride (KLOR-CON) 10 MEQ tablet Take 10 mEq by mouth daily. 02/08/21  Yes [provider]  torsemide (DEMADEX) 20 MG tablet Take 2 tablets (40 mg total) by mouth daily. 02/10/21 05/11/21 Yes Debbe Odea, MD    Inpatient Medications: Scheduled Meds:  atorvastatin  80 mg Oral QPM   carvedilol  25 mg Oral BID   furosemide  40 mg Intravenous Q12H   insulin aspart  0-15 Units Subcutaneous TID WC   isosorbide-hydrALAZINE  1 tablet Oral TID   potassium chloride  10 mEq Oral Daily   sodium chloride flush  3 mL Intravenous Q12H   Continuous Infusions:  sodium chloride     heparin 1,400 Units/hr (03/01/21 0813)   PRN Meds: sodium chloride, acetaminophen, hydrOXYzine, naphazoline-pheniramine, ondansetron (ZOFRAN) IV, sodium chloride flush  Allergies:  No Known Allergies  Social History:   Social History    Socioeconomic History   Marital status: Divorced    Spouse name: Not on file   Number of children: Not on file   Years of education: Not on file   Highest education level: Not on file  Occupational History   Not on file  Tobacco Use   Smoking status: Former    Packs/day: 0.50    Years: 45.00    Pack years: 22.50    Types: Cigarettes    Quit date: 02/09/2021    Years since quitting: 0.0   Smokeless tobacco: Never   Tobacco comments:    trying to  quit  Vaping Use   Vaping Use: Never used  Substance and Sexual Activity   Alcohol use: No    Alcohol/week: 0.0 standard drinks   Drug use: No   Sexual activity: Not on file  Other Topics Concern   Not on file  Social History Narrative   Not on file   Social Determinants of Health   Financial Resource Strain: Not on file  Food Insecurity: Not on file  Transportation Needs: Not on file  Physical Activity: Not on file  Stress: Not on file  Social Connections: Not on file  Intimate Partner Violence: Not on file     Family History:   Family History  Problem Relation Age of Onset   Heart disease Mother     ROS:  Review of Systems  Constitutional:  Positive for malaise/fatigue. Negative for chills, diaphoresis, fever and weight loss.  HENT:  Negative for congestion.   Eyes:  Negative for discharge and redness.  Respiratory:  Positive for shortness of breath. Negative for cough, sputum production and wheezing.   Cardiovascular:  Positive for leg swelling. Negative for chest pain, palpitations, orthopnea, claudication and PND.  Gastrointestinal:  Negative for abdominal pain, blood in stool, heartburn, melena, nausea and vomiting.  Musculoskeletal:  Negative for falls and myalgias.  Skin:  Negative for rash.  Neurological:  Positive for weakness. Negative for dizziness, tingling, tremors, sensory change, speech change, focal weakness and loss of consciousness.  Endo/Heme/Allergies:  Does not bruise/bleed easily.   Psychiatric/Behavioral:  Negative for substance abuse. The patient is not nervous/anxious.   All other systems reviewed and are negative.    Physical Exam/Data:   Vitals:   03/01/21 0413 03/01/21 0554 03/01/21 0657 03/01/21 0815  BP: (!) 144/61 99/61 114/61 137/90  Pulse: 89 77 86 82  Resp: (!) 21 20 20 20   Temp:      TempSrc:      SpO2: 96% 96% 98% 95%  Weight:      Height:        Intake/Output Summary (Last 24 hours) at 03/01/2021 0955 Last data filed at 03/01/2021 0747 Gross per 24 hour  Intake --  Output 400 ml  Net -400 ml   Filed Weights   03/01/21 0327  Weight: 117.9 kg   Body mass index is 34.3 kg/m.   Physical Exam: General: Well developed, well nourished, in no acute distress. Head: Normocephalic, atraumatic, sclera non-icteric, no xanthomas, nares without discharge.  Neck: Negative for carotid bruits. JVD not elevated. Lungs: Diminished breath sounds bilaterally with bibasilar crackles. Breathing is unlabored. Heart: IRIR with S1 S2. I/VI systolic murmur LSB, no rubs, or gallops appreciated. Abdomen: Soft, non-tender, non-distended with normoactive bowel sounds. No hepatomegaly. No rebound/guarding. No obvious abdominal masses. Msk:  Strength and tone appear normal for age. Extremities: No clubbing or cyanosis. Trace bilateral pretibial edema. Distal pedal pulses are 2+ and equal bilaterally. Neuro: Alert and oriented X 3. No facial asymmetry. No focal deficit. Moves all extremities spontaneously. Psych:  Responds to questions appropriately with a normal affect.   EKG:  The EKG was personally reviewed and demonstrates: Afib, 76 bpm, RBBB, no acute st/t changes Telemetry:  Telemetry was personally reviewed and demonstrates: Afib, 70s to 80s bpm  Weights: Filed Weights   03/01/21 0327  Weight: 117.9 kg    Relevant CV Studies:  2D echo 06/2020: 1. Left ventricular ejection fraction, by estimation, is 40 to 45%. The  left ventricle has mildly decreased  function. The left ventricle  demonstrates global hypokinesis. The left ventricular internal cavity size  was moderately dilated. There is moderate   left ventricular hypertrophy. Left ventricular diastolic parameters are  consistent with Grade I diastolic dysfunction (impaired relaxation).   2. Right ventricular systolic function is moderately reduced. The right  ventricular size is normal. Mildly increased right ventricular wall  thickness. There is normal pulmonary artery systolic pressure.   3. Left atrial size was mildly dilated.   4. Right atrial size was mildly dilated.   5. The mitral valve is grossly normal. Trivial mitral valve  regurgitation.   6. The aortic valve is tricuspid. There is mild thickening of the aortic  valve. Aortic valve regurgitation is not visualized. Mild aortic valve  sclerosis is present, with no evidence of aortic valve stenosis.   7. Aortic dilatation noted. There is mild dilatation of the transverse  aorta, measuring 34 mm.   8. The inferior vena cava is normal in size with greater than 50%  respiratory variability, suggesting right atrial pressure of 3 mmHg. __________  2D echo 03/2018: - Left ventricle: The cavity size was mildly dilated. There was    mild concentric hypertrophy. Systolic function was mildly to    moderately reduced. The estimated ejection fraction was in the    range of 40% to 45%. Probable hypokinesis of the    mid-apicalanteroseptal, anterior, and apical myocardium. Doppler    parameters are consistent with abnormal left ventricular    relaxation (grade 1 diastolic dysfunction).  - Left atrium: The atrium was mildly dilated.  - Right atrium: The atrium was mildly dilated. __________  [facility] 08/2013: Distal left main 60%, mid LAD 80%, D3 30%, OM1 90%, left AV groove 40%, LPL1 90%, LPL2 90%, RCA 100%. EF 35%. Recommendation: CABG.    Laboratory Data:  Chemistry Recent Labs  Lab 03/01/21 0351  NA 138  K 3.1*  CL 103  CO2  26  GLUCOSE 167*  BUN 16  CREATININE 1.11  CALCIUM 9.0  GFRNONAA >60  ANIONGAP 9    No results for input(s): PROT, ALBUMIN, AST, ALT, ALKPHOS, BILITOT in the last 168 hours. Hematology Recent Labs  Lab 03/01/21 0351  WBC 10.5  RBC 3.44*  HGB 10.9*  HCT 31.8*  MCV 92.4  MCH 31.7  MCHC 34.3  RDW 13.5  PLT 225   Cardiac EnzymesNo results for input(s): TROPONINI in the last 168 hours. No results for input(s): TROPIPOC in the last 168 hours.  BNP Recent Labs  Lab 03/01/21 0351  BNP 920.7*    DDimer No results for input(s): DDIMER in the last 168 hours.  Radiology/Studies:  DG Chest Portable 1 View  Result Date: 03/01/2021 IMPRESSION: Chronic cardiomegaly with lower lung volumes with mildly increased interstitial markings since last month. Consider mild or developing interstitial edema. Viral/atypical respiratory infection not excluded. Electronically Signed   By: Odessa Fleming M.D.   On: 03/01/2021 04:13    Assessment and Plan:   1.  CAD involving the native coronary arteries status post CABG with NSTEMI: -Chest pain-free -High-sensitivity troponin has trended to 1214, continue to cycle until peak -ASA -Heparin drip -Obtain echo -He will need LHC prior to discharge, following diuresis  2.  Acute on chronic combined systolic and diastolic CHF/ICM: -He is volume up -Continue diuresis with IV Lasix 40 mg twice daily with KCl repletion -He remains off ACEi/ARB/ARNI/MRA in the setting of significant recurrent hyperkalemia -PTA BiDil and carvedilol -He reports a dry weight of approximately 245 pounds earlier this  fall with his office weight at that time noted to be 249 pounds, with a last clinic weight earlier this month 283.5 pounds -Daily weights -Strict I's and O's  3.  Persistent A. fib: -Recent, briefly successful DCCV on 02/23/2021 with recurrent A. fib thereafter, with timing of recurrent atrial arrhythmia difficult to to determine -He remains in A. fib with controlled  ventricular response -Possibly in the setting of volume overload and hypokalemia -Continue rate control with carvedilol for now with plans to consider repeat DCCV following ischemic evaluation and adequate diuresis if his A. fib persists -CHA2DS2-VASc 4 (CHF, HTN, DM, vascular disease) -Heparin drip in place of Eliquis while admitted until it is clear he will not require invasive procedures with recommendation to resume OAC prior to discharge -Consider screening for sleep apnea if not already done in the outpatient setting  4.  HTN: -Blood pressure mildly elevated in the ED upon presentation with systolics improving -Management as above -Low-sodium diet recommended  5.  HLD: -LDL of 40 from 01/2021 -PTA atorvastatin 80 mg  6.  DM2: -A1c 7.8 in 01/2021 -Per IM  7.  Hypokalemia: -Replete to goal 4.0 -With history of significant hyperkalemia continue to avoid ACEi/ARB/ARNI/MRA   For questions or updates, please contact [facility] Please consult [email or web address] for contact info under Cardiology/STEMI.   Signed, Eula Listenyan Jalen Daluz, PA-C [facility] Pager: [phone] 03/01/2021, 9:55 AM

## 2021-03-01 NOTE — ED Notes (Signed)
Critical lab [Troponin informed to the ED MD, MD stated that she would inform the hospitalist

## 2021-03-01 NOTE — ED Provider Notes (Addendum)
[facility] Emergency Department Provider Note  ____________________________________________   Event Date/Time   First MD Initiated Contact with Patient 03/01/21 [phone]     (approximate)  I have reviewed the triage vital signs and the nursing notes.   HISTORY  Chief Complaint Shortness of Breath    HPI Marc [name] is a 60 y.o. male with history of CHF, CAD, hypertension, diabetes, hyperlipidemia, paroxysmal atrial fibrillation on Eliquis who presents to the emergency department with shortness of breath that started today.  Also reports several days of bilateral lower extremity swelling.  No fevers, cough, chest pain.  Does not wear oxygen at home.  Presents with EMS.  Was a previous smoker.    Echo 06/22/2020:  IMPRESSIONS     1. Left ventricular ejection fraction, by estimation, is 40 to 45%. The  left ventricle has mildly decreased function. The left ventricle  demonstrates global hypokinesis. The left ventricular internal cavity size  was moderately dilated. There is moderate   left ventricular hypertrophy. Left ventricular diastolic parameters are  consistent with Grade I diastolic dysfunction (impaired relaxation).   2. Right ventricular systolic function is moderately reduced. The right  ventricular size is normal. Mildly increased right ventricular wall  thickness. There is normal pulmonary artery systolic pressure.   3. Left atrial size was mildly dilated.   4. Right atrial size was mildly dilated.   5. The mitral valve is grossly normal. Trivial mitral valve  regurgitation.   6. The aortic valve is tricuspid. There is mild thickening of the aortic  valve. Aortic valve regurgitation is not visualized. Mild aortic valve  sclerosis is present, with no evidence of aortic valve stenosis.   7. Aortic dilatation noted. There is mild dilatation of the transverse  aorta, measuring 34 mm.   8. The inferior vena cava is normal in size with  greater than 50%  respiratory variability, suggesting right atrial pressure of 3 mmHg.     Past Medical History:  Diagnosis Date   Anxiety    Chronic systolic heart failure (HCC)    a. 07/2013 Echo: Ejection fraction of 45-50% with possible inferior wall hypokinesis; b. 03/2018 Echo: EF 40-45%; c. 06/2020 Echo: EF 40-45%, glob HK. Mod LVH, gr1 DD. Nl PASP. Mild BAE. Triv MR. Mild AoV sclerosis. Mild Ao dil (107mm).   Coronary artery disease    Significant left main and Severe three-vessel coronary artery disease in December of 2014. He underwent CABG in January of 2015 with LIMA to LAD, sequential SVG to ramus/OM 2 and SVG to OM 3   Diabetes mellitus without complication (HCC)    Hyperlipidemia    Hypertension    Ischemic cardiomyopathy    a. 07/2013 Echo: EF 45-50%; b. 03/2018 Echo: EF 40-45%; c. 06/2020 Echo: EF 40-45%.   Medical non-compliance    MI (myocardial infarction) (HCC)    Obesity    PAD (peripheral artery disease) (HCC)    a. 04/2020 PTA of L PT.   PAF (paroxysmal atrial fibrillation) (HCC)    Tobacco abuse     Patient Active Problem List   Diagnosis Date Noted   Type II diabetes mellitus with renal manifestations (HCC) 01/06/2021   Tobacco abuse 01/06/2021   CKD (chronic kidney disease), stage IIIa 01/06/2021   Hyponatremia 01/06/2021   Elevated troponin 01/06/2021   Nausea & vomiting 01/06/2021   Generalized weakness    Hyperkalemia 11/20/2020   Cellulitis and abscess of toe of left foot 04/05/2020   Diabetes (HCC)  03/14/2018   Blurred vision 03/13/2018   Cellulitis of toe, right 02/29/2016   Atherosclerosis of native arteries of the extremities with ulceration (HCC) 10/21/2014   Acute ischemic colitis (HCC) 09/19/2014   Aortic aneurysm with dissection (HCC) 09/19/2014   Type 2 diabetes mellitus without complication (HCC) 09/19/2014   Encounter for therapeutic drug monitoring 09/26/2013   CAD (coronary artery disease) 09/16/2013   Chronic coronary artery  disease 09/16/2013   Coronary artery disease    PAF (paroxysmal atrial fibrillation) (HCC) 07/29/2013   Atrial fibrillation (HCC) 07/29/2013   Hypertension    Hyperlipidemia    Chronic systolic heart failure (HCC) 07/05/2013    Past Surgical History:  Procedure Laterality Date   AMPUTATION Left 04/08/2020   Procedure: AMPUTATION RAY;  Surgeon: Rosetta Posner, DPM;  Location: [facility] ORS;  Service: Podiatry;  Laterality: Left;   AMPUTATION Left 06/25/2020   Procedure: AMPUTATION RAY 1ST RAY PARTIAL LEFT;  Surgeon: Rosetta Posner, DPM;  Location: [facility] OR[facility];  Service: Podiatry;  Laterality: Left;   AMPUTATION TOE Right 02/29/2016   Procedure: AMPUTATION TOE;  Surgeon: Linus Galas, DPM;  Location: [facility] ORS;  Service: Podiatry;  Laterality: Right;  Great toe   CARDIAC CATHETERIZATION     [facility] no stent   CARDIOVERSION N/A 02/23/2021   Procedure: CARDIOVERSION;  Surgeon: Debbe Odea, MD;  Location: [facility] ORS;  Service: Cardiovascular;  Laterality: N/A;   CORONARY ARTERY BYPASS GRAFT N/A 09/16/2013   Procedure: CORONARY ARTERY BYPASS GRAFTING (CABG);  Surgeon: [name], MD;  Location: [facility];  Service: Open Heart Surgery;  Laterality: N/A;  CABG x four, using left internal mammary artery and right leg greater saphenous vein   INTRAOPERATIVE TRANSESOPHAGEAL ECHOCARDIOGRAM N/A 09/16/2013   Procedure: INTRAOPERATIVE TRANSESOPHAGEAL ECHOCARDIOGRAM;  Surgeon: [name], MD;  Location: [facility] OR;  Service: Open Heart Surgery;  Laterality: N/A;   knee surgery Right    LASIK Bilateral    LOWER EXTREMITY ANGIOGRAPHY Left 04/07/2020   Procedure: Lower Extremity Angiography;  Surgeon: Renford Dills, MD;  Location: [facility] INVASIVE CV LAB;  Service: Cardiovascular;  Laterality: Left;   LOWER EXTREMITY ANGIOGRAPHY Left 04/09/2020   Procedure: Lower Extremity Angiography (Pedal Approach);  Surgeon: Renford Dills, MD;  Location: [facility] INVASIVE CV LAB;  Service: Cardiovascular;  Laterality: Left;     Prior to Admission medications   Medication Sig Start Date End Date Taking? Authorizing Provider  acetaminophen (TYLENOL) 500 MG tablet Take 1,000 mg by mouth every 6 (six) hours as needed for moderate pain or headache.    [provider]  apixaban (ELIQUIS) 5 MG TABS tablet Take 1 tablet (5 mg total) by mouth 2 (two) times daily. 01/21/21   Creig Hines, NP  atorvastatin (LIPITOR) 80 MG tablet Take 80 mg by mouth every evening.  01/31/18   [provider]  calcium carbonate (TUMS - DOSED IN MG ELEMENTAL CALCIUM) 500 MG chewable tablet Chew 500 mg by mouth daily as needed for indigestion or heartburn.    [provider]  carvedilol (COREG) 25 MG tablet Take 25 mg by mouth 2 (two) times daily.    [provider]  cloNIDine (CATAPRES) 0.3 MG tablet Take 0.3 mg by mouth 2 (two) times daily.  01/24/18   [provider]  glipiZIDE (GLUCOTROL XL) 10 MG 24 hr tablet Take 10 mg by mouth daily.  01/31/18   [provider]  hydrOXYzine (ATARAX/VISTARIL) 50 MG tablet Take 50 mg by mouth 4 (four) times daily as  needed for anxiety. 01/29/21   [provider]  insulin glargine (LANTUS SOLOSTAR) 100 UNIT/ML Solostar Pen Inject 42 Units into the skin daily. 01/07/21   Rai, Ripudeep K, MD  isosorbide-hydrALAZINE (BIDIL) 20-37.5 MG tablet Take 1 tablet by mouth 3 (three) times daily. 02/10/21   Debbe Odea, MD  Naphazoline-Pheniramine (VISINE-A OP) Place 1 drop into both eyes daily as needed (allergies).    [provider]  potassium chloride (KLOR-CON) 10 MEQ tablet Take 10 mEq by mouth daily. 02/08/21   [provider]  torsemide (DEMADEX) 20 MG tablet Take 2 tablets (40 mg total) by mouth daily. 02/10/21 05/11/21  Debbe Odea, MD    Allergies Patient has no known allergies.  Family History  Problem Relation Age of Onset   Heart disease Mother     Social History Social History   Tobacco Use   Smoking  status: Former    Packs/day: 0.50    Years: 45.00    Pack years: 22.50    Types: Cigarettes    Quit date: 02/09/2021    Years since quitting: 0.0   Smokeless tobacco: Never   Tobacco comments:    trying to quit  Vaping Use   Vaping Use: Never used  Substance Use Topics   Alcohol use: No    Alcohol/week: 0.0 standard drinks   Drug use: No    Review of Systems Constitutional: No fever. Eyes: No visual changes. ENT: No sore throat. Cardiovascular: Denies chest pain. Respiratory: +  shortness of breath. Gastrointestinal: No nausea, vomiting, diarrhea. Genitourinary: Negative for dysuria. Musculoskeletal: Negative for back pain. Skin: Negative for rash. Neurological: Negative for focal weakness or numbness.  ____________________________________________   PHYSICAL EXAM:  VITAL SIGNS: ED Triage Vitals  Enc Vitals Group     BP 03/01/21 0326 (!) 159/81     Pulse Rate 03/01/21 0326 94     Resp 03/01/21 0326 (!) 26     Temp 03/01/21 0326 97.9 F (36.6 C)     Temp Source 03/01/21 0326 Oral     SpO2 03/01/21 0326 100 %     Weight 03/01/21 0327 260 lb (117.9 kg)     Height 03/01/21 0327 6\' 1"  (1.854 m)     Head Circumference --      Peak Flow --      Pain Score 03/01/21 0327 0     Pain Loc --      Pain Edu? --      Excl. in GC? --    CONSTITUTIONAL: Alert and oriented and responds appropriately to questions.  Chronically ill-appearing HEAD: Normocephalic EYES: Conjunctivae clear, pupils appear equal, EOM appear intact ENT: normal nose; moist mucous membranes NECK: Supple, normal ROM CARD: Regular and tachycardic with multiple PVCs noted on the cardiac monitor; S1 and S2 appreciated; no murmurs, no clicks, no rubs, no gallops RESP: Patient is tachypneic and speaking short sentences.  Sats dropped to 86% on room air at rest.  Doing well on 4 L nasal cannula.  Bibasilar rales on exam.  No rhonchi, wheezing. ABD/GI: Normal bowel sounds; non-distended; soft, non-tender, no  rebound, no guarding, no peritoneal signs, no hepatosplenomegaly BACK: The back appears normal EXT: Normal ROM in all joints; no deformity noted, + 1 pitting edema in bilateral lower extremities below the knee, no calf tenderness or calf swelling SKIN: Normal color for age and race; warm; no rash on exposed skin NEURO: Moves all extremities equally PSYCH: The patient's mood and manner are appropriate.  ____________________________________________  LABS (all labs ordered are listed, but only abnormal results are displayed)  Labs Reviewed  CBC WITH DIFFERENTIAL/PLATELET - Abnormal; Notable for the following components:      Result Value   RBC 3.44 (*)    Hemoglobin 10.9 (*)    HCT 31.8 (*)    Neutro Abs 8.7 (*)    All other components within normal limits  BASIC METABOLIC PANEL - Abnormal; Notable for the following components:   Potassium 3.1 (*)    Glucose, Bld 167 (*)    All other components within normal limits  BRAIN NATRIURETIC PEPTIDE - Abnormal; Notable for the following components:   B Natriuretic Peptide 920.7 (*)    All other components within normal limits  TROPONIN I (HIGH SENSITIVITY) - Abnormal; Notable for the following components:   Troponin I (High Sensitivity) 36 (*)    All other components within normal limits  RESP PANEL BY RT-PCR (FLU A&B, COVID) ARPGX2  BLOOD GAS, ARTERIAL  MAGNESIUM  TROPONIN I (HIGH SENSITIVITY)   ____________________________________________  EKG   Date: 03/01/2021 3:24 AM  Rate: 107  Rhythm: Sinus tachycardia  QRS Axis: normal  Intervals: Right bundle branch block  ST/T Wave abnormalities: normal  Conduction Disutrbances: Right bundle branch block  Narrative Interpretation: Sinus tachycardia, right bundle branch block, PVCs    Date: 03/01/2021 6:51 AM  Rate: 85  Rhythm: Atrial flutter  QRS Axis: normal  Intervals: Right bundle branch block  ST/T Wave abnormalities: normal  Conduction Disutrbances: Right bundle branch  block  Narrative Interpretation: Right bundle branch block, atrial flutter, no new ischemic change compared to previous    ____________________________________________  RADIOLOGY I, Isolde Skaff, personally viewed and evaluated these images (plain radiographs) as part of my medical decision making, as well as reviewing the written report by the radiologist.  ED MD interpretation: CHF exacerbation.  Official radiology report(s): DG Chest Portable 1 View  Result Date: 03/01/2021 CLINICAL DATA:  60 year old male with shortness of breath since 0100 hours. EXAM: PORTABLE CHEST 1 VIEW COMPARISON:  Portable chest 01/06/2021 and earlier. FINDINGS: Portable AP upright view at 0349 hours. Mildly lower lung volumes. Stable cardiomegaly and mediastinal contours. Prior sternotomy. Visualized tracheal air column is within normal limits. Mildly increased interstitial markings in both lungs since last month. No pneumothorax, pleural effusion or consolidation. Chronic un-healed left clavicle fracture. Stable visualized osseous structures. IMPRESSION: Chronic cardiomegaly with lower lung volumes with mildly increased interstitial markings since last month. Consider mild or developing interstitial edema. Viral/atypical respiratory infection not excluded. Electronically Signed   By: Odessa FlemingH  Hall M.D.   On: 03/01/2021 04:13    ____________________________________________   PROCEDURES  Procedure(s) performed (including Critical Care):  Procedures  CRITICAL CARE Performed by: Baxter HireKristen Danisa Kopec   Total critical care time: 65 minutes  Critical care time was exclusive of separately billable procedures and treating other patients.  Critical care was necessary to treat or prevent imminent or life-threatening deterioration.  Critical care was time spent personally by me on the following activities: development of treatment plan with patient and/or surrogate as well as nursing, discussions with consultants, evaluation  of patient's response to treatment, examination of patient, obtaining history from patient or surrogate, ordering and performing treatments and interventions, ordering and review of laboratory studies, ordering and review of radiographic studies, pulse oximetry and re-evaluation of patient's condition.  ____________________________________________   INITIAL IMPRESSION / ASSESSMENT AND PLAN / ED COURSE  As part of my medical decision making, I reviewed the following data within the  electronic MEDICAL RECORD NUMBER Nursing notes reviewed and incorporated, Labs reviewed , EKG interpreted , Old EKG reviewed, Old chart reviewed, Radiograph reviewed , Discussed with admitting physician , and Notes from prior ED visits         Patient here with likely CHF exacerbation.  Rales on exam, no peripheral edema.  Hypoxic here.  Will obtain labs, chest x-ray.  EKG shows no new ischemic change.  Anticipate admission for diuresis.  Differential also includes ACS, PE, pneumonia, pneumothorax.  ED PROGRESS  Patient's ABG is reassuring.  BNP elevated at 920.  First troponin is 36.  Chest x-ray shows pulmonary edema.  Will give IV Lasix and admit for diuresis given new oxygen requirement.  5:18 AM Discussed patient's case with hospitalist, Dr. Arville Care.  I have recommended admission and patient (and family if present) agree with this plan. Admitting physician will place admission orders.   I reviewed all nursing notes, vitals, pertinent previous records and reviewed/interpreted all EKGs, lab and urine results, imaging (as available).   6:48 AM Pt's repeat troponin is 190.  Hospitalist updated.  Will order aspirin and heparin.  Patient is not having any chest pain only shortness of breath.  Repeat EKG shows that patient is in atrial flutter but rate controlled.  No new ischemic change.  We will continue to closely monitor. ____________________________________________   FINAL CLINICAL IMPRESSION(S) / ED  DIAGNOSES  Final diagnoses:  Acute on chronic congestive heart failure, unspecified heart failure type (HCC)  Acute respiratory failure with hypoxia [facility])     ED Discharge Orders     None       *Please note:  Marc [name] was evaluated in Emergency Department on 03/01/2021 for the symptoms described in the history of present illness. He was evaluated in the context of the global COVID-19 pandemic, which necessitated consideration that the patient might be at risk for infection with the SARS-CoV-2 virus that causes COVID-19. Institutional protocols and algorithms that pertain to the evaluation of patients at risk for COVID-19 are in a state of rapid change based on information released by regulatory bodies including the CDC and federal and state organizations. These policies and algorithms were followed during the patient's care in the ED.  Some ED evaluations and interventions may be delayed as a result of limited staffing during and the pandemic.*   Note:  This document was prepared using Dragon voice recognition software and may include unintentional dictation errors.        Danalee Flath, Layla Maw, DO 03/01/21 [phone]

## 2021-03-01 NOTE — H&P (Signed)
History and Physical    Marc [name] QVZ:[id number] DOB: 07/05/1961 DOA: 03/01/2021  [facility]   Patient coming from: Home  I have personally briefly reviewed patient's [facility]  Chief Complaint: Shortness of breath  HPI: Marc [name] is a 60 y.o. male with medical history significant for atrial fibrillation status post recent DC cardioversion, history of chronic systolic CHF last known LVEF of 40 to 45%, history of coronary artery disease status post CABG x4, hypertension, diabetes mellitus and dyslipidemia who presents to the emergency room for evaluation of shortness of breath and increased weight gain. Patient states that he was in his usual state of health until the evening prior to his admission when he developed shortness of breath at rest associated with bilateral lower extremity swelling and two-pillow orthopnea. He denies having any chest pain, no nausea, no vomiting, no palpitations, no diaphoresis, no headache, no blurred vision, no fever, no chills, no cough, no urinary symptoms, no dizziness or lightheadedness. ABG 7.42/41/84/26.6/96.5 Sodium 138, potassium 3.1, chloride 103, bicarb 26, glucose 167, BUN 16, creatinine 1.1, calcium 9.0, magnesium 1.8, BNP 920, troponin 36 >> 190, white count 12.5, hemoglobin 10.9, hematocrit 31.8, MCV 92.4, RDW 13.5, platelet count 225 Respiratory viral panel is negative Chest x-ray reviewed by me shows chronic cardiomegaly with increased interstitial markings. Twelve-lead EKG reviewed by me shows atrial fibrillation with a right bundle branch block.   ED Course: Patient is a 60 year old Caucasian male with a history of chronic systolic heart failure, status post recent DC cardioversion for atrial fibrillation who presents to the ER for evaluation of shortness of breath, bilateral lower extremity swelling and increased weight gain over the last couple of days. His BNP is elevated and  chest x-ray suggested pulmonary edema. He received a dose of Lasix in the ER and was started on a heparin drip for suspected non-STEMI. He will be admitted to the hospital for further evaluation.   Review of Systems: As per HPI otherwise all other systems reviewed and negative.    Past Medical History:  Diagnosis Date   Anxiety    Chronic systolic heart failure (HCC)    a. 07/2013 Echo: Ejection fraction of 45-50% with possible inferior wall hypokinesis; b. 03/2018 Echo: EF 40-45%; c. 06/2020 Echo: EF 40-45%, glob HK. Mod LVH, gr1 DD. Nl PASP. Mild BAE. Triv MR. Mild AoV sclerosis. Mild Ao dil (34mm).   Coronary artery disease    Significant left main and Severe three-vessel coronary artery disease in December of 2014. He underwent CABG in January of 2015 with LIMA to LAD, sequential SVG to ramus/OM 2 and SVG to OM 3   Diabetes mellitus without complication (HCC)    Hyperlipidemia    Hypertension    Ischemic cardiomyopathy    a. 07/2013 Echo: EF 45-50%; b. 03/2018 Echo: EF 40-45%; c. 06/2020 Echo: EF 40-45%.   Medical non-compliance    MI (myocardial infarction) (HCC)    Obesity    PAD (peripheral artery disease) (HCC)    a. 04/2020 PTA of L PT.   PAF (paroxysmal atrial fibrillation) (HCC)    Tobacco abuse     Past Surgical History:  Procedure Laterality Date   AMPUTATION Left 04/08/2020   Procedure: AMPUTATION RAY;  Surgeon: Rosetta PosnerBaker, Andrew, DPM;  Location: [facility] ORS;  Service: Podiatry;  Laterality: Left;   AMPUTATION Left 06/25/2020   Procedure: AMPUTATION RAY 1ST RAY PARTIAL LEFT;  Surgeon: Rosetta PosnerBaker, Andrew, DPM;  [facility]  ORS;  Service: Podiatry;  Laterality: Left;   AMPUTATION TOE Right 02/29/2016   Procedure: AMPUTATION TOE;  Surgeon: Linus Galas, DPM;  Location: [facility] ORS;  Service: Podiatry;  Laterality: Right;  Great toe   CARDIAC CATHETERIZATION     [facility] no stent   CARDIOVERSION N/A 02/23/2021   Procedure: CARDIOVERSION;  Surgeon: Debbe Odea, MD;  Location:  [facility] ORS;  Service: Cardiovascular;  Laterality: N/A;   CORONARY ARTERY BYPASS GRAFT N/A 09/16/2013   Procedure: CORONARY ARTERY BYPASS GRAFTING (CABG);  Surgeon: [name], MD;  Location: [facility] OR;  Service: Open Heart Surgery;  Laterality: N/A;  CABG x four, using left internal mammary artery and right leg greater saphenous vein   INTRAOPERATIVE TRANSESOPHAGEAL ECHOCARDIOGRAM N/A 09/16/2013   Procedure: INTRAOPERATIVE TRANSESOPHAGEAL ECHOCARDIOGRAM;  Surgeon: [name], MD;  Location: [facility] OR;  Service: Open Heart Surgery;  Laterality: N/A;   knee surgery Right    LASIK Bilateral    LOWER EXTREMITY ANGIOGRAPHY Left 04/07/2020   Procedure: Lower Extremity Angiography;  Surgeon: Renford Dills, MD;  Location: [facility] INVASIVE CV LAB;  Service: Cardiovascular;  Laterality: Left;   LOWER EXTREMITY ANGIOGRAPHY Left 04/09/2020   Procedure: Lower Extremity Angiography (Pedal Approach);  Surgeon: Renford Dills, MD;  Location: [facility] INVASIVE CV LAB;  Service: Cardiovascular;  Laterality: Left;     reports that he quit smoking about 2 weeks ago. His smoking use included cigarettes. He has a 22.50 pack-year smoking history. He has never used smokeless tobacco. He reports that he does not drink alcohol and does not use drugs.  No Known Allergies  Family History  Problem Relation Age of Onset   Heart disease Mother       Prior to Admission medications   Medication Sig Start Date End Date Taking? Authorizing Provider  acetaminophen (TYLENOL) 500 MG tablet Take 1,000 mg by mouth every 6 (six) hours as needed for moderate pain or headache.   Yes [provider]  apixaban (ELIQUIS) 5 MG TABS tablet Take 1 tablet (5 mg total) by mouth 2 (two) times daily. 01/21/21  Yes Creig Hines, NP  atorvastatin (LIPITOR) 80 MG tablet Take 80 mg by mouth every evening.  01/31/18  Yes [provider]  calcium carbonate (TUMS - DOSED IN MG ELEMENTAL CALCIUM) 500 MG chewable tablet Chew  500 mg by mouth daily as needed for indigestion or heartburn.   Yes [provider]  carvedilol (COREG) 25 MG tablet Take 25 mg by mouth 2 (two) times daily.   Yes [provider]  cloNIDine (CATAPRES) 0.3 MG tablet Take 0.3 mg by mouth 2 (two) times daily.  01/24/18  Yes [provider]  glipiZIDE (GLUCOTROL XL) 10 MG 24 hr tablet Take 10 mg by mouth daily.  01/31/18  Yes [provider]  hydrOXYzine (ATARAX/VISTARIL) 50 MG tablet Take 50 mg by mouth 4 (four) times daily as needed for anxiety. 01/29/21  Yes [provider]  insulin glargine (LANTUS SOLOSTAR) 100 UNIT/ML Solostar Pen Inject 42 Units into the skin daily. 01/07/21  Yes Rai, Ripudeep K, MD  isosorbide-hydrALAZINE (BIDIL) 20-37.5 MG tablet Take 1 tablet by mouth 3 (three) times daily. 02/10/21  Yes Agbor-Etang, Arlys John, MD  Naphazoline-Pheniramine (VISINE-A OP) Place 1 drop into both eyes daily as needed (allergies).   Yes [provider]  potassium chloride (KLOR-CON) 10 MEQ tablet Take 10 mEq by mouth daily. 02/08/21  Yes [provider]  torsemide (DEMADEX) 20 MG tablet Take 2 tablets (  40 mg total) by mouth daily. 02/10/21 05/11/21 Yes Debbe Odea, MD    Physical Exam: Vitals:   03/01/21 0413 03/01/21 0554 03/01/21 0657 03/01/21 0815  BP: (!) 144/61 99/61 114/61 137/90  Pulse: 89 77 86 82  Resp: (!) 21 20 20 20   Temp:      TempSrc:      SpO2: 96% 96% 98% 95%  Weight:      Height:         Vitals:   03/01/21 0413 03/01/21 0554 03/01/21 0657 03/01/21 0815  BP: (!) 144/61 99/61 114/61 137/90  Pulse: 89 77 86 82  Resp: (!) 21 20 20 20   Temp:      TempSrc:      SpO2: 96% 96% 98% 95%  Weight:      Height:          Constitutional: Alert and oriented x 3 . Not in any apparent distress HEENT:      Head: Normocephalic and atraumatic.         Eyes: PERLA, EOMI, Conjunctivae are normal. Sclera is non-icteric.       Mouth/Throat: Mucous membranes are dry      Neck:  Supple with no signs of meningismus. Cardiovascular: Irregularly irregular. No murmurs, gallops, or rubs. 2+ symmetrical distal pulses are present . No JVD. 2+ LE edema Respiratory: Respiratory effort normal .crackles in both Lungs bilaterally. No wheezes or rhonchi  Gastrointestinal: Soft, non tender, and non distended with positive bowel sounds.  Central adiposity Genitourinary: No CVA tenderness. Musculoskeletal: Nontender with normal range of motion in all extremities. No cyanosis, or erythema of extremities. Neurologic:  Face is symmetric. Moving all extremities. No gross focal neurologic deficits . Skin: Skin is warm, dry.  No rash or ulcers Psychiatric: Depressed mood and flat affect    Labs on Admission: I have personally reviewed following labs and imaging studies  CBC: Recent Labs  Lab 03/01/21 0351  WBC 10.5  NEUTROABS 8.7*  HGB 10.9*  HCT 31.8*  MCV 92.4  PLT 225   Basic Metabolic Panel: Recent Labs  Lab 03/01/21 0351  NA 138  K 3.1*  CL 103  CO2 26  GLUCOSE 167*  BUN 16  CREATININE 1.11  CALCIUM 9.0  MG 1.8   GFR: Estimated Creatinine Clearance: 94 mL/min (by C-G formula based on SCr of 1.11 mg/dL). Liver Function Tests: No results for input(s): AST, ALT, ALKPHOS, BILITOT, PROT, ALBUMIN in the last 168 hours. No results for input(s): LIPASE, AMYLASE in the last 168 hours. No results for input(s): AMMONIA in the last 168 hours. Coagulation Profile: No results for input(s): INR, PROTIME in the last 168 hours. Cardiac Enzymes: No results for input(s): CKTOTAL, CKMB, CKMBINDEX, TROPONINI in the last 168 hours. BNP (last 3 results) No results for input(s): PROBNP in the last 8760 hours. HbA1C: No results for input(s): HGBA1C in the last 72 hours. CBG: Recent Labs  Lab 02/23/21 0729 02/23/21 0828  GLUCAP 72 84   Lipid Profile: No results for input(s): CHOL, HDL, LDLCALC, TRIG, CHOLHDL, LDLDIRECT in the last 72 hours. Thyroid Function Tests: No  results for input(s): TSH, T4TOTAL, FREET4, T3FREE, THYROIDAB in the last 72 hours. Anemia Panel: No results for input(s): VITAMINB12, FOLATE, FERRITIN, TIBC, IRON, RETICCTPCT in the last 72 hours. Urine analysis:    Component Value Date/Time   COLORURINE YELLOW (A) 01/06/2021 1200   APPEARANCEUR CLEAR (A) 01/06/2021 1200   APPEARANCEUR Clear 09/18/2014 2018   LABSPEC 1.013 01/06/2021 1200  LABSPEC 1.005 09/18/2014 2018   PHURINE 7.0 01/06/2021 1200   GLUCOSEU 50 (A) 01/06/2021 1200   GLUCOSEU 150 mg/dL 78/93/8101 7510   HGBUR NEGATIVE 01/06/2021 1200   BILIRUBINUR NEGATIVE 01/06/2021 1200   BILIRUBINUR Negative 09/18/2014 2018   KETONESUR NEGATIVE 01/06/2021 1200   PROTEINUR NEGATIVE 01/06/2021 1200   UROBILINOGEN 0.2 09/12/2013 1555   NITRITE NEGATIVE 01/06/2021 1200   LEUKOCYTESUR NEGATIVE 01/06/2021 1200   LEUKOCYTESUR Negative 09/18/2014 2018    Radiological Exams on Admission: DG Chest Portable 1 View  Result Date: 03/01/2021 CLINICAL DATA:  60 year old male with shortness of breath since 0100 hours. EXAM: PORTABLE CHEST 1 VIEW COMPARISON:  Portable chest 01/06/2021 and earlier. FINDINGS: Portable AP upright view at 0349 hours. Mildly lower lung volumes. Stable cardiomegaly and mediastinal contours. Prior sternotomy. Visualized tracheal air column is within normal limits. Mildly increased interstitial markings in both lungs since last month. No pneumothorax, pleural effusion or consolidation. Chronic un-healed left clavicle fracture. Stable visualized osseous structures. IMPRESSION: Chronic cardiomegaly with lower lung volumes with mildly increased interstitial markings since last month. Consider mild or developing interstitial edema. Viral/atypical respiratory infection not excluded. Electronically Signed   By: Odessa Fleming M.D.   On: 03/01/2021 04:13     Assessment/Plan Principal Problem:   Acute on chronic systolic CHF (congestive heart failure) (HCC) Active Problems:    Hypertension   CAD (coronary artery disease)   Type 2 diabetes mellitus without complication (HCC)   Atrial fibrillation (HCC)   Anxiety   Hypokalemia   NSTEMI (non-ST elevated myocardial infarction) (HCC)    Acute on chronic systolic heart failure Patient has a history of chronic systolic heart failure with last known LVEF of 40 to 45% He presents for evaluation of shortness of breath, bilateral lower extremity swelling, weight gain. Chest x-ray suggestive of interstitial edema and BNP is elevated Will place patient on Lasix 40 mg IV every 12 Continue carvedilol, hydralazine and nitrates Patient is not on an ACE inhibitor or ARB due to complications of hyperkalemia.     Acute non-ST elevation MI Patient has a known history of coronary artery disease and presents for evaluation of sudden onset shortness of breath associated with bilateral lower extremity swelling and weight gain. He has a bump in his troponin from 36 >> 1214 Continue heparin drip initiated in the ER Continue aspirin, high intensity statin and carvedilol. Will request cardiology consult Obtain 2D echocardiogram to rule out regional wall motion abnormality and assess LVEF.     History of atrial fibrillation Status post recent DC cardioversion Hold Eliquis since patient is on heparin Continue carvedilol for rate control     Diabetes mellitus Hold oral hypoglycemic agents Maintain consistent carbohydrate diet Check blood sugars before meals and at bedtime      Hypokalemia Secondary to diuretic use Recommend potassium    Anxiety disorder Continue Vistaril     DVT prophylaxis: Heparin Code Status: full code  Family Communication: Greater than 50% of time was spent discussing patient's condition and plan of care with him at the bedside.  All questions and concerns have been addressed.  He verbalizes understanding and agrees with the plan. Disposition Plan: Back to previous home  environment Consults called: Cardiology Status: At the time of admission, it appears that the appropriate admission status for this patient is inpatient. This is judged to be reasonable and necessary in order to provide the required intensity of service to ensure the patient's safety given the presenting symptoms, physical exam findings, and  initial radiographic and laboratory data in the context of their comorbid conditions. Patient requires inpatient status due to high intensity of service, high risk for further deterioration and high frequency of surveillance required.    Lucile Shutters MD [facility]     03/01/2021, 8:50 AM

## 2021-03-01 NOTE — Consult Note (Signed)
ANTICOAGULATION CONSULT NOTE   Pharmacy Consult for heparin Indication: chest pain/ACS  No Known Allergies  Patient Measurements: Height: 6\' 1"  (185.4 cm) Weight: 117.9 kg (260 lb) IBW/kg (Calculated) : 79.9 Heparin Dosing Weight: 105.3 kg  Vital Signs: Temp: 97.9 F (36.6 C) (06/28 0326) Temp Source: Oral (06/28 0326) BP: 142/96 (06/28 1130) Pulse Rate: 73 (06/28 1130)  Labs: Recent Labs    03/01/21 0351 03/01/21 0546 03/01/21 0809 03/01/21 1239 03/01/21 1334  HGB 10.9*  --   --   --   --   HCT 31.8*  --   --   --   --   PLT 225  --   --   --   --   APTT  --   --  36  --  74*  HEPARINUNFRC  --   --  0.77*  --   --   CREATININE 1.11  --   --   --   --   TROPONINIHS 36* 190* 1,214* 3,688*  --      Estimated Creatinine Clearance: 94 mL/min (by C-G formula based on SCr of 1.11 mg/dL).   Medical History: Past Medical History:  Diagnosis Date   Anxiety    Chronic systolic heart failure (HCC)    a. 07/2013 Echo: Ejection fraction of 45-50% with possible inferior wall hypokinesis; b. 03/2018 Echo: EF 40-45%; c. 06/2020 Echo: EF 40-45%, glob HK. Mod LVH, gr1 DD. Nl PASP. Mild BAE. Triv MR. Mild AoV sclerosis. Mild Ao dil (19mm).   Coronary artery disease    Significant left main and Severe three-vessel coronary artery disease in December of 2014. He underwent CABG in January of 2015 with LIMA to LAD, sequential SVG to ramus/OM 2 and SVG to OM 3   Diabetes mellitus without complication (HCC)    Hyperlipidemia    Hypertension    Ischemic cardiomyopathy    a. 07/2013 Echo: EF 45-50%; b. 03/2018 Echo: EF 40-45%; c. 06/2020 Echo: EF 40-45%.   Medical non-compliance    MI (myocardial infarction) (HCC)    Obesity    PAD (peripheral artery disease) (HCC)    a. 04/2020 PTA of L PT.   PAF (paroxysmal atrial fibrillation) (HCC)    Tobacco abuse     Medications:  Eliquis 5 mg BID   Assessment: 60 y.o. male with history of CHF, CAD, HTN, diabetes, HLD, paroxysmal atrial  fibrillation on Eliquis who presents to the emergency department with shortness of breath and bilateral lower extremity swelling. Patient was found to have troponin elevated to 190. Pharmacy has been consulted for heparin dosing for ACS.  Patients last dose of Eliquis was 6/28 @ 1730 Heparin Dosing Weight: 105.3 kg Hgb: 10.9; plts 225  6/28 1334 aPTT 74   Goal of Therapy:  Heparin level 0.3-0.7 units/ml once aPTT and HL correlates.  aPTT 66-102 seconds Monitor platelets by anticoagulation protocol: Yes   Plan:  aPTT level is therapeutic. Will continue heparin infusion at 1400 units/hr. Recheck aPTT in 6 hours. Heparin level and CBC with AM labs. Change to Heparin level once aPTT and heparin level correlate.   7/28, PharmD 03/01/2021,2:04 PM

## 2021-03-01 NOTE — ED Notes (Signed)
Echo in with pt now.   

## 2021-03-01 NOTE — Consult Note (Signed)
ANTICOAGULATION CONSULT NOTE   Pharmacy Consult for heparin Indication: chest pain/ACS  Patient Measurements: Heparin Dosing Weight: 105.3 kg  Labs: Recent Labs    03/01/21 0351 03/01/21 0546 03/01/21 0809 03/01/21 1239 03/01/21 1334 03/01/21 1642 03/01/21 1955  HGB 10.9*  --   --   --   --   --   --   HCT 31.8*  --   --   --   --   --   --   PLT 225  --   --   --   --   --   --   APTT  --   --  36  --  74*  --  59*  HEPARINUNFRC  --   --  0.77*  --   --   --   --   CREATININE 1.11  --   --   --   --   --   --   TROPONINIHS 36*   < > 1,214* 3,688*  --  3,665*  --    < > = values in this interval not displayed.     Estimated Creatinine Clearance: 96 mL/min (by C-G formula based on SCr of 1.11 mg/dL).   Medical History: Past Medical History:  Diagnosis Date   Anxiety    Chronic systolic heart failure (HCC)    a. 07/2013 Echo: Ejection fraction of 45-50% with possible inferior wall hypokinesis; b. 03/2018 Echo: EF 40-45%; c. 06/2020 Echo: EF 40-45%, glob HK. Mod LVH, gr1 DD. Nl PASP. Mild BAE. Triv MR. Mild AoV sclerosis. Mild Ao dil (52mm).   Coronary artery disease    Significant left main and Severe three-vessel coronary artery disease in December of 2014. He underwent CABG in January of 2015 with LIMA to LAD, sequential SVG to ramus/OM 2 and SVG to OM 3   Diabetes mellitus without complication (HCC)    Hyperlipidemia    Hypertension    Ischemic cardiomyopathy    a. 07/2013 Echo: EF 45-50%; b. 03/2018 Echo: EF 40-45%; c. 06/2020 Echo: EF 40-45%.   Medical non-compliance    MI (myocardial infarction) (HCC)    Obesity    PAD (peripheral artery disease) (HCC)    a. 04/2020 PTA of L PT.   PAF (paroxysmal atrial fibrillation) (HCC)    Tobacco abuse     Medications:  Eliquis 5 mg BID   Assessment: 60 y.o. male with history of CHF, CAD, HTN, diabetes, HLD, paroxysmal atrial fibrillation on Eliquis who presents to the emergency department with shortness of breath and  bilateral lower extremity swelling. Patient was found to have troponin elevated to 190. Pharmacy has been consulted for heparin dosing for ACS.   Hgb: 10.9; plts 225. Baseline HL 0.77, aPTT 36s  6/28 1334 aPTT 74 6/28 1955 aPTT 59  Goal of Therapy:  Heparin level 0.3-0.7 units/ml once aPTT and HL correlates.  aPTT 66-102 seconds Monitor platelets by anticoagulation protocol: Yes   Plan:  --aPTT is subtherapeutic --Heparin 1500 unit IV bolus and increase heparin infusion rate to 1600 units/hr --Re-check aPTT 6 hours after rate change --Daily CBC per protocol while on IV heparin  Marc [name] 03/01/2021,9:22 PM

## 2021-03-01 NOTE — Consult Note (Signed)
ANTICOAGULATION CONSULT NOTE   Pharmacy Consult for heparin Indication: chest pain/ACS  No Known Allergies  Patient Measurements: Height: 6\' 1"  (185.4 cm) Weight: 117.9 kg (260 lb) IBW/kg (Calculated) : 79.9 Heparin Dosing Weight: 105.3 kg  Vital Signs: Temp: 97.9 F (36.6 C) (06/28 0326) Temp Source: Oral (06/28 0326) BP: 114/61 (06/28 0657) Pulse Rate: 86 (06/28 0657)  Labs: Recent Labs    03/01/21 0351 03/01/21 0546  HGB 10.9*  --   HCT 31.8*  --   PLT 225  --   CREATININE 1.11  --   TROPONINIHS 36* 190*    Estimated Creatinine Clearance: 94 mL/min (by C-G formula based on SCr of 1.11 mg/dL).   Medical History: Past Medical History:  Diagnosis Date   Anxiety    Chronic systolic heart failure (HCC)    a. 07/2013 Echo: Ejection fraction of 45-50% with possible inferior wall hypokinesis; b. 03/2018 Echo: EF 40-45%; c. 06/2020 Echo: EF 40-45%, glob HK. Mod LVH, gr1 DD. Nl PASP. Mild BAE. Triv MR. Mild AoV sclerosis. Mild Ao dil (38mm).   Coronary artery disease    Significant left main and Severe three-vessel coronary artery disease in December of 2014. He underwent CABG in January of 2015 with LIMA to LAD, sequential SVG to ramus/OM 2 and SVG to OM 3   Diabetes mellitus without complication (HCC)    Hyperlipidemia    Hypertension    Ischemic cardiomyopathy    a. 07/2013 Echo: EF 45-50%; b. 03/2018 Echo: EF 40-45%; c. 06/2020 Echo: EF 40-45%.   Medical non-compliance    MI (myocardial infarction) (HCC)    Obesity    PAD (peripheral artery disease) (HCC)    a. 04/2020 PTA of L PT.   PAF (paroxysmal atrial fibrillation) (HCC)    Tobacco abuse     Medications:  Eliquis 5 mg BID   Assessment: 60 y.o. male with history of CHF, CAD, HTN, diabetes, HLD, paroxysmal atrial fibrillation on Eliquis who presents to the emergency department with shortness of breath and bilateral lower extremity swelling. Patient was found to have troponin elevated to 190. Pharmacy has been  consulted for heparin dosing for ACS.  Patients last dose of Eliquis was 6/28 @ 1730 Heparin Dosing Weight: 105.3 kg Hgb: 10.9; plts 225  Goal of Therapy:  Heparin level 0.3-0.7 units/ml aPTT 66-102 seconds Monitor platelets by anticoagulation protocol: Yes   Plan:  Give 4000 units bolus x 1 Start heparin infusion at 1400 units/hr Check aPTT level in 6 hours and daily while on heparin Will monitor aPTT and heparin levels until they correlate  Continue to monitor H&H and platelets  7/28, PharmD 03/01/2021,7:25 AM

## 2021-03-01 NOTE — ED Notes (Signed)
Diuresis from lasix an additional 350 for a total of 750 ml so far

## 2021-03-01 NOTE — Progress Notes (Signed)
*  PRELIMINARY RESULTS* Echocardiogram 2D Echocardiogram has been performed.  Marc [name] 03/01/2021, 2:01 PM

## 2021-03-02 DIAGNOSIS — I480 Paroxysmal atrial fibrillation: Secondary | ICD-10-CM

## 2021-03-02 LAB — BASIC METABOLIC PANEL
Anion gap: 10 (ref 5–15)
BUN: 20 mg/dL (ref 8–23)
CO2: 28 mmol/L (ref 22–32)
Calcium: 9.7 mg/dL (ref 8.9–10.3)
Chloride: 100 mmol/L (ref 98–111)
Creatinine, Ser: 1.03 mg/dL (ref 0.61–1.24)
GFR, Estimated: >60 mL/min (ref >60)
Glucose, Bld: 166 mg/dL — ABNORMAL HIGH (ref 70–99)
Potassium: 3.6 mmol/L (ref 3.5–5.1)
Sodium: 138 mmol/L (ref 135–145)

## 2021-03-02 LAB — CBC
HCT: 37.1 % — ABNORMAL LOW (ref 39.0–52.0)
Hemoglobin: 12.6 g/dL — ABNORMAL LOW (ref 13.0–17.0)
MCH: 31.7 pg (ref 26.0–34.0)
MCHC: 34 g/dL (ref 30.0–36.0)
MCV: 93.2 fL (ref 80.0–100.0)
Platelets: 263 10*3/uL (ref 150–400)
RBC: 3.98 MIL/uL — ABNORMAL LOW (ref 4.22–5.81)
RDW: 13.5 % (ref 11.5–15.5)
WBC: 9 10*3/uL (ref 4.0–10.5)
nRBC: 0 % (ref 0.0–0.2)

## 2021-03-02 LAB — GLUCOSE, CAPILLARY
Glucose-Capillary: 189 mg/dL — ABNORMAL HIGH (ref 70–99)
Glucose-Capillary: 304 mg/dL — ABNORMAL HIGH (ref 70–99)
Glucose-Capillary: 191 mg/dL — ABNORMAL HIGH (ref 70–99)
Glucose-Capillary: 263 mg/dL — ABNORMAL HIGH (ref 70–99)

## 2021-03-02 LAB — HEPARIN LEVEL (UNFRACTIONATED): Heparin Unfractionated: 0.84 IU/mL — ABNORMAL HIGH (ref 0.30–0.70)

## 2021-03-02 LAB — APTT
aPTT: 102 seconds — ABNORMAL HIGH (ref 24–36)
aPTT: 75 seconds — ABNORMAL HIGH (ref 24–36)

## 2021-03-02 MED ORDER — TRAZODONE HCL 50 MG PO TABS
25.0000 mg | ORAL_TABLET | Freq: Every evening | ORAL | Status: DC | PRN
  Administered 2021-03-02 – 2021-03-04 (×3): 25 mg via ORAL
  Filled 2021-03-02 (×3): qty 1

## 2021-03-02 MED ORDER — DIPHENHYDRAMINE HCL 25 MG PO CAPS: 25.0000 mg | ORAL_CAPSULE | Freq: Every evening | ORAL | Status: DC | PRN

## 2021-03-02 NOTE — Consult Note (Signed)
   Heart Failure Nurse Navigator Note  HFrEF 35-40%  He presented to the emergency room with complaints of worsening shortness of breath and lower extremity edema.  Comorbidities:  Anxiety Coronary artery disease Diabetes Hyperlipidemia Hypertension Obesity Paroxysmal atrial fibrillation  Medications:  Aspirin 81 mg Atorvastatin 80 mg daily Coreg 25 mg 2 times a day Furosemide 40 mg IV every 12 hours Isosorbide/hydralazine 30/27.5 mg 1 tablet 3 times a day Lisinopril 5 mg daily  Labs:  Sodium 138, potassium 3.6, chloride 100, CO2 28, BUN 20, creatinine 1.03,           weight 120 kg, yesterday's weight was 123 kg        Intake 718 mL Output 3415 mL   Initial meeting with patient today, he states that he is familiar with the term heart failure has been dealing with it for several years.  He does not weigh himself on a daily basis, he does not have a scale.  Have contacted [facility] to supply him with a one.    He states that he does not use salt at the table.  Also discussed his fluid restriction daily he takes in 3 to 4 bottles of 16 ounces of water.  In addition he will drink a can of diet [facility] and also  glasses of milk.  Discussed maintaining fluid restriction of no more than 64 ounces in a days time.  He was given the living with heart failure teaching booklet, information on low-sodium and taking charge of your heart failure.  We will continue to follow along.   Tresa Endo RN [facility]

## 2021-03-02 NOTE — Progress Notes (Signed)
Mobility Specialist - Progress Note   03/02/21 1700  Mobility  Activity Ambulated in hall  Level of Assistance Independent  Assistive Device None (pushed IV pole)  Distance Ambulated (ft) 350 ft  Mobility Ambulated independently in hallway  Mobility Response Tolerated well  Mobility performed by Mobility specialist  $Mobility charge 1 Mobility    Pre-mobility: 99 HR, 96% SpO2 During mobility: 104 HR, 91% SpO2 Post-mobility: 99 HR, 97% SpO2   Pt in bed upon arrival, utilizing 2L. Pt requesting to trial ambulation without O2, tolerated well. Pt ambulated in hallway independently, pushing IV pole. No LOB. Denied SOB on RA, O2 >/= 91% throughout activity. Denied fatigue once returned to bed. [place] reconnected to wall and left in reach if needed. RN notified of performance.    Filiberto Pinks Mobility Specialist 03/02/21, 5:06 PM

## 2021-03-02 NOTE — Progress Notes (Signed)
Inpatient Diabetes Program Recommendations  AACE/ADA: New Consensus Statement on Inpatient Glycemic Control   Target Ranges:  Prepandial:   less than 140 mg/dL      Peak postprandial:   less than 180 mg/dL (1-2 hours)      Critically ill patients:  140 - 180 mg/dL   Results for SPERO, GUNNELS (MRN [id number]) as of 03/02/2021 12:10  Ref. Range 03/01/2021 12:37 03/01/2021 16:14 03/01/2021 20:42 03/02/2021 07:22 03/02/2021 11:31  Glucose-Capillary Latest Ref Range: 70 - 99 mg/dL 390 (H) 300 (H) 923 (H) 189 (H) 304 (H)   Review of Glycemic Control  Diabetes history: DM2 Outpatient Diabetes medications: Lantus 42 units daily, Glipizide XL 10 mg daily Current orders for Inpatient glycemic control: Novolog 0-15 units TID with meals  Inpatient Diabetes Program Recommendations:    Insulin: Please consider ordering Lantus 10 units daily and Novolog 3 units TID with meals for meal coverage if patient eats at least 50% of meals.  Thanks, Orlando Penner, RN, MSN, CDE Diabetes Coordinator Inpatient Diabetes Program [phone] (Team Pager from 8am to 5pm)

## 2021-03-02 NOTE — Progress Notes (Signed)
Progress Note  Patient Name: Marc [name] Date of Encounter: 03/02/2021  Primary Cardiologist: Agbor-Etang  Subjective   Dyspnea improving, but back to baseline yet. No chest pain or palpitations. Lower extremity swelling about the same. HS-Tn peaked at 3665. Echo showed an EF of 35-40%, moderate asymmetric LVH of the posterior segment, and mild to moderate aortic valve sclerosis without stenosis. Documented UOP 2.6 L for the past 24 hours with a net - 2.5 L for the admission. Renal function stable. Potassium 3.1-->3.6. HGB 10.9-->12.6. Weight 123 to 120 kg over the past 24 hours.   Inpatient Medications    Scheduled Meds:  aspirin EC  81 mg Oral Daily   atorvastatin  80 mg Oral QPM   carvedilol  25 mg Oral BID   furosemide  40 mg Intravenous Q12H   insulin aspart  0-15 Units Subcutaneous TID WC   isosorbide-hydrALAZINE  1 tablet Oral TID   lisinopril  5 mg Oral Daily   sodium chloride flush  3 mL Intravenous Q12H   Continuous Infusions:  sodium chloride     heparin 1,600 Units/hr (03/01/21 2227)   PRN Meds: sodium chloride, acetaminophen, hydrOXYzine, naphazoline-pheniramine, ondansetron (ZOFRAN) IV, sodium chloride, sodium chloride flush   Vital Signs    Vitals:   03/01/21 2011 03/01/21 2041 03/02/21 0519 03/02/21 0721  BP: (!) 144/89 (!) 149/82 131/82 (!) 162/89  Pulse: 75 67 67 79  Resp: 20  17 17   Temp: 97.9 F (36.6 C) 97.8 F (36.6 C) 98.4 F (36.9 C) 98.4 F (36.9 C)  TempSrc:  Oral  Oral  SpO2: 99% 96% 96% 97%  Weight:   120 kg   Height:        Intake/Output Summary (Last 24 hours) at 03/02/2021 0947 Last data filed at 03/02/2021 0845 Gross per 24 hour  Intake 958.07 ml  Output 3140 ml  Net -2181.93 ml   Filed Weights   03/01/21 0327 03/01/21 1648 03/02/21 0519  Weight: 117.9 kg 123 kg 120 kg    Telemetry    Afib with PVCs - Personally Reviewed  ECG    No new tracings - Personally Reviewed  Physical Exam   GEN: No acute distress.    Neck: No JVD. Cardiac: IRIR, I/VI systolic murmur, no rubs, or gallops.  Respiratory: Improving bibasilar crackles.  GI: Soft, nontender, non-distended.   MS: Continued 1+ bilateral pretibial edema with mild hyperpigmentation; No deformity. Neuro:  Alert and oriented x 3; Nonfocal.  Psych: Normal affect.  Labs    Chemistry Recent Labs  Lab 03/01/21 0351 03/02/21 0620  NA 138 138  K 3.1* 3.6  CL 103 100  CO2 26 28  GLUCOSE 167* 166*  BUN 16 20  CREATININE 1.11 1.03  CALCIUM 9.0 9.7  GFRNONAA >60 >60  ANIONGAP 9 10     Hematology Recent Labs  Lab 03/01/21 0351 03/02/21 0620  WBC 10.5 9.0  RBC 3.44* 3.98*  HGB 10.9* 12.6*  HCT 31.8* 37.1*  MCV 92.4 93.2  MCH 31.7 31.7  MCHC 34.3 34.0  RDW 13.5 13.5  PLT 225 263    Cardiac EnzymesNo results for input(s): TROPONINI in the last 168 hours. No results for input(s): TROPIPOC in the last 168 hours.   BNP Recent Labs  Lab 03/01/21 0351  BNP 920.7*     DDimer No results for input(s): DDIMER in the last 168 hours.   Radiology    DG Chest Portable 1 View  Result Date: 03/01/2021 IMPRESSION: Chronic  cardiomegaly with lower lung volumes with mildly increased interstitial markings since last month. Consider mild or developing interstitial edema. Viral/atypical respiratory infection not excluded. Electronically Signed   By: Odessa Fleming M.D.   On: 03/01/2021 04:13   Cardiac Studies   2D echo 03/01/2021: 1. Left ventricular ejection fraction, by estimation, is 35 to 40%. The  left ventricle has moderately decreased function. Left ventricular  endocardial border not optimally defined to evaluate regional wall motion.  There is moderate asymmetric left  ventricular hypertrophy of the posterior segment. Left ventricular  diastolic function could not be evaluated.   2. Right ventricular systolic function was not well visualized. The right  ventricular size is not well visualized. Mildly increased right  ventricular wall  thickness.   3. The mitral valve is degenerative. Unable to accurately assess mitral  valve regurgitation.   4. The aortic valve is tricuspid. There is mild calcification of the  aortic valve. There is mild thickening of the aortic valve. Aortic valve  regurgitation not well assessed. Mild to moderate aortic valve  sclerosis/calcification is present, without any  evidence of aortic stenosis. __________  2D echo 06/2020: 1. Left ventricular ejection fraction, by estimation, is 40 to 45%. The  left ventricle has mildly decreased function. The left ventricle  demonstrates global hypokinesis. The left ventricular internal cavity size  was moderately dilated. There is moderate   left ventricular hypertrophy. Left ventricular diastolic parameters are  consistent with Grade I diastolic dysfunction (impaired relaxation).   2. Right ventricular systolic function is moderately reduced. The right  ventricular size is normal. Mildly increased right ventricular wall  thickness. There is normal pulmonary artery systolic pressure.   3. Left atrial size was mildly dilated.   4. Right atrial size was mildly dilated.   5. The mitral valve is grossly normal. Trivial mitral valve  regurgitation.   6. The aortic valve is tricuspid. There is mild thickening of the aortic  valve. Aortic valve regurgitation is not visualized. Mild aortic valve  sclerosis is present, with no evidence of aortic valve stenosis.   7. Aortic dilatation noted. There is mild dilatation of the transverse  aorta, measuring 34 mm.   8. The inferior vena cava is normal in size with greater than 50%  respiratory variability, suggesting right atrial pressure of 3 mmHg. __________   2D echo 03/2018: - Left ventricle: The cavity size was mildly dilated. There was    mild concentric hypertrophy. Systolic function was mildly to    moderately reduced. The estimated ejection fraction was in the    range of 40% to 45%. Probable hypokinesis  of the    mid-apicalanteroseptal, anterior, and apical myocardium. Doppler    parameters are consistent with abnormal left ventricular    relaxation (grade 1 diastolic dysfunction).  - Left atrium: The atrium was mildly dilated.  - Right atrium: The atrium was mildly dilated. __________   [facility] 08/2013: Distal left main 60%, mid LAD 80%, D3 30%, OM1 90%, left AV groove 40%, LPL1 90%, LPL2 90%, RCA 100%. EF 35%. Recommendation: CABG.   Patient Profile     60 y.o. male with history of CAD s/p 5-vessel CABG in 09/2013 with LIMA to LAD, sequential SVG to ramus/OM2, and SVG to OM3, chronic combined systolic and diastolic CHF, ICM, persistent Afib s/p recent briefly successful DCCV on 02/23/2021, PAD, DM, HTN, HLD, tobacco use, obesity, and hyperkalemia on ACEi and MRA who is being seen today for the evaluation of elevated troponin, persistent  A. Fib, and acute on chronic combined systolic and diastolic CHF at the request of Dr. Joylene Igo.  Assessment & Plan    1. CAD involving the native coronary arteries status post CABG with NSTEMI: -Chest pain-free -High-sensitivity troponin has trended to 1214, continue to cycle until peak -ASA -Heparin drip -Echo as above -He will need LHC prior to discharge, following diuresis   2.  Acute on chronic combined systolic and diastolic CHF/ICM: -He remains volume up -Continue diuresis with IV Lasix 40 mg twice daily with KCl repletion as indicated  -Tolerating rechallenge of lisinopril to date -He remains off ARNI/MRA in the setting of significant recurrent hyperkalemia -PTA BiDil and carvedilol -Consider addition of SGLT2i prior to discharge -He reports a dry weight of approximately 245 pounds earlier this fall with his office weight at that time noted to be 249 pounds, with a last clinic weight earlier this month 283.5 pounds -Weight down to 264 pounds today (down 3 kg for the admission) -Daily weights -Strict I's and O's   3.  Persistent A.  fib: -Recent, briefly successful DCCV on 02/23/2021 with recurrent A. fib thereafter, with timing of recurrent atrial arrhythmia difficult to to determine -He remains in A. fib with controlled ventricular response -Possibly in the setting of volume overload and hypokalemia -Continue rate control with carvedilol for now with plans to consider repeat DCCV following ischemic evaluation and adequate diuresis, if his A. fib persists -CHA2DS2-VASc 4 (CHF, HTN, DM, vascular disease) -Heparin drip in place of Eliquis while admitted until it is clear he will not require invasive procedures with recommendation to resume OAC prior to discharge -Consider screening for sleep apnea if not already done in the outpatient setting   4.  HTN: -Blood pressure mildly elevated this morning -Management as above, including recently started lisinopril  -Low-sodium diet recommended   5.  HLD: -LDL of 40 from 01/2021 -PTA atorvastatin 80 mg   6.  DM2: -A1c 7.8 in 01/2021 -Per IM   7.  Hypokalemia: -Improving -Replete to goal 4.0 -Rechallenged  with lisinopril upon admission, tolerating at this time -With history of significant hyperkalemia, now that he is on ACEi, will defer adding KCl today  For questions or updates, please contact [facility] Please consult [email or web address] for contact info under Cardiology/STEMI.    Signed, Eula Listen, PA-C [facility] Pager: [phone] 03/02/2021, 9:47 AM

## 2021-03-02 NOTE — Progress Notes (Signed)
PROGRESS NOTE    Marc Knifeimothy L Marsalis  WUJ:[id number] DOB: 06/30/1961 DOA: 03/01/2021 PCP: [facility]   Chief complaint.  Shortness of breath. Brief Narrative:   Marc [name] is a 60 y.o. male with medical history significant for atrial fibrillation status post recent DC cardioversion, history of chronic systolic CHF last known LVEF of 40 to 45%, history of coronary artery disease status post CABG x4, hypertension, diabetes mellitus and dyslipidemia who presents to the emergency room for evaluation of shortness of breath and increased weight gain.  He is diagnosed with acute on chronic systolic congestive heart failure, he was started on IV Lasix.  He was seen by cardiology, peak troponin was 3688, planning heart cath.  Assessment & Plan:   Principal Problem:   Acute on chronic systolic CHF (congestive heart failure) (HCC) Active Problems:   Hypertension   CAD (coronary artery disease)   Type 2 diabetes mellitus without complication (HCC)   Atrial fibrillation (HCC)   Anxiety   Hypokalemia   NSTEMI (non-ST elevated myocardial infarction) (HCC)  #1.  Acute on chronic systolic congestive heart failure. Non-STEMI. Patient is currently on heparin drip, IV Lasix.  Patient has been followed by cardiology. Continue current treatment and to patient short of breath improved.  Planning for cardiac cath in the near future when short of breath is improving.  Repeat echocardiogram pending.  #2.  Paroxysmal atrial fibrillation. Status post recent cardioversion. Eliquis on hold, patient is on heparin.  Type 2 diabetes. Sliding scale insulin for now.  DVT prophylaxis: Heparin Code Status: full Family Communication:  Disposition Plan:    Status is: Inpatient  Remains inpatient appropriate because:Inpatient level of care appropriate due to severity of illness  Dispo: The patient is from: Home              Anticipated d/c is to: Home              Patient currently is  not medically stable to d/c.   Difficult to place patient No        I/O last 3 completed shifts: In: 718.1 [P.O.:360; I.V.:358.1] Out: 3415 [Urine:3415] Total I/O In: 480 [P.O.:480] Out: 825 [Urine:825]     Consultants:  Cardiology  Procedures: None  Antimicrobials: None   Subjective: Patient still has significant leg edema, orthopnea. He also complains short of breath with exertion.  No cough. No chest pain, no palpitation. No dysuria hematuria. No fever or chills.   Objective: Vitals:   03/01/21 2041 03/02/21 0519 03/02/21 0721 03/02/21 1130  BP: (!) 149/82 131/82 (!) 162/89 (!) 143/99  Pulse: 67 67 79 79  Resp:  17 17 18   Temp: 97.8 F (36.6 C) 98.4 F (36.9 C) 98.4 F (36.9 C) 98.2 F (36.8 C)  TempSrc: Oral  Oral Oral  SpO2: 96% 96% 97% 98%  Weight:  120 kg    Height:        Intake/Output Summary (Last 24 hours) at 03/02/2021 1212 Last data filed at 03/02/2021 1132 Gross per 24 hour  Intake 1198.07 ml  Output 3840 ml  Net -2641.93 ml   Filed Weights   03/01/21 0327 03/01/21 1648 03/02/21 0519  Weight: 117.9 kg 123 kg 120 kg    Examination:  General exam: Appears calm and comfortable  Respiratory system: Clear to auscultation. Respiratory effort normal. Cardiovascular system: S1 & S2 heard, RRR. No JVD, murmurs, rubs, gallops or clicks. No pedal edema. Gastrointestinal system: Abdomen is nondistended, soft and nontender. No organomegaly  or masses felt. Normal bowel sounds heard. Central nervous system: Alert and oriented. No focal neurological deficits. Extremities: Symmetric 5 x 5 power. Skin: No rashes, lesions or ulcers Psychiatry: Judgement and insight appear normal. Mood & affect appropriate.     Data Reviewed: I have personally reviewed following labs and imaging studies  CBC: Recent Labs  Lab 03/01/21 0351 03/02/21 0620  WBC 10.5 9.0  NEUTROABS 8.7*  --   HGB 10.9* 12.6*  HCT 31.8* 37.1*  MCV 92.4 93.2  PLT 225 263    Basic Metabolic Panel: Recent Labs  Lab 03/01/21 0351 03/02/21 0620  NA 138 138  K 3.1* 3.6  CL 103 100  CO2 26 28  GLUCOSE 167* 166*  BUN 16 20  CREATININE 1.11 1.03  CALCIUM 9.0 9.7  MG 1.8  --    GFR: Estimated Creatinine Clearance: 102.2 mL/min (by C-G formula based on SCr of 1.03 mg/dL). Liver Function Tests: No results for input(s): AST, ALT, ALKPHOS, BILITOT, PROT, ALBUMIN in the last 168 hours. No results for input(s): LIPASE, AMYLASE in the last 168 hours. No results for input(s): AMMONIA in the last 168 hours. Coagulation Profile: No results for input(s): INR, PROTIME in the last 168 hours. Cardiac Enzymes: No results for input(s): CKTOTAL, CKMB, CKMBINDEX, TROPONINI in the last 168 hours. BNP (last 3 results) No results for input(s): PROBNP in the last 8760 hours. HbA1C: No results for input(s): HGBA1C in the last 72 hours. CBG: Recent Labs  Lab 03/01/21 1237 03/01/21 1614 03/01/21 2042 03/02/21 0722 03/02/21 1131  GLUCAP 226* 241* 182* 189* 304*   Lipid Profile: No results for input(s): CHOL, HDL, LDLCALC, TRIG, CHOLHDL, LDLDIRECT in the last 72 hours. Thyroid Function Tests: No results for input(s): TSH, T4TOTAL, FREET4, T3FREE, THYROIDAB in the last 72 hours. Anemia Panel: No results for input(s): VITAMINB12, FOLATE, FERRITIN, TIBC, IRON, RETICCTPCT in the last 72 hours. Sepsis Labs: No results for input(s): PROCALCITON, LATICACIDVEN in the last 168 hours.  Recent Results (from the past 240 hour(s))  Resp Panel by RT-PCR (Flu A&B, Covid)     Status: None   Collection Time: 03/01/21  4:29 AM  Result Value Ref Range Status   SARS Coronavirus 2 by RT PCR NEGATIVE NEGATIVE Final    Comment: (NOTE) SARS-CoV-2 target nucleic acids are NOT DETECTED.  The SARS-CoV-2 RNA is generally detectable in upper respiratory specimens during the acute phase of infection. The lowest concentration of SARS-CoV-2 viral copies this assay can detect is 138  copies/mL. A negative result does not preclude SARS-Cov-2 infection and should not be used as the sole basis for treatment or other patient management decisions. A negative result may occur with  improper specimen collection/handling, submission of specimen other than nasopharyngeal swab, presence of viral mutation(s) within the areas targeted by this assay, and inadequate number of viral copies(<138 copies/mL). A negative result must be combined with clinical observations, patient history, and epidemiological information. The expected result is Negative.  Fact Sheet for Patients:  BloggerCourse.com  Fact Sheet for Healthcare Providers:  SeriousBroker.it  This test is no t yet approved or cleared by the [place] FDA and  has been authorized for detection and/or diagnosis of SARS-CoV-2 by FDA under an Emergency Use Authorization (EUA). This EUA will remain  in effect (meaning this test can be used) for the duration of the COVID-19 declaration under Section 564(b)(1) of the Act, 21 U.S.C.section 360bbb-3(b)(1), unless the authorization is terminated  or revoked sooner.  Influenza A by PCR NEGATIVE NEGATIVE Final   Influenza B by PCR NEGATIVE NEGATIVE Final    Comment: (NOTE) The Xpert Xpress SARS-CoV-2/FLU/RSV plus assay is intended as an aid in the diagnosis of influenza from Nasopharyngeal swab specimens and should not be used as a sole basis for treatment. Nasal washings and aspirates are unacceptable for Xpert Xpress SARS-CoV-2/FLU/RSV testing.  Fact Sheet for Patients: BloggerCourse.com  Fact Sheet for Healthcare Providers: SeriousBroker.it  This test is not yet approved or cleared by the [place] FDA and has been authorized for detection and/or diagnosis of SARS-CoV-2 by FDA under an Emergency Use Authorization (EUA). This EUA will remain in effect (meaning  this test can be used) for the duration of the COVID-19 declaration under Section 564(b)(1) of the Act, 21 U.S.C. section 360bbb-3(b)(1), unless the authorization is terminated or revoked.  Performed at [facility], [address], [place], [place] [address]          Radiology Studies: DG Chest Portable 1 View  Result Date: 03/01/2021 CLINICAL DATA:  60 year old male with shortness of breath since 0100 hours. EXAM: PORTABLE CHEST 1 VIEW COMPARISON:  Portable chest 01/06/2021 and earlier. FINDINGS: Portable AP upright view at 0349 hours. Mildly lower lung volumes. Stable cardiomegaly and mediastinal contours. Prior sternotomy. Visualized tracheal air column is within normal limits. Mildly increased interstitial markings in both lungs since last month. No pneumothorax, pleural effusion or consolidation. Chronic un-healed left clavicle fracture. Stable visualized osseous structures. IMPRESSION: Chronic cardiomegaly with lower lung volumes with mildly increased interstitial markings since last month. Consider mild or developing interstitial edema. Viral/atypical respiratory infection not excluded. Electronically Signed   By: Odessa Fleming M.D.   On: 03/01/2021 04:13   ECHOCARDIOGRAM COMPLETE  Result Date: 03/01/2021    ECHOCARDIOGRAM REPORT   Patient Name:   CHRISTOHPER DUBE Date of Exam: 03/01/2021 Medical Rec #:  [id number]        Height:       73.0 in Accession #:    [id number]       Weight:       260.0 lb Date of Birth:  21-Nov-1960         BSA:          2.405 m Patient Age:    59 years         BP:           142/96 mmHg Patient Gender: M                HR:           73 bpm. Exam Location:  [facility] Procedure: 2D Echo, Cardiac Doppler and Color Doppler Indications:     CHF-acute systolic I50.21  History:         Patient has prior history of Echocardiogram examinations, most                  recent 06/22/2020. Previous Myocardial Infarction; Risk                  Factors:Diabetes. Ischemic  cardiomyopathy.  Sonographer:     Cristela Blue RDCS (AE) Referring Phys:  [id number] Elwyn Lade AGBATA Diagnosing Phys: Cristal Deer End MD  Sonographer Comments: Technically challenging study due to limited acoustic windows, no apical window and no subcostal window. IMPRESSIONS  1. Left ventricular ejection fraction, by estimation, is 35 to 40%. The left ventricle has moderately decreased function. Left ventricular endocardial border not optimally defined to evaluate regional wall motion. There  is moderate asymmetric left ventricular hypertrophy of the posterior segment. Left ventricular diastolic function could not be evaluated.  2. Right ventricular systolic function was not well visualized. The right ventricular size is not well visualized. Mildly increased right ventricular wall thickness.  3. The mitral valve is degenerative. Unable to accurately assess mitral valve regurgitation.  4. The aortic valve is tricuspid. There is mild calcification of the aortic valve. There is mild thickening of the aortic valve. Aortic valve regurgitation not well assessed. Mild to moderate aortic valve sclerosis/calcification is present, without any evidence of aortic stenosis. FINDINGS  Left Ventricle: Left ventricular ejection fraction, by estimation, is 35 to 40%. The left ventricle has moderately decreased function. Left ventricular endocardial border not optimally defined to evaluate regional wall motion. The left ventricular internal cavity size was normal in size. There is moderate asymmetric left ventricular hypertrophy of the posterior segment. Left ventricular diastolic function could not be evaluated. Right Ventricle: The right ventricular size is not well visualized. Mildly increased right ventricular wall thickness. Right ventricular systolic function was not well visualized. Left Atrium: Left atrial size was not well visualized. Right Atrium: Right atrial size was not well visualized. Pericardium: The pericardium was not  well visualized. Mitral Valve: The mitral valve is degenerative in appearance. There is mild thickening of the mitral valve leaflet(s). There is mild calcification of the mitral valve leaflet(s). Mild mitral annular calcification. Unable to accurately assess mitral valve  regurgitation. Tricuspid Valve: The tricuspid valve is grossly normal. Tricuspid valve regurgitation is mild. Aortic Valve: The aortic valve is tricuspid. There is mild calcification of the aortic valve. There is mild thickening of the aortic valve. Aortic valve regurgitation not well assessed. Mild to moderate aortic valve sclerosis/calcification is present, without any evidence of aortic stenosis. Pulmonic Valve: The pulmonic valve was not well visualized. Pulmonic valve regurgitation is not visualized. No evidence of pulmonic stenosis. Aorta: The aortic root is normal in size and structure. Pulmonary Artery: The pulmonary artery is of normal size. IAS/Shunts: The interatrial septum was not well visualized.  LEFT VENTRICLE PLAX 2D LVIDd:         5.35 cm LVIDs:         3.82 cm LV PW:         1.65 cm LV IVS:        1.15 cm LVOT diam:     2.10 cm LVOT Area:     3.46 cm  LEFT ATRIUM         Index LA diam:    6.20 cm 2.58 cm/m                        PULMONIC VALVE AORTA                 PV Vmax:        0.77 m/s Ao Root diam: 3.47 cm PV Peak grad:   2.4 mmHg                       RVOT Peak grad: 4 mmHg  TRICUSPID VALVE TR Peak grad:   32.7 mmHg TR Vmax:        286.00 cm/s  SHUNTS Systemic Diam: 2.10 cm Yvonne Kendall MD Electronically signed by Yvonne Kendall MD Signature Date/Time: 03/01/2021/4:57:23 PM    Final         Scheduled Meds:  aspirin EC  81 mg Oral Daily   atorvastatin  80 mg  Oral QPM   carvedilol  25 mg Oral BID   furosemide  40 mg Intravenous Q12H   insulin aspart  0-15 Units Subcutaneous TID WC   isosorbide-hydrALAZINE  1 tablet Oral TID   lisinopril  5 mg Oral Daily   sodium chloride flush  3 mL Intravenous Q12H    Continuous Infusions:  sodium chloride     heparin 1,600 Units/hr (03/01/21 2227)     LOS: 1 day    Time spent: 28 minutes    Marrion Coy, MD [facility]   To contact the attending provider between 7A-7P or the covering provider during after hours 7P-7A, please log into the web site [email or web address] and access using universal [place] password for that web site. If you do not have the password, please call the hospital operator.  03/02/2021, 12:12 PM

## 2021-03-02 NOTE — TOC Initial Note (Signed)
Transition of Care [facility]) - Initial/Assessment Note    Patient Details  Name: Marc [name] MRN: [id number] Date of Birth: August 15, 1961  Transition of Care [facility]) CM/SW Contact:    Alberteen Sam, LCSW Phone Number: 03/02/2021, 1:47 PM  Clinical Narrative:                  CSW met with patient at bedside to complete heart failure home health screen. Patient reports he has a PCP he follows up with, reports no issues with getting his medications and states no problems with transportation to and from appointments as he has a car and drives himself.  Patient reports he does not currently have or use a scale, CSW Will provide one at bedside prior to discharge.   Patient reports he would be interested in a home health RN at discharge for CHF management of symptoms, CSW has spoken with Corene Cornea with [facility] who can accept.   No other questions or concerns at this time.  Expected Discharge Plan: [place] Barriers to Discharge: Continued Medical Work up   Patient Goals and CMS Choice Patient states their goals for this hospitalization and ongoing recovery are:: to go home CMS Medicare.gov Compare Post Acute Care list provided to:: Patient Choice offered to / list presented to : Patient  Expected Discharge Plan and Services Expected Discharge Plan: [place]       Living arrangements for the past 2 months: Single Family Home                           HH Arranged: RN [facility] Agency: [place] ([place]) Date HH Agency Contacted: 03/02/21 Time HH Agency Contacted: [place] Representative spoke with at [place]: Corene Cornea  Prior Living Arrangements/Services Living arrangements for the past 2 months: [place] with:: Self Patient language and need for interpreter reviewed:: Yes Do you feel safe going back to the place where you live?: Yes      Need for Family Participation in Patient Care: Yes (Comment) Care giver  support system in place?: Yes (comment)   Criminal Activity/Legal Involvement Pertinent to Current Situation/Hospitalization: No - Comment as needed  Activities of Daily Living Home Assistive Devices/Equipment: None ADL Screening (condition at time of admission) Patient's cognitive ability adequate to safely complete daily activities?: No Is the patient deaf or have difficulty hearing?: No Does the patient have difficulty seeing, even when wearing glasses/contacts?: No Does the patient have difficulty concentrating, remembering, or making decisions?: No Patient able to express need for assistance with ADLs?: Yes Does the patient have difficulty dressing or bathing?: No Independently performs ADLs?: Yes (appropriate for developmental age) Does the patient have difficulty walking or climbing stairs?: No Weakness of Legs: None Weakness of Arms/Hands: None  Permission Sought/Granted                  Emotional Assessment Appearance:: Appears stated age Attitude/Demeanor/Rapport: Gracious Affect (typically observed): Calm Orientation: : Oriented to Self, Oriented to Place, Oriented to  Time, Oriented to Situation Alcohol / Substance Use: Not Applicable Psych Involvement: No (comment)  Admission diagnosis:  Acute respiratory failure with hypoxia ([place]) [J96.01] CHF, acute on chronic ([place]) [I50.9] Acute on chronic congestive heart failure, unspecified heart failure type [facility]) [I50.9] Patient Active Problem List   Diagnosis Date Noted   CHF, acute on chronic ([place]) 03/01/2021   Anxiety    Hypokalemia  NSTEMI (non-ST elevated myocardial infarction) (HCC)    Type II diabetes mellitus with renal manifestations (HCC) 01/06/2021   Tobacco abuse 01/06/2021   CKD (chronic kidney disease), stage IIIa 01/06/2021   Hyponatremia 01/06/2021   Elevated troponin 01/06/2021   Nausea & vomiting 01/06/2021   Generalized weakness    Hyperkalemia 11/20/2020   Cellulitis and abscess of toe of  left foot 04/05/2020   Diabetes (HCC) 03/14/2018   Blurred vision 03/13/2018   Cellulitis of toe, right 02/29/2016   Atherosclerosis of native arteries of the extremities with ulceration (HCC) 10/21/2014   Acute ischemic colitis (HCC) 09/19/2014   Aortic aneurysm with dissection (HCC) 09/19/2014   Type 2 diabetes mellitus without complication (HCC) 09/19/2014   Encounter for therapeutic drug monitoring 09/26/2013   CAD (coronary artery disease) 09/16/2013   Chronic coronary artery disease 09/16/2013   Coronary artery disease    PAF (paroxysmal atrial fibrillation) (HCC) 07/29/2013   Atrial fibrillation (HCC) 07/29/2013   Hypertension    Hyperlipidemia    Acute on chronic systolic CHF (congestive heart failure) (HCC) 07/05/2013   PCP:  [facility] Pharmacy:   [place] [facility] [address] [place] [address] Phone: [phone] Fax: [phone]  [facility] - [place], [place] - [address] [place] [address] Phone: [phone] Fax: [phone]  [id number] - [place], [place] - [address] [place]  [address] Phone: [phone] Fax: [phone]     Social Determinants of Health (SDOH) Interventions    Readmission Risk Interventions No flowsheet data found.   

## 2021-03-02 NOTE — Consult Note (Addendum)
ANTICOAGULATION CONSULT NOTE   Pharmacy Consult for heparin Indication: chest pain/ACS  Patient Measurements: Heparin Dosing Weight: 105.3 kg  Labs: Recent Labs    03/01/21 0351 03/01/21 0546 03/01/21 0809 03/01/21 1239 03/01/21 1334 03/01/21 1642 03/01/21 1955 03/02/21 0620  HGB 10.9*  --   --   --   --   --   --  12.6*  HCT 31.8*  --   --   --   --   --   --  37.1*  PLT 225  --   --   --   --   --   --  263  APTT  --    < > 36  --  74*  --  59* 102*  HEPARINUNFRC  --   --  0.77*  --   --   --   --  0.84*  CREATININE 1.11  --   --   --   --   --   --   --   TROPONINIHS 36*   < > 1,214* 3,688*  --  3,665*  --   --    < > = values in this interval not displayed.     Estimated Creatinine Clearance: 94.8 mL/min (by C-G formula based on SCr of 1.11 mg/dL).   Medical History: Past Medical History:  Diagnosis Date   Anxiety    Chronic systolic heart failure (HCC)    a. 07/2013 Echo: Ejection fraction of 45-50% with possible inferior wall hypokinesis; b. 03/2018 Echo: EF 40-45%; c. 06/2020 Echo: EF 40-45%, glob HK. Mod LVH, gr1 DD. Nl PASP. Mild BAE. Triv MR. Mild AoV sclerosis. Mild Ao dil (81mm).   Coronary artery disease    Significant left main and Severe three-vessel coronary artery disease in December of 2014. He underwent CABG in January of 2015 with LIMA to LAD, sequential SVG to ramus/OM 2 and SVG to OM 3   Diabetes mellitus without complication (HCC)    Hyperlipidemia    Hypertension    Ischemic cardiomyopathy    a. 07/2013 Echo: EF 45-50%; b. 03/2018 Echo: EF 40-45%; c. 06/2020 Echo: EF 40-45%.   Medical non-compliance    MI (myocardial infarction) (HCC)    Obesity    PAD (peripheral artery disease) (HCC)    a. 04/2020 PTA of L PT.   PAF (paroxysmal atrial fibrillation) (HCC)    Tobacco abuse     Medications:  Eliquis 5 mg BID   Assessment: 60 y.o. male with history of CHF, CAD, HTN, diabetes, HLD, paroxysmal atrial fibrillation on Eliquis who presents to  the emergency department with shortness of breath and bilateral lower extremity swelling. Patient was found to have troponin elevated to 190. Pharmacy has been consulted for heparin dosing for ACS.   Hgb: 10.9; plts 225. Baseline HL 0.77, aPTT 36s  6/28 1334 aPTT 74 6/28 1955 aPTT 59 6/29 0620 aPTT 102, HL 0.84  Goal of Therapy:  Heparin level 0.3-0.7 units/ml once aPTT and HL correlates.  aPTT 66-102 seconds Monitor platelets by anticoagulation protocol: Yes   Plan:  --aPTT is therapeutic, borderline supratherapeutic  --Continue heparin infusion rate at 1600 units/hr --Re-check aPTT 6 hours to confirm --Daily CBC per protocol while on IV heparin  Otelia Sergeant, PharmD, [facility] 03/02/2021 7:09 AM

## 2021-03-02 NOTE — Consult Note (Signed)
ANTICOAGULATION CONSULT NOTE   Pharmacy Consult for heparin Indication: chest pain/ACS  Patient Measurements: Heparin Dosing Weight: 105.3 kg  Labs: Recent Labs    03/01/21 0351 03/01/21 0546 03/01/21 0809 03/01/21 1239 03/01/21 1334 03/01/21 1642 03/01/21 1955 03/02/21 0620 03/02/21 1209  HGB 10.9*  --   --   --   --   --   --  12.6*  --   HCT 31.8*  --   --   --   --   --   --  37.1*  --   PLT 225  --   --   --   --   --   --  263  --   APTT  --   --  36  --    < >  --  59* 102* 75*  HEPARINUNFRC  --   --  0.77*  --   --   --   --  0.84*  --   CREATININE 1.11  --   --   --   --   --   --  1.03  --   TROPONINIHS 36*   < > 1,214* 3,688*  --  3,665*  --   --   --    < > = values in this interval not displayed.     Estimated Creatinine Clearance: 102.2 mL/min (by C-G formula based on SCr of 1.03 mg/dL).   Medical History: Past Medical History:  Diagnosis Date   Anxiety    Chronic systolic heart failure (HCC)    a. 07/2013 Echo: Ejection fraction of 45-50% with possible inferior wall hypokinesis; b. 03/2018 Echo: EF 40-45%; c. 06/2020 Echo: EF 40-45%, glob HK. Mod LVH, gr1 DD. Nl PASP. Mild BAE. Triv MR. Mild AoV sclerosis. Mild Ao dil (21mm).   Coronary artery disease    Significant left main and Severe three-vessel coronary artery disease in December of 2014. He underwent CABG in January of 2015 with LIMA to LAD, sequential SVG to ramus/OM 2 and SVG to OM 3   Diabetes mellitus without complication (HCC)    Hyperlipidemia    Hypertension    Ischemic cardiomyopathy    a. 07/2013 Echo: EF 45-50%; b. 03/2018 Echo: EF 40-45%; c. 06/2020 Echo: EF 40-45%.   Medical non-compliance    MI (myocardial infarction) (HCC)    Obesity    PAD (peripheral artery disease) (HCC)    a. 04/2020 PTA of L PT.   PAF (paroxysmal atrial fibrillation) (HCC)    Tobacco abuse     Medications:  Eliquis 5 mg BID   Assessment: 60 y.o. male with history of CHF, CAD, HTN, diabetes, HLD,  paroxysmal atrial fibrillation on Eliquis who presents to the emergency department with shortness of breath and bilateral lower extremity swelling. Patient was found to have troponin elevated to 190. Pharmacy has been consulted for heparin dosing for ACS.   Hgb: 10.9>12.6; plts 225>263. 6/28 Baseline HL 0.77, aPTT 36s  Date Time aPTT/HL Rate/Comment 6/28  1334  74s / --  1400 un/hr 6/28 1955 59s / --  1400 > 1600 un/hr 6/29 0620 102s / 0.84 Thera x1; 1600 un/hr 6/29 1209 75s / --  Thera x2; 1600 un/hr  Goal of Therapy:  Heparin level 0.3-0.7 units/ml once aPTT and HL correlates.  aPTT 66-102 seconds Monitor platelets by anticoagulation protocol: Yes   Plan:  aPTT is therapeutic twice consecutively. At last interval check; anti-xa/aPTT correlation may have been seen. Will re-assess with AM labs tomorrow  then likely only anti-xa levels after that.  Continue heparin infusion rate at 1600 units/hr Re-check aPTT/HL with AM labs tomorrow CTM with Daily CBC per protocol while on IV heparin  Martyn Malay, [facility] 03/02/2021 12:38 PM

## 2021-03-03 DIAGNOSIS — E119 Type 2 diabetes mellitus without complications: Secondary | ICD-10-CM

## 2021-03-03 DIAGNOSIS — Z794 Long term (current) use of insulin: Secondary | ICD-10-CM

## 2021-03-03 DIAGNOSIS — J9601 Acute respiratory failure with hypoxia: Secondary | ICD-10-CM

## 2021-03-03 DIAGNOSIS — I25118 Atherosclerotic heart disease of native coronary artery with other forms of angina pectoris: Secondary | ICD-10-CM

## 2021-03-03 LAB — BASIC METABOLIC PANEL
Anion gap: 9 (ref 5–15)
BUN: 20 mg/dL (ref 8–23)
CO2: 27 mmol/L (ref 22–32)
Calcium: 9.3 mg/dL (ref 8.9–10.3)
Chloride: 100 mmol/L (ref 98–111)
Creatinine, Ser: 1.01 mg/dL (ref 0.61–1.24)
GFR, Estimated: >60 mL/min (ref >60)
Glucose, Bld: 263 mg/dL — ABNORMAL HIGH (ref 70–99)
Potassium: 3.3 mmol/L — ABNORMAL LOW (ref 3.5–5.1)
Sodium: 136 mmol/L (ref 135–145)

## 2021-03-03 LAB — CBC
HCT: 36.9 % — ABNORMAL LOW (ref 39.0–52.0)
Hemoglobin: 12.6 g/dL — ABNORMAL LOW (ref 13.0–17.0)
MCH: 31.2 pg (ref 26.0–34.0)
MCHC: 34.1 g/dL (ref 30.0–36.0)
MCV: 91.3 fL (ref 80.0–100.0)
Platelets: 248 10*3/uL (ref 150–400)
RBC: 4.04 MIL/uL — ABNORMAL LOW (ref 4.22–5.81)
RDW: 13.5 % (ref 11.5–15.5)
WBC: 7.6 10*3/uL (ref 4.0–10.5)
nRBC: 0 % (ref 0.0–0.2)

## 2021-03-03 LAB — GLUCOSE, CAPILLARY
Glucose-Capillary: 275 mg/dL — ABNORMAL HIGH (ref 70–99)
Glucose-Capillary: 259 mg/dL — ABNORMAL HIGH (ref 70–99)
Glucose-Capillary: 282 mg/dL — ABNORMAL HIGH (ref 70–99)
Glucose-Capillary: 312 mg/dL — ABNORMAL HIGH (ref 70–99)

## 2021-03-03 LAB — APTT: aPTT: 70 seconds — ABNORMAL HIGH (ref 24–36)

## 2021-03-03 LAB — HEPARIN LEVEL (UNFRACTIONATED): Heparin Unfractionated: 0.53 IU/mL (ref 0.30–0.70)

## 2021-03-03 LAB — MAGNESIUM: Magnesium: 1.9 mg/dL (ref 1.7–2.4)

## 2021-03-03 MED ORDER — FUROSEMIDE 10 MG/ML IJ SOLN
80.0000 mg | Freq: Two times a day (BID) | INTRAMUSCULAR | Status: DC
  Administered 2021-03-03 – 2021-03-04 (×2): 80 mg via INTRAVENOUS
  Filled 2021-03-03 (×2): qty 8

## 2021-03-03 MED ORDER — INSULIN ASPART 100 UNIT/ML IJ SOLN
3.0000 [IU] | Freq: Three times a day (TID) | INTRAMUSCULAR | Status: DC
  Administered 2021-03-03 – 2021-03-04 (×4): 3 [IU] via SUBCUTANEOUS
  Filled 2021-03-03 (×4): qty 1

## 2021-03-03 MED ORDER — SPIRONOLACTONE 25 MG PO TABS
12.5000 mg | ORAL_TABLET | Freq: Every day | ORAL | Status: DC
  Filled 2021-03-03: qty 0.5

## 2021-03-03 MED ORDER — LOSARTAN POTASSIUM 25 MG PO TABS
25.0000 mg | ORAL_TABLET | Freq: Every day | ORAL | Status: DC
  Administered 2021-03-04: 25 mg via ORAL
  Filled 2021-03-03: qty 1

## 2021-03-03 MED ORDER — POTASSIUM CHLORIDE CRYS ER 20 MEQ PO TBCR
20.0000 meq | EXTENDED_RELEASE_TABLET | Freq: Once | ORAL | Status: AC
  Administered 2021-03-03: 20 meq via ORAL
  Filled 2021-03-03: qty 1

## 2021-03-03 MED ORDER — INSULIN GLARGINE 100 UNIT/ML ~~LOC~~ SOLN
10.0000 [IU] | Freq: Every day | SUBCUTANEOUS | Status: DC
  Administered 2021-03-03 – 2021-03-04 (×2): 10 [IU] via SUBCUTANEOUS
  Filled 2021-03-03 (×2): qty 0.1

## 2021-03-03 MED ORDER — FUROSEMIDE 10 MG/ML IJ SOLN
40.0000 mg | Freq: Once | INTRAMUSCULAR | Status: AC
  Administered 2021-03-03: 40 mg via INTRAVENOUS
  Filled 2021-03-03: qty 4

## 2021-03-03 MED ORDER — POTASSIUM CHLORIDE 10 MEQ/100ML IV SOLN
10.0000 meq | INTRAVENOUS | Status: DC
  Filled 2021-03-03 (×2): qty 100

## 2021-03-03 MED ORDER — POTASSIUM CHLORIDE CRYS ER 20 MEQ PO TBCR
20.0000 meq | EXTENDED_RELEASE_TABLET | Freq: Two times a day (BID) | ORAL | Status: DC
  Administered 2021-03-03 – 2021-03-08 (×10): 20 meq via ORAL
  Filled 2021-03-03 (×10): qty 1

## 2021-03-03 NOTE — Progress Notes (Signed)
Inpatient Diabetes Program Recommendations  AACE/ADA: New Consensus Statement on Inpatient Glycemic Control   Target Ranges:  Prepandial:   less than 140 mg/dL      Peak postprandial:   less than 180 mg/dL (1-2 hours)      Critically ill patients:  140 - 180 mg/dL  Results for Marc [name], Marc [name] (MRN [id number]) as of 03/03/2021 10:15  Ref. Range 03/02/2021 07:22 03/02/2021 11:31 03/02/2021 16:47 03/02/2021 21:22 03/03/2021 07:14  Glucose-Capillary Latest Ref Range: 70 - 99 mg/dL 606 (H) 301 (H) 601 (H) 263 (H) 275 (H)   Review of Glycemic Control  Diabetes history: DM2 Outpatient Diabetes medications: Lantus 42 units daily, Glipizide XL 10 mg daily Current orders for Inpatient glycemic control: Novolog 0-15 units TID with meals   Inpatient Diabetes Program Recommendations:     Insulin: Please consider ordering Lantus 10 units daily and Novolog 3 units TID with meals for meal coverage if patient eats at least 50% of meals.  Thanks, Orlando Penner, RN, MSN, CDE Diabetes Coordinator Inpatient Diabetes Program [phone] (Team Pager from 8am to 5pm)

## 2021-03-03 NOTE — Progress Notes (Signed)
Chaplain made initial visit to room per nurse paging On Call Chaplain. Patient's family member explained they are looking for witnesses to sign a will. Chaplain shared Advanced Directive information and explained that notary and witnesses are gathered for AD signing specifically. Family appreciated Chaplain's visit. Continued support available as needed.

## 2021-03-03 NOTE — H&P (View-Only) (Signed)
Progress Note  Patient Name: Marc [name] Date of Encounter: 03/03/2021  Primary Cardiologist: Debbe Odea, MD  Subjective   Shortness of breath is improving.  Able to lie flat last night. Denies chest pain or palpitations. He says swelling to his abdomen and thighs are back to his baseline.   Inpatient Medications    Scheduled Meds:  aspirin EC  81 mg Oral Daily   atorvastatin  80 mg Oral QPM   carvedilol  25 mg Oral BID   furosemide  80 mg Intravenous BID   insulin aspart  0-15 Units Subcutaneous TID WC   insulin aspart  3 Units Subcutaneous TID WC   insulin glargine  10 Units Subcutaneous Daily   isosorbide-hydrALAZINE  1 tablet Oral TID   [START ON 03/04/2021] losartan  25 mg Oral Daily   potassium chloride  20 mEq Oral BID   sodium chloride flush  3 mL Intravenous Q12H   Continuous Infusions:  sodium chloride     heparin 1,600 Units/hr (03/03/21 0600)   PRN Meds: sodium chloride, acetaminophen, diphenhydrAMINE, hydrOXYzine, naphazoline-pheniramine, ondansetron (ZOFRAN) IV, sodium chloride, sodium chloride flush, traZODone   Vital Signs    Vitals:   03/03/21 0500 03/03/21 0510 03/03/21 0714 03/03/21 1129  BP:  108/60 (!) 157/92 136/82  Pulse:  (!) 44 87 84  Resp:  18 18 18   Temp:   98.4 F (36.9 C) 97.7 F (36.5 C)  TempSrc:   Oral Oral  SpO2:  95% 97% 94%  Weight: 119.5 kg     Height:        Intake/Output Summary (Last 24 hours) at 03/03/2021 1141 Last data filed at 03/03/2021 1129 Gross per 24 hour  Intake 2428.7 ml  Output 4750 ml  Net -2321.3 ml   Filed Weights   03/01/21 1648 03/02/21 0519 03/03/21 0500  Weight: 123 kg 120 kg 119.5 kg    Physical Exam   GEN: Obese, in no acute distress.  HEENT: Grossly normal.  Neck: Supple, moderately elevated JVP. Cardiac: IR,IR, no murmurs, rubs, or gallops. No clubbing or cyanosis. 1-2+ bilat LE woody edema. Radials 2+, DP and PT 1+ and equal bilaterally.  Respiratory:  Respirations regular and  unlabored, clear to auscultation bilaterally. GI: Obese, soft, nontender, nondistended, BS + x 4. MS: no deformity or atrophy. Skin: warm and dry. Neuro:  Strength and sensation are intact. Psych: Normal affect.  Labs    Chemistry Recent Labs  Lab 03/01/21 0351 03/02/21 0620 03/03/21 0534  NA 138 138 136  K 3.1* 3.6 3.3*  CL 103 100 100  CO2 26 28 27   GLUCOSE 167* 166* 263*  BUN 16 20 20   CREATININE 1.11 1.03 1.01  CALCIUM 9.0 9.7 9.3  GFRNONAA >60 >60 >60  ANIONGAP 9 10 9      Hematology Recent Labs  Lab 03/01/21 0351 03/02/21 0620 03/03/21 0534  WBC 10.5 9.0 7.6  RBC 3.44* 3.98* 4.04*  HGB 10.9* 12.6* 12.6*  HCT 31.8* 37.1* 36.9*  MCV 92.4 93.2 91.3  MCH 31.7 31.7 31.2  MCHC 34.3 34.0 34.1  RDW 13.5 13.5 13.5  PLT 225 263 248    Cardiac Enzymes  Recent Labs  Lab 03/01/21 0351 03/01/21 0546 03/01/21 0809 03/01/21 1239 03/01/21 1642  TROPONINIHS 36* 190* 1,214* 3,688* 3,665*      BNP Recent Labs  Lab 03/01/21 0351  BNP 920.7*    Lipids  Lab Results  Component Value Date   CHOL 80 01/07/2021   HDL 26 (  L) 01/07/2021   LDLCALC 40 01/07/2021   TRIG 69 01/07/2021   CHOLHDL 3.1 01/07/2021    HbA1c  Lab Results  Component Value Date   HGBA1C 7.8 (H) 01/07/2021    Radiology    DG Chest Portable 1 View  Result Date: 03/01/2021 CLINICAL DATA:  60 year old male with shortness of breath since 0100 hours. EXAM: PORTABLE CHEST 1 VIEW COMPARISON:  Portable chest 01/06/2021 and earlier. FINDINGS: Portable AP upright view at 0349 hours. Mildly lower lung volumes. Stable cardiomegaly and mediastinal contours. Prior sternotomy. Visualized tracheal air column is within normal limits. Mildly increased interstitial markings in both lungs since last month. No pneumothorax, pleural effusion or consolidation. Chronic un-healed left clavicle fracture. Stable visualized osseous structures. IMPRESSION: Chronic cardiomegaly with lower lung volumes with mildly  increased interstitial markings since last month. Consider mild or developing interstitial edema. Viral/atypical respiratory infection not excluded. Electronically Signed   By: Odessa Fleming M.D.   On: 03/01/2021 04:13   Telemetry    A-fib with PVCs, with 3 beats of NSVTs, 80-90s bpms  - Personally Reviewed  Cardiac Studies   2D Echocardiogram 06.28.2022  1. Left ventricular ejection fraction, by estimation, is 35 to 40%. The  left ventricle has moderately decreased function. Left ventricular  endocardial border not optimally defined to evaluate regional wall motion. There is moderate asymmetric left ventricular hypertrophy of the posterior segment. Left ventricular diastolic function could not be evaluated.   2. Right ventricular systolic function was not well visualized. The right ventricular size is not well visualized. Mildly increased right  ventricular wall thickness.   3. The mitral valve is degenerative. Unable to accurately assess mitral valve regurgitation.   4. The aortic valve is tricuspid. There is mild calcification of the  aortic valve. There is mild thickening of the aortic valve. Aortic valve regurgitation not well assessed. Mild to moderate aortic valve  sclerosis/calcification is present, without any  evidence of aortic stenosis.   2D Echocardiogram 10.19.2021   1. Left ventricular ejection fraction, by estimation, is 40 to 45%. The  left ventricle has mildly decreased function. The left ventricle  demonstrates global hypokinesis. The left ventricular internal cavity size was moderately dilated. There is moderate left ventricular hypertrophy. Left ventricular diastolic parameters are  consistent with Grade I diastolic dysfunction (impaired relaxation).   2. Right ventricular systolic function is moderately reduced. The right ventricular size is normal. Mildly increased right ventricular wall thickness. There is normal pulmonary artery systolic pressure.   3. Left atrial size  was mildly dilated.   4. Right atrial size was mildly dilated.   5. The mitral valve is grossly normal. Trivial mitral valve regurgitation.   6. The aortic valve is tricuspid. There is mild thickening of the aortic valve. Aortic valve regurgitation is not visualized. Mild aortic valve sclerosis is present, with no evidence of aortic valve stenosis.   7. Aortic dilatation noted. There is mild dilatation of the transverse  aorta, measuring 34 mm.   8. The inferior vena cava is normal in size with greater than 50%  respiratory variability, suggesting right atrial pressure of 3 mmHg.  Patient Profile     60 y.o. male with history of CAD s/p 5-vessel CABG in 2015 with LIMA to LAD, sequential SVG to ramus/OM2, and SVG to OM3, chronic combined systolic and diastolic CHF, ICM, persistent a-fib s/p DCCV on 02/23/21, PAD, DM, HTN, HLD, obesity, who was admitted 6/28 w/ acute CHF and recurrent AFib and noted to have NSTEMI  w/ peak HsTrop of 3688.  Assessment & Plan    1. NSTEMI/CAD involving native coronary arteries s/p CABG: - Patient denies chest pain this morning during rounds.  - Continue ASA, statin, ? blocker, and heparin gtt. - Tentative plan for LHC for 7/1.  The patient understands that risks include but are not limited to stroke (1 in 1000), death (1 in 1000), kidney failure [usually temporary] (1 in 500), bleeding (1 in 200), allergic reaction [possibly serious] (1 in 200), and agrees to proceed.    2. Acute on chronic combined systolic and diastolic CHF/ICM:  - Remains volume up, but feeling closer to baseline and able to lie flat. - Neg 2.1L yesterday and minus 5.4L since admission.  Wt down from 123 kg on 6/28 to 119.5 this AM.  Prev dry wt somewhere between 245 and 250 lbs, so still at least 13 lbs away. - Renal fxn remains stable.  I will escalate diuresis to 80 mg BID. - Continue PTA BiDil and Carvedilol - With plan to transition to entresto, I d/c'd lisinopril (received this AM), and  replaced w/ losartan for tomorrow.  We can then transition to entresto for Saturday.   - Considered starting Spironolactone, however pt w/ recent hyperkalemia in the setting of spiro.  I've added spiro to adverse rxn list.  3. Persistent A-fib:  - Patient recently had DCCV on 02/23/2021 with recurrent a.fib noted on admission. - He remains in a.fib with controlled ventricular response - 80's to 90's. - Continue 25 mg Carvedilol BID for rate control  - eliquis on hold in prep for cath. - Following cath, will need to consider initiation of AAD (tikosyn vs amio), along w/ resumption of OAC.  Would prob plan outpt DCCV at some point in 3-4 wks if he doesn't convert.  4. HTN:  - BP remain elevated this morning - Follow w/ escalation of diuretic rx.  As above, transitioning to ARB for tomorrow w/ plan to transition to entresto on Saturday.  Otw, cont ? blocker and hydral/nitrate.  5. HLD:  - Continue home dose of 80 mg atorvastatin.  LDL 40.  6. DM2:  - HA1C: 7.8  - SSI per IM.   7. Hypokalemia:  - K: 3.3 - Started on KCl 20 mEq BID. Considered adding spiro but prev hyperK+ in May.   Signed, Nicolasa Ducking, NP  03/03/2021, 11:41 AM    For questions or updates, please contact   Please consult [email or web address] for contact info under Cardiology/STEMI.    Attending Note Patient seen and examined, agree with detailed note above,   Patient presentation and plan discussed on rounds.    EKG lab work, chest x-ray, echocardiogram reviewed independently by myself  Sitting up at the side of bed eating on rounds  reports still having significant leg edema abdominal distention Shortness of breath improving  On examination : Morbidly obese alert oriented, no JVD, lungs clear to auscultation bilaterally, heart sounds regular normal S1-S2 no murmurs appreciated, abdomen soft nontender no significant lower extremity edema.  Musculoskeletal exam with good range of motion, neurologic exam grossly  nonfocal  Lab work reviewed sodium 136 potassium 3.3 creatinine 1.0 BUN 20 WBC 7.6 hematocrit 36  A/P: Non-STEMI Known coronary disease, history of CABG Presenting with shortness of breath, worsening CHF symptoms leg edema, Cardiomyopathy ejection fraction 35 to 40% -Discussed work-up with him, plan for cardiac catheterization tomorrow I have reviewed the risks, indications, and alternatives to cardiac catheterization, possible angioplasty, and stenting with the patient.  Risks include but are not limited to bleeding, infection, vascular injury, stroke, myocardial infection, arrhythmia, kidney injury, radiation-related injury in the case of prolonged fluoroscopy use, emergency cardiac surgery, and death. The patient understands the risks of serious complication is 1-2 in 1000 with diagnostic cardiac cath and 1-2% or less with angioplasty/stenting.  -He is willing to proceed, scheduled for tomorrow morning  Paroxysmal atrial fibrillation Cardioversion February 23, 2021, recurrent atrial fibrillation Currently on carvedilol 25 twice daily for rate control Consider antiarrhythmic/amiodarone versus Tikosyn following cardiac catheterization Currently on heparin infusion/bridge  Long discussion concerning risk and benefit of cardiac catheterization, he is willing to proceed Discussed management of his atrial fibrillation Greater than 50% was spent in counseling and coordination of care with patient Total encounter time 35 minutes or more   Signed: Dossie Arbour  M.D., Ph.D. [facility]

## 2021-03-03 NOTE — Progress Notes (Signed)
Pt is A&O, VS stable, afib rate controlled on the monitor. IV x2 in place. Heparin drip at 1600/hr. No complaints other than unable to sleep. MD contacted who ordered trazadone without relief. RA, using urinal at bedside.

## 2021-03-03 NOTE — Consult Note (Signed)
ANTICOAGULATION CONSULT NOTE   Pharmacy Consult for heparin Indication: chest pain/ACS  Patient Measurements: Heparin Dosing Weight: 105.3 kg  Labs: Recent Labs    03/01/21 0351 03/01/21 0546 03/01/21 0809 03/01/21 1239 03/01/21 1334 03/01/21 1642 03/01/21 1955 03/02/21 0620 03/02/21 1209 03/03/21 0534  HGB 10.9*  --   --   --   --   --   --  12.6*  --  12.6*  HCT 31.8*  --   --   --   --   --   --  37.1*  --  36.9*  PLT 225  --   --   --   --   --   --  263  --  248  APTT  --   --  36  --    < >  --    < > 102* 75* 70*  HEPARINUNFRC  --   --  0.77*  --   --   --   --  0.84*  --  0.53  CREATININE 1.11  --   --   --   --   --   --  1.03  --  1.01  TROPONINIHS 36*   < > 1,214* 3,688*  --  3,665*  --   --   --   --    < > = values in this interval not displayed.     Estimated Creatinine Clearance: 104 mL/min (by C-G formula based on SCr of 1.01 mg/dL).   Medical History: Past Medical History:  Diagnosis Date   Anxiety    Chronic systolic heart failure (HCC)    a. 07/2013 Echo: Ejection fraction of 45-50% with possible inferior wall hypokinesis; b. 03/2018 Echo: EF 40-45%; c. 06/2020 Echo: EF 40-45%, glob HK. Mod LVH, gr1 DD. Nl PASP. Mild BAE. Triv MR. Mild AoV sclerosis. Mild Ao dil (77mm).   Coronary artery disease    Significant left main and Severe three-vessel coronary artery disease in December of 2014. He underwent CABG in January of 2015 with LIMA to LAD, sequential SVG to ramus/OM 2 and SVG to OM 3   Diabetes mellitus without complication (HCC)    Hyperlipidemia    Hypertension    Ischemic cardiomyopathy    a. 07/2013 Echo: EF 45-50%; b. 03/2018 Echo: EF 40-45%; c. 06/2020 Echo: EF 40-45%.   Medical non-compliance    MI (myocardial infarction) (HCC)    Obesity    PAD (peripheral artery disease) (HCC)    a. 04/2020 PTA of L PT.   PAF (paroxysmal atrial fibrillation) (HCC)    Tobacco abuse     Medications:  Eliquis 5 mg BID   Assessment: 60 y.o. male with  history of CHF, CAD, HTN, diabetes, HLD, paroxysmal atrial fibrillation on Eliquis who presents to the emergency department with shortness of breath and bilateral lower extremity swelling. Patient was found to have troponin elevated to 190. Pharmacy has been consulted for heparin dosing for ACS.   Hgb: 10.9>12.6; plts 225>263. 6/28 Baseline HL 0.77, aPTT 36s  Date Time aPTT/HL Rate/Comment 6/28  1334  74s / --  1400 un/hr 6/28 1955 59s / --  1400 > 1600 un/hr 6/29 0620 102s / 0.84 Thera x1; 1600 un/hr 6/29 1209 75s / --  Thera x2; 1600 un/hr 6/30  0534 70s/0.53  Goal of Therapy:  Heparin level 0.3-0.7 units/ml once aPTT and HL correlates.  aPTT 66-102 seconds Monitor platelets by anticoagulation protocol: Yes   Plan:  Anti-xa/aPTT correlation appears  to be confirmed.  Continue heparin infusion rate at 1600 units/hr Re-check HL with AM labs tomorrow CTM with Daily CBC per protocol while on IV heparin  Otelia Sergeant, PharmD, [facility] 03/03/2021 6:57 AM

## 2021-03-03 NOTE — Progress Notes (Addendum)
Progress Note  Patient Name: Marc [name] Date of Encounter: 03/03/2021  Primary Cardiologist: Debbe Odea, MD  Subjective   Shortness of breath is improving.  Able to lie flat last night. Denies chest pain or palpitations. He says swelling to his abdomen and thighs are back to his baseline.   Inpatient Medications    Scheduled Meds:  aspirin EC  81 mg Oral Daily   atorvastatin  80 mg Oral QPM   carvedilol  25 mg Oral BID   furosemide  80 mg Intravenous BID   insulin aspart  0-15 Units Subcutaneous TID WC   insulin aspart  3 Units Subcutaneous TID WC   insulin glargine  10 Units Subcutaneous Daily   isosorbide-hydrALAZINE  1 tablet Oral TID   [START ON 03/04/2021] losartan  25 mg Oral Daily   potassium chloride  20 mEq Oral BID   sodium chloride flush  3 mL Intravenous Q12H   Continuous Infusions:  sodium chloride     heparin 1,600 Units/hr (03/03/21 0600)   PRN Meds: sodium chloride, acetaminophen, diphenhydrAMINE, hydrOXYzine, naphazoline-pheniramine, ondansetron (ZOFRAN) IV, sodium chloride, sodium chloride flush, traZODone   Vital Signs    Vitals:   03/03/21 0500 03/03/21 0510 03/03/21 0714 03/03/21 1129  BP:  108/60 (!) 157/92 136/82  Pulse:  (!) 44 87 84  Resp:  18 18 18   Temp:   98.4 F (36.9 C) 97.7 F (36.5 C)  TempSrc:   Oral Oral  SpO2:  95% 97% 94%  Weight: 119.5 kg     Height:        Intake/Output Summary (Last 24 hours) at 03/03/2021 1141 Last data filed at 03/03/2021 1129 Gross per 24 hour  Intake 2428.7 ml  Output 4750 ml  Net -2321.3 ml   Filed Weights   03/01/21 1648 03/02/21 0519 03/03/21 0500  Weight: 123 kg 120 kg 119.5 kg    Physical Exam   GEN: Obese, in no acute distress.  HEENT: Grossly normal.  Neck: Supple, moderately elevated JVP. Cardiac: IR,IR, no murmurs, rubs, or gallops. No clubbing or cyanosis. 1-2+ bilat LE woody edema. Radials 2+, DP and PT 1+ and equal bilaterally.  Respiratory:  Respirations regular and  unlabored, clear to auscultation bilaterally. GI: Obese, soft, nontender, nondistended, BS + x 4. MS: no deformity or atrophy. Skin: warm and dry. Neuro:  Strength and sensation are intact. Psych: Normal affect.  Labs    Chemistry Recent Labs  Lab 03/01/21 0351 03/02/21 0620 03/03/21 0534  NA 138 138 136  K 3.1* 3.6 3.3*  CL 103 100 100  CO2 26 28 27   GLUCOSE 167* 166* 263*  BUN 16 20 20   CREATININE 1.11 1.03 1.01  CALCIUM 9.0 9.7 9.3  GFRNONAA >60 >60 >60  ANIONGAP 9 10 9      Hematology Recent Labs  Lab 03/01/21 0351 03/02/21 0620 03/03/21 0534  WBC 10.5 9.0 7.6  RBC 3.44* 3.98* 4.04*  HGB 10.9* 12.6* 12.6*  HCT 31.8* 37.1* 36.9*  MCV 92.4 93.2 91.3  MCH 31.7 31.7 31.2  MCHC 34.3 34.0 34.1  RDW 13.5 13.5 13.5  PLT 225 263 248    Cardiac Enzymes  Recent Labs  Lab 03/01/21 0351 03/01/21 0546 03/01/21 0809 03/01/21 1239 03/01/21 1642  TROPONINIHS 36* 190* 1,214* 3,688* 3,665*      BNP Recent Labs  Lab 03/01/21 0351  BNP 920.7*    Lipids  Lab Results  Component Value Date   CHOL 80 01/07/2021   HDL 26 (  L) 01/07/2021   LDLCALC 40 01/07/2021   TRIG 69 01/07/2021   CHOLHDL 3.1 01/07/2021    HbA1c  Lab Results  Component Value Date   HGBA1C 7.8 (H) 01/07/2021    Radiology    DG Chest Portable 1 View  Result Date: 03/01/2021 CLINICAL DATA:  60 year old male with shortness of breath since 0100 hours. EXAM: PORTABLE CHEST 1 VIEW COMPARISON:  Portable chest 01/06/2021 and earlier. FINDINGS: Portable AP upright view at 0349 hours. Mildly lower lung volumes. Stable cardiomegaly and mediastinal contours. Prior sternotomy. Visualized tracheal air column is within normal limits. Mildly increased interstitial markings in both lungs since last month. No pneumothorax, pleural effusion or consolidation. Chronic un-healed left clavicle fracture. Stable visualized osseous structures. IMPRESSION: Chronic cardiomegaly with lower lung volumes with mildly  increased interstitial markings since last month. Consider mild or developing interstitial edema. Viral/atypical respiratory infection not excluded. Electronically Signed   By: Odessa Fleming M.D.   On: 03/01/2021 04:13   Telemetry    A-fib with PVCs, with 3 beats of NSVTs, 80-90s bpms  - Personally Reviewed  Cardiac Studies   2D Echocardiogram 06.28.2022  1. Left ventricular ejection fraction, by estimation, is 35 to 40%. The  left ventricle has moderately decreased function. Left ventricular  endocardial border not optimally defined to evaluate regional wall motion. There is moderate asymmetric left ventricular hypertrophy of the posterior segment. Left ventricular diastolic function could not be evaluated.   2. Right ventricular systolic function was not well visualized. The right ventricular size is not well visualized. Mildly increased right  ventricular wall thickness.   3. The mitral valve is degenerative. Unable to accurately assess mitral valve regurgitation.   4. The aortic valve is tricuspid. There is mild calcification of the  aortic valve. There is mild thickening of the aortic valve. Aortic valve regurgitation not well assessed. Mild to moderate aortic valve  sclerosis/calcification is present, without any  evidence of aortic stenosis.   2D Echocardiogram 10.19.2021   1. Left ventricular ejection fraction, by estimation, is 40 to 45%. The  left ventricle has mildly decreased function. The left ventricle  demonstrates global hypokinesis. The left ventricular internal cavity size was moderately dilated. There is moderate left ventricular hypertrophy. Left ventricular diastolic parameters are  consistent with Grade I diastolic dysfunction (impaired relaxation).   2. Right ventricular systolic function is moderately reduced. The right ventricular size is normal. Mildly increased right ventricular wall thickness. There is normal pulmonary artery systolic pressure.   3. Left atrial size  was mildly dilated.   4. Right atrial size was mildly dilated.   5. The mitral valve is grossly normal. Trivial mitral valve regurgitation.   6. The aortic valve is tricuspid. There is mild thickening of the aortic valve. Aortic valve regurgitation is not visualized. Mild aortic valve sclerosis is present, with no evidence of aortic valve stenosis.   7. Aortic dilatation noted. There is mild dilatation of the transverse  aorta, measuring 34 mm.   8. The inferior vena cava is normal in size with greater than 50%  respiratory variability, suggesting right atrial pressure of 3 mmHg.  Patient Profile     60 y.o. male with history of CAD s/p 5-vessel CABG in 2015 with LIMA to LAD, sequential SVG to ramus/OM2, and SVG to OM3, chronic combined systolic and diastolic CHF, ICM, persistent a-fib s/p DCCV on 02/23/21, PAD, DM, HTN, HLD, obesity, who was admitted 6/28 w/ acute CHF and recurrent AFib and noted to have NSTEMI  w/ peak HsTrop of 3688.  Assessment & Plan    1. NSTEMI/CAD involving native coronary arteries s/p CABG: - Patient denies chest pain this morning during rounds.  - Continue ASA, statin, ? blocker, and heparin gtt. - Tentative plan for LHC for 7/1.  The patient understands that risks include but are not limited to stroke (1 in 1000), death (1 in 1000), kidney failure [usually temporary] (1 in 500), bleeding (1 in 200), allergic reaction [possibly serious] (1 in 200), and agrees to proceed.    2. Acute on chronic combined systolic and diastolic CHF/ICM:  - Remains volume up, but feeling closer to baseline and able to lie flat. - Neg 2.1L yesterday and minus 5.4L since admission.  Wt down from 123 kg on 6/28 to 119.5 this AM.  Prev dry wt somewhere between 245 and 250 lbs, so still at least 13 lbs away. - Renal fxn remains stable.  I will escalate diuresis to 80 mg BID. - Continue PTA BiDil and Carvedilol - With plan to transition to entresto, I d/c'd lisinopril (received this AM), and  replaced w/ losartan for tomorrow.  We can then transition to entresto for Saturday.   - Considered starting Spironolactone, however pt w/ recent hyperkalemia in the setting of spiro.  I've added spiro to adverse rxn list.  3. Persistent A-fib:  - Patient recently had DCCV on 02/23/2021 with recurrent a.fib noted on admission. - He remains in a.fib with controlled ventricular response - 80's to 90's. - Continue 25 mg Carvedilol BID for rate control  - eliquis on hold in prep for cath. - Following cath, will need to consider initiation of AAD (tikosyn vs amio), along w/ resumption of OAC.  Would prob plan outpt DCCV at some point in 3-4 wks if he doesn't convert.  4. HTN:  - BP remain elevated this morning - Follow w/ escalation of diuretic rx.  As above, transitioning to ARB for tomorrow w/ plan to transition to entresto on Saturday.  Otw, cont ? blocker and hydral/nitrate.  5. HLD:  - Continue home dose of 80 mg atorvastatin.  LDL 40.  6. DM2:  - HA1C: 7.8  - SSI per IM.   7. Hypokalemia:  - K: 3.3 - Started on KCl 20 mEq BID. Considered adding spiro but prev hyperK+ in May.   Signed, Nicolasa Ducking, NP  03/03/2021, 11:41 AM    For questions or updates, please contact   Please consult [email or web address] for contact info under Cardiology/STEMI.    Attending Note Patient seen and examined, agree with detailed note above,   Patient presentation and plan discussed on rounds.    EKG lab work, chest x-ray, echocardiogram reviewed independently by myself  Sitting up at the side of bed eating on rounds  reports still having significant leg edema abdominal distention Shortness of breath improving  On examination : Morbidly obese alert oriented, no JVD, lungs clear to auscultation bilaterally, heart sounds regular normal S1-S2 no murmurs appreciated, abdomen soft nontender no significant lower extremity edema.  Musculoskeletal exam with good range of motion, neurologic exam grossly  nonfocal  Lab work reviewed sodium 136 potassium 3.3 creatinine 1.0 BUN 20 WBC 7.6 hematocrit 36  A/P: Non-STEMI Known coronary disease, history of CABG Presenting with shortness of breath, worsening CHF symptoms leg edema, Cardiomyopathy ejection fraction 35 to 40% -Discussed work-up with him, plan for cardiac catheterization tomorrow I have reviewed the risks, indications, and alternatives to cardiac catheterization, possible angioplasty, and stenting with the patient.  Risks include but are not limited to bleeding, infection, vascular injury, stroke, myocardial infection, arrhythmia, kidney injury, radiation-related injury in the case of prolonged fluoroscopy use, emergency cardiac surgery, and death. The patient understands the risks of serious complication is 1-2 in 1000 with diagnostic cardiac cath and 1-2% or less with angioplasty/stenting.  -He is willing to proceed, scheduled for tomorrow morning  Paroxysmal atrial fibrillation Cardioversion February 23, 2021, recurrent atrial fibrillation Currently on carvedilol 25 twice daily for rate control Consider antiarrhythmic/amiodarone versus Tikosyn following cardiac catheterization Currently on heparin infusion/bridge  Long discussion concerning risk and benefit of cardiac catheterization, he is willing to proceed Discussed management of his atrial fibrillation Greater than 50% was spent in counseling and coordination of care with patient Total encounter time 35 minutes or more   Signed: Tim Cayle Thunder  M.D., Ph.D. [facility]  

## 2021-03-03 NOTE — Progress Notes (Signed)
PROGRESS NOTE    Marc [name]  DZH:[id number] DOB: [birth year] DOA: 03/01/2021 PCP: [facility]   Chief complaint.  Shortness of breath. Brief Narrative:  Marc [name] is a 60 y.o. male with medical history significant for atrial fibrillation status post recent DC cardioversion, history of chronic systolic CHF last known LVEF of 40 to 45%, history of coronary artery disease status post CABG x4, hypertension, diabetes mellitus and dyslipidemia who presents to the emergency room for evaluation of shortness of breath and increased weight gain.  He is diagnosed with acute on chronic systolic congestive heart failure, he was started on IV Lasix.  He was seen by cardiology, peak troponin was 3688, planning heart cath.   Assessment & Plan:   Principal Problem:   Acute on chronic systolic CHF (congestive heart failure) (HCC) Active Problems:   Hypertension   CAD (coronary artery disease)   Type 2 diabetes mellitus without complication (HCC)   Atrial fibrillation (HCC)   Anxiety   Hypokalemia   NSTEMI (non-ST elevated myocardial infarction) (HCC)  #1.  Acute on chronic systolic congestive heart failure. Non-STEMI. Patient condition improving, patient was able to sleep flat last night. Echocardiogram reviewed ejection fraction 35 to 40%.  Patient is a followed by cardiology, diuresis will be continued. Heart catheterization will be decided by cardiology.  #2 paroxysmal atrial fibrillation. Status post recent cardioversion. Continue heparin, Eliquis on hold.  3.  Type 2 diabetes uncontrolled with hyperglycemia. Glucose running high, add Lantus and scheduled NovoLog in addition to sliding scale insulin.   DVT prophylaxis: Heparin Code Status: full Family Communication:  Disposition Plan:    Status is: Inpatient  Remains inpatient appropriate because:Inpatient level of care appropriate due to severity of illness  Dispo: The patient is from: Home               Anticipated d/c is to: Home              Patient currently is not medically stable to d/c.   Difficult to place patient No        I/O last 3 completed shifts: In: 2852.5 [P.O.:2480; I.V.:372.5] Out: 6590 [Urine:6590] Total I/O In: 240 [P.O.:240] Out: 220 [Urine:220]     Consultants:  Cardiology  Procedures: None  Antimicrobials: None   Subjective: Patient feels much improved today, he was able to sleep flat overnight.  Short of breath is better.  Still has some leg edema. Denies any abdominal pain or nausea vomiting No dysuria hematuria No fever or chills.  Objective: Vitals:   03/03/21 0411 03/03/21 0500 03/03/21 0510 03/03/21 0714  BP: (!) 156/92  108/60 (!) 157/92  Pulse:   (!) 44 87  Resp:   18 18  Temp: 97.9 F (36.6 C)   98.4 F (36.9 C)  TempSrc: Oral   Oral  SpO2: 97%  95% 97%  Weight:  119.5 kg    Height:        Intake/Output Summary (Last 24 hours) at 03/03/2021 1028 Last data filed at 03/03/2021 0950 Gross per 24 hour  Intake 2188.7 ml  Output 4670 ml  Net -2481.3 ml   Filed Weights   03/01/21 1648 03/02/21 0519 03/03/21 0500  Weight: 123 kg 120 kg 119.5 kg    Examination:  General exam: Appears calm and comfortable  Respiratory system: Clear to auscultation. Respiratory effort normal. Cardiovascular system: S1 & S2 heard, RRR. No JVD, murmurs, rubs, gallops or clicks.  Gastrointestinal system: Abdomen is nondistended, soft and  nontender. No organomegaly or masses felt. Normal bowel sounds heard. Central nervous system: Alert and oriented. No focal neurological deficits. Extremities: 2+ leg edema Skin: No rashes, lesions or ulcers Psychiatry: Judgement and insight appear normal. Mood & affect appropriate.     Data Reviewed: I have personally reviewed following labs and imaging studies  CBC: Recent Labs  Lab 03/01/21 0351 03/02/21 0620 03/03/21 0534  WBC 10.5 9.0 7.6  NEUTROABS 8.7*  --   --   HGB 10.9* 12.6* 12.6*  HCT  31.8* 37.1* 36.9*  MCV 92.4 93.2 91.3  PLT 225 263 248   Basic Metabolic Panel: Recent Labs  Lab 03/01/21 0351 03/02/21 0620 03/03/21 0534  NA 138 138 136  K 3.1* 3.6 3.3*  CL 103 100 100  CO2 26 28 27   GLUCOSE 167* 166* 263*  BUN 16 20 20   CREATININE 1.11 1.03 1.01  CALCIUM 9.0 9.7 9.3  MG 1.8  --  1.9   GFR: Estimated Creatinine Clearance: 104 mL/min (by C-G formula based on SCr of 1.01 mg/dL). Liver Function Tests: No results for input(s): AST, ALT, ALKPHOS, BILITOT, PROT, ALBUMIN in the last 168 hours. No results for input(s): LIPASE, AMYLASE in the last 168 hours. No results for input(s): AMMONIA in the last 168 hours. Coagulation Profile: No results for input(s): INR, PROTIME in the last 168 hours. Cardiac Enzymes: No results for input(s): CKTOTAL, CKMB, CKMBINDEX, TROPONINI in the last 168 hours. BNP (last 3 results) No results for input(s): PROBNP in the last 8760 hours. HbA1C: No results for input(s): HGBA1C in the last 72 hours. CBG: Recent Labs  Lab 03/02/21 0722 03/02/21 1131 03/02/21 1647 03/02/21 2122 03/03/21 0714  GLUCAP 189* 304* 191* 263* 275*   Lipid Profile: No results for input(s): CHOL, HDL, LDLCALC, TRIG, CHOLHDL, LDLDIRECT in the last 72 hours. Thyroid Function Tests: No results for input(s): TSH, T4TOTAL, FREET4, T3FREE, THYROIDAB in the last 72 hours. Anemia Panel: No results for input(s): VITAMINB12, FOLATE, FERRITIN, TIBC, IRON, RETICCTPCT in the last 72 hours. Sepsis Labs: No results for input(s): PROCALCITON, LATICACIDVEN in the last 168 hours.  Recent Results (from the past 240 hour(s))  Resp Panel by RT-PCR (Flu A&B, Covid)     Status: None   Collection Time: 03/01/21  4:29 AM  Result Value Ref Range Status   SARS Coronavirus 2 by RT PCR NEGATIVE NEGATIVE Final    Comment: (NOTE) SARS-CoV-2 target nucleic acids are NOT DETECTED.  The SARS-CoV-2 RNA is generally detectable in upper respiratory specimens during the acute  phase of infection. The lowest concentration of SARS-CoV-2 viral copies this assay can detect is 138 copies/mL. A negative result does not preclude SARS-Cov-2 infection and should not be used as the sole basis for treatment or other patient management decisions. A negative result may occur with  improper specimen collection/handling, submission of specimen other than nasopharyngeal swab, presence of viral mutation(s) within the areas targeted by this assay, and inadequate number of viral copies(<138 copies/mL). A negative result must be combined with clinical observations, patient history, and epidemiological information. The expected result is Negative.  Fact Sheet for Patients:  03/05/21  Fact Sheet for Healthcare Providers:  03/03/21  This test is no t yet approved or cleared by the [employer] FDA and  has been authorized for detection and/or diagnosis of SARS-CoV-2 by FDA under an Emergency Use Authorization (EUA). This EUA will remain  in effect (meaning this test can be used) for the duration of the COVID-19 declaration under  Section 564(b)(1) of the Act, 21 U.S.C.section 360bbb-3(b)(1), unless the authorization is terminated  or revoked sooner.       Influenza A by PCR NEGATIVE NEGATIVE Final   Influenza B by PCR NEGATIVE NEGATIVE Final    Comment: (NOTE) The Xpert Xpress SARS-CoV-2/FLU/RSV plus assay is intended as an aid in the diagnosis of influenza from Nasopharyngeal swab specimens and should not be used as a sole basis for treatment. Nasal washings and aspirates are unacceptable for Xpert Xpress SARS-CoV-2/FLU/RSV testing.  Fact Sheet for Patients: BloggerCourse.comhttps://www.fda.gov/media/152166/download  Fact Sheet for Healthcare Providers: SeriousBroker.ithttps://www.fda.gov/media/152162/download  This test is not yet approved or cleared by the [place] FDA and has been authorized for detection and/or diagnosis of  SARS-CoV-2 by FDA under an Emergency Use Authorization (EUA). This EUA will remain in effect (meaning this test can be used) for the duration of the COVID-19 declaration under Section 564(b)(1) of the Act, 21 U.S.C. section 360bbb-3(b)(1), unless the authorization is terminated or revoked.  Performed at [facility], [address] [place], [place] [address]          Radiology Studies: ECHOCARDIOGRAM COMPLETE  Result Date: 03/01/2021    ECHOCARDIOGRAM REPORT   Patient Name:   Marc [name] Date of Exam: 03/01/2021 Medical Rec #:  [id number]        Height:       73.0 in Accession #:    [id number]       Weight:       260.0 lb Date of Birth:  01/06/1961         BSA:          2.405 m Patient Age:    59 years         BP:           142/96 mmHg Patient Gender: M                HR:           73 bpm. Exam Location:  [facility] Procedure: 2D Echo, Cardiac Doppler and Color Doppler Indications:     CHF-acute systolic I50.21  History:         Patient has prior history of Echocardiogram examinations, most                  recent 06/22/2020. Previous Myocardial Infarction; Risk                  Factors:Diabetes. Ischemic cardiomyopathy.  Sonographer:     Cristela BlueJerry Hege RDCS (AE) Referring Phys:  [id number] Elwyn LadeCHUKWU AGBATA Diagnosing Phys: Cristal Deerhristopher End MD  Sonographer Comments: Technically challenging study due to limited acoustic windows, no apical window and no subcostal window. IMPRESSIONS  1. Left ventricular ejection fraction, by estimation, is 35 to 40%. The left ventricle has moderately decreased function. Left ventricular endocardial border not optimally defined to evaluate regional wall motion. There is moderate asymmetric left ventricular hypertrophy of the posterior segment. Left ventricular diastolic function could not be evaluated.  2. Right ventricular systolic function was not well visualized. The right ventricular size is not well visualized. Mildly increased right ventricular wall thickness.  3.  The mitral valve is degenerative. Unable to accurately assess mitral valve regurgitation.  4. The aortic valve is tricuspid. There is mild calcification of the aortic valve. There is mild thickening of the aortic valve. Aortic valve regurgitation not well assessed. Mild to moderate aortic valve sclerosis/calcification is present, without any evidence of aortic stenosis. FINDINGS  Left Ventricle: Left  ventricular ejection fraction, by estimation, is 35 to 40%. The left ventricle has moderately decreased function. Left ventricular endocardial border not optimally defined to evaluate regional wall motion. The left ventricular internal cavity size was normal in size. There is moderate asymmetric left ventricular hypertrophy of the posterior segment. Left ventricular diastolic function could not be evaluated. Right Ventricle: The right ventricular size is not well visualized. Mildly increased right ventricular wall thickness. Right ventricular systolic function was not well visualized. Left Atrium: Left atrial size was not well visualized. Right Atrium: Right atrial size was not well visualized. Pericardium: The pericardium was not well visualized. Mitral Valve: The mitral valve is degenerative in appearance. There is mild thickening of the mitral valve leaflet(s). There is mild calcification of the mitral valve leaflet(s). Mild mitral annular calcification. Unable to accurately assess mitral valve  regurgitation. Tricuspid Valve: The tricuspid valve is grossly normal. Tricuspid valve regurgitation is mild. Aortic Valve: The aortic valve is tricuspid. There is mild calcification of the aortic valve. There is mild thickening of the aortic valve. Aortic valve regurgitation not well assessed. Mild to moderate aortic valve sclerosis/calcification is present, without any evidence of aortic stenosis. Pulmonic Valve: The pulmonic valve was not well visualized. Pulmonic valve regurgitation is not visualized. No evidence of  pulmonic stenosis. Aorta: The aortic root is normal in size and structure. Pulmonary Artery: The pulmonary artery is of normal size. IAS/Shunts: The interatrial septum was not well visualized.  LEFT VENTRICLE PLAX 2D LVIDd:         5.35 cm LVIDs:         3.82 cm LV PW:         1.65 cm LV IVS:        1.15 cm LVOT diam:     2.10 cm LVOT Area:     3.46 cm  LEFT ATRIUM         Index LA diam:    6.20 cm 2.58 cm/m                        PULMONIC VALVE AORTA                 PV Vmax:        0.77 m/s Ao Root diam: 3.47 cm PV Peak grad:   2.4 mmHg                       RVOT Peak grad: 4 mmHg  TRICUSPID VALVE TR Peak grad:   32.7 mmHg TR Vmax:        286.00 cm/s  SHUNTS Systemic Diam: 2.10 cm Yvonne Kendall MD Electronically signed by Yvonne Kendall MD Signature Date/Time: 03/01/2021/4:57:23 PM    Final         Scheduled Meds:  aspirin EC  81 mg Oral Daily   atorvastatin  80 mg Oral QPM   carvedilol  25 mg Oral BID   furosemide  40 mg Intravenous Q12H   insulin aspart  0-15 Units Subcutaneous TID WC   insulin aspart  3 Units Subcutaneous TID WC   insulin glargine  10 Units Subcutaneous Daily   isosorbide-hydrALAZINE  1 tablet Oral TID   lisinopril  5 mg Oral Daily   potassium chloride  20 mEq Oral Once   sodium chloride flush  3 mL Intravenous Q12H   Continuous Infusions:  sodium chloride     heparin 1,600 Units/hr (03/03/21 0600)     LOS: 2  days    Time spent: 28 minutes    Marrion Coy, MD [facility]   To contact the attending provider between 7A-7P or the covering provider during after hours 7P-7A, please log into the web site [email or web address] and access using universal [place] password for that web site. If you do not have the password, please call the hospital operator.  03/03/2021, 10:28 AM

## 2021-03-04 ENCOUNTER — Encounter
Admission: EM | Disposition: A | Discharge status: Home Health | Payer: Self-pay | Source: Home / Self Care | Attending: Internal Medicine

## 2021-03-04 ENCOUNTER — Encounter: Payer: Self-pay | Admitting: Internal Medicine

## 2021-03-04 DIAGNOSIS — I25810 Atherosclerosis of coronary artery bypass graft(s) without angina pectoris: Secondary | ICD-10-CM

## 2021-03-04 DIAGNOSIS — I2510 Atherosclerotic heart disease of native coronary artery without angina pectoris: Secondary | ICD-10-CM

## 2021-03-04 HISTORY — PX: RIGHT/LEFT HEART CATH AND CORONARY/GRAFT ANGIOGRAPHY: CATH118267

## 2021-03-04 LAB — CBC
HCT: 36.2 % — ABNORMAL LOW (ref 39.0–52.0)
Hemoglobin: 12.7 g/dL — ABNORMAL LOW (ref 13.0–17.0)
MCH: 32.1 pg (ref 26.0–34.0)
MCHC: 35.1 g/dL (ref 30.0–36.0)
MCV: 91.4 fL (ref 80.0–100.0)
Platelets: 235 10*3/uL (ref 150–400)
RBC: 3.96 MIL/uL — ABNORMAL LOW (ref 4.22–5.81)
RDW: 13.5 % (ref 11.5–15.5)
WBC: 8.4 10*3/uL (ref 4.0–10.5)
nRBC: 0 % (ref 0.0–0.2)

## 2021-03-04 LAB — GLUCOSE, CAPILLARY
Glucose-Capillary: 325 mg/dL — ABNORMAL HIGH (ref 70–99)
Glucose-Capillary: 226 mg/dL — ABNORMAL HIGH (ref 70–99)
Glucose-Capillary: 421 mg/dL — ABNORMAL HIGH (ref 70–99)
Glucose-Capillary: 400 mg/dL — ABNORMAL HIGH (ref 70–99)

## 2021-03-04 LAB — BASIC METABOLIC PANEL
Anion gap: 8 (ref 5–15)
BUN: 19 mg/dL (ref 8–23)
CO2: 27 mmol/L (ref 22–32)
Calcium: 9.5 mg/dL (ref 8.9–10.3)
Chloride: 100 mmol/L (ref 98–111)
Creatinine, Ser: 1.11 mg/dL (ref 0.61–1.24)
GFR, Estimated: >60 mL/min (ref >60)
Glucose, Bld: 296 mg/dL — ABNORMAL HIGH (ref 70–99)
Potassium: 3.7 mmol/L (ref 3.5–5.1)
Sodium: 135 mmol/L (ref 135–145)

## 2021-03-04 LAB — HEPARIN LEVEL (UNFRACTIONATED): Heparin Unfractionated: 0.51 IU/mL (ref 0.30–0.70)

## 2021-03-04 LAB — MAGNESIUM: Magnesium: 1.7 mg/dL (ref 1.7–2.4)

## 2021-03-04 SURGERY — RIGHT/LEFT HEART CATH AND CORONARY/GRAFT ANGIOGRAPHY
Anesthesia: Moderate Sedation

## 2021-03-04 MED ORDER — HEPARIN (PORCINE) IN NACL 1000-0.9 UT/500ML-% IV SOLN
INTRAVENOUS | Status: AC
  Filled 2021-03-04: qty 1000

## 2021-03-04 MED ORDER — SODIUM CHLORIDE 0.9 % IV SOLN: INTRAVENOUS | Status: DC

## 2021-03-04 MED ORDER — SODIUM CHLORIDE 0.9 % IV SOLN: 250.0000 mL | INTRAVENOUS | Status: DC | PRN

## 2021-03-04 MED ORDER — INSULIN GLARGINE 100 UNIT/ML ~~LOC~~ SOLN
25.0000 [IU] | Freq: Every day | SUBCUTANEOUS | Status: DC
  Filled 2021-03-04: qty 0.25

## 2021-03-04 MED ORDER — FENTANYL CITRATE (PF) 100 MCG/2ML IJ SOLN
INTRAMUSCULAR | Status: AC
  Filled 2021-03-04: qty 2

## 2021-03-04 MED ORDER — LIDOCAINE HCL (PF) 1 % IJ SOLN
INTRAMUSCULAR | Status: AC
  Filled 2021-03-04: qty 30

## 2021-03-04 MED ORDER — MIDAZOLAM HCL 2 MG/2ML IJ SOLN
INTRAMUSCULAR | Status: DC | PRN
  Administered 2021-03-04: 1 mg via INTRAVENOUS

## 2021-03-04 MED ORDER — IOHEXOL 300 MG/ML  SOLN
INTRAMUSCULAR | Status: DC | PRN
  Administered 2021-03-04: 79.5 mL

## 2021-03-04 MED ORDER — FUROSEMIDE 10 MG/ML IJ SOLN
40.0000 mg | Freq: Two times a day (BID) | INTRAMUSCULAR | Status: DC
  Administered 2021-03-04 – 2021-03-06 (×4): 40 mg via INTRAVENOUS
  Filled 2021-03-04 (×4): qty 4

## 2021-03-04 MED ORDER — HEPARIN SODIUM (PORCINE) 1000 UNIT/ML IJ SOLN
INTRAMUSCULAR | Status: DC | PRN
  Administered 2021-03-04: 6000 [IU] via INTRAVENOUS

## 2021-03-04 MED ORDER — SODIUM CHLORIDE 0.9% FLUSH: 3.0000 mL | INTRAVENOUS | Status: DC | PRN

## 2021-03-04 MED ORDER — MIDAZOLAM HCL 2 MG/2ML IJ SOLN
INTRAMUSCULAR | Status: AC
  Filled 2021-03-04: qty 2

## 2021-03-04 MED ORDER — VERAPAMIL HCL 2.5 MG/ML IV SOLN
INTRAVENOUS | Status: DC | PRN
  Administered 2021-03-04: 2.5 mg via INTRAVENOUS

## 2021-03-04 MED ORDER — VERAPAMIL HCL 2.5 MG/ML IV SOLN
INTRAVENOUS | Status: AC
  Filled 2021-03-04: qty 2

## 2021-03-04 MED ORDER — LIDOCAINE HCL (PF) 1 % IJ SOLN
INTRAMUSCULAR | Status: DC | PRN
  Administered 2021-03-04: 6 mL

## 2021-03-04 MED ORDER — HEPARIN SODIUM (PORCINE) 1000 UNIT/ML IJ SOLN
INTRAMUSCULAR | Status: AC
  Filled 2021-03-04: qty 1

## 2021-03-04 MED ORDER — INSULIN ASPART 100 UNIT/ML IJ SOLN
0.0000 [IU] | Freq: Three times a day (TID) | INTRAMUSCULAR | Status: DC
  Administered 2021-03-04 – 2021-03-05 (×2): 15 [IU] via SUBCUTANEOUS
  Administered 2021-03-05: 11 [IU] via SUBCUTANEOUS
  Administered 2021-03-05: 8 [IU] via SUBCUTANEOUS
  Administered 2021-03-05: 3 [IU] via SUBCUTANEOUS
  Administered 2021-03-06: 15 [IU] via SUBCUTANEOUS
  Administered 2021-03-06 (×2): 3 [IU] via SUBCUTANEOUS
  Administered 2021-03-06 – 2021-03-07 (×2): 11 [IU] via SUBCUTANEOUS
  Administered 2021-03-07: 8 [IU] via SUBCUTANEOUS
  Administered 2021-03-07: 5 [IU] via SUBCUTANEOUS
  Administered 2021-03-07: 8 [IU] via SUBCUTANEOUS
  Administered 2021-03-08: 10 [IU] via SUBCUTANEOUS
  Administered 2021-03-08: 8 [IU] via SUBCUTANEOUS
  Filled 2021-03-04 (×15): qty 1

## 2021-03-04 MED ORDER — SODIUM CHLORIDE 0.9% FLUSH
3.0000 mL | Freq: Two times a day (BID) | INTRAVENOUS | Status: DC
  Administered 2021-03-04 – 2021-03-08 (×6): 3 mL via INTRAVENOUS

## 2021-03-04 MED ORDER — HEPARIN (PORCINE) IN NACL 2000-0.9 UNIT/L-% IV SOLN
INTRAVENOUS | Status: DC | PRN
Start: 2021-03-04 — End: 2021-03-04
  Administered 2021-03-04: 1000 mL

## 2021-03-04 MED ORDER — SODIUM CHLORIDE 0.9% FLUSH: 3.0000 mL | Freq: Two times a day (BID) | INTRAVENOUS | Status: DC

## 2021-03-04 MED ORDER — FENTANYL CITRATE (PF) 100 MCG/2ML IJ SOLN
INTRAMUSCULAR | Status: DC | PRN
  Administered 2021-03-04: 25 ug via INTRAVENOUS

## 2021-03-04 MED ORDER — ASPIRIN 81 MG PO CHEW: 81.0000 mg | CHEWABLE_TABLET | ORAL | Status: DC

## 2021-03-04 MED ORDER — HEPARIN (PORCINE) 25000 UT/250ML-% IV SOLN
1600.0000 [IU]/h | INTRAVENOUS | Status: AC
  Administered 2021-03-04 – 2021-03-05 (×3): 1600 [IU]/h via INTRAVENOUS
  Filled 2021-03-04 (×2): qty 250

## 2021-03-04 MED ORDER — INSULIN GLARGINE 100 UNIT/ML ~~LOC~~ SOLN
10.0000 [IU] | Freq: Once | SUBCUTANEOUS | Status: AC
  Administered 2021-03-04: 10 [IU] via SUBCUTANEOUS
  Filled 2021-03-04: qty 0.1

## 2021-03-04 SURGICAL SUPPLY — 15 items
CATH 5F 110X4 TIG (CATHETERS) ×2 IMPLANT
CATH BALLN WEDGE 5F 110CM (CATHETERS) ×2 IMPLANT
CATH INFINITI 5 FR IM (CATHETERS) ×2 IMPLANT
CATH INFINITI 5 FR LCB (CATHETERS) ×2 IMPLANT
DEVICE RAD TR BAND REGULAR (VASCULAR PRODUCTS) ×2 IMPLANT
DRAPE BRACHIAL (DRAPES) ×4 IMPLANT
GLIDESHEATH SLEND SS 6F .021 (SHEATH) ×2 IMPLANT
GUIDEWIRE .025 260CM (WIRE) ×2 IMPLANT
GUIDEWIRE INQWIRE 1.5J.035X260 (WIRE) ×1 IMPLANT
INQWIRE 1.5J .035X260CM (WIRE) ×2
PACK CARDIAC CATH (CUSTOM PROCEDURE TRAY) ×2 IMPLANT
PROTECTION STATION PRESSURIZED (MISCELLANEOUS) ×2
SET ATX SIMPLICITY (MISCELLANEOUS) ×2 IMPLANT
SHEATH GLIDE SLENDER 4/5FR (SHEATH) ×2 IMPLANT
STATION PROTECTION PRESSURIZED (MISCELLANEOUS) ×1 IMPLANT

## 2021-03-04 NOTE — Progress Notes (Addendum)
PROGRESS NOTE    Marc [name]  OEU:[id number] DOB: [birth year] DOA: 03/01/2021 PCP: [facility]   Chief complaint.  Shortness of breath. Brief Narrative:  Marc [name] is a 60 y.o. male with medical history significant for atrial fibrillation status post recent DC cardioversion, history of chronic systolic CHF last known LVEF of 40 to 45%, history of coronary artery disease status post CABG x4, hypertension, diabetes mellitus and dyslipidemia who presents to the emergency room for evaluation of shortness of breath and increased weight gain.  He is diagnosed with acute on chronic systolic congestive heart failure, he was started on IV Lasix.  He was seen by cardiology, peak troponin was 3688, heart cath on 7/1 showed multivessel disease.  Cardiology decided against further intervention, patient will be treated medically.     Assessment & Plan:   Principal Problem:   Acute on chronic systolic CHF (congestive heart failure) (HCC) Active Problems:   Hypertension   CAD (coronary artery disease)   Type 2 diabetes mellitus without complication (HCC)   Atrial fibrillation (HCC)   Anxiety   Hypokalemia   NSTEMI (non-ST elevated myocardial infarction) (HCC)  #1.  Acute on chronic systolic congestive heart failure. Non-STEMI. Heart cath showed multivessel disease.  Patient still volume overloaded, continue IV Lasix. Recheck a BMP and magnesium tomorrow.  2.  Paroxysmal atrial fibrillation. Status post cardioversion. Heparin drip continued by cardiology.  #3.  Type 2 diabetes with hyperglycemia. Continue scheduled NovoLog and sliding scale insulin.  Increase Lantus to 25 units daily.   DVT prophylaxis: Heparin Code Status: full Family Communication:  Disposition Plan:    Status is: Inpatient  Remains inpatient appropriate because:Inpatient level of care appropriate due to severity of illness  Dispo: The patient is from: Home              Anticipated d/c  is to: Home              Patient currently is not medically stable to d/c.   Difficult to place patient No        I/O last 3 completed shifts: In: 960 [P.O.:960] Out: 8230 [Urine:8230] Total I/O In: -  Out: 300 [Urine:300]     Consultants:  Cardiology  Procedures: heart cath  Antimicrobials: none  Subjective: Patient feels better this morning, slept relatively flat last night.  Short of breath better. No abdominal pain nausea vomiting. No dysuria hematuria pain No fever or chills.  Objective: Vitals:   03/04/21 1215 03/04/21 1230 03/04/21 1245 03/04/21 1300  BP: 136/88 (!) 138/95 (!) 136/96 (!) 149/91  Pulse: 79 82 76 78  Resp: 20 17 (!) 22 20  Temp:      TempSrc:      SpO2: 96% 97% 98% 97%  Weight:      Height:        Intake/Output Summary (Last 24 hours) at 03/04/2021 1411 Last data filed at 03/04/2021 0906 Gross per 24 hour  Intake 240 ml  Output 4180 ml  Net -3940 ml   Filed Weights   03/02/21 0519 03/03/21 0500 03/04/21 0058  Weight: 120 kg 119.5 kg 117.9 kg    Examination:  General exam: Appears calm and comfortable  Respiratory system: Decreased breathing sounds. Respiratory effort normal. Cardiovascular system: Irregular. No JVD, murmurs, rubs, gallops or clicks.  Gastrointestinal system: Abdomen is nondistended, soft and nontender. No organomegaly or masses felt. Normal bowel sounds heard. Central nervous system: Alert and oriented. No focal neurological deficits. Extremities: 1+  leg edema Skin: No rashes, lesions or ulcers Psychiatry: Judgement and insight appear normal. Mood & affect appropriate.     Data Reviewed: I have personally reviewed following labs and imaging studies  CBC: Recent Labs  Lab 03/01/21 0351 03/02/21 0620 03/03/21 0534 03/04/21 0549  WBC 10.5 9.0 7.6 8.4  NEUTROABS 8.7*  --   --   --   HGB 10.9* 12.6* 12.6* 12.7*  HCT 31.8* 37.1* 36.9* 36.2*  MCV 92.4 93.2 91.3 91.4  PLT 225 263 248 235   Basic Metabolic  Panel: Recent Labs  Lab 03/01/21 0351 03/02/21 0620 03/03/21 0534 03/04/21 0549  NA 138 138 136 135  K 3.1* 3.6 3.3* 3.7  CL 103 100 100 100  CO2 26 28 27 27   GLUCOSE 167* 166* 263* 296*  BUN 16 20 20 19   CREATININE 1.11 1.03 1.01 1.11  CALCIUM 9.0 9.7 9.3 9.5  MG 1.8  --  1.9 1.7   GFR: Estimated Creatinine Clearance: 94 mL/min (by C-G formula based on SCr of 1.11 mg/dL). Liver Function Tests: No results for input(s): AST, ALT, ALKPHOS, BILITOT, PROT, ALBUMIN in the last 168 hours. No results for input(s): LIPASE, AMYLASE in the last 168 hours. No results for input(s): AMMONIA in the last 168 hours. Coagulation Profile: No results for input(s): INR, PROTIME in the last 168 hours. Cardiac Enzymes: No results for input(s): CKTOTAL, CKMB, CKMBINDEX, TROPONINI in the last 168 hours. BNP (last 3 results) No results for input(s): PROBNP in the last 8760 hours. HbA1C: No results for input(s): HGBA1C in the last 72 hours. CBG: Recent Labs  Lab 03/03/21 1128 03/03/21 1557 03/03/21 2129 03/04/21 0751 03/04/21 1147  GLUCAP 259* 282* 312* 325* 226*   Lipid Profile: No results for input(s): CHOL, HDL, LDLCALC, TRIG, CHOLHDL, LDLDIRECT in the last 72 hours. Thyroid Function Tests: No results for input(s): TSH, T4TOTAL, FREET4, T3FREE, THYROIDAB in the last 72 hours. Anemia Panel: No results for input(s): VITAMINB12, FOLATE, FERRITIN, TIBC, IRON, RETICCTPCT in the last 72 hours. Sepsis Labs: No results for input(s): PROCALCITON, LATICACIDVEN in the last 168 hours.  Recent Results (from the past 240 hour(s))  Resp Panel by RT-PCR (Flu A&B, Covid)     Status: None   Collection Time: 03/01/21  4:29 AM  Result Value Ref Range Status   SARS Coronavirus 2 by RT PCR NEGATIVE NEGATIVE Final    Comment: (NOTE) SARS-CoV-2 target nucleic acids are NOT DETECTED.  The SARS-CoV-2 RNA is generally detectable in upper respiratory specimens during the acute phase of infection. The  lowest concentration of SARS-CoV-2 viral copies this assay can detect is 138 copies/mL. A negative result does not preclude SARS-Cov-2 infection and should not be used as the sole basis for treatment or other patient management decisions. A negative result may occur with  improper specimen collection/handling, submission of specimen other than nasopharyngeal swab, presence of viral mutation(s) within the areas targeted by this assay, and inadequate number of viral copies(<138 copies/mL). A negative result must be combined with clinical observations, patient history, and epidemiological information. The expected result is Negative.  Fact Sheet for Patients:  05/05/21  Fact Sheet for Healthcare Providers:  03/03/21  This test is no t yet approved or cleared by the [employer] FDA and  has been authorized for detection and/or diagnosis of SARS-CoV-2 by FDA under an Emergency Use Authorization (EUA). This EUA will remain  in effect (meaning this test can be used) for the duration of the COVID-19 declaration under Section  564(b)(1) of the Act, 21 U.S.C.section 360bbb-3(b)(1), unless the authorization is terminated  or revoked sooner.       Influenza A by PCR NEGATIVE NEGATIVE Final   Influenza B by PCR NEGATIVE NEGATIVE Final    Comment: (NOTE) The Xpert Xpress SARS-CoV-2/FLU/RSV plus assay is intended as an aid in the diagnosis of influenza from Nasopharyngeal swab specimens and should not be used as a sole basis for treatment. Nasal washings and aspirates are unacceptable for Xpert Xpress SARS-CoV-2/FLU/RSV testing.  Fact Sheet for Patients: BloggerCourse.com  Fact Sheet for Healthcare Providers: SeriousBroker.it  This test is not yet approved or cleared by the [place] FDA and has been authorized for detection and/or diagnosis of SARS-CoV-2 by FDA under  an Emergency Use Authorization (EUA). This EUA will remain in effect (meaning this test can be used) for the duration of the COVID-19 declaration under Section 564(b)(1) of the Act, 21 U.S.C. section 360bbb-3(b)(1), unless the authorization is terminated or revoked.  Performed at [facility], [address], [place], [place] [address]          Radiology Studies: CARDIAC CATHETERIZATION  Result Date: 03/04/2021  Ost RCA to Prox RCA lesion is 100% stenosed.  Mid LM to Dist LM lesion is 90% stenosed.  Ost LAD to Prox LAD lesion is 100% stenosed.  Ost Cx to Prox Cx lesion is 50% stenosed.  Mid Cx to Dist Cx lesion is 60% stenosed.  3rd Mrg lesion is 90% stenosed.  1st LPL lesion is 90% stenosed.  2nd LPL lesion is 90% stenosed.  SVG graft was visualized by angiography.  Prox Graft to Mid Graft lesion is 30% stenosed.  Origin to Prox Graft lesion is 100% stenosed.  LIMA graft was visualized by angiography and is normal in caliber.  The graft exhibits no disease.  Dist LAD lesion is 60% stenosed.  Mid LAD lesion is 70% stenosed.  1.  Severe underlying left main and three-vessel coronary artery disease with patent LIMA to LAD and patent SVG to OM2/ramus.  Occluded SVG to OM 3.  The RCA is known to be small and nondominant and occluded proximally with some left-to-right collaterals. 2.  Left ventricular angiography was not performed.  EF was moderately reduced by echo. 3.  Right heart catheterization showed mildly elevated wedge pressure at 20 mmHg with a prominent V wave, mild pulmonary hypertension and moderately reduced cardiac output. Recommendations: Recommend continuing medical therapy for coronary artery disease.  No good options to revascularize the OM 3 and LPL distributions given the need to stent the left main, mid left circumflex and 3 branches of the left circumflex.  Risks probably outweigh the benefit. The best option is to treat medically including optimizing heart  failure and rhythm control. The patient is still volume overloaded.  Continue IV diuresis for another day and likely switch to oral diuretic tomorrow. Resume heparin drip in 6 hours and can switch to Eliquis tomorrow morning if no bleeding issues from the cath site.        Scheduled Meds:  aspirin EC  81 mg Oral Daily   atorvastatin  80 mg Oral QPM   carvedilol  25 mg Oral BID   furosemide  40 mg Intravenous BID   insulin aspart  0-15 Units Subcutaneous TID WC   insulin aspart  3 Units Subcutaneous TID WC   insulin glargine  10 Units Subcutaneous Daily   isosorbide-hydrALAZINE  1 tablet Oral TID   losartan  25 mg Oral Daily  potassium chloride  20 mEq Oral BID   sodium chloride flush  3 mL Intravenous Q12H   Continuous Infusions:  sodium chloride     sodium chloride     heparin       LOS: 3 days    Time spent: 28 minutes    Marrion Coy, MD [facility]   To contact the attending provider between 7A-7P or the covering provider during after hours 7P-7A, please log into the web site [email or web address] and access using universal [place] password for that web site. If you do not have the password, please call the hospital operator.  03/04/2021, 2:11 PM

## 2021-03-04 NOTE — Progress Notes (Signed)
Pt FSBS 421 - MD aware/ additional  orders given/ will monitor.

## 2021-03-04 NOTE — Progress Notes (Signed)
Progress Note  Patient Name: Marc [name] Date of Encounter: 03/04/2021  Primary Cardiologist: Debbe Odea, MD  Subjective   Improved shortness of breath but still with mild bilateral leg edema.  Right and left cardiac catheterization was done today which showed severe native three-vessel and left main coronary artery disease with patent grafts except SVG to OM 3.  Right heart catheterization showed mildly elevated filling pressures.  Inpatient Medications    Scheduled Meds:  aspirin EC  81 mg Oral Daily   atorvastatin  80 mg Oral QPM   carvedilol  25 mg Oral BID   furosemide  80 mg Intravenous BID   insulin aspart  0-15 Units Subcutaneous TID WC   insulin aspart  3 Units Subcutaneous TID WC   insulin glargine  10 Units Subcutaneous Daily   isosorbide-hydrALAZINE  1 tablet Oral TID   losartan  25 mg Oral Daily   potassium chloride  20 mEq Oral BID   sodium chloride flush  3 mL Intravenous Q12H   Continuous Infusions:  sodium chloride     sodium chloride     heparin 1,600 Units/hr (03/03/21 2052)   PRN Meds: sodium chloride, sodium chloride, acetaminophen, diphenhydrAMINE, hydrOXYzine, naphazoline-pheniramine, ondansetron (ZOFRAN) IV, sodium chloride, sodium chloride flush, traZODone   Vital Signs    Vitals:   03/04/21 1012 03/04/21 1025 03/04/21 1145 03/04/21 1200  BP: (!) 147/85  (!) 140/108 (!) 181/107  Pulse: 83  80 83  Resp: 18  18 20   Temp: 98.4 F (36.9 C)     TempSrc: Oral     SpO2:  98% 97% 95%  Weight:      Height:        Intake/Output Summary (Last 24 hours) at 03/04/2021 1221 Last data filed at 03/04/2021 0906 Gross per 24 hour  Intake 480 ml  Output 4430 ml  Net -3950 ml    Filed Weights   03/02/21 0519 03/03/21 0500 03/04/21 0058  Weight: 120 kg 119.5 kg 117.9 kg    Physical Exam   GEN: Obese, in no acute distress.  HEENT: Grossly normal.  Neck: Supple, moderately elevated JVP. Cardiac: IR,IR, no murmurs, rubs, or gallops. No  clubbing or cyanosis.  +1 bilat LE woody edema. Radials 2+, DP and PT 1+ and equal bilaterally.  Respiratory:  Respirations regular and unlabored, clear to auscultation bilaterally. GI: Obese, soft, nontender, nondistended, BS + x 4. MS: no deformity or atrophy. Skin: warm and dry. Neuro:  Strength and sensation are intact. Psych: Normal affect.  Labs    Chemistry Recent Labs  Lab 03/02/21 0620 03/03/21 0534 03/04/21 0549  NA 138 136 135  K 3.6 3.3* 3.7  CL 100 100 100  CO2 28 27 27   GLUCOSE 166* 263* 296*  BUN 20 20 19   CREATININE 1.03 1.01 1.11  CALCIUM 9.7 9.3 9.5  GFRNONAA >60 >60 >60  ANIONGAP 10 9 8       Hematology Recent Labs  Lab 03/02/21 0620 03/03/21 0534 03/04/21 0549  WBC 9.0 7.6 8.4  RBC 3.98* 4.04* 3.96*  HGB 12.6* 12.6* 12.7*  HCT 37.1* 36.9* 36.2*  MCV 93.2 91.3 91.4  MCH 31.7 31.2 32.1  MCHC 34.0 34.1 35.1  RDW 13.5 13.5 13.5  PLT 263 248 235     Cardiac Enzymes  Recent Labs  Lab 03/01/21 0351 03/01/21 0546 03/01/21 0809 03/01/21 1239 03/01/21 1642  TROPONINIHS 36* 190* 1,214* 3,688* 3,665*       BNP Recent Labs  Lab 03/01/21  0351  BNP 920.7*     Lipids  Lab Results  Component Value Date   CHOL 80 01/07/2021   HDL 26 (L) 01/07/2021   LDLCALC 40 01/07/2021   TRIG 69 01/07/2021   CHOLHDL 3.1 01/07/2021    HbA1c  Lab Results  Component Value Date   HGBA1C 7.8 (H) 01/07/2021    Radiology    DG Chest Portable 1 View  Result Date: 03/01/2021 CLINICAL DATA:  60 year old male with shortness of breath since 0100 hours. EXAM: PORTABLE CHEST 1 VIEW COMPARISON:  Portable chest 01/06/2021 and earlier. FINDINGS: Portable AP upright view at 0349 hours. Mildly lower lung volumes. Stable cardiomegaly and mediastinal contours. Prior sternotomy. Visualized tracheal air column is within normal limits. Mildly increased interstitial markings in both lungs since last month. No pneumothorax, pleural effusion or consolidation. Chronic  un-healed left clavicle fracture. Stable visualized osseous structures. IMPRESSION: Chronic cardiomegaly with lower lung volumes with mildly increased interstitial markings since last month. Consider mild or developing interstitial edema. Viral/atypical respiratory infection not excluded. Electronically Signed   By: Odessa Fleming M.D.   On: 03/01/2021 04:13   Telemetry    A-fib with PVCs, with 3 beats of NSVTs, 80-90s bpms  - Personally Reviewed  Cardiac Studies   2D Echocardiogram 06.28.2022  1. Left ventricular ejection fraction, by estimation, is 35 to 40%. The  left ventricle has moderately decreased function. Left ventricular  endocardial border not optimally defined to evaluate regional wall motion. There is moderate asymmetric left ventricular hypertrophy of the posterior segment. Left ventricular diastolic function could not be evaluated.   2. Right ventricular systolic function was not well visualized. The right ventricular size is not well visualized. Mildly increased right  ventricular wall thickness.   3. The mitral valve is degenerative. Unable to accurately assess mitral valve regurgitation.   4. The aortic valve is tricuspid. There is mild calcification of the  aortic valve. There is mild thickening of the aortic valve. Aortic valve regurgitation not well assessed. Mild to moderate aortic valve  sclerosis/calcification is present, without any  evidence of aortic stenosis.   2D Echocardiogram 10.19.2021   1. Left ventricular ejection fraction, by estimation, is 40 to 45%. The  left ventricle has mildly decreased function. The left ventricle  demonstrates global hypokinesis. The left ventricular internal cavity size was moderately dilated. There is moderate left ventricular hypertrophy. Left ventricular diastolic parameters are  consistent with Grade I diastolic dysfunction (impaired relaxation).   2. Right ventricular systolic function is moderately reduced. The right ventricular  size is normal. Mildly increased right ventricular wall thickness. There is normal pulmonary artery systolic pressure.   3. Left atrial size was mildly dilated.   4. Right atrial size was mildly dilated.   5. The mitral valve is grossly normal. Trivial mitral valve regurgitation.   6. The aortic valve is tricuspid. There is mild thickening of the aortic valve. Aortic valve regurgitation is not visualized. Mild aortic valve sclerosis is present, with no evidence of aortic valve stenosis.   7. Aortic dilatation noted. There is mild dilatation of the transverse  aorta, measuring 34 mm.   8. The inferior vena cava is normal in size with greater than 50%  respiratory variability, suggesting right atrial pressure of 3 mmHg.   Cardiac catheterization was done by me today: 1.  Severe underlying left main and three-vessel coronary artery disease with patent LIMA to LAD and patent SVG to OM2/ramus.  Occluded SVG to OM 3.  The RCA  is known to be small and nondominant and occluded proximally with some left-to-right collaterals. 2.  Left ventricular angiography was not performed.  EF was moderately reduced by echo. 3.  Right heart catheterization showed mildly elevated wedge pressure at 20 mmHg with a prominent V wave, mild pulmonary hypertension and moderately reduced cardiac output.   Recommendations: Recommend continuing medical therapy for coronary artery disease.  No good options to revascularize the OM 3 and LPL distributions given the need to stent the left main, mid left circumflex and 3 branches of the left circumflex.  Risks probably outweigh the benefit. The best option is to treat medically including optimizing heart failure and rhythm control. The patient is still volume overloaded.  Continue IV diuresis for another day and likely switch to oral diuretic tomorrow. Resume heparin drip in 6 hours and can switch to Eliquis tomorrow morning if no bleeding issues from the cath site.  Patient Profile      60 y.o. male with history of CAD s/p 5-vessel CABG in 2015 with LIMA to LAD, sequential SVG to ramus/OM2, and SVG to OM3, chronic combined systolic and diastolic CHF, ICM, persistent a-fib s/p DCCV on 02/23/21, PAD, DM, HTN, HLD, obesity, who was admitted 6/28 w/ acute CHF and recurrent AFib and noted to have NSTEMI w/ peak HsTrop of 3688.  Assessment & Plan    1. NSTEMI/CAD involving native coronary arteries s/p CABG: -Cardiac catheterization showed occluded SVG to OM 3 which seems to be chronic and not acute.  The patient has significant native three-vessel and left main coronary artery disease with diffuse small vessel disease.  Suspect elevated troponin is likely due to supply demand ischemia in the setting of atrial fibrillation and decompensated heart failure. Recommend continuing medical therapy for coronary artery disease.  No good options to revascularize the OM 3 and LPL distributions given the need to stent the left main, mid left circumflex and 3 branches of the left circumflex.  Risks probably outweigh the benefit.  2. Acute on chronic combined systolic and diastolic CHF/ICM:  Right heart catheterization today showed mildly elevated wedge pressure 20 mmHg.  The patient continues to be volume overloaded.  I recommend continuing IV diuresis for another day and switching to an oral diuretic likely tomorrow. - Continue PTA BiDil and Carvedilol -Recommend switching losartan to [facility] tomorrow if renal function is stable.   - Considered starting Spironolactone, however pt w/ recent hyperkalemia in the setting of spiro.   -Consider adding Farxiga before hospital discharge or during early follow-up.  3. Persistent A-fib:  - Patient recently had DCCV on 02/23/2021 with recurrent a.fib noted on admission. - He remains in a.fib with controlled ventricular response - 80's to 90's. - Continue 25 mg Carvedilol BID for rate control  -Resume heparin drip 6 hours after cath and transition to  Eliquis likely tomorrow if no bleeding issues from the left arm cath site. - Following cath, will need to consider initiation of AAD (tikosyn vs amio), along w/ resumption of OAC.  Would prob plan outpt DCCV at some point in 3-4 wks if he doesn't convert.  Dr. Johney Frame is rounding tomorrow and will get his opinion.  I suspect that the easiest thing would be to use amiodarone at least in the short-term as a bridge to outpatient ablation if appropriate.  4. HTN:  - BP remain elevated this morning -Plan to transition to [facility] tomorrow.  Otw, cont ? blocker and hydral/nitrate.  5. HLD:  - Continue home dose of  80 mg atorvastatin.  LDL 40.  6. DM2:  - HA1C: 7.8  - SSI per IM.      Signed, Lorine Bears, MD  03/04/2021, 12:21 PM

## 2021-03-04 NOTE — Consult Note (Signed)
ANTICOAGULATION CONSULT NOTE   Pharmacy Consult for heparin Indication: chest pain/ACS  Patient Measurements: Heparin Dosing Weight: 105.3 kg  Labs: Recent Labs    03/01/21 0809 03/01/21 1239 03/01/21 1334 03/01/21 1642 03/01/21 1955 03/02/21 0620 03/02/21 1209 03/03/21 0534 03/04/21 0549  HGB  --   --   --   --    < > 12.6*  --  12.6* 12.7*  HCT  --   --   --   --   --  37.1*  --  36.9* 36.2*  PLT  --   --   --   --   --  263  --  248 235  APTT 36  --    < >  --    < > 102* 75* 70*  --   HEPARINUNFRC 0.77*  --   --   --   --  0.84*  --  0.53 0.51  CREATININE  --   --   --   --   --  1.03  --  1.01  --   TROPONINIHS 1,214* 3,688*  --  3,665*  --   --   --   --   --    < > = values in this interval not displayed.     Estimated Creatinine Clearance: 103.3 mL/min (by C-G formula based on SCr of 1.01 mg/dL).   Medical History: Past Medical History:  Diagnosis Date   Anxiety    Chronic systolic heart failure (HCC)    a. 07/2013 Echo: Ejection fraction of 45-50% with possible inferior wall hypokinesis; b. 03/2018 Echo: EF 40-45%; c. 06/2020 Echo: EF 40-45%, glob HK. Mod LVH, gr1 DD. Nl PASP. Mild BAE. Triv MR. Mild AoV sclerosis. Mild Ao dil (48mm).   Coronary artery disease    Significant left main and Severe three-vessel coronary artery disease in December of 2014. He underwent CABG in January of 2015 with LIMA to LAD, sequential SVG to ramus/OM 2 and SVG to OM 3   Diabetes mellitus without complication (HCC)    Hyperlipidemia    Hypertension    Ischemic cardiomyopathy    a. 07/2013 Echo: EF 45-50%; b. 03/2018 Echo: EF 40-45%; c. 06/2020 Echo: EF 40-45%.   Medical non-compliance    MI (myocardial infarction) (HCC)    Obesity    PAD (peripheral artery disease) (HCC)    a. 04/2020 PTA of L PT.   PAF (paroxysmal atrial fibrillation) (HCC)    Tobacco abuse     Medications:  Eliquis 5 mg BID   Assessment: 60 y.o. male with history of CHF, CAD, HTN, diabetes, HLD,  paroxysmal atrial fibrillation on Eliquis who presents to the emergency department with shortness of breath and bilateral lower extremity swelling. Patient was found to have troponin elevated to 190. Pharmacy has been consulted for heparin dosing for ACS.   Hgb: 10.9>12.6; plts 225>263. 6/28 Baseline HL 0.77, aPTT 36s  Date Time aPTT/HL Rate/Comment 6/28  1334  74s / --  1400 un/hr 6/28 1955 59s / --  1400 > 1600 un/hr 6/29 0620 102s / 0.84 Thera x1; 1600 un/hr 6/29 1209 75s / --  Thera x2; 1600 un/hr 6/30  0534 70s/0.53 7/01 0549 HL 0.51  Goal of Therapy:  Heparin level 0.3-0.7 units/ml once aPTT and HL correlates.  aPTT 66-102 seconds Monitor platelets by anticoagulation protocol: Yes   Plan:  HL remains therapeutic Continue heparin infusion rate at 1600 units/hr Re-check HL with AM labs tomorrow CTM with Daily CBC  per protocol while on IV heparin  Otelia Sergeant, PharmD, [facility] 03/04/2021 6:29 AM

## 2021-03-04 NOTE — Interval H&P Note (Signed)
History and Physical Interval Note:  03/04/2021 10:34 AM  Marc [name]  has presented today for surgery, with the diagnosis of nstemi.  The various methods of treatment have been discussed with the patient and family. After consideration of risks, benefits and other options for treatment, the patient has consented to  Procedure(s): RIGHT/LEFT HEART CATH AND CORONARY/GRAFT ANGIOGRAPHY (N/A) as a surgical intervention.  The patient's history has been reviewed, patient examined, no change in status, stable for surgery.  I have reviewed the patient's chart and labs.  Questions were answered to the patient's satisfaction.     Lorine Bears

## 2021-03-04 NOTE — Consult Note (Signed)
ANTICOAGULATION CONSULT NOTE   Pharmacy Consult for heparin Indication: chest pain/ACS  Patient Measurements: Heparin Dosing Weight: 105.3 kg  Labs: Recent Labs    03/01/21 1239 03/01/21 1334 03/01/21 1642 03/01/21 1955 03/02/21 0620 03/02/21 1209 03/03/21 0534 03/04/21 0549  HGB  --   --   --    < > 12.6*  --  12.6* 12.7*  HCT  --   --   --   --  37.1*  --  36.9* 36.2*  PLT  --   --   --   --  263  --  248 235  APTT  --    < >  --    < > 102* 75* 70*  --   HEPARINUNFRC  --   --   --   --  0.84*  --  0.53 0.51  CREATININE  --   --   --   --  1.03  --  1.01 1.11  TROPONINIHS 3,688*  --  3,665*  --   --   --   --   --    < > = values in this interval not displayed.     Estimated Creatinine Clearance: 94 mL/min (by C-G formula based on SCr of 1.11 mg/dL).   Medical History: Past Medical History:  Diagnosis Date   Anxiety    Chronic systolic heart failure (HCC)    a. 07/2013 Echo: Ejection fraction of 45-50% with possible inferior wall hypokinesis; b. 03/2018 Echo: EF 40-45%; c. 06/2020 Echo: EF 40-45%, glob HK. Mod LVH, gr1 DD. Nl PASP. Mild BAE. Triv MR. Mild AoV sclerosis. Mild Ao dil (95mm).   Coronary artery disease    Significant left main and Severe three-vessel coronary artery disease in December of 2014. He underwent CABG in January of 2015 with LIMA to LAD, sequential SVG to ramus/OM 2 and SVG to OM 3   Diabetes mellitus without complication (HCC)    Hyperlipidemia    Hypertension    Ischemic cardiomyopathy    a. 07/2013 Echo: EF 45-50%; b. 03/2018 Echo: EF 40-45%; c. 06/2020 Echo: EF 40-45%.   Medical non-compliance    MI (myocardial infarction) (HCC)    Obesity    PAD (peripheral artery disease) (HCC)    a. 04/2020 PTA of L PT.   PAF (paroxysmal atrial fibrillation) (HCC)    Tobacco abuse     Medications:  Eliquis 5 mg BID   Assessment: 60 y.o. male with history of CHF, CAD, HTN, diabetes, HLD, paroxysmal atrial fibrillation on Eliquis who presents to  the emergency department with shortness of breath and bilateral lower extremity swelling. Patient was found to have troponin elevated to 190. Pharmacy has been consulted for heparin dosing for ACS.   7/01: LHC shows severe underlying left main and three-vessel coronary artery disease with patent LIMA to LAD and patent SVG to OM2/ramus.  Occluded SVG to OM 3.   Hgb: 10.9>12.6; plts 225>263. 6/28 Baseline HL 0.77, aPTT 36s  Date Time aPTT/HL Rate/Comment 6/28  1334  74s / --  1400 un/hr 6/28 1955 59s / --  1400 > 1600 un/hr 6/29 0620 102s / 0.84 Thera x1; 1600 un/hr 6/29 1209 75s / --  Thera x2; 1600 un/hr 6/30  0534 70s/0.53 7/01 0549 HL 0.51  Goal of Therapy:  Heparin level 0.3-0.7 units/ml once aPTT and HL correlates.  aPTT 66-102 seconds Monitor platelets by anticoagulation protocol: Yes   Plan:  HL remains therapeutic. Per cardiology: "Resume heparin drip 6  hours (1730 7/01) after cath and transition to Eliquis likely tomorrow (7/02) if no bleeding issues from the left arm cath site" Resume heparin infusion rate at 1600 units/hr starting @ 1730 7/01 Re-check HL with AM labs tomorrow CTM with Daily CBC per protocol while on IV heparin  Martyn Malay, [facility] 03/04/2021 12:38 PM

## 2021-03-04 NOTE — Plan of Care (Signed)

## 2021-03-05 DIAGNOSIS — I10 Essential (primary) hypertension: Secondary | ICD-10-CM

## 2021-03-05 LAB — CBC
HCT: 39.7 % (ref 39.0–52.0)
Hemoglobin: 13.7 g/dL (ref 13.0–17.0)
MCH: 31 pg (ref 26.0–34.0)
MCHC: 34.5 g/dL (ref 30.0–36.0)
MCV: 89.8 fL (ref 80.0–100.0)
Platelets: 267 10*3/uL (ref 150–400)
RBC: 4.42 MIL/uL (ref 4.22–5.81)
RDW: 13.5 % (ref 11.5–15.5)
WBC: 8.6 10*3/uL (ref 4.0–10.5)
nRBC: 0 % (ref 0.0–0.2)

## 2021-03-05 LAB — BASIC METABOLIC PANEL
Anion gap: 11 (ref 5–15)
BUN: 20 mg/dL (ref 8–23)
CO2: 27 mmol/L (ref 22–32)
Calcium: 10 mg/dL (ref 8.9–10.3)
Chloride: 97 mmol/L — ABNORMAL LOW (ref 98–111)
Creatinine, Ser: 1.12 mg/dL (ref 0.61–1.24)
GFR, Estimated: >60 mL/min (ref >60)
Glucose, Bld: 349 mg/dL — ABNORMAL HIGH (ref 70–99)
Potassium: 4 mmol/L (ref 3.5–5.1)
Sodium: 135 mmol/L (ref 135–145)

## 2021-03-05 LAB — GLUCOSE, CAPILLARY
Glucose-Capillary: 336 mg/dL — ABNORMAL HIGH (ref 70–99)
Glucose-Capillary: 421 mg/dL — ABNORMAL HIGH (ref 70–99)
Glucose-Capillary: 174 mg/dL — ABNORMAL HIGH (ref 70–99)
Glucose-Capillary: 293 mg/dL — ABNORMAL HIGH (ref 70–99)

## 2021-03-05 LAB — HEPARIN LEVEL (UNFRACTIONATED): Heparin Unfractionated: 0.42 IU/mL (ref 0.30–0.70)

## 2021-03-05 LAB — MAGNESIUM: Magnesium: 1.7 mg/dL (ref 1.7–2.4)

## 2021-03-05 MED ORDER — SODIUM CHLORIDE 0.9% FLUSH: 3.0000 mL | INTRAVENOUS | Status: DC | PRN

## 2021-03-05 MED ORDER — APIXABAN 5 MG PO TABS
5.0000 mg | ORAL_TABLET | Freq: Two times a day (BID) | ORAL | Status: DC
  Administered 2021-03-05 – 2021-03-08 (×6): 5 mg via ORAL
  Filled 2021-03-05 (×6): qty 1

## 2021-03-05 MED ORDER — SODIUM CHLORIDE 0.9 % IV SOLN: 250.0000 mL | INTRAVENOUS | Status: DC | PRN

## 2021-03-05 MED ORDER — DAPAGLIFLOZIN PROPANEDIOL 5 MG PO TABS
10.0000 mg | ORAL_TABLET | Freq: Every day | ORAL | Status: DC
  Administered 2021-03-05 – 2021-03-08 (×4): 10 mg via ORAL
  Filled 2021-03-05 (×5): qty 2

## 2021-03-05 MED ORDER — MAGNESIUM SULFATE 2 GM/50ML IV SOLN
2.0000 g | Freq: Once | INTRAVENOUS | Status: AC
  Administered 2021-03-05: 2 g via INTRAVENOUS
  Filled 2021-03-05: qty 50

## 2021-03-05 MED ORDER — SODIUM CHLORIDE 0.9% FLUSH
3.0000 mL | Freq: Two times a day (BID) | INTRAVENOUS | Status: DC
  Administered 2021-03-05 – 2021-03-08 (×7): 3 mL via INTRAVENOUS

## 2021-03-05 MED ORDER — SACUBITRIL-VALSARTAN 49-51 MG PO TABS
1.0000 | ORAL_TABLET | Freq: Two times a day (BID) | ORAL | Status: DC
  Administered 2021-03-05 – 2021-03-08 (×7): 1 via ORAL
  Filled 2021-03-05 (×7): qty 1

## 2021-03-05 MED ORDER — MAGNESIUM OXIDE -MG SUPPLEMENT 400 (240 MG) MG PO TABS
400.0000 mg | ORAL_TABLET | Freq: Every day | ORAL | Status: DC
  Administered 2021-03-05 – 2021-03-08 (×4): 400 mg via ORAL
  Filled 2021-03-05 (×4): qty 1

## 2021-03-05 MED ORDER — ZOLPIDEM TARTRATE 5 MG PO TABS
5.0000 mg | ORAL_TABLET | Freq: Every evening | ORAL | Status: DC | PRN
  Administered 2021-03-05 – 2021-03-07 (×3): 5 mg via ORAL
  Filled 2021-03-05 (×3): qty 1

## 2021-03-05 MED ORDER — INSULIN ASPART 100 UNIT/ML IJ SOLN
8.0000 [IU] | Freq: Three times a day (TID) | INTRAMUSCULAR | Status: DC
  Administered 2021-03-05: 8 [IU] via SUBCUTANEOUS
  Filled 2021-03-05: qty 1

## 2021-03-05 MED ORDER — INSULIN GLARGINE 100 UNIT/ML ~~LOC~~ SOLN
30.0000 [IU] | Freq: Every day | SUBCUTANEOUS | Status: DC
  Administered 2021-03-05 – 2021-03-08 (×4): 30 [IU] via SUBCUTANEOUS
  Filled 2021-03-05 (×4): qty 0.3

## 2021-03-05 MED ORDER — DOFETILIDE 250 MCG PO CAPS
500.0000 ug | ORAL_CAPSULE | Freq: Two times a day (BID) | ORAL | Status: DC
  Administered 2021-03-05 – 2021-03-08 (×6): 500 ug via ORAL
  Filled 2021-03-05 (×7): qty 2

## 2021-03-05 MED ORDER — INSULIN ASPART 100 UNIT/ML IJ SOLN
10.0000 [IU] | Freq: Three times a day (TID) | INTRAMUSCULAR | Status: DC
  Administered 2021-03-05 – 2021-03-08 (×10): 10 [IU] via SUBCUTANEOUS
  Filled 2021-03-05 (×10): qty 1

## 2021-03-05 NOTE — Progress Notes (Deleted)
CSW called [facility] as patient reports losing phone and wallet with no way home at discharge today.   [facility] suggested staff [place] [employer] today as they were not available.   CSW has completed taxi voucher for patient to get back home, sent to RN to call [place] [facility] when ready.   No other dc needs identified at this time.   [place], [place] [phone]

## 2021-03-05 NOTE — Progress Notes (Signed)
PROGRESS NOTE    Marc [name]  XBJ:[id number] DOB: 08/07/1961 DOA: 03/01/2021 PCP: [facility]    Brief Narrative:  Marc [name] is a 60 y.o. male with medical history significant for atrial fibrillation status post recent DC cardioversion, history of chronic systolic CHF last known LVEF of 40 to 45%, history of coronary artery disease status post CABG x4, hypertension, diabetes mellitus and dyslipidemia who presents to the emergency room for evaluation of shortness of breath and increased weight gain.  He is diagnosed with acute on chronic systolic congestive heart failure, he was started on IV Lasix.  He was seen by cardiology, peak troponin was 3688, heart cath on 7/1 showed multivessel disease.  Cardiology decided against further intervention, patient will be treated medically.   Assessment & Plan:   Principal Problem:   Acute on chronic systolic CHF (congestive heart failure) (HCC) Active Problems:   Hypertension   CAD (coronary artery disease)   Type 2 diabetes mellitus without complication (HCC)   Atrial fibrillation (HCC)   Anxiety   Hypokalemia   NSTEMI (non-ST elevated myocardial infarction) (HCC)  #1.  Acute on chronic systolic congestive heart failure. Non-STEMI. Is followed by cardiology, he is still volume overloaded, continued on IV Lasix. Is also on heparin drip.  He is status post heart cath, has 1 blocked graft.  #2.  Paroxysmal atrial fibrillation. Continue heparin drip.  3.  Type 2 diabetes with hyperglycemia. Patient had significant hyperglycemia, it appears that he has significant insulin resistance.  Increase Lantus, scheduled NovoLog.  Continue to follow.  DVT prophylaxis: Heparin Code Status: full Family Communication:  Disposition Plan:    Status is: Inpatient  Remains inpatient appropriate because:IV treatments appropriate due to intensity of illness or inability to take PO and Inpatient level of care appropriate due to  severity of illness  Dispo: The patient is from: Home              Anticipated d/c is to: Home              Patient currently is not medically stable to d/c.   Difficult to place patient No        I/O last 3 completed shifts: In: 723 [P.O.:720; I.V.:3] Out: 4950 [Urine:4950] Total I/O In: 480 [P.O.:480] Out: 1000 [Urine:1000]     Consultants:  card  Procedures: heart cath  Antimicrobials: None  Subjective: Patient feels better, no signal short of breath.  He was able to walk without significant shortness of breath. No chest pain.  No palpitation. No fever or chills. No dysuria or hematuria. No abdominal pain or nausea vomiting.  Objective: Vitals:   03/05/21 0242 03/05/21 0419 03/05/21 0811 03/05/21 1125  BP:  (!) 146/85 (!) 160/91 131/72  Pulse:  83 86 76  Resp:  20 18 18   Temp:  99 F (37.2 C) 98.3 F (36.8 C) 98 F (36.7 C)  TempSrc:      SpO2:  96% 98% 98%  Weight: 116.4 kg     Height:        Intake/Output Summary (Last 24 hours) at 03/05/2021 1145 Last data filed at 03/05/2021 1128 Gross per 24 hour  Intake 1203 ml  Output 3150 ml  Net -1947 ml   Filed Weights   03/03/21 0500 03/04/21 0058 03/05/21 0242  Weight: 119.5 kg 117.9 kg 116.4 kg    Examination:  General exam: Appears calm and comfortable  Respiratory system: Clear to auscultation. Respiratory effort normal. Cardiovascular system:  S1 & S2 heard, RRR. No JVD, murmurs, rubs, gallops or clicks. No pedal edema. Gastrointestinal system: Abdomen is nondistended, soft and nontender. No organomegaly or masses felt. Normal bowel sounds heard. Central nervous system: Alert and oriented. No focal neurological deficits. Extremities: Symmetric 5 x 5 power. Skin: No rashes, lesions or ulcers Psychiatry: Judgement and insight appear normal. Mood & affect appropriate.     Data Reviewed: I have personally reviewed following labs and imaging studies  CBC: Recent Labs  Lab 03/01/21 0351  03/02/21 0620 03/03/21 0534 03/04/21 0549 03/05/21 0624  WBC 10.5 9.0 7.6 8.4 8.6  NEUTROABS 8.7*  --   --   --   --   HGB 10.9* 12.6* 12.6* 12.7* 13.7  HCT 31.8* 37.1* 36.9* 36.2* 39.7  MCV 92.4 93.2 91.3 91.4 89.8  PLT 225 263 248 235 267   Basic Metabolic Panel: Recent Labs  Lab 03/01/21 0351 03/02/21 0620 03/03/21 0534 03/04/21 0549 03/05/21 0624  NA 138 138 136 135 135  K 3.1* 3.6 3.3* 3.7 4.0  CL 103 100 100 100 97*  CO2 26 28 27 27 27   GLUCOSE 167* 166* 263* 296* 349*  BUN 16 20 20 19 20   CREATININE 1.11 1.03 1.01 1.11 1.12  CALCIUM 9.0 9.7 9.3 9.5 10.0  MG 1.8  --  1.9 1.7 1.7   GFR: Estimated Creatinine Clearance: 92.6 mL/min (by C-G formula based on SCr of 1.12 mg/dL). Liver Function Tests: No results for input(s): AST, ALT, ALKPHOS, BILITOT, PROT, ALBUMIN in the last 168 hours. No results for input(s): LIPASE, AMYLASE in the last 168 hours. No results for input(s): AMMONIA in the last 168 hours. Coagulation Profile: No results for input(s): INR, PROTIME in the last 168 hours. Cardiac Enzymes: No results for input(s): CKTOTAL, CKMB, CKMBINDEX, TROPONINI in the last 168 hours. BNP (last 3 results) No results for input(s): PROBNP in the last 8760 hours. HbA1C: No results for input(s): HGBA1C in the last 72 hours. CBG: Recent Labs  Lab 03/04/21 1147 03/04/21 1613 03/04/21 2038 03/05/21 0813 03/05/21 1126  GLUCAP 226* 421* 400* 336* 421*   Lipid Profile: No results for input(s): CHOL, HDL, LDLCALC, TRIG, CHOLHDL, LDLDIRECT in the last 72 hours. Thyroid Function Tests: No results for input(s): TSH, T4TOTAL, FREET4, T3FREE, THYROIDAB in the last 72 hours. Anemia Panel: No results for input(s): VITAMINB12, FOLATE, FERRITIN, TIBC, IRON, RETICCTPCT in the last 72 hours. Sepsis Labs: No results for input(s): PROCALCITON, LATICACIDVEN in the last 168 hours.  Recent Results (from the past 240 hour(s))  Resp Panel by RT-PCR (Flu A&B, Covid)     Status:  None   Collection Time: 03/01/21  4:29 AM  Result Value Ref Range Status   SARS Coronavirus 2 by RT PCR NEGATIVE NEGATIVE Final    Comment: (NOTE) SARS-CoV-2 target nucleic acids are NOT DETECTED.  The SARS-CoV-2 RNA is generally detectable in upper respiratory specimens during the acute phase of infection. The lowest concentration of SARS-CoV-2 viral copies this assay can detect is 138 copies/mL. A negative result does not preclude SARS-Cov-2 infection and should not be used as the sole basis for treatment or other patient management decisions. A negative result may occur with  improper specimen collection/handling, submission of specimen other than nasopharyngeal swab, presence of viral mutation(s) within the areas targeted by this assay, and inadequate number of viral copies(<138 copies/mL). A negative result must be combined with clinical observations, patient history, and epidemiological information. The expected result is Negative.  Fact Sheet for  Patients:  BloggerCourse.com  Fact Sheet for Healthcare Providers:  SeriousBroker.it  This test is no t yet approved or cleared by the [place] FDA and  has been authorized for detection and/or diagnosis of SARS-CoV-2 by FDA under an Emergency Use Authorization (EUA). This EUA will remain  in effect (meaning this test can be used) for the duration of the COVID-19 declaration under Section 564(b)(1) of the Act, 21 U.S.C.section 360bbb-3(b)(1), unless the authorization is terminated  or revoked sooner.       Influenza A by PCR NEGATIVE NEGATIVE Final   Influenza B by PCR NEGATIVE NEGATIVE Final    Comment: (NOTE) The Xpert Xpress SARS-CoV-2/FLU/RSV plus assay is intended as an aid in the diagnosis of influenza from Nasopharyngeal swab specimens and should not be used as a sole basis for treatment. Nasal washings and aspirates are unacceptable for Xpert Xpress  SARS-CoV-2/FLU/RSV testing.  Fact Sheet for Patients: BloggerCourse.com  Fact Sheet for Healthcare Providers: SeriousBroker.it  This test is not yet approved or cleared by the [place] FDA and has been authorized for detection and/or diagnosis of SARS-CoV-2 by FDA under an Emergency Use Authorization (EUA). This EUA will remain in effect (meaning this test can be used) for the duration of the COVID-19 declaration under Section 564(b)(1) of the Act, 21 U.S.C. section 360bbb-3(b)(1), unless the authorization is terminated or revoked.  Performed at [facility], [address] [place], [place] [address]          Radiology Studies: CARDIAC CATHETERIZATION  Result Date: 03/04/2021  Ost RCA to Prox RCA lesion is 100% stenosed.  Mid LM to Dist LM lesion is 90% stenosed.  Ost LAD to Prox LAD lesion is 100% stenosed.  Ost Cx to Prox Cx lesion is 50% stenosed.  Mid Cx to Dist Cx lesion is 60% stenosed.  3rd Mrg lesion is 90% stenosed.  1st LPL lesion is 90% stenosed.  2nd LPL lesion is 90% stenosed.  SVG graft was visualized by angiography.  Prox Graft to Mid Graft lesion is 30% stenosed.  Origin to Prox Graft lesion is 100% stenosed.  LIMA graft was visualized by angiography and is normal in caliber.  The graft exhibits no disease.  Dist LAD lesion is 60% stenosed.  Mid LAD lesion is 70% stenosed.  1.  Severe underlying left main and three-vessel coronary artery disease with patent LIMA to LAD and patent SVG to OM2/ramus.  Occluded SVG to OM 3.  The RCA is known to be small and nondominant and occluded proximally with some left-to-right collaterals. 2.  Left ventricular angiography was not performed.  EF was moderately reduced by echo. 3.  Right heart catheterization showed mildly elevated wedge pressure at 20 mmHg with a prominent V wave, mild pulmonary hypertension and moderately reduced cardiac output.  Recommendations: Recommend continuing medical therapy for coronary artery disease.  No good options to revascularize the OM 3 and LPL distributions given the need to stent the left main, mid left circumflex and 3 branches of the left circumflex.  Risks probably outweigh the benefit. The best option is to treat medically including optimizing heart failure and rhythm control. The patient is still volume overloaded.  Continue IV diuresis for another day and likely switch to oral diuretic tomorrow. Resume heparin drip in 6 hours and can switch to Eliquis tomorrow morning if no bleeding issues from the cath site.        Scheduled Meds:  aspirin EC  81 mg Oral Daily   atorvastatin  80 mg Oral QPM   carvedilol  25 mg Oral BID   dapagliflozin propanediol  10 mg Oral Daily   furosemide  40 mg Intravenous BID   insulin aspart  0-15 Units Subcutaneous TID AC & HS   insulin aspart  8 Units Subcutaneous TID WC   insulin glargine  30 Units Subcutaneous Daily   isosorbide-hydrALAZINE  1 tablet Oral TID   potassium chloride  20 mEq Oral BID   sacubitril-valsartan  1 tablet Oral BID   sodium chloride flush  3 mL Intravenous Q12H   Continuous Infusions:  sodium chloride     sodium chloride     heparin 1,600 Units/hr (03/04/21 2135)     LOS: 4 days    Time spent: 28 minutes    Marrion Coy, MD [facility]   To contact the attending provider between 7A-7P or the covering provider during after hours 7P-7A, please log into the web site [email or web address] and access using universal [place] password for that web site. If you do not have the password, please call the hospital operator.  03/05/2021, 11:45 AM

## 2021-03-05 NOTE — Progress Notes (Addendum)
Progress Note  Patient Name: Marc [name] Date of Encounter: 03/05/2021  Primary Cardiologist: Debbe Odea, MD  Subjective   Feels well this AM.  Denies c/p or dyspnea.  Still notes bilat lower ext edema.  Inpatient Medications    Scheduled Meds:  aspirin EC  81 mg Oral Daily   atorvastatin  80 mg Oral QPM   carvedilol  25 mg Oral BID   dapagliflozin propanediol  10 mg Oral Daily   furosemide  40 mg Intravenous BID   insulin aspart  0-15 Units Subcutaneous TID AC & HS   insulin aspart  8 Units Subcutaneous TID WC   insulin glargine  30 Units Subcutaneous Daily   isosorbide-hydrALAZINE  1 tablet Oral TID   potassium chloride  20 mEq Oral BID   sacubitril-valsartan  1 tablet Oral BID   sodium chloride flush  3 mL Intravenous Q12H   Continuous Infusions:  sodium chloride     sodium chloride     heparin 1,600 Units/hr (03/04/21 2135)   PRN Meds: sodium chloride, sodium chloride, acetaminophen, diphenhydrAMINE, hydrOXYzine, naphazoline-pheniramine, ondansetron (ZOFRAN) IV, sodium chloride, sodium chloride flush, traZODone   Vital Signs    Vitals:   03/04/21 1931 03/05/21 0242 03/05/21 0419 03/05/21 0811  BP: 139/78  (!) 146/85 (!) 160/91  Pulse: 72  83 86  Resp: 20  20 18   Temp: (!) 97.5 F (36.4 C)  99 F (37.2 C) 98.3 F (36.8 C)  TempSrc:      SpO2: 97%  96% 98%  Weight:  116.4 kg    Height:        Intake/Output Summary (Last 24 hours) at 03/05/2021 1015 Last data filed at 03/05/2021 0950 Gross per 24 hour  Intake 1203 ml  Output 2150 ml  Net -947 ml   Filed Weights   03/03/21 0500 03/04/21 0058 03/05/21 0242  Weight: 119.5 kg 117.9 kg 116.4 kg    Physical Exam   GEN: Obese, in no acute distress.  HEENT: Grossly normal.  Neck: Supple, mod elevated JVP. Cardiac: IR, IR, no murmurs, rubs, or gallops. No clubbing, cyanosis, 1+ bilat LE woody edema.  Radials 2+, DP/PT 2+ and equal bilaterally. L radial/brachial cath site w/o  bleeding/bruit/hematoma. Respiratory:  Respirations regular and unlabored, clear to auscultation bilaterally. GI: Obese, soft, nontender, nondistended, BS + x 4. MS: no deformity or atrophy. Skin: warm and dry, no rash. Neuro:  Strength and sensation are intact. Psych: AAOx3.  Normal affect.  Labs    Chemistry Recent Labs  Lab 03/03/21 0534 03/04/21 0549 03/05/21 0624  NA 136 135 135  K 3.3* 3.7 4.0  CL 100 100 97*  CO2 27 27 27   GLUCOSE 263* 296* 349*  BUN 20 19 20   CREATININE 1.01 1.11 1.12  CALCIUM 9.3 9.5 10.0  GFRNONAA >60 >60 >60  ANIONGAP 9 8 11      Hematology Recent Labs  Lab 03/03/21 0534 03/04/21 0549 03/05/21 0624  WBC 7.6 8.4 8.6  RBC 4.04* 3.96* 4.42  HGB 12.6* 12.7* 13.7  HCT 36.9* 36.2* 39.7  MCV 91.3 91.4 89.8  MCH 31.2 32.1 31.0  MCHC 34.1 35.1 34.5  RDW 13.5 13.5 13.5  PLT 248 235 267    Cardiac Enzymes  Recent Labs  Lab 03/01/21 0351 03/01/21 0546 03/01/21 0809 03/01/21 1239 03/01/21 1642  TROPONINIHS 36* 190* 1,214* 3,688* 3,665*      BNP Recent Labs  Lab 03/01/21 0351  BNP 920.7*     Lipids  Lab  Results  Component Value Date   CHOL 80 01/07/2021   HDL 26 (L) 01/07/2021   LDLCALC 40 01/07/2021   TRIG 69 01/07/2021   CHOLHDL 3.1 01/07/2021    HbA1c  Lab Results  Component Value Date   HGBA1C 7.8 (H) 01/07/2021    Radiology    -------  Telemetry    AFib, 70's to 90's, PVCs - Personally Reviewed  Cardiac Studies   2D Echocardiogram 06.28.2022   1. Left ventricular ejection fraction, by estimation, is 35 to 40%. The  left ventricle has moderately decreased function. Left ventricular  endocardial border not optimally defined to evaluate regional wall motion. There is moderate asymmetric left ventricular hypertrophy of the posterior segment. Left ventricular diastolic function could not be evaluated.   2. Right ventricular systolic function was not well visualized. The right ventricular size is not well  visualized. Mildly increased right  ventricular wall thickness.   3. The mitral valve is degenerative. Unable to accurately assess mitral valve regurgitation.   4. The aortic valve is tricuspid. There is mild calcification of the  aortic valve. There is mild thickening of the aortic valve. Aortic valve regurgitation not well assessed. Mild to moderate aortic valve  sclerosis/calcification is present, without any  evidence of aortic stenosis.    Cardiac catheterization 7.1.2022     1.  Severe underlying left main and three-vessel coronary artery disease with patent LIMA to LAD and patent SVG to OM2/ramus.  Occluded SVG to OM 3.  The RCA is known to be small and nondominant and occluded proximally with some left-to-right collaterals. 2.  Left ventricular angiography was not performed.  EF was moderately reduced by echo. 3.  Right heart catheterization showed mildly elevated wedge pressure at 20 mmHg with a prominent V wave, mild pulmonary hypertension and moderately reduced cardiac output.   Recommendations: Recommend continuing medical therapy for coronary artery disease.  No good options to revascularize the OM 3 and LPL distributions given the need to stent the left main, mid left circumflex and 3 branches of the left circumflex.  Risks probably outweigh the benefit. The best option is to treat medically including optimizing heart failure and rhythm control. The patient is still volume overloaded.  Continue IV diuresis for another day and likely switch to oral diuretic tomorrow. Resume heparin drip in 6 hours and can switch to Eliquis tomorrow morning if no bleeding issues from the cath site. _____________   Patient Profile     60 y.o. male with history of CAD s/p 4-vessel CABG in 2015 with LIMA to LAD, sequential SVG to ramus/OM2, and SVG to OM3, chronic combined systolic and diastolic CHF, ICM, persistent a-fib s/p DCCV on 02/23/21, PAD, DM, HTN, HLD, obesity, who was admitted 6/28 w/  acute CHF and recurrent AFib and noted to have NSTEMI w/ peak HsTrop of 3688.  Cath 7/1 w/ 3/4 patent grafts (occluded VG  OM3) and CTO of RCA  Med rx.  Assessment & Plan    1.  NSTEMI/CAD:  s/p prior CABG.  Peak HsTrop 3688.  Cath 7/1 w/ 3/4 patent grafts and CTO of RCA.  The VG  OM3 is occluded, which was felt to be chronic.  Trop elev felt to be 2/2 demand ischemia in setting of AFib and decompensated CHF w/ ongoing elevation of R heart pressures on cath.  No chest pain or dyspnea this AM.  Cont asa, statin, ? blocker.  No P2Y12 inhibitor in setting of plan to resume eliquis today.  2.  Acute on chronic combined syst/diast CHF/ICM:  EF 35-40% by echo this admission.  PCWP on RHC 7/1.  Minus 1.7L yesterday and minus 10L since admission.  Wt down 1.5kg overnight and ~ 7 kg since admission.  Prev dry wt appears to be around 245 lbs (currently 256 lbs).  Ongoing lower ext edema and JVD.  Renal fxn/K stable.  Cont IV lasix today.  Will transition losartan to entresto.  Avoiding MRA in setting of prior hyperkalemia on spiro.  Cont bidil.  Add farxiga.  3.  Persistent Afib: S/p DCCV 02/23/21 w/ recurrent Afib on admission. Rates stable in the 70's to 90's.  Cont ? blocker.  Cath sites look good and H/H stable.  Will transition heparin to eliquis today.   Will discuss w/ EP, but likely plan to add AAD (amio vs tikosyn) today.  He has been anticoagulated throughout admission w/ exception of brief heparin hold for cath 7/1.  If he doesn't convert on AAD, will plan for DCCV in a few wks as outpt.  4.  Essential HTN:  BP remains elevated.  Follow w/ transition from losartan to entresto.  Cont ? blocker/bidil.  5.  HL:  cont high potency statin rx.  LDL 40.    6.  DMII:  A1c 7.8.  Adding farxiga in setting of above.  Signed, Nicolasa Ducking, NP  03/05/2021, 10:15 AM    For questions or updates, please contact   Please consult [email or web address] for contact info under Cardiology/STEMI.     I have  seen, examined the patient, and reviewed the above assessment and plan.  Changes to above are made where necessary.  On exam, overweight,  remains volume overloaded with 2+ edema, iRRR.   The patient has persistent afib with LA size of 6.2 cm.  He will require AAD in order to maintain sinus long term.  Given reduced EF, his options are tikosyn and amiodarone.  I would favor initiation of tikosyn. I had a long discussion with the patient discussing risks and benefits to this medicine.  He wishes to proceed.  He is appropriately anticoagulated. Start tikosyn 500 mcg BID this evening. Would anticipate repeat cardioversion on Monday or Tuesday if he does not convert spontaneously.  Continue IV diuresis.  Co Sign: Hillis Range, MD 03/05/2021 3:08 PM

## 2021-03-05 NOTE — Consult Note (Addendum)
Pharmacy Review for Dofetilide (Tikosyn) Initiation  Admit Complaint: 60 y.o. male admitted 03/01/2021 with atrial fibrillation to be initiated on dofetilide.   Assessment:  Patient Exclusion Criteria: If any screening criteria checked as "Yes", then  patient  should NOT receive dofetilide until criteria item is corrected. If "Yes" please indicate correction plan.  YES  NO Patient  Exclusion Criteria Correction Plan  [x]  []  Baseline QTc interval is greater than or equal to 440 msec. IF above YES box checked dofetilide contraindicated unless patient has ICD; then may proceed if QTc 500-550 msec or with known ventricular conduction abnormalities may proceed with QTc 550-600 msec. QTc =  483 msec on 03/01/21 EKGs ordered. RN to communicate with MD if > cut-off  [x]  []  Magnesium level is less than 1.8 mEq/l : Last magnesium:  Lab Results  Component Value Date   MG 1.7 03/05/2021       IV magnesium sulfate 2 g x 1  []  [x]  Potassium level is less than 4 mEq/l : Last potassium:  Lab Results  Component Value Date   K 4.0 03/05/2021         []  [x]  Patient is known or suspected to have a digoxin level greater than 2 ng/ml: No results found for: DIGOXIN    []  [x]  Creatinine clearance less than 20 ml/min (calculated using Cockcroft-Gault, actual body weight and serum creatinine): Estimated Creatinine Clearance: 92.6 mL/min (by C-G formula based on SCr of 1.12 mg/dL).    [x]  []  Patient has received drugs known to prolong the QT intervals within the last 48 hours (phenothiazines, tricyclics or tetracyclic antidepressants, erythromycin, H-1 antihistamines, cisapride, fluoroquinolones, azithromycin). Drugs not listed above may have an, as yet, undetected potential to prolong the QT interval, updated information on QT prolonging agents is available at this website:QT prolonging agents Consider holding trazodone, hydroxyzine  []  [x]  Patient received a dose of hydrochlorothiazide (Oretic) alone or in  any combination including triamterene (Dyazide, Maxzide) in the last 48 hours.   []  [x]  Patient received a medication known to increase dofetilide plasma concentrations prior to initial dofetilide dose:  Trimethoprim (Primsol, Proloprim) in the last 36 hours Verapamil (Calan, Verelan) in the last 36 hours or a sustained release dose in the last 72 hours Megestrol (Megace) in the last 5 days  Cimetidine (Tagamet) in the last 6 hours Ketoconazole (Nizoral) in the last 24 hours Itraconazole (Sporanox) in the last 48 hours  Prochlorperazine (Compazine) in the last 36 hours    []  [x]  Patient is known to have a history of torsades de pointes; congenital or acquired long QT syndromes.   []  [x]  Patient has received a Class 1 antiarrhythmic with less than 2 half-lives since last dose. (Disopyramide, Quinidine, Procainamide, Lidocaine, Mexiletine, Flecainide, Propafenone)   []  [x]  Patient has received amiodarone therapy in the past 3 months or amiodarone level is greater than 0.3 ng/ml.    Patient has been appropriately anticoagulated with IV heparin / apixaban  Ordering provider was confirmed at if they are not listed on the [facility] Authorized Prescribers list.  Goal of Therapy: Follow renal function, electrolytes, potential drug interactions, and dose adjustment. Provide education and 1 week supply at discharge.  Plan:  [x]   Physician selected initial dose within range recommended for patients level of renal function - will monitor for response.  []   Physician selected initial dose outside of range recommended for patients level of renal function - will discuss if the dose should be altered at this time.  Select One Calculated CrCl  Dose q12h  [x]  > 60 ml/min 500 mcg  []  40-60 ml/min 250 mcg  []  20-40 ml/min 125 mcg   2. Follow up QTc after the first 5 doses, renal function, electrolytes (K & Mg) daily x 3 days, dose adjustment, success of initiation and facilitate 1 week  discharge supply as clinically indicated.  3. Initiate Tikosyn education video (Call and ask for video # [phone]).  4. Place Enrollment Form on the chart for discharge supply of dofetilide.   3:19 PM 03/05/2021

## 2021-03-05 NOTE — Progress Notes (Signed)
Notify Dr. Johney Frame of pt's QTc 532. Ok to start [employer].

## 2021-03-05 NOTE — TOC Transition Note (Addendum)
Transition of Care [facility]) - CM/SW Discharge Note   Patient Details  Name: Marc [name] MRN: [id number] Date of Birth: 14-Aug-1961  Transition of Care [facility]) CM/SW Contact:  Gildardo Griffes, LCSW Phone Number: 03/05/2021, 1:01 PM   Clinical Narrative:      CSW provided scale at bedside, patient is set up with [facility] RN through [facility]. Jason informed of dc. Wife to transport home at time of discharge on Monday 7/4.  No other discharge needs identified at this time.   Final next level of care: Home w Home Health Services Barriers to Discharge: No Barriers Identified   Patient Goals and CMS Choice Patient states their goals for this hospitalization and ongoing recovery are:: to go home CMS Medicare.gov Compare Post Acute Care list provided to:: Patient Choice offered to / list presented to : Patient  Discharge Placement                Patient to be transferred to facility by: taxi      Discharge Plan and Services                          HH Arranged: RN [facility] Agency: [facility] ([facility] Date HH Agency Contacted: 03/05/21 Time HH Agency Contacted: 1301 Representative spoke with at [facility] Agency: Barbara Cower  Social Determinants of Health (SDOH) Interventions     Readmission Risk Interventions No flowsheet data found.

## 2021-03-05 NOTE — Consult Note (Signed)
ANTICOAGULATION CONSULT NOTE   Pharmacy Consult for heparin Indication: chest pain/ACS  Patient Measurements: Heparin Dosing Weight: 105.3 kg  Labs: Recent Labs    03/02/21 1209 03/03/21 0534 03/03/21 0534 03/04/21 0549 03/05/21 0624  HGB  --  12.6*   < > 12.7* 13.7  HCT  --  36.9*  --  36.2* 39.7  PLT  --  248  --  235 267  APTT 75* 70*  --   --   --   HEPARINUNFRC  --  0.53  --  0.51 0.42  CREATININE  --  1.01  --  1.11 1.12   < > = values in this interval not displayed.     Estimated Creatinine Clearance: 92.6 mL/min (by C-G formula based on SCr of 1.12 mg/dL).   Medical History: Past Medical History:  Diagnosis Date   Anxiety    Chronic systolic heart failure (HCC)    a. 07/2013 Echo: Ejection fraction of 45-50% with possible inferior wall hypokinesis; b. 03/2018 Echo: EF 40-45%; c. 06/2020 Echo: EF 40-45%, glob HK. Mod LVH, gr1 DD. Nl PASP. Mild BAE. Triv MR. Mild AoV sclerosis. Mild Ao dil (41mm).   Coronary artery disease    Significant left main and Severe three-vessel coronary artery disease in December of 2014. He underwent CABG in January of 2015 with LIMA to LAD, sequential SVG to ramus/OM 2 and SVG to OM 3   Diabetes mellitus without complication (HCC)    Hyperlipidemia    Hypertension    Ischemic cardiomyopathy    a. 07/2013 Echo: EF 45-50%; b. 03/2018 Echo: EF 40-45%; c. 06/2020 Echo: EF 40-45%.   Medical non-compliance    MI (myocardial infarction) (HCC)    Obesity    PAD (peripheral artery disease) (HCC)    a. 04/2020 PTA of L PT.   PAF (paroxysmal atrial fibrillation) (HCC)    Tobacco abuse     Assessment: 60 y.o. male with history of CHF, CAD, HTN, diabetes, HLD, paroxysmal atrial fibrillation on Eliquis who presents to the emergency department with shortness of breath and bilateral lower extremity swelling. Pharmacy has been consulted for heparin dosing for ACS. Patient is now s/p CABG  Goal of Therapy:  Anti-Xa level 0.3-0.7 units/ml   Monitor  platelets by anticoagulation protocol: Yes   Plan:  Anti-Xa level is therapeutic: continue heparin infusion at 1600 units/hr until transition to apixaban is made Re-check Anti-Xa level  with AM labs tomorrow CTM with Daily CBC per protocol while on IV heparin  Lowella Bandy, [place] 03/05/2021 8:16 AM

## 2021-03-06 LAB — BASIC METABOLIC PANEL
Anion gap: 8 (ref 5–15)
BUN: 24 mg/dL — ABNORMAL HIGH (ref 8–23)
CO2: 28 mmol/L (ref 22–32)
Calcium: 9.9 mg/dL (ref 8.9–10.3)
Chloride: 98 mmol/L (ref 98–111)
Creatinine, Ser: 1.39 mg/dL — ABNORMAL HIGH (ref 0.61–1.24)
GFR, Estimated: 58 mL/min — ABNORMAL LOW (ref >60)
Glucose, Bld: 285 mg/dL — ABNORMAL HIGH (ref 70–99)
Potassium: 4.2 mmol/L (ref 3.5–5.1)
Sodium: 134 mmol/L — ABNORMAL LOW (ref 135–145)

## 2021-03-06 LAB — GLUCOSE, CAPILLARY
Glucose-Capillary: 272 mg/dL — ABNORMAL HIGH (ref 70–99)
Glucose-Capillary: 319 mg/dL — ABNORMAL HIGH (ref 70–99)
Glucose-Capillary: 360 mg/dL — ABNORMAL HIGH (ref 70–99)
Glucose-Capillary: 183 mg/dL — ABNORMAL HIGH (ref 70–99)
Glucose-Capillary: 171 mg/dL — ABNORMAL HIGH (ref 70–99)

## 2021-03-06 LAB — CBC
HCT: 38.8 % — ABNORMAL LOW (ref 39.0–52.0)
Hemoglobin: 13.5 g/dL (ref 13.0–17.0)
MCH: 31.8 pg (ref 26.0–34.0)
MCHC: 34.8 g/dL (ref 30.0–36.0)
MCV: 91.5 fL (ref 80.0–100.0)
Platelets: 253 10*3/uL (ref 150–400)
RBC: 4.24 MIL/uL (ref 4.22–5.81)
RDW: 13.7 % (ref 11.5–15.5)
WBC: 7.8 10*3/uL (ref 4.0–10.5)
nRBC: 0 % (ref 0.0–0.2)

## 2021-03-06 LAB — MAGNESIUM: Magnesium: 2.2 mg/dL (ref 1.7–2.4)

## 2021-03-06 MED ORDER — LORAZEPAM 0.5 MG PO TABS
0.5000 mg | ORAL_TABLET | Freq: Four times a day (QID) | ORAL | Status: DC | PRN
  Administered 2021-03-06 – 2021-03-07 (×3): 0.5 mg via ORAL
  Filled 2021-03-06 (×3): qty 1

## 2021-03-06 MED ORDER — FUROSEMIDE 10 MG/ML IJ SOLN
40.0000 mg | Freq: Every day | INTRAMUSCULAR | Status: DC
  Administered 2021-03-07 – 2021-03-08 (×2): 40 mg via INTRAVENOUS
  Filled 2021-03-06 (×2): qty 4

## 2021-03-06 NOTE — Progress Notes (Addendum)
Progress Note  Patient Name: Marc [name] Date of Encounter: 03/06/2021  Primary Cardiologist: Debbe Odea, MD  Subjective   Feels well this AM. Converted to sinus @ 0528.  Still w/ lower ext edema.   Inpatient Medications    Scheduled Meds:  apixaban  5 mg Oral BID   aspirin EC  81 mg Oral Daily   atorvastatin  80 mg Oral QPM   carvedilol  25 mg Oral BID   dapagliflozin propanediol  10 mg Oral Daily   dofetilide  500 mcg Oral BID   furosemide  40 mg Intravenous BID   insulin aspart  0-15 Units Subcutaneous TID AC & HS   insulin aspart  10 Units Subcutaneous TID WC   insulin glargine  30 Units Subcutaneous Daily   isosorbide-hydrALAZINE  1 tablet Oral TID   magnesium oxide  400 mg Oral Daily   potassium chloride  20 mEq Oral BID   sacubitril-valsartan  1 tablet Oral BID   sodium chloride flush  3 mL Intravenous Q12H   sodium chloride flush  3 mL Intravenous Q12H   Continuous Infusions:  sodium chloride     sodium chloride     sodium chloride     PRN Meds: sodium chloride, sodium chloride, sodium chloride, acetaminophen, diphenhydrAMINE, naphazoline-pheniramine, sodium chloride, sodium chloride flush, sodium chloride flush, zolpidem   Vital Signs    Vitals:   03/06/21 0543 03/06/21 0627 03/06/21 0825 03/06/21 1026  BP: (!) 115/54  (!) 151/90   Pulse: (!) 58  62   Resp: 18  19   Temp: 98.6 F (37 C)  98.1 F (36.7 C)   TempSrc: Oral  Oral   SpO2: 95%  98%   Weight:  118.1 kg  114.6 kg  Height:        Intake/Output Summary (Last 24 hours) at 03/06/2021 1032 Last data filed at 03/06/2021 1029 Gross per 24 hour  Intake 1860 ml  Output 3650 ml  Net -1790 ml   Filed Weights   03/05/21 0242 03/06/21 0627 03/06/21 1026  Weight: 116.4 kg 118.1 kg 114.6 kg    Physical Exam   GEN: Obese, in no acute distress.  HEENT: Grossly normal.  Neck: Supple, no JVD, carotid bruits, or masses. Cardiac: RRR, no murmurs, rubs, or gallops. No clubbing, cyanosis,  1+ bilat LE edema to mid calves.  Radials 2+, DP/PT 2+ and equal bilaterally. L radial/brachial cath sites w/o bleeding/bruit/hematoma. Respiratory:  Respirations regular and unlabored, clear to auscultation bilaterally. GI: Obese, soft, nontender, nondistended, BS + x 4. MS: no deformity or atrophy. Skin: warm and dry, no rash. Neuro:  Strength and sensation are intact. Psych: AAOx3.  Normal affect.  Labs    Chemistry Recent Labs  Lab 03/04/21 0549 03/05/21 0624 03/06/21 0624  NA 135 135 134*  K 3.7 4.0 4.2  CL 100 97* 98  CO2 27 27 28   GLUCOSE 296* 349* 285*  BUN 19 20 24*  CREATININE 1.11 1.12 1.39*  CALCIUM 9.5 10.0 9.9  GFRNONAA >60 >60 58*  ANIONGAP 8 11 8   Mg 2.2   Hematology Recent Labs  Lab 03/04/21 0549 03/05/21 0624 03/06/21 0624  WBC 8.4 8.6 7.8  RBC 3.96* 4.42 4.24  HGB 12.7* 13.7 13.5  HCT 36.2* 39.7 38.8*  MCV 91.4 89.8 91.5  MCH 32.1 31.0 31.8  MCHC 35.1 34.5 34.8  RDW 13.5 13.5 13.7  PLT 235 267 253    Cardiac Enzymes  Recent Labs  Lab 03/01/21  5053 03/01/21 0546 03/01/21 0809 03/01/21 1239 03/01/21 1642  TROPONINIHS 36* 190* 1,214* 3,688* 3,665*      BNP Recent Labs  Lab 03/01/21 0351  BNP 920.7*      Lipids  Lab Results  Component Value Date   CHOL 80 01/07/2021   HDL 26 (L) 01/07/2021   LDLCALC 40 01/07/2021   TRIG 69 01/07/2021   CHOLHDL 3.1 01/07/2021    HbA1c  Lab Results  Component Value Date   HGBA1C 7.8 (H) 01/07/2021    Radiology    -----------  Telemetry    Converted to sinus rhythm @ 0528 this AM.  Rates currently 60's to 70's.  QTc this AM is 510 in setting of RBBB.  QTc 2 hrs after first dose of tikosyn last night difficult to discern from tele b/c T waves impacted by underlying afib and baseline abnormalities - Personally Reviewed  ECG    RSR, 71, RBBB, inf infarct. QT 480, QRS 156, QTc 530, mQT 402 [480-0.5(156)]; mQTc 437 - Personally Reviewed  Cardiac Studies   2D Echocardiogram  06.28.2022   1. Left ventricular ejection fraction, by estimation, is 35 to 40%. The  left ventricle has moderately decreased function. Left ventricular  endocardial border not optimally defined to evaluate regional wall motion. There is moderate asymmetric left ventricular hypertrophy of the posterior segment. Left ventricular diastolic function could not be evaluated.   2. Right ventricular systolic function was not well visualized. The right ventricular size is not well visualized. Mildly increased right  ventricular wall thickness.   3. The mitral valve is degenerative. Unable to accurately assess mitral valve regurgitation.   4. The aortic valve is tricuspid. There is mild calcification of the  aortic valve. There is mild thickening of the aortic valve. Aortic valve regurgitation not well assessed. Mild to moderate aortic valve  sclerosis/calcification is present, without any  evidence of aortic stenosis.    Cardiac catheterization 7.1.2022        1.  Severe underlying left main and three-vessel coronary artery disease with patent LIMA to LAD and patent SVG to OM2/ramus.  Occluded SVG to OM 3.  The RCA is known to be small and nondominant and occluded proximally with some left-to-right collaterals. 2.  Left ventricular angiography was not performed.  EF was moderately reduced by echo. 3.  Right heart catheterization showed mildly elevated wedge pressure at 20 mmHg with a prominent V wave, mild pulmonary hypertension and moderately reduced cardiac output.   Recommendations: Recommend continuing medical therapy for coronary artery disease.  No good options to revascularize the OM 3 and LPL distributions given the need to stent the left main, mid left circumflex and 3 branches of the left circumflex.  Risks probably outweigh the benefit. The best option is to treat medically including optimizing heart failure and rhythm control. The patient is still volume overloaded.  Continue IV diuresis  for another day and likely switch to oral diuretic tomorrow. Resume heparin drip in 6 hours and can switch to Eliquis tomorrow morning if no bleeding issues from the cath site. _____________   Patient Profile     60 y.o. male with history of CAD s/p 4-vessel CABG in 2015 with LIMA to LAD, sequential SVG to ramus/OM2, and SVG to OM3, chronic combined systolic and diastolic CHF, ICM, persistent a-fib s/p DCCV on 02/23/21, PAD, DM, HTN, HLD, obesity, who was admitted 6/28 w/ acute CHF and recurrent AFib and noted to have NSTEMI w/ peak HsTrop of 3688.  Cath 7/1 w/ 3/4 patent grafts (occluded VG  OM3) and CTO of RCA  Med rx.  Assessment & Plan    1.  NSTEMI/CAD:   s/p prior CABG.  Peak HsTrop 3688.  Cath 7/1 w/ 3/4 patent grafts and CTO of RCA.  The VG  OM3 is occluded, which was felt to be chronic.  Trop elev felt to be 2/2 demand ischemia in setting of AFib and decompensated CHF w/ ongoing elevation of R heart pressures on cath. No chest pain or dyspnea this AM.  Cont asa, statin, ? blocker.  No P2Y12 inhibitor in setting of eliquis.  2.  Acute on chornic combined syst/diast CHF/ICM:  EF 35-40% by echo this admission.  PCWP on RHC 7/1.  Minus 1.4L overnight.  Wt initially recorded as up 1.7kg this AM, though this appears to be a bed scale wt and f/u standing scale wt returned @ 114.6 kg/253 lbs.  Prev dry wt around 245 lbs.  Still w/ lower ext edema on exam.  BUN/Creat beginning to rise - 24/1.39 this AM.  Bicarb only slightly higher @ 28 (from 27).  I'll reduce lasix to 40mg  IV daily as diuretic needs likely to drop in setting of entresto and farxiga.  Avoiding MRA in setting of prior h/o hyperK+ on spiro in past.  Cont ? blocker and bidil.  3.  Persistent Afib:  s/p DCCV 6/22 w/ recurrent Afib on admission.  Tikosyn initiated last night w/ conversion to sinus rhythm earlier this AM. F/u QTc in the 11 o'clock hr this AM.  Cont ? blocker and eliquis.  He understands that he will remain in the  hospital until completion of his sixth dose of tikosyn on Tuesday morning, 7/5.  K/Mg wnl.  4.  Essential HTN:  Transitioned to entresto yesterday w/ improved BPs.  Cont ? blocker. May be able to back off of bidil.  Follow today.  5.  HL:  cont high potency statin rx.  LDL 40.    6.  DMII:  A1c 7.8. Farxiga added 7/2.  7.  AKI: BUN/Creat up slightly in setting of diuresis w/ addition of entresto and farxiga.  Reducing lasix to 40mg  IV daily today.  Follow.  Signed, 9/2, NP  03/06/2021, 10:32 AM    For questions or updates, please contact   Please consult [email or web address] for contact info under Cardiology/STEMI.    I have seen, examined the patient, and reviewed the above assessment and plan.  Changes to above are made where necessary.  On exam, RRR.  He has converted to sinus with first dose of tikosyn.  We will need to follow qtc closely.  Given RBBB, ok to continue tikosyn as long as Qtc  < 550 msec. Will need to remain in hospital on telemetry until after Tuesday 03/08/21 am dose while loading tikosyn. Will need to follow up within 1 week post discharge with Dr Friday in the office for tikosyn follow-up.  Co Sign: 05/09/21, MD 03/06/2021 2:26 PM

## 2021-03-06 NOTE — Progress Notes (Signed)
Was notified of pt rhythm change to [employer] around 0530. EKG performed and placed on chart.

## 2021-03-06 NOTE — Plan of Care (Signed)

## 2021-03-06 NOTE — Progress Notes (Signed)
    QTc 2 hrs after tikosyn = 530 (in setting of RBBB - QRS 150)- stable from 12 lead ECG @ 0618.  Nicolasa Ducking, NP

## 2021-03-06 NOTE — Progress Notes (Signed)
PROGRESS NOTE    Marc [name]  EXH:[id number] DOB: 12-27-1960 DOA: 03/01/2021 PCP: [facility]   Chief complaint.  Shortness of breath. Brief Narrative:  Marc [name] is a 60 y.o. male with medical history significant for atrial fibrillation status post recent DC cardioversion, history of chronic systolic CHF last known LVEF of 40 to 45%, history of coronary artery disease status post CABG x4, hypertension, diabetes mellitus and dyslipidemia who presents to the emergency room for evaluation of shortness of breath and increased weight gain.  He is diagnosed with acute on chronic systolic congestive heart failure, he was started on IV Lasix.  He was seen by cardiology, peak troponin was 3688, heart cath on 7/1 showed multivessel disease.  Cardiology decided against further intervention, patient will be treated medically. Patient is also given dofetilide by cardiology since 7/2.  Atrial fibrillation has converted to sinus rhythm in the morning of 7/3.   Assessment & Plan:   Principal Problem:   Acute on chronic systolic CHF (congestive heart failure) (HCC) Active Problems:   Hypertension   CAD (coronary artery disease)   Type 2 diabetes mellitus without complication (HCC)   Atrial fibrillation (HCC)   Anxiety   Hypokalemia   NSTEMI (non-ST elevated myocardial infarction) (HCC)    #1.  Acute on chronic systolic congestive heart failure. Non-STEMI. Status post heart cath. Condition improving, renal function started worse today.  Cardiology is adjusting diuretics. Patient currently on Entresto, beta-blocker and Farxiga  2.  Paroxysmal atrial defibrillation. Patient has converted to sinus rhythm.  Discussed with Dr. Johney [name], patient will need to complete 6 doses of dofetilide before discharge.  Most likely discharge home on Tuesday. Continue Eliquis.  #3.  Type 2 diabetes with hyperglycemia Continue Lantus, scheduled NovoLog and sliding scale insulin.  Dose  increased yesterday, will continue to follow.    DVT prophylaxis: Eliquis Code Status: full Family Communication:  Disposition Plan:    Status is: Inpatient  Remains inpatient appropriate because:Inpatient level of care appropriate due to severity of illness  Dispo: The patient is from: Home              Anticipated d/c is to: Home              Patient currently is not medically stable to d/c.   Difficult to place patient No        I/O last 3 completed shifts: In: 2043 [P.O.:2040; I.V.:3] Out: 4000 [Urine:4000] Total I/O In: 540 [P.O.:480; Other:60] Out: 600 [Urine:600]     Consultants:  Cardiology  Procedures: None  Antimicrobials: None   Subjective: Patient doing much better today, he converted to sinus rhythm this morning.  Short of breath much improved.  No cough. No abdominal pain or nausea vomiting. No dysuria hematuria No fever or chills.  Objective: Vitals:   03/05/21 2227 03/06/21 0543 03/06/21 0627 03/06/21 0825  BP: 138/84 (!) 115/54  (!) 151/90  Pulse: 85 (!) 58  62  Resp:  18  19  Temp:  98.6 F (37 C)  98.1 F (36.7 C)  TempSrc:  Oral  Oral  SpO2:  95%  98%  Weight:   118.1 kg   Height:        Intake/Output Summary (Last 24 hours) at 03/06/2021 1019 Last data filed at 03/06/2021 0905 Gross per 24 hour  Intake 1620 ml  Output 3150 ml  Net -1530 ml   Filed Weights   03/04/21 0058 03/05/21 0242 03/06/21 0627  Weight:  117.9 kg 116.4 kg 118.1 kg    Examination:  General exam: Appears calm and comfortable  Respiratory system: Clear to auscultation. Respiratory effort normal. Cardiovascular system: S1 & S2 heard, RRR. No JVD, murmurs, rubs, gallops or clicks.  Gastrointestinal system: Abdomen is nondistended, soft and nontender. No organomegaly or masses felt. Normal bowel sounds heard. Central nervous system: Alert and oriented. No focal neurological deficits. Extremities: 1-2+ leg edema Skin: No rashes, lesions or  ulcers Psychiatry: Judgement and insight appear normal. Mood & affect appropriate.     Data Reviewed: I have personally reviewed following labs and imaging studies  CBC: Recent Labs  Lab 03/01/21 0351 03/02/21 0620 03/03/21 0534 03/04/21 0549 03/05/21 0624 03/06/21 0624  WBC 10.5 9.0 7.6 8.4 8.6 7.8  NEUTROABS 8.7*  --   --   --   --   --   HGB 10.9* 12.6* 12.6* 12.7* 13.7 13.5  HCT 31.8* 37.1* 36.9* 36.2* 39.7 38.8*  MCV 92.4 93.2 91.3 91.4 89.8 91.5  PLT 225 263 248 235 267 253   Basic Metabolic Panel: Recent Labs  Lab 03/01/21 0351 03/02/21 0620 03/03/21 0534 03/04/21 0549 03/05/21 0624 03/06/21 0624  NA 138 138 136 135 135 134*  K 3.1* 3.6 3.3* 3.7 4.0 4.2  CL 103 100 100 100 97* 98  CO2 26 28 27 27 27 28   GLUCOSE 167* 166* 263* 296* 349* 285*  BUN 16 20 20 19 20  24*  CREATININE 1.11 1.03 1.01 1.11 1.12 1.39*  CALCIUM 9.0 9.7 9.3 9.5 10.0 9.9  MG 1.8  --  1.9 1.7 1.7 2.2   GFR: Estimated Creatinine Clearance: 75.1 mL/min (A) (by C-G formula based on SCr of 1.39 mg/dL (H)). Liver Function Tests: No results for input(s): AST, ALT, ALKPHOS, BILITOT, PROT, ALBUMIN in the last 168 hours. No results for input(s): LIPASE, AMYLASE in the last 168 hours. No results for input(s): AMMONIA in the last 168 hours. Coagulation Profile: No results for input(s): INR, PROTIME in the last 168 hours. Cardiac Enzymes: No results for input(s): CKTOTAL, CKMB, CKMBINDEX, TROPONINI in the last 168 hours. BNP (last 3 results) No results for input(s): PROBNP in the last 8760 hours. HbA1C: No results for input(s): HGBA1C in the last 72 hours. CBG: Recent Labs  Lab 03/05/21 1126 03/05/21 1627 03/05/21 2109 03/06/21 0144 03/06/21 0826  GLUCAP 421* 174* 293* 272* 319*   Lipid Profile: No results for input(s): CHOL, HDL, LDLCALC, TRIG, CHOLHDL, LDLDIRECT in the last 72 hours. Thyroid Function Tests: No results for input(s): TSH, T4TOTAL, FREET4, T3FREE, THYROIDAB in the last  72 hours. Anemia Panel: No results for input(s): VITAMINB12, FOLATE, FERRITIN, TIBC, IRON, RETICCTPCT in the last 72 hours. Sepsis Labs: No results for input(s): PROCALCITON, LATICACIDVEN in the last 168 hours.  Recent Results (from the past 240 hour(s))  Resp Panel by RT-PCR (Flu A&B, Covid)     Status: None   Collection Time: 03/01/21  4:29 AM  Result Value Ref Range Status   SARS Coronavirus 2 by RT PCR NEGATIVE NEGATIVE Final    Comment: (NOTE) SARS-CoV-2 target nucleic acids are NOT DETECTED.  The SARS-CoV-2 RNA is generally detectable in upper respiratory specimens during the acute phase of infection. The lowest concentration of SARS-CoV-2 viral copies this assay can detect is 138 copies/mL. A negative result does not preclude SARS-Cov-2 infection and should not be used as the sole basis for treatment or other patient management decisions. A negative result may occur with  improper specimen collection/handling, submission  of specimen other than nasopharyngeal swab, presence of viral mutation(s) within the areas targeted by this assay, and inadequate number of viral copies(<138 copies/mL). A negative result must be combined with clinical observations, patient history, and epidemiological information. The expected result is Negative.  Fact Sheet for Patients:  BloggerCourse.com  Fact Sheet for Healthcare Providers:  SeriousBroker.it  This test is no t yet approved or cleared by the [place] FDA and  has been authorized for detection and/or diagnosis of SARS-CoV-2 by FDA under an Emergency Use Authorization (EUA). This EUA will remain  in effect (meaning this test can be used) for the duration of the COVID-19 declaration under Section 564(b)(1) of the Act, 21 U.S.C.section 360bbb-3(b)(1), unless the authorization is terminated  or revoked sooner.       Influenza A by PCR NEGATIVE NEGATIVE Final   Influenza B by PCR  NEGATIVE NEGATIVE Final    Comment: (NOTE) The Xpert Xpress SARS-CoV-2/FLU/RSV plus assay is intended as an aid in the diagnosis of influenza from Nasopharyngeal swab specimens and should not be used as a sole basis for treatment. Nasal washings and aspirates are unacceptable for Xpert Xpress SARS-CoV-2/FLU/RSV testing.  Fact Sheet for Patients: BloggerCourse.com  Fact Sheet for Healthcare Providers: SeriousBroker.it  This test is not yet approved or cleared by the [place] FDA and has been authorized for detection and/or diagnosis of SARS-CoV-2 by FDA under an Emergency Use Authorization (EUA). This EUA will remain in effect (meaning this test can be used) for the duration of the COVID-19 declaration under Section 564(b)(1) of the Act, 21 U.S.C. section 360bbb-3(b)(1), unless the authorization is terminated or revoked.  Performed at [facility], [address] [place], [place] [address]          Radiology Studies: CARDIAC CATHETERIZATION  Result Date: 03/04/2021  Ost RCA to Prox RCA lesion is 100% stenosed.  Mid LM to Dist LM lesion is 90% stenosed.  Ost LAD to Prox LAD lesion is 100% stenosed.  Ost Cx to Prox Cx lesion is 50% stenosed.  Mid Cx to Dist Cx lesion is 60% stenosed.  3rd Mrg lesion is 90% stenosed.  1st LPL lesion is 90% stenosed.  2nd LPL lesion is 90% stenosed.  SVG graft was visualized by angiography.  Prox Graft to Mid Graft lesion is 30% stenosed.  Origin to Prox Graft lesion is 100% stenosed.  LIMA graft was visualized by angiography and is normal in caliber.  The graft exhibits no disease.  Dist LAD lesion is 60% stenosed.  Mid LAD lesion is 70% stenosed.  1.  Severe underlying left main and three-vessel coronary artery disease with patent LIMA to LAD and patent SVG to OM2/ramus.  Occluded SVG to OM 3.  The RCA is known to be small and nondominant and occluded proximally with some  left-to-right collaterals. 2.  Left ventricular angiography was not performed.  EF was moderately reduced by echo. 3.  Right heart catheterization showed mildly elevated wedge pressure at 20 mmHg with a prominent V wave, mild pulmonary hypertension and moderately reduced cardiac output. Recommendations: Recommend continuing medical therapy for coronary artery disease.  No good options to revascularize the OM 3 and LPL distributions given the need to stent the left main, mid left circumflex and 3 branches of the left circumflex.  Risks probably outweigh the benefit. The best option is to treat medically including optimizing heart failure and rhythm control. The patient is still volume overloaded.  Continue IV diuresis for another day and  likely switch to oral diuretic tomorrow. Resume heparin drip in 6 hours and can switch to Eliquis tomorrow morning if no bleeding issues from the cath site.        Scheduled Meds:  apixaban  5 mg Oral BID   aspirin EC  81 mg Oral Daily   atorvastatin  80 mg Oral QPM   carvedilol  25 mg Oral BID   dapagliflozin propanediol  10 mg Oral Daily   dofetilide  500 mcg Oral BID   furosemide  40 mg Intravenous BID   insulin aspart  0-15 Units Subcutaneous TID AC & HS   insulin aspart  10 Units Subcutaneous TID WC   insulin glargine  30 Units Subcutaneous Daily   isosorbide-hydrALAZINE  1 tablet Oral TID   magnesium oxide  400 mg Oral Daily   potassium chloride  20 mEq Oral BID   sacubitril-valsartan  1 tablet Oral BID   sodium chloride flush  3 mL Intravenous Q12H   sodium chloride flush  3 mL Intravenous Q12H   Continuous Infusions:  sodium chloride     sodium chloride     sodium chloride       LOS: 5 days    Time spent: 27 minutes    Marrion Coy, MD [facility]   To contact the attending provider between 7A-7P or the covering provider during after hours 7P-7A, please log into the web site [email or web address] and access using universal [place]  password for that web site. If you do not have the password, please call the hospital operator.  03/06/2021, 10:19 AM

## 2021-03-07 DIAGNOSIS — I509 Heart failure, unspecified: Secondary | ICD-10-CM

## 2021-03-07 LAB — CBC WITH DIFFERENTIAL/PLATELET
Abs Immature Granulocytes: 0.02 10*3/uL (ref 0.00–0.07)
Basophils Absolute: 0.1 10*3/uL (ref 0.0–0.1)
Basophils Relative: 1 %
Eosinophils Absolute: 0.5 10*3/uL (ref 0.0–0.5)
Eosinophils Relative: 6 %
HCT: 39.9 % (ref 39.0–52.0)
Hemoglobin: 13.8 g/dL (ref 13.0–17.0)
Immature Granulocytes: 0 %
Lymphocytes Relative: 14 %
Lymphs Abs: 1.1 10*3/uL (ref 0.7–4.0)
MCH: 31.7 pg (ref 26.0–34.0)
MCHC: 34.6 g/dL (ref 30.0–36.0)
MCV: 91.5 fL (ref 80.0–100.0)
Monocytes Absolute: 0.7 10*3/uL (ref 0.1–1.0)
Monocytes Relative: 9 %
Neutro Abs: 5.7 10*3/uL (ref 1.7–7.7)
Neutrophils Relative %: 70 %
Platelets: 248 10*3/uL (ref 150–400)
RBC: 4.36 MIL/uL (ref 4.22–5.81)
RDW: 13.4 % (ref 11.5–15.5)
WBC: 8.1 10*3/uL (ref 4.0–10.5)
nRBC: 0 % (ref 0.0–0.2)

## 2021-03-07 LAB — BASIC METABOLIC PANEL
Anion gap: 9 (ref 5–15)
BUN: 30 mg/dL — ABNORMAL HIGH (ref 8–23)
CO2: 25 mmol/L (ref 22–32)
Calcium: 9.7 mg/dL (ref 8.9–10.3)
Chloride: 100 mmol/L (ref 98–111)
Creatinine, Ser: 1.3 mg/dL — ABNORMAL HIGH (ref 0.61–1.24)
GFR, Estimated: >60 mL/min (ref >60)
Glucose, Bld: 232 mg/dL — ABNORMAL HIGH (ref 70–99)
Potassium: 4.2 mmol/L (ref 3.5–5.1)
Sodium: 134 mmol/L — ABNORMAL LOW (ref 135–145)

## 2021-03-07 LAB — GLUCOSE, CAPILLARY
Glucose-Capillary: 339 mg/dL — ABNORMAL HIGH (ref 70–99)
Glucose-Capillary: 258 mg/dL — ABNORMAL HIGH (ref 70–99)
Glucose-Capillary: 256 mg/dL — ABNORMAL HIGH (ref 70–99)
Glucose-Capillary: 236 mg/dL — ABNORMAL HIGH (ref 70–99)

## 2021-03-07 LAB — MAGNESIUM: Magnesium: 2.1 mg/dL (ref 1.7–2.4)

## 2021-03-07 NOTE — Progress Notes (Signed)
Progress Note  Patient Name: Marc [name] Date of Encounter: 03/07/2021  Primary Cardiologist: Debbe Odea, MD  Subjective   No chest pain or sob.  Maintaining sinus rhythm.  Bored.  Inpatient Medications    Scheduled Meds:  apixaban  5 mg Oral BID   aspirin EC  81 mg Oral Daily   atorvastatin  80 mg Oral QPM   carvedilol  25 mg Oral BID   dapagliflozin propanediol  10 mg Oral Daily   dofetilide  500 mcg Oral BID   furosemide  40 mg Intravenous Daily   insulin aspart  0-15 Units Subcutaneous TID AC & HS   insulin aspart  10 Units Subcutaneous TID WC   insulin glargine  30 Units Subcutaneous Daily   isosorbide-hydrALAZINE  1 tablet Oral TID   magnesium oxide  400 mg Oral Daily   potassium chloride  20 mEq Oral BID   sacubitril-valsartan  1 tablet Oral BID   sodium chloride flush  3 mL Intravenous Q12H   sodium chloride flush  3 mL Intravenous Q12H   Continuous Infusions:  sodium chloride     sodium chloride     sodium chloride     PRN Meds: sodium chloride, sodium chloride, sodium chloride, acetaminophen, diphenhydrAMINE, LORazepam, naphazoline-pheniramine, sodium chloride, sodium chloride flush, sodium chloride flush, zolpidem   Vital Signs    Vitals:   03/06/21 1958 03/07/21 0505 03/07/21 0742 03/07/21 0828  BP: 110/66 110/71  137/76  Pulse: (!) 59 62  65  Resp:  16  20  Temp: 97.9 F (36.6 C) 97.6 F (36.4 C)  97.9 F (36.6 C)  TempSrc: Oral Oral    SpO2: 95% 95%  97%  Weight:  116.9 kg 115.4 kg   Height:        Intake/Output Summary (Last 24 hours) at 03/07/2021 0928 Last data filed at 03/07/2021 0744 Gross per 24 hour  Intake 2230 ml  Output 2150 ml  Net 80 ml   Filed Weights   03/06/21 1026 03/07/21 0505 03/07/21 0742  Weight: 114.6 kg 116.9 kg 115.4 kg    Physical Exam   GEN: Well nourished, well developed, in no acute distress.  HEENT: Grossly normal.  Neck: Supple, no JVD, carotid bruits, or masses. Cardiac: RRR, no murmurs,  rubs, or gallops. No clubbing, cyanosis, 1+ bilat LE edema to mid calves.  Radials 2+, DP/PT 2+ and equal bilaterally. L radial/brachial cath sites w/o bleeding/bruit/hematoma. Respiratory:  Respirations regular and unlabored, clear to auscultation bilaterally. GI: Soft, nontender, nondistended, BS + x 4. MS: no deformity or atrophy. Skin: warm and dry, no rash. Neuro:  Strength and sensation are intact. Psych: AAOx3.  Normal affect.  Labs    Chemistry Recent Labs  Lab 03/05/21 0624 03/06/21 0624 03/07/21 0534  NA 135 134* 134*  K 4.0 4.2 4.2  CL 97* 98 100  CO2 27 28 25   GLUCOSE 349* 285* 232*  BUN 20 24* 30*  CREATININE 1.12 1.39* 1.30*  CALCIUM 10.0 9.9 9.7  GFRNONAA >60 58* >60  ANIONGAP 11 8 9      Hematology Recent Labs  Lab 03/05/21 0624 03/06/21 0624 03/07/21 0534  WBC 8.6 7.8 8.1  RBC 4.42 4.24 4.36  HGB 13.7 13.5 13.8  HCT 39.7 38.8* 39.9  MCV 89.8 91.5 91.5  MCH 31.0 31.8 31.7  MCHC 34.5 34.8 34.6  RDW 13.5 13.7 13.4  PLT 267 253 248    Cardiac Enzymes  Recent Labs  Lab 03/01/21 0351 03/01/21  0546 03/01/21 0809 03/01/21 1239 03/01/21 1642  TROPONINIHS 36* 190* 1,214* 3,688* 3,665*      BNP Recent Labs  Lab 03/01/21 0351  BNP 920.7*    Lipids  Lab Results  Component Value Date   CHOL 80 01/07/2021   HDL 26 (L) 01/07/2021   LDLCALC 40 01/07/2021   TRIG 69 01/07/2021   CHOLHDL 3.1 01/07/2021    HbA1c  Lab Results  Component Value Date   HGBA1C 7.8 (H) 01/07/2021    Radiology    --------------------  Telemetry    RSR, 60-70. QTc 510 - Personally Reviewed  Cardiac Studies   2D Echocardiogram 06.28.2022   1. Left ventricular ejection fraction, by estimation, is 35 to 40%. The  left ventricle has moderately decreased function. Left ventricular  endocardial border not optimally defined to evaluate regional wall motion. There is moderate asymmetric left ventricular hypertrophy of the posterior segment. Left ventricular  diastolic function could not be evaluated.   2. Right ventricular systolic function was not well visualized. The right ventricular size is not well visualized. Mildly increased right  ventricular wall thickness.   3. The mitral valve is degenerative. Unable to accurately assess mitral valve regurgitation.   4. The aortic valve is tricuspid. There is mild calcification of the  aortic valve. There is mild thickening of the aortic valve. Aortic valve regurgitation not well assessed. Mild to moderate aortic valve  sclerosis/calcification is present, without any  evidence of aortic stenosis.    Cardiac catheterization 7.1.2022        1.  Severe underlying left main and three-vessel coronary artery disease with patent LIMA to LAD and patent SVG to OM2/ramus.  Occluded SVG to OM 3.  The RCA is known to be small and nondominant and occluded proximally with some left-to-right collaterals. 2.  Left ventricular angiography was not performed.  EF was moderately reduced by echo. 3.  Right heart catheterization showed mildly elevated wedge pressure at 20 mmHg with a prominent V wave, mild pulmonary hypertension and moderately reduced cardiac output.   Recommendations: Recommend continuing medical therapy for coronary artery disease.  No good options to revascularize the OM 3 and LPL distributions given the need to stent the left main, mid left circumflex and 3 branches of the left circumflex.  Risks probably outweigh the benefit. The best option is to treat medically including optimizing heart failure and rhythm control. The patient is still volume overloaded.  Continue IV diuresis for another day and likely switch to oral diuretic tomorrow. Resume heparin drip in 6 hours and can switch to Eliquis tomorrow morning if no bleeding issues from the cath site. _____________     Patient Profile     60 y.o. male with history of CAD s/p 4-vessel CABG in 2015 with LIMA to LAD, sequential SVG to ramus/OM2, and  SVG to OM3, chronic combined systolic and diastolic CHF, ICM, persistent a-fib s/p DCCV on 02/23/21, PAD, DM, HTN, HLD, obesity, who was admitted 6/28 w/ acute CHF and recurrent AFib and noted to have NSTEMI w/ peak HsTrop of 3688.  Cath 7/1 w/ 3/4 patent grafts (occluded VG  OM3) and CTO of RCA  Med rx.  Assessment & Plan    1.  NSTEMI/CAD:  s/p prior CABG.  Cath 7/1 w/ 3/4 patent grafts and CTO of RCA.  The VG  OM3 is occluded, which was felt to be chronic.  Trop elev felt to be 2/2 demand ischemia in setting of AFib and decompensated CHF w/  ongoing elevation of R heart pressures on cath.  No chest pain or dyspnea.  Continue aspirin, statin, and beta-blocker.  No P2 Y 12 inhibitor in the setting of Eliquis.  2.  Acute on chronic combined syst/diast CHF/ICM:  EF 35-40% by echo this admission.  PCWP on RHC 7/1.  Net +30 mL yesterday.  Weight recorded at 115.4 kg this morning, up to 0.8 kg from yesterday.  Still with lower extremity edema.  Breathing stable.  Continue IV Lasix.  Creatinine stable at 1.30 compared to yesterday (1.39).  Continue beta-blocker, BiDil, Entresto, and Farxiga.  No MRA in the setting of prior hyperkalemia on spironolactone.  3.  Persistent atrial fibrillation: Status post cardioversion February 23, 2021 with recurrent atrial fibrillation on this admission.  Tikosyn therapy initiated on the evening of July 2 with subsequent conversion to sinus rhythm on the morning of July 3.  QTC is stable at 510 ms in the setting of right bundle branch block.  Continue beta-blocker, Eliquis, and current Tikosyn dose.  He understands that he will remain in the hospital at least until after his 6 Tikosyn dose tomorrow morning.  Potassium and magnesium within normal limits.  4.  Essential hypertension: Stable.  5.  Hyperlipidemia: Continue high potency statin therapy.  LDL 40.  6.  Type 2 diabetes mellitus: A1c 7.8.  7.  Acute kidney injury: Creatinine rose to 1.39 on July 3 in the setting of  IV Lasix, Farxiga, and Entresto.  Creatinine 1.3 this morning on Lasix once daily.  Continue to follow.  Signed, Nicolasa Ducking, NP  03/07/2021, 9:28 AM    For questions or updates, please contact   Please consult [email or web address] for contact info under Cardiology/STEMI.

## 2021-03-07 NOTE — Plan of Care (Signed)

## 2021-03-07 NOTE — Progress Notes (Signed)
PROGRESS NOTE    Marc [name]  YYQ:[id number] DOB: 1961-05-13 DOA: 03/01/2021 PCP: [facility]   Chief complaint shortness of breath. Brief Narrative:  Marc [name] is a 60 y.o. male with medical history significant for atrial fibrillation status post recent DC cardioversion, history of chronic systolic CHF last known LVEF of 40 to 45%, history of coronary artery disease status post CABG x4, hypertension, diabetes mellitus and dyslipidemia who presents to the emergency room for evaluation of shortness of breath and increased weight gain.  He is diagnosed with acute on chronic systolic congestive heart failure, he was started on IV Lasix.  He was seen by cardiology, peak troponin was 3688, heart cath on 7/1 showed multivessel disease.  Cardiology decided against further intervention, patient will be treated medically. Patient is also given dofetilide by cardiology since 7/2.  Atrial fibrillation has converted to sinus rhythm in the morning of 7/3.   Assessment & Plan:   Principal Problem:   Acute on chronic systolic CHF (congestive heart failure) (HCC) Active Problems:   Hypertension   CAD (coronary artery disease)   Type 2 diabetes mellitus without complication (HCC)   Atrial fibrillation (HCC)   Anxiety   Hypokalemia   NSTEMI (non-ST elevated myocardial infarction) (HCC)  #1.  Acute on chronic systolic congestive heart failure. Non-STEMI. Status post heart cath. Patient currently still receiving reduced IV Lasix twice a day.  Renal function is more stable today. Continue [name], and beta-blocker for systolic congestive heart failure.  2.  Paroxysmal atrial fibrillation. Currently on Eliquis, will continue.  #3.  Type 2 diabetes with hyperglycemia. Increase insulin dose yesterday.  Continue to follow.   DVT prophylaxis: Eliquis Code Status: full Family Communication:  Disposition Plan:    Status is: Inpatient  Remains inpatient  appropriate because:Inpatient level of care appropriate due to severity of illness  Dispo: The patient is from: Home              Anticipated d/c is to: Home              Patient currently is not medically stable to d/c.   Difficult to place patient No        I/O last 3 completed shifts: In: 2770 [P.O.:2640; I.V.:10; Other:120] Out: 2800 [Urine:2800] Total I/O In: 480 [P.O.:480] Out: 250 [Urine:250]     Consultants:  cardiology  Procedures: None  Antimicrobials:None   Subjective: Patient doing well today, no significant short of breath.  He could not sleep well last night due to in hospital, he did not feel short of breath. No chest pain palpitation. No abdominal pain nausea vomiting. No dysuria hematuria.  Objective: Vitals:   03/06/21 1958 03/07/21 0505 03/07/21 0742 03/07/21 0828  BP: 110/66 110/71  137/76  Pulse: (!) 59 62  65  Resp:  16  20  Temp: 97.9 F (36.6 C) 97.6 F (36.4 C)  97.9 F (36.6 C)  TempSrc: Oral Oral    SpO2: 95% 95%  97%  Weight:  116.9 kg 115.4 kg   Height:        Intake/Output Summary (Last 24 hours) at 03/07/2021 1101 Last data filed at 03/07/2021 1055 Gross per 24 hour  Intake 1990 ml  Output 1650 ml  Net 340 ml   Filed Weights   03/06/21 1026 03/07/21 0505 03/07/21 0742  Weight: 114.6 kg 116.9 kg 115.4 kg    Examination:  General exam: Appears calm and comfortable  Respiratory system: Clear to auscultation.  Respiratory effort normal. Cardiovascular system: S1 & S2 heard, RRR. No JVD, murmurs, rubs, gallops or clicks. Gastrointestinal system: Abdomen is nondistended, soft and nontender. No organomegaly or masses felt. Normal bowel sounds heard. Central nervous system: Alert and oriented. No focal neurological deficits. Extremities: 2+ leg edema Skin: No rashes, lesions or ulcers Psychiatry: Judgement and insight appear normal. Mood & affect appropriate.     Data Reviewed: I have personally reviewed following labs and  imaging studies  CBC: Recent Labs  Lab 03/01/21 0351 03/02/21 0620 03/03/21 0534 03/04/21 0549 03/05/21 0624 03/06/21 0624 03/07/21 0534  WBC 10.5   < > 7.6 8.4 8.6 7.8 8.1  NEUTROABS 8.7*  --   --   --   --   --  5.7  HGB 10.9*   < > 12.6* 12.7* 13.7 13.5 13.8  HCT 31.8*   < > 36.9* 36.2* 39.7 38.8* 39.9  MCV 92.4   < > 91.3 91.4 89.8 91.5 91.5  PLT 225   < > 248 235 267 253 248   < > = values in this interval not displayed.   Basic Metabolic Panel: Recent Labs  Lab 03/03/21 0534 03/04/21 0549 03/05/21 0624 03/06/21 0624 03/07/21 0534  NA 136 135 135 134* 134*  K 3.3* 3.7 4.0 4.2 4.2  CL 100 100 97* 98 100  CO2 27 27 27 28 25   GLUCOSE 263* 296* 349* 285* 232*  BUN 20 19 20  24* 30*  CREATININE 1.01 1.11 1.12 1.39* 1.30*  CALCIUM 9.3 9.5 10.0 9.9 9.7  MG 1.9 1.7 1.7 2.2 2.1   GFR: Estimated Creatinine Clearance: 79.4 mL/min (A) (by C-G formula based on SCr of 1.3 mg/dL (H)). Liver Function Tests: No results for input(s): AST, ALT, ALKPHOS, BILITOT, PROT, ALBUMIN in the last 168 hours. No results for input(s): LIPASE, AMYLASE in the last 168 hours. No results for input(s): AMMONIA in the last 168 hours. Coagulation Profile: No results for input(s): INR, PROTIME in the last 168 hours. Cardiac Enzymes: No results for input(s): CKTOTAL, CKMB, CKMBINDEX, TROPONINI in the last 168 hours. BNP (last 3 results) No results for input(s): PROBNP in the last 8760 hours. HbA1C: No results for input(s): HGBA1C in the last 72 hours. CBG: Recent Labs  Lab 03/06/21 0826 03/06/21 1206 03/06/21 1701 03/06/21 2127 03/07/21 0829  GLUCAP 319* 360* 183* 171* 339*   Lipid Profile: No results for input(s): CHOL, HDL, LDLCALC, TRIG, CHOLHDL, LDLDIRECT in the last 72 hours. Thyroid Function Tests: No results for input(s): TSH, T4TOTAL, FREET4, T3FREE, THYROIDAB in the last 72 hours. Anemia Panel: No results for input(s): VITAMINB12, FOLATE, FERRITIN, TIBC, IRON, RETICCTPCT in  the last 72 hours. Sepsis Labs: No results for input(s): PROCALCITON, LATICACIDVEN in the last 168 hours.  Recent Results (from the past 240 hour(s))  Resp Panel by RT-PCR (Flu A&B, Covid)     Status: None   Collection Time: 03/01/21  4:29 AM  Result Value Ref Range Status   SARS Coronavirus 2 by RT PCR NEGATIVE NEGATIVE Final    Comment: (NOTE) SARS-CoV-2 target nucleic acids are NOT DETECTED.  The SARS-CoV-2 RNA is generally detectable in upper respiratory specimens during the acute phase of infection. The lowest concentration of SARS-CoV-2 viral copies this assay can detect is 138 copies/mL. A negative result does not preclude SARS-Cov-2 infection and should not be used as the sole basis for treatment or other patient management decisions. A negative result may occur with  improper specimen collection/handling, submission of specimen other  than nasopharyngeal swab, presence of viral mutation(s) within the areas targeted by this assay, and inadequate number of viral copies(<138 copies/mL). A negative result must be combined with clinical observations, patient history, and epidemiological information. The expected result is Negative.  Fact Sheet for Patients:  BloggerCourse.com  Fact Sheet for Healthcare Providers:  SeriousBroker.it  This test is no t yet approved or cleared by the [place] FDA and  has been authorized for detection and/or diagnosis of SARS-CoV-2 by FDA under an Emergency Use Authorization (EUA). This EUA will remain  in effect (meaning this test can be used) for the duration of the COVID-19 declaration under Section 564(b)(1) of the Act, 21 U.S.C.section 360bbb-3(b)(1), unless the authorization is terminated  or revoked sooner.       Influenza A by PCR NEGATIVE NEGATIVE Final   Influenza B by PCR NEGATIVE NEGATIVE Final    Comment: (NOTE) The Xpert Xpress SARS-CoV-2/FLU/RSV plus assay is intended as  an aid in the diagnosis of influenza from Nasopharyngeal swab specimens and should not be used as a sole basis for treatment. Nasal washings and aspirates are unacceptable for Xpert Xpress SARS-CoV-2/FLU/RSV testing.  Fact Sheet for Patients: BloggerCourse.com  Fact Sheet for Healthcare Providers: SeriousBroker.it  This test is not yet approved or cleared by the [place] FDA and has been authorized for detection and/or diagnosis of SARS-CoV-2 by FDA under an Emergency Use Authorization (EUA). This EUA will remain in effect (meaning this test can be used) for the duration of the COVID-19 declaration under Section 564(b)(1) of the Act, 21 U.S.C. section 360bbb-3(b)(1), unless the authorization is terminated or revoked.  Performed at [facility], [address], [place], [place] [address]          Radiology Studies: No results found.      Scheduled Meds:  apixaban  5 mg Oral BID   aspirin EC  81 mg Oral Daily   atorvastatin  80 mg Oral QPM   carvedilol  25 mg Oral BID   dapagliflozin propanediol  10 mg Oral Daily   dofetilide  500 mcg Oral BID   furosemide  40 mg Intravenous Daily   insulin aspart  0-15 Units Subcutaneous TID AC & HS   insulin aspart  10 Units Subcutaneous TID WC   insulin glargine  30 Units Subcutaneous Daily   isosorbide-hydrALAZINE  1 tablet Oral TID   magnesium oxide  400 mg Oral Daily   potassium chloride  20 mEq Oral BID   sacubitril-valsartan  1 tablet Oral BID   sodium chloride flush  3 mL Intravenous Q12H   sodium chloride flush  3 mL Intravenous Q12H   Continuous Infusions:  sodium chloride     sodium chloride     sodium chloride       LOS: 6 days    Time spent: 26 minutes    Marrion Coy, MD [facility]   To contact the attending provider between 7A-7P or the covering provider during after hours 7P-7A, please log into the web site [email or web address] and  access using universal [place] password for that web site. If you do not have the password, please call the hospital operator.  03/07/2021, 11:01 AM

## 2021-03-08 ENCOUNTER — Other Ambulatory Visit: Payer: Self-pay | Admitting: Physician Assistant

## 2021-03-08 DIAGNOSIS — I4819 Other persistent atrial fibrillation: Secondary | ICD-10-CM

## 2021-03-08 LAB — GLUCOSE, CAPILLARY
Glucose-Capillary: 281 mg/dL — ABNORMAL HIGH (ref 70–99)
Glucose-Capillary: 307 mg/dL — ABNORMAL HIGH (ref 70–99)

## 2021-03-08 LAB — BASIC METABOLIC PANEL
Anion gap: 6 (ref 5–15)
BUN: 37 mg/dL — ABNORMAL HIGH (ref 8–23)
CO2: 27 mmol/L (ref 22–32)
Calcium: 10 mg/dL (ref 8.9–10.3)
Chloride: 103 mmol/L (ref 98–111)
Creatinine, Ser: 1.35 mg/dL — ABNORMAL HIGH (ref 0.61–1.24)
GFR, Estimated: 60 mL/min — ABNORMAL LOW (ref >60)
Glucose, Bld: 245 mg/dL — ABNORMAL HIGH (ref 70–99)
Potassium: 4.7 mmol/L (ref 3.5–5.1)
Sodium: 136 mmol/L (ref 135–145)

## 2021-03-08 LAB — MAGNESIUM: Magnesium: 2.6 mg/dL — ABNORMAL HIGH (ref 1.7–2.4)

## 2021-03-08 MED ORDER — FUROSEMIDE 40 MG PO TABS: 40.0000 mg | ORAL_TABLET | Freq: Every day | ORAL | 0 refills | Status: DC

## 2021-03-08 MED ORDER — DOFETILIDE 500 MCG PO CAPS: 500.0000 ug | ORAL_CAPSULE | Freq: Two times a day (BID) | ORAL | 1 refills | Status: DC

## 2021-03-08 MED ORDER — DOFETILIDE 500 MCG PO CAPS: 500.0000 ug | ORAL_CAPSULE | Freq: Two times a day (BID) | ORAL | 0 refills | Status: DC

## 2021-03-08 MED ORDER — SACUBITRIL-VALSARTAN 49-51 MG PO TABS: 1.0000 | ORAL_TABLET | Freq: Two times a day (BID) | ORAL | 0 refills | Status: DC

## 2021-03-08 MED ORDER — FUROSEMIDE 40 MG PO TABS: 40.0000 mg | ORAL_TABLET | Freq: Every day | ORAL | Status: DC

## 2021-03-08 MED ORDER — INSULIN ASPART 100 UNIT/ML IJ SOLN: 10.0000 [IU] | Freq: Three times a day (TID) | INTRAMUSCULAR | 0 refills | Status: DC

## 2021-03-08 MED ORDER — INSULIN GLARGINE 100 UNIT/ML ~~LOC~~ SOLN
40.0000 [IU] | Freq: Every day | SUBCUTANEOUS | Status: DC
  Filled 2021-03-08: qty 0.4

## 2021-03-08 MED ORDER — ASPIRIN 81 MG PO TBEC: 81.0000 mg | DELAYED_RELEASE_TABLET | Freq: Every day | ORAL | 0 refills | Status: DC

## 2021-03-08 MED ORDER — DAPAGLIFLOZIN PROPANEDIOL 10 MG PO TABS
10.0000 mg | ORAL_TABLET | Freq: Every day | ORAL | 0 refills | Status: AC
Start: 2021-03-08 — End: ?

## 2021-03-08 NOTE — Discharge Instructions (Addendum)
You have an appointment set up with the Atrial Fibrillation Clinic on 03/15/21 at 10:00AM.   Multiple studies have shown that being followed by a dedicated atrial fibrillation clinic in addition to the standard care you receive from your other physicians improves health. We believe that enrollment in the atrial fibrillation clinic will allow [place] to better care for you.   The phone number to the Atrial Fibrillation Clinic is [phone].  The clinic is staffed Monday through Friday from 8:30am to 5pm.  Parking Directions: The clinic is located in the Heart and Vascular Building connected to [facility]. 1)From [address] turn on to [address] and go to the 3rd entrance  (Heart and Vascular entrance) on the right. 2)Look to the right for Heart &Vascular Parking Garage. 3)A code for the entrance is required and the PARKING CODE for your July 12th visit is 3342 - call the clinic if questions.   4)Take the elevators to the 1st floor. Registration is in the room with the glass walls at the end of the hallway.  If you have any trouble parking or locating the clinic, please don't hesitate to call [phone].   -----------------  You have an appointment 03/18/2021 with your primary cardiologist, Dr. Azucena Cecil in the [place] office at 8AM.  You have an appointment on 03/23/2021 with the heart failure clinic, Clarisa Kindred at 1:30 PM.  You also have a visit with Dr. Lalla Brothers of electrophysiology on 04/13/2021 at 11:20 AM.  This will be in the [facility] office and in the same location as your primary cardiologist.  ----      You have received care from [facility] during this hospital stay and we look forward to continuing to provide you with excellent care in our office settings after you've left the hospital.  In order to assure a smoother transition to home following your discharge from the hospital, we will likely have you see one of our nurse  practitioners or physician assistants within a few weeks of discharge.  Our advanced practice providers work closely with your physician in order to address all of your heart's needs in a timely manner.  More information about all of our providers may be found here: [email or web address]   Please plan to bring all of your prescriptions to your follow-up appointment and don't hesitate to contact us with questions or concerns.  [facility] - [phone] [address] [place], [place] [address] --------------------------------------   Catheterization site care Refer to this sheet in the next few weeks. These instructions provide you with information on caring for yourself after your procedure. Your caregiver may also give you more specific instructions. Your treatment has been planned according to current medical practices, but problems sometimes occur. Call your caregiver if you have any problems or questions after your procedure. HOME CARE INSTRUCTIONS You may shower the day after the procedure. Remove the bandage (dressing) and gently wash the site with plain soap and water. Gently pat the site dry.  Do not apply powder or lotion to the site.  Do not submerge the affected site in water for 3 to 5 days.  Inspect the site at least twice daily.  Do not flex or bend the affected arm for 24 hours.  No lifting over 5 pounds (2.3 kg) for 5 days after your procedure.  What to expect: Any bruising will usually fade within 1 to 2 weeks.  Blood that collects in the tissue (hematoma) may  be painful to the touch. It should usually decrease in size and tenderness within 1 to 2 weeks.  SEEK IMMEDIATE MEDICAL CARE IF: You have unusual pain at the radial site.  You have redness, warmth, swelling, or pain at the radial site.  You have drainage (other than a small amount of blood on the dressing).  You have chills.  You have  a fever or persistent symptoms for more than 72 hours.  You have a fever and your symptoms suddenly get worse.  Your arm becomes pale, cool, tingly, or numb.  You have heavy bleeding from the site. Hold pressure on the site.    -----------------------------     10 Habits of Highly Healthy People   wants to help you get well and stay well.  Live a longer, healthier life by practicing healthy habits every day.  1.  Visit your primary care provider regularly. 2.  Make time for family and friends.  Healthy relationships are important. 3.  Take medications as directed by your provider. 4.  Maintain a healthy weight and a trim waistline. 5.  Eat healthy meals and snacks, rich in fruits, vegetables, whole grains, and lean proteins. 6.  Get moving every day - aim for 150 minutes of moderate physical activity each week. 7.  Don't smoke. 8.  Avoid alcohol or drink in moderation. 9.  Manage stress through meditation or mindful relaxation. 10.  Get seven to nine hours of quality sleep each night.  Want more information on healthy habits?  To learn more about these and other healthy habits, visit [email or web address].[email or web address].

## 2021-03-08 NOTE — Progress Notes (Signed)
Tikosyn BID prescribed at discharge. He will need to pick this medication up for his evening dose. Provided information that he should call the office if 3 missed doses. Pharmacist verified that this medication is available at [facility]. Call placed to pharmacy myself to verify they received my script and do indeed carry Tikosyn.

## 2021-03-08 NOTE — Discharge Summary (Signed)
Physician Discharge Summary  Patient ID: Marc [name] MRN: [id number] DOB/AGE: 62/06/1961 62 y.o.  Admit date: 03/01/2021 Discharge date: 03/08/2021  Admission Diagnoses:  Discharge Diagnoses:  Principal Problem:   Acute on chronic systolic CHF (congestive heart failure) (HCC) Active Problems:   Hypertension   CAD (coronary artery disease)   Type 2 diabetes mellitus without complication (HCC)   Atrial fibrillation (HCC)   Anxiety   Hypokalemia   NSTEMI (non-ST elevated myocardial infarction) Marc [name])   Discharged Condition: good  [name] Course:  Marc [name] is a 60 y.o. male with medical history significant for atrial fibrillation status post recent DC cardioversion, history of chronic systolic CHF last known LVEF of 40 to 45%, history of coronary artery disease status post CABG x4, hypertension, diabetes mellitus and dyslipidemia who presents to the emergency room for evaluation of shortness of breath and increased weight gain.  He is diagnosed with acute on chronic systolic congestive heart failure, he was started on IV Lasix.  He was seen by cardiology, peak troponin was 3688, heart cath on 7/1 showed multivessel disease.  Cardiology decided against further intervention, patient will be treated medically. Patient is also given dofetilide by cardiology since 7/2.  Atrial fibrillation has converted to sinus rhythm in the morning of 7/3.  #1.  Acute on chronic systolic congestive heart failure. Non-STEMI. Status post heart cath. Patient condition has been stable, discussed with Dr. Okey Dupre, patient has been diuresed enough, he is medically stable to be discharged. Continue Marc [name], and beta-blocker for systolic congestive heart failure. Patient will be followed by PCP and cardiology in the near future.   2.  Paroxysmal atrial fibrillation. Currently on Eliquis, will continue. We have confirmed that the patient dofetilide is available in the pharmacy.   #3.  Type 2  diabetes with hyperglycemia. Increase insulin dose yesterday.  Continue to follow.  Consults: cardiology  Significant Diagnostic Studies:  Heart cath: Ost RCA to Prox RCA lesion is 100% stenosed. Mid LM to Dist LM lesion is 90% stenosed. Ost LAD to Prox LAD lesion is 100% stenosed. Ost Cx to Prox Cx lesion is 50% stenosed. Mid Cx to Dist Cx lesion is 60% stenosed. 3rd Mrg lesion is 90% stenosed. 1st LPL lesion is 90% stenosed. 2nd LPL lesion is 90% stenosed. SVG graft was visualized by angiography. Prox Graft to Mid Graft lesion is 30% stenosed. Origin to Prox Graft lesion is 100% stenosed. LIMA graft was visualized by angiography and is normal in caliber. The graft exhibits no disease. Dist LAD lesion is 60% stenosed. Mid LAD lesion is 70% stenosed.   1.  Severe underlying left main and three-vessel coronary artery disease with patent LIMA to LAD and patent SVG to OM2/ramus.  Occluded SVG to OM 3.  The RCA is known to be small and nondominant and occluded proximally with some left-to-right collaterals. 2.  Left ventricular angiography was not performed.  EF was moderately reduced by echo. 3.  Right heart catheterization showed mildly elevated wedge pressure at 20 mmHg with a prominent V wave, mild pulmonary hypertension and moderately reduced cardiac output.   Recommendations: Recommend continuing medical therapy for coronary artery disease.  No good options to revascularize the OM 3 and LPL distributions given the need to stent the left main, mid left circumflex and 3 branches of the left circumflex.  Risks probably outweigh the benefit. The best option is to treat medically including optimizing heart failure and rhythm control. The patient is still volume overloaded.  Continue  IV diuresis for another day and likely switch to oral diuretic tomorrow. Resume heparin drip in 6 hours and can switch to Eliquis tomorrow morning if no bleeding issues from the cath site.   Echo: 1. Left  ventricular ejection fraction, by estimation, is 35 to 40%. The left ventricle has moderately decreased function. Left ventricular endocardial border not optimally defined to evaluate regional wall motion. There is moderate asymmetric left ventricular hypertrophy of the posterior segment. Left ventricular diastolic function could not be evaluated. 2. Right ventricular systolic function was not well visualized. The right ventricular size is not well visualized. Mildly increased right ventricular wall thickness. 3. The mitral valve is degenerative. Unable to accurately assess mitral valve regurgitation. 4. The aortic valve is tricuspid. There is mild calcification of the aortic valve. There is mild thickening of the aortic valve. Aortic valve regurgitation not well assessed. Mild to moderate aortic valve sclerosis/calcification is present, without any evidence of aortic stenosis.   Treatments: diuretics  Discharge Exam: Blood pressure 95/62, pulse 62, temperature 97.8 F (36.6 C), resp. rate 18, height 6\' 1"  (1.854 m), weight 117.3 kg, SpO2 98 %. General appearance: alert and cooperative Resp: clear to auscultation bilaterally Cardio: regular rate and rhythm, S1, S2 normal, no murmur, click, rub or gallop GI: soft, non-tender; bowel sounds normal; no masses,  no organomegaly Extremities: 2+ leg edema  Disposition: Discharge disposition: 01-Home or Self Care       Discharge Instructions     Diet - low sodium heart healthy   Complete by: As directed    Increase activity slowly   Complete by: As directed       Allergies as of 03/08/2021       Reactions   Spironolactone Other (See Comments)   HYPERKALEMIA        Medication List     STOP taking these medications    cloNIDine 0.3 MG tablet Commonly known as: CATAPRES   glipiZIDE 10 MG 24 hr tablet Commonly known as: GLUCOTROL XL   torsemide 20 MG tablet Commonly known as: DEMADEX       TAKE these medications     acetaminophen 500 MG tablet Commonly known as: TYLENOL Take 1,000 mg by mouth every 6 (six) hours as needed for moderate pain or headache.   apixaban 5 MG Tabs tablet Commonly known as: ELIQUIS Take 1 tablet (5 mg total) by mouth 2 (two) times daily.   aspirin 81 MG EC tablet Take 1 tablet (81 mg total) by mouth daily. Swallow whole. Start taking on: March 09, 2021   atorvastatin 80 MG tablet Commonly known as: LIPITOR Take 80 mg by mouth every evening.   calcium carbonate 500 MG chewable tablet Commonly known as: TUMS - dosed in mg elemental calcium Chew 500 mg by mouth daily as needed for indigestion or heartburn.   carvedilol 25 MG tablet Commonly known as: COREG Take 25 mg by mouth 2 (two) times daily.   dapagliflozin propanediol 10 MG Tabs tablet Commonly known as: FARXIGA Take 1 tablet (10 mg total) by mouth daily.   dofetilide 500 MCG capsule Commonly known as: Tikosyn Take 1 capsule (500 mcg total) by mouth 2 (two) times daily. It is important you do not miss any doses of this medication. Call the office if you miss more than 3 doses.   furosemide 40 MG tablet Commonly known as: LASIX Take 1 tablet (40 mg total) by mouth daily. Start taking on: March 09, 2021   hydrOXYzine 50 MG  tablet Commonly known as: ATARAX/VISTARIL Take 50 mg by mouth 4 (four) times daily as needed for anxiety.   insulin aspart 100 UNIT/ML injection Commonly known as: novoLOG Inject 10 Units into the skin 3 (three) times daily with meals.   isosorbide-hydrALAZINE 20-37.5 MG tablet Commonly known as: BiDil Take 1 tablet by mouth 3 (three) times daily.   Lantus SoloStar 100 UNIT/ML Solostar Pen Generic drug: insulin glargine Inject 42 Units into the skin daily.   potassium chloride 10 MEQ tablet Commonly known as: KLOR-CON Take 10 mEq by mouth daily.   sacubitril-valsartan 49-51 MG Commonly known as: ENTRESTO Take 1 tablet by mouth 2 (two) times daily.   VISINE-A OP Place 1  drop into both eyes daily as needed (allergies).        Follow-up Information     [facility] Follow up in 1 week(s).   Specialty: General Practice Contact information: [address] [place] [address] [phone]         Debbe Odea, MD Follow up in 1 week(s).   Specialties: Cardiology, Radiology Contact information: [address] [place] [address] [phone]                35 minutes Signed: Marrion Coy 03/08/2021, 2:57 PM

## 2021-03-08 NOTE — Progress Notes (Signed)
[facility], confirmed they have generic dofetilide 500 mcg tabs in stock. Called @08 :56 on 7/5.    9/5, PharmD, BCPS

## 2021-03-08 NOTE — TOC Transition Note (Signed)
Transition of Care [facility]) - CM/SW Discharge Note   Patient Details  Name: Marc [name] MRN: [id number] Date of Birth: 09/14/1960  Transition of Care [facility]) CM/SW Contact:  Gildardo Griffes, LCSW Phone Number: 03/08/2021, 3:07 PM   Clinical Narrative:     CSW provided scale at bedside, patient is set up with [facility] RN through [facility]. Jason informed of dc. Family to transport home.  No other discharge needs identified at this time.      Final next level of care: Home w Home Health Services Barriers to Discharge: No Barriers Identified   Patient Goals and CMS Choice Patient states their goals for this hospitalization and ongoing recovery are:: to go home CMS Medicare.gov Compare Post Acute Care list provided to:: Patient Choice offered to / list presented to : Patient  Discharge Placement                Patient to be transferred to facility by: family   Patient and family notified of of transfer: 03/08/21  Discharge Plan and Services                          HH Arranged: RN [facility] Agency: [facility] ([facility] Date HH Agency Contacted: 03/05/21 Time HH Agency Contacted: 1301 Representative spoke with at [facility] Agency: Barbara Cower  Social Determinants of Health (SDOH) Interventions     Readmission Risk Interventions No flowsheet data found.

## 2021-03-08 NOTE — Progress Notes (Addendum)
Progress Note  Patient Name: GERY SABEDRA Date of Encounter: 03/08/2021  PCP:  [facility]  Cardiologist:  Debbe Odea, MD  Advanced Practice Provider:  No care team member to display Electrophysiologist:  None    Subjective   No chest pain or shortness of breath.  He reports he feels back at his baseline.  He does report lower extremity edema but reports this is also at his baseline.    S/p R/LHC with plan for medical management - he denies all any pain or swelling at his catheterization arteriotomy sites.    We reviewed that he has started on Tikosyn this admission and cannot miss any doses of this medication with patient understanding.    An appointment has been scheduled for him in the atrial fibrillation clinic for 03/15/2021 and with Dr. Lalla Brothers in [place] for 04/13/2021.  We will also arrange for him to follow-up with his primary cardiologist at the [facility] office.  Inpatient Medications    Scheduled Meds:  apixaban  5 mg Oral BID   aspirin EC  81 mg Oral Daily   atorvastatin  80 mg Oral QPM   carvedilol  25 mg Oral BID   dapagliflozin propanediol  10 mg Oral Daily   dofetilide  500 mcg Oral BID   furosemide  40 mg Intravenous Daily   insulin aspart  0-15 Units Subcutaneous TID AC & HS   insulin aspart  10 Units Subcutaneous TID WC   [START ON 03/09/2021] insulin glargine  40 Units Subcutaneous Daily   isosorbide-hydrALAZINE  1 tablet Oral TID   magnesium oxide  400 mg Oral Daily   potassium chloride  20 mEq Oral BID   sacubitril-valsartan  1 tablet Oral BID   sodium chloride flush  3 mL Intravenous Q12H   sodium chloride flush  3 mL Intravenous Q12H   Continuous Infusions:  sodium chloride     sodium chloride     sodium chloride     PRN Meds: sodium chloride, sodium chloride, sodium chloride, acetaminophen, diphenhydrAMINE, LORazepam, naphazoline-pheniramine, sodium chloride, sodium chloride  flush, sodium chloride flush, zolpidem   Vital Signs    Vitals:   03/08/21 0428 03/08/21 0500 03/08/21 0758 03/08/21 1201  BP: 116/79  (!) 129/91 95/62  Pulse: 69  63 62  Resp: 20  18 18   Temp: 98.3 F (36.8 C)  (!) 97.4 F (36.3 C) 97.8 F (36.6 C)  TempSrc: Oral     SpO2: 97%  97% 98%  Weight:  117.3 kg    Height:        Intake/Output Summary (Last 24 hours) at 03/08/2021 1304 Last data filed at 03/08/2021 0820 Gross per 24 hour  Intake 126 ml  Output 1850 ml  Net -1724 ml   Last 3 Weights 03/08/2021 03/07/2021 03/07/2021  Weight (lbs) 258 lb 8 oz 254 lb 6.6 oz 257 lb 11.5 oz  Weight (kg) 117.255 kg 115.4 kg 116.9 kg      Telemetry    SR with known RBBB- Personally Reviewed  ECG    Sinus rhythm with first-degree AV block, known right bundle branch block, Q waves in the inferior leads,Qtc 538- Personally Reviewed  Physical Exam   GEN: No acute distress.  Seated in the arm chair next to the bed. Neck: no JVD Cardiac: RRR, no murmurs, rubs, or gallops.  Left radial/brachial catheterization sites without bleeding, erythema, bruit, or hematoma. Respiratory: Clear to auscultation bilaterally. GI:  Soft, nontender, non-distended  MS: 1+ bilateral LEE; No deformity. Neuro:  Nonfocal  Psych: Normal affect   Labs    High Sensitivity Troponin:   Recent Labs  Lab 03/01/21 0351 03/01/21 0546 03/01/21 0809 03/01/21 1239 03/01/21 1642  TROPONINIHS 36* 190* 1,214* 3,688* 3,665*      Cardiac EnzymesNo results for input(s): TROPONINI in the last 168 hours. No results for input(s): TROPIPOC in the last 168 hours.   Chemistry Recent Labs  Lab 03/06/21 0624 03/07/21 0534 03/08/21 0612  NA 134* 134* 136  K 4.2 4.2 4.7  CL 98 100 103  CO2 28 25 27   GLUCOSE 285* 232* 245*  BUN 24* 30* 37*  CREATININE 1.39* 1.30* 1.35*  CALCIUM 9.9 9.7 10.0  GFRNONAA 58* >60 60*  ANIONGAP 8 9 6      Hematology Recent Labs  Lab 03/05/21 0624 03/06/21 0624 03/07/21 0534  WBC 8.6  7.8 8.1  RBC 4.42 4.24 4.36  HGB 13.7 13.5 13.8  HCT 39.7 38.8* 39.9  MCV 89.8 91.5 91.5  MCH 31.0 31.8 31.7  MCHC 34.5 34.8 34.6  RDW 13.5 13.7 13.4  PLT 267 253 248    BNPNo results for input(s): BNP, PROBNP in the last 168 hours.   DDimer No results for input(s): DDIMER in the last 168 hours.   Radiology    No results found.  Cardiac Studies   2D Echocardiogram 06.28.2022   1. Left ventricular ejection fraction, by estimation, is 35 to 40%. The  left ventricle has moderately decreased function. Left ventricular  endocardial border not optimally defined to evaluate regional wall motion. There is moderate asymmetric left ventricular hypertrophy of the posterior segment. Left ventricular diastolic function could not be evaluated.   2. Right ventricular systolic function was not well visualized. The right ventricular size is not well visualized. Mildly increased right  ventricular wall thickness.   3. The mitral valve is degenerative. Unable to accurately assess mitral valve regurgitation.   4. The aortic valve is tricuspid. There is mild calcification of the  aortic valve. There is mild thickening of the aortic valve. Aortic valve regurgitation not well assessed. Mild to moderate aortic valve  sclerosis/calcification is present, without any  evidence of aortic stenosis.    Cardiac catheterization 7.1.2022        1.  Severe underlying left main and three-vessel coronary artery disease with patent LIMA to LAD and patent SVG to OM2/ramus.  Occluded SVG to OM 3.  The RCA is known to be small and nondominant and occluded proximally with some left-to-right collaterals. 2.  Left ventricular angiography was not performed.  EF was moderately reduced by echo. 3.  Right heart catheterization showed mildly elevated wedge pressure at 20 mmHg with a prominent V wave, mild pulmonary hypertension and moderately reduced cardiac output.   Recommendations: Recommend continuing medical  therapy for coronary artery disease.  No good options to revascularize the OM 3 and LPL distributions given the need to stent the left main, mid left circumflex and 3 branches of the left circumflex.  Risks probably outweigh the benefit. The best option is to treat medically including optimizing heart failure and rhythm control. The patient is still volume overloaded.  Continue IV diuresis for another day and likely switch to oral diuretic tomorrow. Resume heparin drip in 6 hours and can switch to Eliquis tomorrow morning if no bleeding issues from the cath site. _____________   Patient Profile     60 y.o. male with history of  CAD s/p four-vessel CABG in 2015 with LIMA to LAD, sequential SVG to ramus/OM 2, and SVG to OM 3, chronic combined systolic and diastolic CHF, ischemic cardiomyopathy, persistent atrial fibrillation s/p DCCV on 02/23/2021, PAD, DM2, hypertension, hyperlipidemia, obesity, and who was admitted 6/28 with acute CHF and recurrent atrial fibrillation and noted to have non-STEMI with peak high-sensitivity troponin 3688.  Subsequent 03/04/2021 With 3/4 patent grafts (occluded VG-OM3) and CTO of RCA with recommendation for medical therapy.  Assessment & Plan    NSTEMI s/p prior CABG and recent 7/1 catheterization --No chest pain or shortness of breath.  Arteriotomy site stable and without pain or signs of infection.  S/p 7/1 catheterization with 3 of 4 patent grafts and CTO of RCA.  VG-OM 3 occluded, felt to be chronic.  High-sensitivity troponin elevation most likely 2/2 demand ischemia in the setting of atrial fibrillation and decompensated CHF with ongoing elevation of right heart pressures on catheterization.  Recommendation is for medical management.  Continue ASA, beta-blocker, statin.  No P2Y12 inhibitor in the setting of Eliquis.  Acute on chronic combined systolic and diastolic CHF/ICM --Denies shortness of breath and reports ongoing lower extremity edema.  EF 35 to 40% by echo.   PCWP 20 mmHg on right heart cath 7/1.  Ongoing lower extremity edema noted on exam though likely some component of dependent edema.  Diuresis also has been limited by renal function and soft BP with Cr still above that of baseline.  We will transition IV Lasix over to oral Lasix 40 mg daily.  Net -14.234 for admission and -1.4L for yesterday. Continue with beta-blocker, BiDil, Entresto, and Farxiga.  Previously, no MRA in the setting of prior hyperkalemia on spironolactone.  Persistent atrial fibrillation on Tikosyn --S/p 02/2021 cardioversion with recurrent atrial fibrillation this admission.  Tikosyn therapy initiated with evening dose 7/2 with subsequent conversion to SR on the morning of July 3.  He has received over 5 doses (currently at 6 doses) of Tikosyn with QTC most recently 538 in the setting of known right bundle branch block.  Electrolytes wnl. Tikosyn has been verified as available at [facility] clinic when the rx needs called into a pharmacy at discharge. At this time, plan is for discharge with Tikosyn BID. A follow-up appointment with the atrial fibrillation clinic in [place] has been scheduled for 7/12 (AVS instructions provided) with EP visit follow-up scheduled with Dr. Lalla Brothers in the [place] office for 8/10.  We will also arrange for follow-up with his primary cardiologist at [facility].   Essential hypertension --BP soft. Consider down-titration of antihypertensives if ongoing low BP to avoid prerenal AKI.  As above, we will transition IV Lasix to oral Lasix 40 mg daily.    HLD --Continue current statin.  LDL 40 and at goal.  DM2 --A1c 7.8. SSI. Per IM/PCP.  AKI --Cr increased to 1.39 s/p catheterization, currently 1.35. Cr 1.12 before admission. Recommend follow-up BMET within 1 to 2 weeks of discharge.  For questions or updates, please contact [facility] Please consult [email or web address] for contact info under        Signed, Lennon Alstrom, PA-C  03/08/2021,  1:04 PM

## 2021-03-08 NOTE — Progress Notes (Signed)
Inpatient Diabetes Program Recommendations  AACE/ADA: New Consensus Statement on Inpatient Glycemic Control (2015)  Target Ranges:  Prepandial:   less than 140 mg/dL      Peak postprandial:   less than 180 mg/dL (1-2 hours)      Critically ill patients:  140 - 180 mg/dL   Lab Results  Component Value Date   GLUCAP 281 (H) 03/08/2021   HGBA1C 7.8 (H) 01/07/2021    Review of Glycemic Control Results for Marc [name], Marc [name] (MRN [id number]) as of 03/08/2021 11:34  Ref. Range 03/07/2021 08:29 03/07/2021 11:42 03/07/2021 17:36 03/07/2021 21:48 03/08/2021 07:59  Glucose-Capillary Latest Ref Range: 70 - 99 mg/dL 982 (H) 641 (H) 583 (H) 236 (H) 281 (H)   Inpatient Diabetes Program Recommendations:   -Consider increase in Lantus to 40 units. Secure chat sent to Dr. Chipper Herb.  Thank you, Billy Fischer. Zannie Runkle, RN, MSN, CDE  Diabetes Coordinator Inpatient Glycemic Control Team Team Pager [phone] (8am-5pm) 03/08/2021 11:38 AM

## 2021-03-10 ENCOUNTER — Telehealth: Payer: Self-pay | Admitting: Cardiology

## 2021-03-10 ENCOUNTER — Ambulatory Visit: Payer: Medicaid Other | Admitting: Family

## 2021-03-10 NOTE — Telephone Encounter (Signed)
Called patient and spoke to him. He stated that he has been feeling very tired and weak since the medication changes post hospital admit. Patient stated he has been drinking plenty of fluids and does not feel dehydrated. His BP today was 95/55, HR was 60-65. I advised him if his BP was still below 110 before his evening medications to HOLD his Carvedilol.   I scheduled him with Dr. Azucena Cecil for tomorrow morning instead of next week for his follow up.  Patient was grateful for the call back.

## 2021-03-10 NOTE — Telephone Encounter (Signed)
Pt c/o BP issue: STAT if pt c/o blurred vision, one-sided weakness or slurred speech  1. What are your last 5 BP readings? 95/55  2. Are you having any other symptoms (ex. Dizziness, headache, blurred vision, passed out)? Very sluggish and weak  3. What is your BP issue? Low after recent hospitalization. Patient states that his medications were all changed while in hospital and patient has not felt well since

## 2021-03-11 ENCOUNTER — Ambulatory Visit (INDEPENDENT_AMBULATORY_CARE_PROVIDER_SITE_OTHER): Payer: Medicaid Other | Admitting: Cardiology

## 2021-03-11 ENCOUNTER — Encounter: Payer: Self-pay | Admitting: Cardiology

## 2021-03-11 ENCOUNTER — Other Ambulatory Visit: Payer: Self-pay

## 2021-03-11 VITALS — BP 110/62 | HR 61 | Ht 73.0 in | Wt 256.0 lb

## 2021-03-11 DIAGNOSIS — I2510 Atherosclerotic heart disease of native coronary artery without angina pectoris: Secondary | ICD-10-CM

## 2021-03-11 DIAGNOSIS — I4819 Other persistent atrial fibrillation: Secondary | ICD-10-CM

## 2021-03-11 DIAGNOSIS — I255 Ischemic cardiomyopathy: Secondary | ICD-10-CM

## 2021-03-11 NOTE — Progress Notes (Signed)
Cardiology Office Note:    Date:  03/11/2021   ID:  Marc [name], DOB 1961-07-16, MRN [id number]  PCP:  [facility]  [facility] Cardiologist:  Debbe Odea, MD  [facility] Electrophysiologist:  None   Referring MD: [facility], [facility]*   Chief Complaint  Patient presents with   Other    Follow up post cath. Patient c/o low BP. Meds reviewed verbally with patient.     History of Present Illness:    Marc [name] is a 60 y.o. male with a hx of CAD/CABG x 4 2015 ([place] to LAD SVG to Ramus /OM2, OM3) hypertension, hyperlipidemia, ICM EF 35-40%%, PAD, persistent A. Fib, former smoker x 40+who presents for follow-up.  Recently seen in the hospital due to CHF exacerbation and NSTEMI.  Underwent left heart cath 03/07/2021 showing patent LIMA to LAD, patent SVG to OM2/ramus.  Occluded SVG to OM 3.  Small nondominant RCA occluded proximally.  Medical management advised.  Seen by electrophysiology, started on Tikosyn for A. fib control.  Plan to follow-up with A. fib clinic.  GDMT include Coreg 25 twice daily, BiDil , Entresto 49/50.  Aspirin, Lipitor.  Lasix 40 mg daily.  Not on Aldactone due to CKD and hyperkalemia history.  Its feeling dizzy and lightheaded yesterday, check blood pressures with systolic in the 90s.  Reduce his Coreg, did not take Coreg this morning due to symptoms of low blood pressures.  Denies edema, has no other concerns at this time.  Denies palpitations.  Taking Tikosyn as prescribed.  Prior notes Echo 02/2021, EF 35 to 40%, aortic valve sclerosis, mild MR [facility] 03/07/2021 showing patent LIMA to LAD, patent SVG to OM2/ramus.  Occluded SVG to OM 3.  Small nondominant RCA occluded proximally. CAD/CABG x 4 2015 (Lima to LAD SVG to Ramus /OM2, OM3) Echo 06/2020 mild to moderately reduced EF, 40 to 45%. Echo 03/2018 EF 40 to 45%  Patient has history of nonhealing ulceration of the left foot, underwent a left great toe amputation with  subsequent wound.  He is being followed up by vascular surgery.  Noninvasive studies have shown noncompressible vessels on the right with ABIs 1.07.  Podiatry is planning resection of left foot wound to improve wound healing.   Past Medical History:  Diagnosis Date   Anxiety    Chronic systolic heart failure (HCC)    a. 07/2013 Echo: Ejection fraction of 45-50% with possible inferior wall hypokinesis; b. 03/2018 Echo: EF 40-45%; c. 06/2020 Echo: EF 40-45%, glob HK. Mod LVH, gr1 DD. Nl PASP. Mild BAE. Triv MR. Mild AoV sclerosis. Mild Ao dil (51mm).   Coronary artery disease    Significant left main and Severe three-vessel coronary artery disease in December of 2014. He underwent CABG in January of 2015 with LIMA to LAD, sequential SVG to ramus/OM 2 and SVG to OM 3   Diabetes mellitus without complication (HCC)    Hyperlipidemia    Hypertension    Ischemic cardiomyopathy    a. 07/2013 Echo: EF 45-50%; b. 03/2018 Echo: EF 40-45%; c. 06/2020 Echo: EF 40-45%.   Medical non-compliance    MI (myocardial infarction) (HCC)    Obesity    PAD (peripheral artery disease) (HCC)    a. 04/2020 PTA of L PT.   PAF (paroxysmal atrial fibrillation) (HCC)    Tobacco abuse     Past Surgical History:  Procedure Laterality Date   AMPUTATION Left 04/08/2020   Procedure: AMPUTATION RAY;  Surgeon: Rosetta Posner,  DPM;  Location: [facility] OR[facility];  Service: Podiatry;  Laterality: Left;   AMPUTATION Left 06/25/2020   Procedure: AMPUTATION RAY 1ST RAY PARTIAL LEFT;  Surgeon: Rosetta Posner, DPM;  Location: [facility];  Service: Podiatry;  Laterality: Left;   AMPUTATION TOE Right 02/29/2016   Procedure: AMPUTATION TOE;  Surgeon: Linus Galas, DPM;  Location: [facility];  Service: Podiatry;  Laterality: Right;  Great toe   CARDIAC CATHETERIZATION     [facility] no stent   CARDIOVERSION N/A 02/23/2021   Procedure: CARDIOVERSION;  Surgeon: Debbe Odea, MD;  Location: [facility] OR[facility];  Service: Cardiovascular;  Laterality: N/A;    CORONARY ARTERY BYPASS GRAFT N/A 09/16/2013   Procedure: CORONARY ARTERY BYPASS GRAFTING (CABG);  Surgeon: [name], MD;  Location: [facility] OR;  Service: Open Heart Surgery;  Laterality: N/A;  CABG x four, using left internal mammary artery and right leg greater saphenous vein   INTRAOPERATIVE TRANSESOPHAGEAL ECHOCARDIOGRAM N/A 09/16/2013   Procedure: INTRAOPERATIVE TRANSESOPHAGEAL ECHOCARDIOGRAM;  Surgeon: [name], MD;  Location: [facility] OR;  Service: Open Heart Surgery;  Laterality: N/A;   knee surgery Right    LASIK Bilateral    LOWER EXTREMITY ANGIOGRAPHY Left 04/07/2020   Procedure: Lower Extremity Angiography;  Surgeon: Renford Dills, MD;  Location: [facility];  Service: Cardiovascular;  Laterality: Left;   LOWER EXTREMITY ANGIOGRAPHY Left 04/09/2020   Procedure: Lower Extremity Angiography (Pedal Approach);  Surgeon: Renford Dills, MD;  Location: [facility];  Service: Cardiovascular;  Laterality: Left;   RIGHT/LEFT HEART CATH AND CORONARY/GRAFT ANGIOGRAPHY N/A 03/04/2021   Procedure: RIGHT/LEFT HEART CATH AND CORONARY/GRAFT ANGIOGRAPHY;  Surgeon: Iran Ouch, MD;  Location: [facility];  Service: Cardiovascular;  Laterality: N/A;    Current Medications: Current Meds  Medication Sig   acetaminophen (TYLENOL) 500 MG tablet Take 1,000 mg by mouth every 6 (six) hours as needed for moderate pain or headache.   apixaban (ELIQUIS) 5 MG TABS tablet Take 1 tablet (5 mg total) by mouth 2 (two) times daily.   aspirin EC 81 MG EC tablet Take 1 tablet (81 mg total) by mouth daily. Swallow whole.   atorvastatin (LIPITOR) 80 MG tablet Take 80 mg by mouth every evening.    calcium carbonate (TUMS - DOSED IN MG ELEMENTAL CALCIUM) 500 MG chewable tablet Chew 500 mg by mouth daily as needed for indigestion or heartburn.   carvedilol (COREG) 25 MG tablet Take 25 mg by mouth 2 (two) times daily.   dapagliflozin propanediol (FARXIGA) 10 MG TABS tablet Take 1 tablet (10  mg total) by mouth daily.   dofetilide (TIKOSYN) 500 MCG capsule Take 1 capsule (500 mcg total) by mouth 2 (two) times daily. It is important you do not miss any doses of this medication. Call the office if you miss 3 doses. Make sure you notify the office with plenty of time for any refills.   furosemide (LASIX) 40 MG tablet Take 1 tablet (40 mg total) by mouth daily.   hydrOXYzine (ATARAX/VISTARIL) 50 MG tablet Take 50 mg by mouth 4 (four) times daily as needed for anxiety.   insulin aspart (NOVOLOG) 100 UNIT/ML injection Inject 10 Units into the skin 3 (three) times daily with meals.   insulin glargine (LANTUS SOLOSTAR) 100 UNIT/ML Solostar Pen Inject 42 Units into the skin daily.   Naphazoline-Pheniramine (VISINE-A OP) Place 1 drop into both eyes daily as needed (allergies).   potassium chloride (KLOR-CON) 10 MEQ tablet Take 10 mEq by mouth  daily.   sacubitril-valsartan (ENTRESTO) 49-51 MG Take 1 tablet by mouth 2 (two) times daily.   [DISCONTINUED] isosorbide-hydrALAZINE (BIDIL) 20-37.5 MG tablet Take 1 tablet by mouth 3 (three) times daily.     Allergies:   Spironolactone   Social History   Socioeconomic History   Marital status: Divorced    Spouse name: Not on file   Number of children: Not on file   Years of education: Not on file   Highest education level: Not on file  Occupational History   Not on file  Tobacco Use   Smoking status: Former    Packs/day: 0.50    Years: 45.00    Pack years: 22.50    Types: Cigarettes    Quit date: 02/09/2021    Years since quitting: 0.0   Smokeless tobacco: Never   Tobacco comments:    trying to quit  Vaping Use   Vaping Use: Never used  Substance and Sexual Activity   Alcohol use: No    Alcohol/week: 0.0 standard drinks   Drug use: No   Sexual activity: Not on file  Other Topics Concern   Not on file  Social History Narrative   Not on file   Social Determinants of Health   Financial Resource Strain: Not on file  Food  Insecurity: Not on file  Transportation Needs: Not on file  Physical Activity: Not on file  Stress: Not on file  Social Connections: Not on file     Family History: The patient's family history includes Heart disease in his mother.  ROS:   Please see the history of present illness.     All other systems reviewed and are negative.  EKGs/Labs/Other Studies Reviewed:    The following studies were reviewed today:   EKG:  EKG is  ordered today.  The ekg ordered today demonstrates atrial fibrillation, right bundle branch block  Recent Labs: 01/06/2021: ALT 25 01/21/2021: TSH 1.130 03/01/2021: B Natriuretic Peptide 920.7 03/07/2021: Hemoglobin 13.8; Platelets 248 03/08/2021: BUN 37; Creatinine, Ser 1.35; Magnesium 2.6; Potassium 4.7; Sodium 136  Recent Lipid Panel    Component Value Date/Time   CHOL 80 01/07/2021 0303   CHOL 135 07/21/2013 0759   TRIG 69 01/07/2021 0303   TRIG 152 07/21/2013 0759   HDL 26 (L) 01/07/2021 0303   HDL 39 (L) 07/21/2013 0759   CHOLHDL 3.1 01/07/2021 0303   VLDL 14 01/07/2021 0303   VLDL 30 07/21/2013 0759   LDLCALC 40 01/07/2021 0303   LDLCALC 66 07/21/2013 0759     Risk Assessment/Calculations:      Physical Exam:    VS:  BP 110/62 (BP Location: Left Arm, Patient Position: Sitting, Cuff Size: Normal)   Pulse 61   Ht 6\' 1"  (1.854 m)   Wt 256 lb (116.1 kg)   SpO2 97%   BMI 33.78 kg/m     Wt Readings from Last 3 Encounters:  03/11/21 256 lb (116.1 kg)  03/08/21 258 lb 8 oz (117.3 kg)  02/23/21 258 lb (117 kg)     GEN:  Well nourished, well developed in no acute distress HEENT: Normal NECK: No JVD; No carotid bruits CARDIAC: RRR, no murmurs, rubs, gallops RESPIRATORY:  Clear to auscultation without rales, wheezing or rhonchi  ABDOMEN: Soft, non-tender, non-distended MUSCULOSKELETAL:  trace edema; No deformity  SKIN: Warm and dry NEUROLOGIC:  Alert and oriented x 3 PSYCHIATRIC:  Normal affect   ASSESSMENT:    1. Persistent atrial  fibrillation (HCC)   2. Coronary  artery disease involving native coronary artery of native heart without angina pectoris   3. Ischemic cardiomyopathy     PLAN:    In order of problems listed above:  Persistent A. fib, CHA2DS2-VASc of 4.  Maintaining sinus rhythm on Tikosyn. Continue Coreg, Eliquis.  Keep appointment with A. fib clinic. CAD, status post CABG x4.  Last left heart cath with SVG to OM 3.  Patent LIMA to LAD, patent SVG to ramus, OM 2.  Denies symptoms of chest pain.  Continue Eliquis, Lipitor.  Consider stopping aspirin at follow-up visit. History of ischemic cardiomyopathy, EF 35-40%.  Describes NYHA class II symptoms.  Appears euvolemic, clear lungs.  Continue Lasix 40 mg daily.  Due to low blood pressures, stop BiDil.  Continue Coreg 25 mg twice daily, continue Entresto 49-51 mg twice daily.  If BP improves or becomes elevated at follow-up visit, plan to titrate Entresto.   Follow-up in 2 months  Total encounter time 40 minutes  Greater than 50% was spent in counseling and coordination of care with the patient      Medication Adjustments/Labs and Tests Ordered: Current medicines are reviewed at length with the patient today.  Concerns regarding medicines are outlined above.  Orders Placed This Encounter  Procedures   EKG 12-Lead     No orders of the defined types were placed in this encounter.    Patient Instructions  Medication Instructions:   Your physician has recommended you make the following change in your medication:    STOP taking your   *If you need a refill on your cardiac medications before your next appointment, please call your pharmacy*   Lab Work: None ordered If you have labs (blood work) drawn today and your tests are completely normal, you will receive your results only by: MyChart Message (if you have MyChart) OR A paper copy in the mail If you have any lab test that is abnormal or we need to change your treatment, we will call you to  review the results.   Testing/Procedures: None ordered   Follow-Up: At [facility], you and your health needs are our priority.  As part of our continuing mission to provide you with exceptional heart care, we have created designated Provider Care Teams.  These Care Teams include your primary Cardiologist (physician) and Advanced Practice Providers (APPs -  Physician Assistants and Nurse Practitioners) who all work together to provide you with the care you need, when you need it.  We recommend signing up for the patient portal called "MyChart".  Sign up information is provided on this After Visit Summary.  MyChart is used to connect with patients for Virtual Visits (Telemedicine).  Patients are able to view lab/test results, encounter notes, upcoming appointments, etc.  Non-urgent messages can be sent to your provider as well.   To learn more about what you can do with MyChart, go to [email or web address].    Your next appointment:   2 month(s)  The format for your next appointment:   In Person  Provider:   Debbe Odea, MD   Other Instructions    Signed, Debbe Odea, MD  03/11/2021 1:03 PM     [facility]

## 2021-03-11 NOTE — Patient Instructions (Signed)
Medication Instructions:   Your physician has recommended you make the following change in your medication:    STOP taking your   *If you need a refill on your cardiac medications before your next appointment, please call your pharmacy*   Lab Work: None ordered If you have labs (blood work) drawn today and your tests are completely normal, you will receive your results only by: MyChart Message (if you have MyChart) OR A paper copy in the mail If you have any lab test that is abnormal or we need to change your treatment, we will call you to review the results.   Testing/Procedures: None ordered   Follow-Up: At [facility], you and your health needs are our priority.  As part of our continuing mission to provide you with exceptional heart care, we have created designated Provider Care Teams.  These Care Teams include your primary Cardiologist (physician) and Advanced Practice Providers (APPs -  Physician Assistants and Nurse Practitioners) who all work together to provide you with the care you need, when you need it.  We recommend signing up for the patient portal called "MyChart".  Sign up information is provided on this After Visit Summary.  MyChart is used to connect with patients for Virtual Visits (Telemedicine).  Patients are able to view lab/test results, encounter notes, upcoming appointments, etc.  Non-urgent messages can be sent to your provider as well.   To learn more about what you can do with MyChart, go to [email or web address].    Your next appointment:   2 month(s)  The format for your next appointment:   In Person  Provider:   Debbe Odea, MD   Other Instructions

## 2021-03-14 NOTE — Progress Notes (Signed)
Primary Care Physician: [facility], [facility] Primary Cardiologist: Dr Azucena Cecil Primary Electrophysiologist: Dr Lalla Brothers Referring Physician: Francis Dowse PA   Marc [name] is a 60 y.o. male with a history of chronic systolic CHF, CAD, HTN, DM, HLD, tobacco use, and atrial fibrillation who presents for consultation in the [facility]. Patient presented to the ED with symptoms of SOB and weight gain. He is diagnosed with acute on chronic systolic congestive heart failure and diuresed with IV Lasix. Peak troponin was 3688, heart cath on 7/1 showed multivessel disease, plan to treat medically. He was started on dofetilide 03/05/21 and converted to SR without DCCV. Patient is on Eliquis for a CHADS2VASC score of 4. He reports that he feels much better since leaving the hospital with less SOB. He reports he is able to walk up stairs which he was not able to before. He denies any bleeding issues on anticoagulation.   Today, he denies symptoms of palpitations, chest pain, shortness of breath, orthopnea, PND, lower extremity edema, dizziness, presyncope, syncope, snoring, daytime somnolence, bleeding, or neurologic sequela. The patient is tolerating medications without difficulties and is otherwise without complaint today.    Atrial Fibrillation Risk Factors:  he does not have symptoms or diagnosis of sleep apnea. he does not have a history of rheumatic fever.   he has a BMI of Body mass index is 32.9 kg/m.Marland Kitchen Filed Weights   03/15/21 1002  Weight: 113.1 kg    Family History  Problem Relation Age of Onset   Heart disease Mother      Atrial Fibrillation Management history:  Previous antiarrhythmic drugs: dofetilide  Previous cardioversions: 02/23/21 Previous ablations: none CHADS2VASC score: 4 Anticoagulation history: Eliquis   Past Medical History:  Diagnosis Date   Anxiety    Chronic systolic heart failure (HCC)    a. 07/2013 Echo:  Ejection fraction of 45-50% with possible inferior wall hypokinesis; b. 03/2018 Echo: EF 40-45%; c. 06/2020 Echo: EF 40-45%, glob HK. Mod LVH, gr1 DD. Nl PASP. Mild BAE. Triv MR. Mild AoV sclerosis. Mild Ao dil (78mm).   Coronary artery disease    Significant left main and Severe three-vessel coronary artery disease in December of 2014. He underwent CABG in January of 2015 with LIMA to LAD, sequential SVG to ramus/OM 2 and SVG to OM 3   Diabetes mellitus without complication (HCC)    Hyperlipidemia    Hypertension    Ischemic cardiomyopathy    a. 07/2013 Echo: EF 45-50%; b. 03/2018 Echo: EF 40-45%; c. 06/2020 Echo: EF 40-45%.   Medical non-compliance    MI (myocardial infarction) (HCC)    Obesity    PAD (peripheral artery disease) (HCC)    a. 04/2020 PTA of L PT.   PAF (paroxysmal atrial fibrillation) (HCC)    Tobacco abuse    Past Surgical History:  Procedure Laterality Date   AMPUTATION Left 04/08/2020   Procedure: AMPUTATION RAY;  Surgeon: Rosetta Posner, DPM;  Location: [facility] ORS;  Service: Podiatry;  Laterality: Left;   AMPUTATION Left 06/25/2020   Procedure: AMPUTATION RAY 1ST RAY PARTIAL LEFT;  Surgeon: Rosetta Posner, DPM;  Location: [facility];  Service: Podiatry;  Laterality: Left;   AMPUTATION TOE Right 02/29/2016   Procedure: AMPUTATION TOE;  Surgeon: Linus Galas, DPM;  Location: [facility] OR[facility];  Service: Podiatry;  Laterality: Right;  Great toe   CARDIAC CATHETERIZATION     [facility] no stent   CARDIOVERSION N/A 02/23/2021   Procedure: CARDIOVERSION;  Surgeon: Azucena Cecil,  Arlys John, MD;  Location: [facility] ORS;  Service: Cardiovascular;  Laterality: N/A;   CORONARY ARTERY BYPASS GRAFT N/A 09/16/2013   Procedure: CORONARY ARTERY BYPASS GRAFTING (CABG);  Surgeon: [name], MD;  Location: [facility] OR;  Service: Open Heart Surgery;  Laterality: N/A;  CABG x four, using left internal mammary artery and right leg greater saphenous vein   INTRAOPERATIVE TRANSESOPHAGEAL ECHOCARDIOGRAM N/A 09/16/2013    Procedure: INTRAOPERATIVE TRANSESOPHAGEAL ECHOCARDIOGRAM;  Surgeon: [name], MD;  Location: [facility] OR;  Service: Open Heart Surgery;  Laterality: N/A;   knee surgery Right    LASIK Bilateral    LOWER EXTREMITY ANGIOGRAPHY Left 04/07/2020   Procedure: Lower Extremity Angiography;  Surgeon: Renford Dills, MD;  Location: [facility] INVAS[facility] LAB;  Service: Cardiovascular;  Laterality: Left;   LOWER EXTREMITY ANGIOGRAPHY Left 04/09/2020   Procedure: Lower Extremity Angiography (Pedal Approach);  Surgeon: Renford Dills, MD;  Location: [facility];  Service: Cardiovascular;  Laterality: Left;   RIGHT/LEFT HEART CATH AND CORONARY/GRAFT ANGIOGRAPHY N/A 03/04/2021   Procedure: RIGHT/LEFT HEART CATH AND CORONARY/GRAFT ANGIOGRAPHY;  Surgeon: Iran Ouch, MD;  Location: [facility] CV LAB;  Service: Cardiovascular;  Laterality: N/A;    Current Outpatient Medications  Medication Sig Dispense Refill   acetaminophen (TYLENOL) 500 MG tablet Take 1,000 mg by mouth every 6 (six) hours as needed for moderate pain or headache.     apixaban (ELIQUIS) 5 MG TABS tablet Take 1 tablet (5 mg total) by mouth 2 (two) times daily. 60 tablet 1   aspirin EC 81 MG EC tablet Take 1 tablet (81 mg total) by mouth daily. Swallow whole. 30 tablet 0   atorvastatin (LIPITOR) 80 MG tablet Take 80 mg by mouth every evening.   2   calcium carbonate (TUMS - DOSED IN MG ELEMENTAL CALCIUM) 500 MG chewable tablet Chew 500 mg by mouth daily as needed for indigestion or heartburn.     carvedilol (COREG) 25 MG tablet Take 25 mg by mouth 2 (two) times daily.     dapagliflozin propanediol (FARXIGA) 10 MG TABS tablet Take 1 tablet (10 mg total) by mouth daily. 30 tablet 0   dofetilide (TIKOSYN) 500 MCG capsule Take 1 capsule (500 mcg total) by mouth 2 (two) times daily. It is important you do not miss any doses of this medication. Call the office if you miss 3 doses. Make sure you notify the office with plenty of time for any  refills. 60 capsule 1   furosemide (LASIX) 40 MG tablet Take 1 tablet (40 mg total) by mouth daily. 30 tablet 0   hydrOXYzine (ATARAX/VISTARIL) 50 MG tablet Take 50 mg by mouth 4 (four) times daily as needed for anxiety.     insulin aspart (NOVOLOG) 100 UNIT/ML injection Inject 10 Units into the skin 3 (three) times daily with meals. 10 mL 0   insulin glargine (LANTUS SOLOSTAR) 100 UNIT/ML Solostar Pen Inject 42 Units into the skin daily. 15 mL 11   Naphazoline-Pheniramine (VISINE-A OP) Place 1 drop into both eyes daily as needed (allergies).     potassium chloride (KLOR-CON) 10 MEQ tablet Take 10 mEq by mouth daily.     No current facility-administered medications for this encounter.    Allergies  Allergen Reactions   Spironolactone Other (See Comments)    HYPERKALEMIA    Social History   Socioeconomic History   Marital status: Divorced    Spouse name: Not on file   Number of children: Not on file  Years of education: Not on file   Highest education level: Not on file  Occupational History   Not on file  Tobacco Use   Smoking status: Former    Packs/day: 0.50    Years: 45.00    Pack years: 22.50    Types: Cigarettes    Quit date: 02/09/2021    Years since quitting: 0.0   Smokeless tobacco: Never  Vaping Use   Vaping Use: Never used  Substance and Sexual Activity   Alcohol use: No    Alcohol/week: 0.0 standard drinks   Drug use: No   Sexual activity: Not on file  Other Topics Concern   Not on file  Social History Narrative   Not on file   Social Determinants of Health   Financial Resource Strain: Not on file  Food Insecurity: Not on file  Transportation Needs: Not on file  Physical Activity: Not on file  Stress: Not on file  Social Connections: Not on file  Intimate Partner Violence: Not on file     ROS- All systems are reviewed and negative except as per the HPI above.  Physical Exam: Vitals:   03/15/21 1002  BP: 102/68  Pulse: (!) 58  Weight: 113.1  kg  Height: 6\' 1"  (1.854 m)    GEN- The patient is a well appearing obese male, alert and oriented x 3 today.   Head- normocephalic, atraumatic Eyes-  Sclera clear, conjunctiva pink Ears- hearing intact Oropharynx- clear Neck- supple  Lungs- Clear to ausculation bilaterally, normal work of breathing Heart- Regular rate and rhythm, no murmurs, rubs or gallops  GI- soft, NT, ND, + BS Extremities- no clubbing, cyanosis, or edema MS- no significant deformity or atrophy Skin- no rash or lesion Psych- euthymic mood, full affect Neuro- strength and sensation are intact  Wt Readings from Last 3 Encounters:  03/15/21 113.1 kg  03/11/21 116.1 kg  03/08/21 117.3 kg    EKG today demonstrates  SB, 1st degree AV block, RBBB Vent. rate 58 BPM PR interval 228 ms QRS duration 170 ms QT/QTcB 542/532 ms  Echo 03/01/21 demonstrated   1. Left ventricular ejection fraction, by estimation, is 35 to 40%. The  left ventricle has moderately decreased function. Left ventricular  endocardial border not optimally defined to evaluate regional wall motion. There is moderate asymmetric left ventricular hypertrophy of the posterior segment. Left ventricular diastolic function could not be evaluated.   2. Right ventricular systolic function was not well visualized. The right ventricular size is not well visualized. Mildly increased right  ventricular wall thickness.   3. The mitral valve is degenerative. Unable to accurately assess mitral valve regurgitation.   4. The aortic valve is tricuspid. There is mild calcification of the  aortic valve. There is mild thickening of the aortic valve. Aortic valve regurgitation not well assessed. Mild to moderate aortic valve  sclerosis/calcification is present, without any  evidence of aortic stenosis.  Epic records are reviewed at length today  CHA2DS2-VASc Score = 4  The patient's score is based upon: CHF History: Yes HTN History: Yes Diabetes History:  Yes Stroke History: No Vascular Disease History: Yes Age Score: 0 Gender Score: 0     ASSESSMENT AND PLAN: 1. Persistent Atrial Fibrillation (ICD10:  I48.19) The patient's CHA2DS2-VASc score is 4, indicating a 4.8% annual risk of stroke.   Patient appears to be maintaining SR.  Continue dofetilide 500 mcg BID. QT stable. Check bmet/mag Continue carvedilol 25 mg BID Continue Eliquis 5 mg BID  2. Secondary Hypercoagulable State (ICD10:  D68.69) The patient is at significant risk for stroke/thromboembolism based upon his CHA2DS2-VASc Score of 4.  Continue Apixaban (Eliquis).   3. Obesity Body mass index is 32.9 kg/m. Lifestyle modification was discussed at length including regular exercise and weight reduction.  4. CAD S/p CABG No anginal symptoms.  5. Chronic systolic CHF ICM, EF 35-40% No signs or symptoms of fluid overload.  6. HTN Stable, no changes today.   Follow up with Dr Lalla Brothers as scheduled.    Jorja Loa PA-C [facility] [address] [place], [place] [address] [phone] 03/15/2021 2:16 PM

## 2021-03-15 ENCOUNTER — Other Ambulatory Visit: Payer: Self-pay

## 2021-03-15 ENCOUNTER — Encounter (HOSPITAL_COMMUNITY): Payer: Self-pay | Admitting: Physician Assistant

## 2021-03-15 ENCOUNTER — Ambulatory Visit (HOSPITAL_COMMUNITY)
Admit: 2021-03-15 | Discharge: 2021-03-15 | Disposition: A | Discharge status: Home / Self Care | Payer: Medicaid Other | Attending: Physician Assistant | Admitting: Physician Assistant

## 2021-03-15 VITALS — BP 102/68 | HR 58 | Ht 73.0 in | Wt 249.4 lb

## 2021-03-15 DIAGNOSIS — D6869 Other thrombophilia: Secondary | ICD-10-CM | POA: Diagnosis not present

## 2021-03-15 DIAGNOSIS — I252 Old myocardial infarction: Secondary | ICD-10-CM | POA: Diagnosis not present

## 2021-03-15 DIAGNOSIS — E119 Type 2 diabetes mellitus without complications: Secondary | ICD-10-CM | POA: Insufficient documentation

## 2021-03-15 DIAGNOSIS — Z6832 Body mass index (BMI) 32.0-32.9, adult: Secondary | ICD-10-CM | POA: Insufficient documentation

## 2021-03-15 DIAGNOSIS — I4510 Unspecified right bundle-branch block: Secondary | ICD-10-CM | POA: Insufficient documentation

## 2021-03-15 DIAGNOSIS — I4819 Other persistent atrial fibrillation: Secondary | ICD-10-CM

## 2021-03-15 DIAGNOSIS — Z7901 Long term (current) use of anticoagulants: Secondary | ICD-10-CM | POA: Insufficient documentation

## 2021-03-15 DIAGNOSIS — Z794 Long term (current) use of insulin: Secondary | ICD-10-CM | POA: Insufficient documentation

## 2021-03-15 DIAGNOSIS — Z79899 Other long term (current) drug therapy: Secondary | ICD-10-CM | POA: Insufficient documentation

## 2021-03-15 DIAGNOSIS — Z951 Presence of aortocoronary bypass graft: Secondary | ICD-10-CM | POA: Diagnosis not present

## 2021-03-15 DIAGNOSIS — I110 Hypertensive heart disease with heart failure: Secondary | ICD-10-CM | POA: Insufficient documentation

## 2021-03-15 DIAGNOSIS — Z7982 Long term (current) use of aspirin: Secondary | ICD-10-CM | POA: Diagnosis not present

## 2021-03-15 DIAGNOSIS — I700 Atherosclerosis of aorta: Secondary | ICD-10-CM | POA: Diagnosis not present

## 2021-03-15 DIAGNOSIS — E785 Hyperlipidemia, unspecified: Secondary | ICD-10-CM | POA: Insufficient documentation

## 2021-03-15 DIAGNOSIS — I2510 Atherosclerotic heart disease of native coronary artery without angina pectoris: Secondary | ICD-10-CM | POA: Insufficient documentation

## 2021-03-15 DIAGNOSIS — I440 Atrioventricular block, first degree: Secondary | ICD-10-CM | POA: Diagnosis not present

## 2021-03-15 DIAGNOSIS — I5022 Chronic systolic (congestive) heart failure: Secondary | ICD-10-CM | POA: Diagnosis not present

## 2021-03-15 DIAGNOSIS — Z87891 Personal history of nicotine dependence: Secondary | ICD-10-CM | POA: Insufficient documentation

## 2021-03-15 DIAGNOSIS — E669 Obesity, unspecified: Secondary | ICD-10-CM | POA: Diagnosis not present

## 2021-03-15 LAB — BASIC METABOLIC PANEL
Anion gap: 8 (ref 5–15)
BUN: 27 mg/dL — ABNORMAL HIGH (ref 8–23)
CO2: 24 mmol/L (ref 22–32)
Calcium: 9.8 mg/dL (ref 8.9–10.3)
Chloride: 102 mmol/L (ref 98–111)
Creatinine, Ser: 1.42 mg/dL — ABNORMAL HIGH (ref 0.61–1.24)
GFR, Estimated: 56 mL/min — ABNORMAL LOW (ref >60)
Glucose, Bld: 209 mg/dL — ABNORMAL HIGH (ref 70–99)
Potassium: 4.3 mmol/L (ref 3.5–5.1)
Sodium: 134 mmol/L — ABNORMAL LOW (ref 135–145)

## 2021-03-15 LAB — MAGNESIUM: Magnesium: 2.3 mg/dL (ref 1.7–2.4)

## 2021-03-17 ENCOUNTER — Ambulatory Visit: Payer: Medicaid Other | Admitting: Family

## 2021-03-18 ENCOUNTER — Ambulatory Visit: Payer: Medicaid Other | Admitting: Cardiology

## 2021-03-22 NOTE — Progress Notes (Signed)
Patient ID: Marc [name], male    DOB: 12/12/1960, 60 y.o.   MRN: [id number]  HPI  Marc [name] is a 60 y/o male with a history of CAD, DM, hyperlipidemia, HTN, anxiety, paroxysmal atrial fibrillation, PAD, recent tobacco use and chronic heart failure.   Echo report from 03/01/21 reviewed and showed an EF of 35-40% along with moderate LVH.  RHC/LHC completed 03/04/21 and showed: Ost RCA to Prox RCA lesion is 100% stenosed. Mid LM to Dist LM lesion is 90% stenosed. Ost LAD to Prox LAD lesion is 100% stenosed. Ost Cx to Prox Cx lesion is 50% stenosed. Mid Cx to Dist Cx lesion is 60% stenosed. 3rd Mrg lesion is 90% stenosed. 1st LPL lesion is 90% stenosed. 2nd LPL lesion is 90% stenosed. SVG graft was visualized by angiography. Prox Graft to Mid Graft lesion is 30% stenosed. Origin to Prox Graft lesion is 100% stenosed. LIMA graft was visualized by angiography and is normal in caliber. The graft exhibits no disease. Dist LAD lesion is 60% stenosed. Mid LAD lesion is 70% stenosed.   1.  Severe underlying left main and three-vessel coronary artery disease with patent LIMA to LAD and patent SVG to OM2/ramus.  Occluded SVG to OM 3.  The RCA is known to be small and nondominant and occluded proximally with some left-to-right collaterals. 2.  Left ventricular angiography was not performed.  EF was moderately reduced by echo. 3.  Right heart catheterization showed mildly elevated wedge pressure at 20 mmHg with a prominent V wave, mild pulmonary hypertension and moderately reduced cardiac output.  Admitted 03/01/21 due to acute on chronic HF. Initially given IV lasix with transition to oral diuretics. Cardiology consult obtained. Cath completed. AF converted to NSR. Dofetilide started. Discharged after 7 days.   He presents today with a chief complaint of an initial visit. He currently doesn't have any symptoms and specifically denies any dizziness, difficulty sleeping, cough, shortness of breath,  chest pain, pedal edema, palpitations, abdominal distention, fatigue or weight gain.   He says that he will be unable to afford his entresto without assistance.   Past Medical History:  Diagnosis Date   Anxiety    Chronic systolic heart failure (HCC)    a. 07/2013 Echo: Ejection fraction of 45-50% with possible inferior wall hypokinesis; b. 03/2018 Echo: EF 40-45%; c. 06/2020 Echo: EF 40-45%, glob HK. Mod LVH, gr1 DD. Nl PASP. Mild BAE. Triv Marc. Mild AoV sclerosis. Mild Ao dil (73mm).   Coronary artery disease    Significant left main and Severe three-vessel coronary artery disease in December of 2014. He underwent CABG in January of 2015 with LIMA to LAD, sequential SVG to ramus/OM 2 and SVG to OM 3   Diabetes mellitus without complication (HCC)    Hyperlipidemia    Hypertension    Ischemic cardiomyopathy    a. 07/2013 Echo: EF 45-50%; b. 03/2018 Echo: EF 40-45%; c. 06/2020 Echo: EF 40-45%.   Medical non-compliance    MI (myocardial infarction) (HCC)    Obesity    PAD (peripheral artery disease) (HCC)    a. 04/2020 PTA of L PT.   PAF (paroxysmal atrial fibrillation) (HCC)    Tobacco abuse    Past Surgical History:  Procedure Laterality Date   AMPUTATION Left 04/08/2020   Procedure: AMPUTATION RAY;  Surgeon: Rosetta Posner, DPM;  Location: [facility] ORS;  Service: Podiatry;  Laterality: Left;   AMPUTATION Left 06/25/2020   Procedure: AMPUTATION RAY 1ST RAY PARTIAL LEFT;  Surgeon:  Rosetta Posner, DPM;  Location: [facility] ORS;  Service: Podiatry;  Laterality: Left;   AMPUTATION TOE Right 02/29/2016   Procedure: AMPUTATION TOE;  Surgeon: Linus Galas, DPM;  Location: [facility] ORS;  Service: Podiatry;  Laterality: Right;  Great toe   CARDIAC CATHETERIZATION     [facility] no stent   CARDIOVERSION N/A 02/23/2021   Procedure: CARDIOVERSION;  Surgeon: Debbe Odea, MD;  Location: [facility] OR[facility];  Service: Cardiovascular;  Laterality: N/A;   CORONARY ARTERY BYPASS GRAFT N/A 09/16/2013   Procedure: CORONARY  ARTERY BYPASS GRAFTING (CABG);  Surgeon: [name], MD;  Location[facility] OR;  Service: Open Heart Surgery;  Laterality: N/A;  CABG x four, using left internal mammary artery and right leg greater saphenous vein   INTRAOPERATIVE TRANSESOPHAGEAL ECHOCARDIOGRAM N/A 09/16/2013   Procedure: INTRAOPERATIVE TRANSESOPHAGEAL ECHOCARDIOGRAM;  Surgeon: [name], MD;  Location: [facility] OR;  Service: Open Heart Surgery;  Laterality: N/A;   knee surgery Right    LASIK Bilateral    LOWER EXTREMITY ANGIOGRAPHY Left 04/07/2020   Procedure: Lower Extremity Angiography;  Surgeon: Renford Dills, MD;  Location: [facility] INVAS[facility] CV LAB;  Service: Cardiovascular;  Laterality: Left;   LOWER EXTREMITY ANGIOGRAPHY Left 04/09/2020   Procedure: Lower Extremity Angiography (Pedal Approach);  Surgeon: Renford Dills, MD;  Location: [facility];  Service: Cardiovascular;  Laterality: Left;   RIGHT/LEFT HEART CATH AND CORONARY/GRAFT ANGIOGRAPHY N/A 03/04/2021   Procedure: RIGHT/LEFT HEART CATH AND CORONARY/GRAFT ANGIOGRAPHY;  Surgeon: Iran Ouch, MD;  Location: [facility] LAB;  Service: Cardiovascular;  Laterality: N/A;   Family History  Problem Relation Age of Onset   Heart disease Mother    Social History   Tobacco Use   Smoking status: Former    Packs/day: 0.50    Years: 45.00    Pack years: 22.50    Types: Cigarettes    Quit date: 02/09/2021    Years since quitting: 0.1   Smokeless tobacco: Never  Substance Use Topics   Alcohol use: No    Alcohol/week: 0.0 standard drinks   Allergies  Allergen Reactions   Spironolactone Other (See Comments)    HYPERKALEMIA   Prior to Admission medications   Medication Sig Start Date End Date Taking? Authorizing Provider  acetaminophen (TYLENOL) 500 MG tablet Take 1,000 mg by mouth every 6 (six) hours as needed for moderate pain or headache.   Yes [provider]  apixaban (ELIQUIS) 5 MG TABS tablet Take 1 tablet (5 mg total) by mouth 2 (two)  times daily. 01/21/21  Yes Creig Hines, NP  aspirin EC 81 MG EC tablet Take 1 tablet (81 mg total) by mouth daily. Swallow whole. 03/09/21  Yes Marrion Coy, MD  atorvastatin (LIPITOR) 80 MG tablet Take 80 mg by mouth every evening.  01/31/18  Yes [provider]  calcium carbonate (TUMS - DOSED IN MG ELEMENTAL CALCIUM) 500 MG chewable tablet Chew 500 mg by mouth daily as needed for indigestion or heartburn.   Yes [provider]  carvedilol (COREG) 25 MG tablet Take 25 mg by mouth 2 (two) times daily.   Yes [provider]  dapagliflozin propanediol (FARXIGA) 10 MG TABS tablet Take 1 tablet (10 mg total) by mouth daily. 03/08/21  Yes Marrion Coy, MD  dofetilide (TIKOSYN) 500 MCG capsule Take 1 capsule (500 mcg total) by mouth 2 (two) times daily. It is important you do not miss any doses of this medication. Call the office if you miss 3  doses. Make sure you notify the office with plenty of time for any refills. 03/08/21  Yes Visser, Jacquelyn D, PA-C  furosemide (LASIX) 40 MG tablet Take 1 tablet (40 mg total) by mouth daily. 03/09/21  Yes Marrion Coy, MD  hydrOXYzine (ATARAX/VISTARIL) 50 MG tablet Take 50 mg by mouth 4 (four) times daily as needed for anxiety. 01/29/21  Yes [provider]  insulin aspart (NOVOLOG) 100 UNIT/ML injection Inject 10 Units into the skin 3 (three) times daily with meals. 03/08/21  Yes Marrion Coy, MD  insulin glargine (LANTUS SOLOSTAR) 100 UNIT/ML Solostar Pen Inject 42 Units into the skin daily. 01/07/21  Yes Rai, Ripudeep K, MD  Naphazoline-Pheniramine (VISINE-A OP) Place 1 drop into both eyes daily as needed (allergies).   Yes [provider]  potassium chloride (KLOR-CON) 10 MEQ tablet Take 10 mEq by mouth daily. 02/08/21  Yes [provider]  sacubitril-valsartan (ENTRESTO) 49-51 MG Take 1 tablet by mouth 2 (two) times daily.   Yes [provider]    Review of Systems  Constitutional:  Negative for  appetite change and fatigue.  HENT:  Negative for congestion, postnasal drip and sore throat.   Eyes: Negative.   Respiratory:  Negative for cough, chest tightness and shortness of breath.   Cardiovascular:  Negative for chest pain, palpitations and leg swelling.  Gastrointestinal:  Negative for abdominal distention and abdominal pain.  Endocrine: Negative.   Genitourinary: Negative.   Musculoskeletal:  Negative for back pain and neck pain.  Skin: Negative.   Allergic/Immunologic: Negative.   Neurological:  Negative for dizziness and light-headedness.  Hematological:  Negative for adenopathy. Does not bruise/bleed easily.  Psychiatric/Behavioral:  Negative for dysphoric mood and sleep disturbance (sleeping on 1 pillow). The patient is not nervous/anxious.    Vitals:   03/23/21 1333  BP: (!) 150/77  Pulse: 65  Resp: 16  SpO2: 98%  Weight: 251 lb 4 oz (114 kg)  Height: 6\' 1"  (1.854 m)   Wt Readings from Last 3 Encounters:  03/23/21 251 lb 4 oz (114 kg)  03/15/21 249 lb 6.4 oz (113.1 kg)  03/11/21 256 lb (116.1 kg)   Lab Results  Component Value Date   CREATININE 1.42 (H) 03/15/2021   CREATININE 1.35 (H) 03/08/2021   CREATININE 1.30 (H) 03/07/2021    Physical Exam Vitals and nursing note reviewed.  Constitutional:      Appearance: Normal appearance.  HENT:     Head: Normocephalic and atraumatic.  Cardiovascular:     Rate and Rhythm: Normal rate. Rhythm irregular.  Pulmonary:     Effort: Pulmonary effort is normal. No respiratory distress.     Breath sounds: No wheezing or rales.  Abdominal:     General: There is no distension.     Palpations: Abdomen is soft.     Tenderness: There is no abdominal tenderness.  Musculoskeletal:        General: No tenderness.     Cervical back: Normal range of motion and neck supple.     Right lower leg: No edema.     Left lower leg: No edema.  Skin:    General: Skin is warm and dry.  Neurological:     General: No focal deficit  present.     Mental Status: He is alert and oriented to person, place, and time.  Psychiatric:        Mood and Affect: Mood normal.        Behavior: Behavior normal.  Thought Content: Thought content normal.    Assessment & Plan:  1: Chronic heart failure with reduced ejection fraction- - NYHA class I - euvolemic today - weighing every few days; instructed to weigh daily and call for an overnight weight gain of > 2 pounds or a weekly weight gain of > 5 pounds - not adding salt and the importance of following a low sodium diet was discussed; low sodium cookbook given to patient - saw cardiology (Agbor-Etang) 03/11/21 - on GDMT of carvedilol, dapagliflozine and entresto - history of hyperkalemia so hasn't been on aldactone - 1 month samples of entresto 49/51mg  provided and [employer] patient assistance forms filled out today - BNP 03/01/21 was 920.7  2: HTN- - BP mildly elevated (150/77) - sees PCP at [facility] - BMP 03/15/21 reviewed and showed sodium 134, potassium 4.3, creatinine 1.42 and GFR 56  3: DM- - A1c 01/07/21 was 7.8% - glucose at home today was 161  4: Atrial fibrillation- - went to a fib clinic 03/15/21 - magnesium 03/15/21 was 2.3   Patient did not bring his medications nor a list. Each medication was verbally reviewed with the patient and he was encouraged to bring the bottles to every visit to confirm accuracy of list.   Return in 2 months or sooner for any questions/problems before then.

## 2021-03-23 ENCOUNTER — Other Ambulatory Visit: Payer: Self-pay

## 2021-03-23 ENCOUNTER — Ambulatory Visit: Payer: Medicaid Other | Attending: Family | Admitting: Family

## 2021-03-23 ENCOUNTER — Encounter: Payer: Self-pay | Admitting: Family

## 2021-03-23 VITALS — BP 150/77 | HR 65 | Resp 16 | Ht 73.0 in | Wt 251.2 lb

## 2021-03-23 DIAGNOSIS — I5020 Unspecified systolic (congestive) heart failure: Secondary | ICD-10-CM

## 2021-03-23 DIAGNOSIS — Z951 Presence of aortocoronary bypass graft: Secondary | ICD-10-CM | POA: Insufficient documentation

## 2021-03-23 DIAGNOSIS — Z79899 Other long term (current) drug therapy: Secondary | ICD-10-CM | POA: Insufficient documentation

## 2021-03-23 DIAGNOSIS — E1122 Type 2 diabetes mellitus with diabetic chronic kidney disease: Secondary | ICD-10-CM

## 2021-03-23 DIAGNOSIS — E1151 Type 2 diabetes mellitus with diabetic peripheral angiopathy without gangrene: Secondary | ICD-10-CM | POA: Diagnosis not present

## 2021-03-23 DIAGNOSIS — Z7982 Long term (current) use of aspirin: Secondary | ICD-10-CM | POA: Diagnosis not present

## 2021-03-23 DIAGNOSIS — I5022 Chronic systolic (congestive) heart failure: Secondary | ICD-10-CM | POA: Diagnosis not present

## 2021-03-23 DIAGNOSIS — Z87891 Personal history of nicotine dependence: Secondary | ICD-10-CM | POA: Insufficient documentation

## 2021-03-23 DIAGNOSIS — I4819 Other persistent atrial fibrillation: Secondary | ICD-10-CM

## 2021-03-23 DIAGNOSIS — Z8249 Family history of ischemic heart disease and other diseases of the circulatory system: Secondary | ICD-10-CM | POA: Diagnosis not present

## 2021-03-23 DIAGNOSIS — I480 Paroxysmal atrial fibrillation: Secondary | ICD-10-CM | POA: Insufficient documentation

## 2021-03-23 DIAGNOSIS — I10 Essential (primary) hypertension: Secondary | ICD-10-CM

## 2021-03-23 DIAGNOSIS — Z794 Long term (current) use of insulin: Secondary | ICD-10-CM | POA: Diagnosis not present

## 2021-03-23 DIAGNOSIS — I110 Hypertensive heart disease with heart failure: Secondary | ICD-10-CM | POA: Insufficient documentation

## 2021-03-23 DIAGNOSIS — N1831 Chronic kidney disease, stage 3a: Secondary | ICD-10-CM

## 2021-03-23 NOTE — Patient Instructions (Signed)
Continue weighing daily and call for an overnight weight gain of > 2 pounds or a weekly weight gain of >5 pounds. 

## 2021-04-08 ENCOUNTER — Telehealth: Payer: Self-pay | Admitting: Family

## 2021-04-08 NOTE — Telephone Encounter (Signed)
Received information from [employer] Patient Assistance that he has been approved for [employer]. First shipment will be mailed and then patient will need to call [employer] directly 7-10 days prior to running out of the medication to tell them he needs the next shipment sent. It will not automatically be sent.   This is approved until 04/08/22. He can also call [employer] directly at [phone] if he doesn't receive the shipment next week.   Patient was appreciative of this approval.

## 2021-04-11 ENCOUNTER — Emergency Department
Admission: EM | Admit: 2021-04-11 | Discharge: 2021-04-11 | Discharge status: Against Medical Advice | Payer: Medicaid Other

## 2021-04-13 ENCOUNTER — Institutional Professional Consult (permissible substitution): Payer: Medicaid Other | Admitting: Cardiology

## 2021-05-06 ENCOUNTER — Other Ambulatory Visit: Payer: Self-pay | Admitting: Cardiology

## 2021-05-06 DIAGNOSIS — I4819 Other persistent atrial fibrillation: Secondary | ICD-10-CM

## 2021-05-06 MED ORDER — APIXABAN 5 MG PO TABS: 5.0000 mg | ORAL_TABLET | Freq: Two times a day (BID) | ORAL | 1 refills | Status: AC

## 2021-05-06 MED ORDER — DOFETILIDE 500 MCG PO CAPS: 500.0000 ug | ORAL_CAPSULE | Freq: Two times a day (BID) | ORAL | 0 refills | Status: DC

## 2021-05-06 MED ORDER — ENTRESTO 49-51 MG PO TABS: 1.0000 | ORAL_TABLET | Freq: Two times a day (BID) | ORAL | 0 refills | Status: DC

## 2021-05-06 NOTE — Telephone Encounter (Signed)
Requested Prescriptions   Pending Prescriptions Disp Refills   apixaban (ELIQUIS) 5 MG TABS tablet 180 tablet 0    Sig: Take 1 tablet (5 mg total) by mouth 2 (two) times daily.   Signed Prescriptions Disp Refills   dofetilide (TIKOSYN) 500 MCG capsule 180 capsule 0    Sig: Take 1 capsule (500 mcg total) by mouth 2 (two) times daily. It is important you do not miss any doses of this medication. Call the office if you miss 3 doses. Make sure you notify the office with plenty of time for any refills.    Authorizing Provider: Debbe Odea    Ordering User: Kendrick Fries   sacubitril-valsartan (ENTRESTO) 49-51 MG 180 tablet 0    Sig: Take 1 tablet by mouth 2 (two) times daily.    Authorizing Provider: Debbe Odea    Ordering User: Kendrick Fries

## 2021-05-06 NOTE — Telephone Encounter (Signed)
*  STAT* If patient is at the pharmacy, call can be transferred to refill team.   1. Which medications need to be refilled? (please list name of each medication and dose if known)  Dofetilide 500 mcg po BID Eliquis 5 mg po BID Entresto 49-51 mg po BID  2. Which pharmacy/location (including street and city if local pharmacy) is medication to be sent to? [facility]   3. Do they need a 30 day or 90 day supply? 90  Asap patient out and cannot miss dose

## 2021-05-06 NOTE — Telephone Encounter (Signed)
Prescription refill request for Eliquis received.  Indication: afib Last office visit: 03/11/2021, Agbor-Etang Scr: 1.05, 04/14/2021 Age: 60 yo  Weight: 114 kg   Refill sent.

## 2021-05-13 ENCOUNTER — Emergency Department: Payer: Medicaid Other

## 2021-05-13 ENCOUNTER — Inpatient Hospital Stay
Admission: EM | Admit: 2021-05-13 | Discharge: 2021-05-15 | DRG: 291 | Disposition: A | Discharge status: Home / Self Care | Payer: Medicaid Other | Attending: Internal Medicine | Admitting: Internal Medicine

## 2021-05-13 ENCOUNTER — Other Ambulatory Visit: Payer: Self-pay

## 2021-05-13 DIAGNOSIS — I252 Old myocardial infarction: Secondary | ICD-10-CM | POA: Diagnosis not present

## 2021-05-13 DIAGNOSIS — R778 Other specified abnormalities of plasma proteins: Secondary | ICD-10-CM

## 2021-05-13 DIAGNOSIS — R7989 Other specified abnormal findings of blood chemistry: Secondary | ICD-10-CM | POA: Diagnosis present

## 2021-05-13 DIAGNOSIS — E876 Hypokalemia: Secondary | ICD-10-CM | POA: Diagnosis present

## 2021-05-13 DIAGNOSIS — I214 Non-ST elevation (NSTEMI) myocardial infarction: Secondary | ICD-10-CM | POA: Diagnosis not present

## 2021-05-13 DIAGNOSIS — R0602 Shortness of breath: Secondary | ICD-10-CM

## 2021-05-13 DIAGNOSIS — E785 Hyperlipidemia, unspecified: Secondary | ICD-10-CM | POA: Diagnosis present

## 2021-05-13 DIAGNOSIS — Z89411 Acquired absence of right great toe: Secondary | ICD-10-CM

## 2021-05-13 DIAGNOSIS — I959 Hypotension, unspecified: Secondary | ICD-10-CM | POA: Diagnosis not present

## 2021-05-13 DIAGNOSIS — Z794 Long term (current) use of insulin: Secondary | ICD-10-CM | POA: Diagnosis not present

## 2021-05-13 DIAGNOSIS — I130 Hypertensive heart and chronic kidney disease with heart failure and stage 1 through stage 4 chronic kidney disease, or unspecified chronic kidney disease: Principal | ICD-10-CM | POA: Diagnosis present

## 2021-05-13 DIAGNOSIS — F419 Anxiety disorder, unspecified: Secondary | ICD-10-CM | POA: Diagnosis present

## 2021-05-13 DIAGNOSIS — Z87891 Personal history of nicotine dependence: Secondary | ICD-10-CM | POA: Diagnosis not present

## 2021-05-13 DIAGNOSIS — Z7901 Long term (current) use of anticoagulants: Secondary | ICD-10-CM | POA: Diagnosis not present

## 2021-05-13 DIAGNOSIS — I10 Essential (primary) hypertension: Secondary | ICD-10-CM | POA: Diagnosis not present

## 2021-05-13 DIAGNOSIS — E1122 Type 2 diabetes mellitus with diabetic chronic kidney disease: Secondary | ICD-10-CM | POA: Diagnosis present

## 2021-05-13 DIAGNOSIS — D72829 Elevated white blood cell count, unspecified: Secondary | ICD-10-CM

## 2021-05-13 DIAGNOSIS — E1129 Type 2 diabetes mellitus with other diabetic kidney complication: Secondary | ICD-10-CM | POA: Diagnosis present

## 2021-05-13 DIAGNOSIS — E669 Obesity, unspecified: Secondary | ICD-10-CM | POA: Diagnosis present

## 2021-05-13 DIAGNOSIS — F32A Depression, unspecified: Secondary | ICD-10-CM | POA: Diagnosis present

## 2021-05-13 DIAGNOSIS — Z20822 Contact with and (suspected) exposure to covid-19: Secondary | ICD-10-CM | POA: Diagnosis present

## 2021-05-13 DIAGNOSIS — F410 Panic disorder [episodic paroxysmal anxiety] without agoraphobia: Secondary | ICD-10-CM | POA: Diagnosis present

## 2021-05-13 DIAGNOSIS — I4891 Unspecified atrial fibrillation: Secondary | ICD-10-CM | POA: Diagnosis present

## 2021-05-13 DIAGNOSIS — I5023 Acute on chronic systolic (congestive) heart failure: Secondary | ICD-10-CM | POA: Diagnosis not present

## 2021-05-13 DIAGNOSIS — N1831 Chronic kidney disease, stage 3a: Secondary | ICD-10-CM | POA: Diagnosis present

## 2021-05-13 DIAGNOSIS — Z89432 Acquired absence of left foot: Secondary | ICD-10-CM

## 2021-05-13 DIAGNOSIS — I255 Ischemic cardiomyopathy: Secondary | ICD-10-CM | POA: Diagnosis present

## 2021-05-13 DIAGNOSIS — E1151 Type 2 diabetes mellitus with diabetic peripheral angiopathy without gangrene: Secondary | ICD-10-CM | POA: Diagnosis present

## 2021-05-13 DIAGNOSIS — Z888 Allergy status to other drugs, medicaments and biological substances status: Secondary | ICD-10-CM

## 2021-05-13 DIAGNOSIS — N183 Chronic kidney disease, stage 3 unspecified: Secondary | ICD-10-CM | POA: Diagnosis present

## 2021-05-13 DIAGNOSIS — I480 Paroxysmal atrial fibrillation: Secondary | ICD-10-CM | POA: Diagnosis not present

## 2021-05-13 DIAGNOSIS — I4819 Other persistent atrial fibrillation: Secondary | ICD-10-CM

## 2021-05-13 DIAGNOSIS — I2510 Atherosclerotic heart disease of native coronary artery without angina pectoris: Secondary | ICD-10-CM | POA: Diagnosis present

## 2021-05-13 DIAGNOSIS — Z79899 Other long term (current) drug therapy: Secondary | ICD-10-CM | POA: Diagnosis not present

## 2021-05-13 DIAGNOSIS — Z8249 Family history of ischemic heart disease and other diseases of the circulatory system: Secondary | ICD-10-CM

## 2021-05-13 DIAGNOSIS — Z6834 Body mass index (BMI) 34.0-34.9, adult: Secondary | ICD-10-CM | POA: Diagnosis not present

## 2021-05-13 DIAGNOSIS — J9621 Acute and chronic respiratory failure with hypoxia: Secondary | ICD-10-CM | POA: Diagnosis present

## 2021-05-13 DIAGNOSIS — J9601 Acute respiratory failure with hypoxia: Secondary | ICD-10-CM | POA: Diagnosis present

## 2021-05-13 DIAGNOSIS — Z7982 Long term (current) use of aspirin: Secondary | ICD-10-CM

## 2021-05-13 DIAGNOSIS — I5043 Acute on chronic combined systolic (congestive) and diastolic (congestive) heart failure: Secondary | ICD-10-CM | POA: Diagnosis present

## 2021-05-13 DIAGNOSIS — I25118 Atherosclerotic heart disease of native coronary artery with other forms of angina pectoris: Secondary | ICD-10-CM | POA: Diagnosis not present

## 2021-05-13 DIAGNOSIS — J449 Chronic obstructive pulmonary disease, unspecified: Secondary | ICD-10-CM | POA: Diagnosis present

## 2021-05-13 DIAGNOSIS — R0902 Hypoxemia: Secondary | ICD-10-CM

## 2021-05-13 LAB — LACTIC ACID, PLASMA
Lactic Acid, Venous: 3.1 mmol/L (ref 0.5–1.9)
Lactic Acid, Venous: 1.3 mmol/L (ref 0.5–1.9)
Lactic Acid, Venous: 2 mmol/L (ref 0.5–1.9)
Lactic Acid, Venous: 1.8 mmol/L (ref 0.5–1.9)

## 2021-05-13 LAB — COMPREHENSIVE METABOLIC PANEL
ALT: 19 U/L (ref 0–44)
AST: 20 U/L (ref 15–41)
Albumin: 4.1 g/dL (ref 3.5–5.0)
Alkaline Phosphatase: 106 U/L (ref 38–126)
Anion gap: 10 (ref 5–15)
BUN: 13 mg/dL (ref 8–23)
CO2: 26 mmol/L (ref 22–32)
Calcium: 9.7 mg/dL (ref 8.9–10.3)
Chloride: 99 mmol/L (ref 98–111)
Creatinine, Ser: 0.87 mg/dL (ref 0.61–1.24)
GFR, Estimated: >60 mL/min (ref >60)
Glucose, Bld: 170 mg/dL — ABNORMAL HIGH (ref 70–99)
Potassium: 3.3 mmol/L — ABNORMAL LOW (ref 3.5–5.1)
Sodium: 135 mmol/L (ref 135–145)
Total Bilirubin: 1.1 mg/dL (ref 0.3–1.2)
Total Protein: 7.4 g/dL (ref 6.5–8.1)

## 2021-05-13 LAB — CBC WITH DIFFERENTIAL/PLATELET
Abs Immature Granulocytes: 0.05 10*3/uL (ref 0.00–0.07)
Basophils Absolute: 0.1 10*3/uL (ref 0.0–0.1)
Basophils Relative: 1 %
Eosinophils Absolute: 0.3 10*3/uL (ref 0.0–0.5)
Eosinophils Relative: 3 %
HCT: 48 % (ref 39.0–52.0)
Hemoglobin: 15.8 g/dL (ref 13.0–17.0)
Immature Granulocytes: 0 %
Lymphocytes Relative: 14 %
Lymphs Abs: 1.6 10*3/uL (ref 0.7–4.0)
MCH: 30.2 pg (ref 26.0–34.0)
MCHC: 32.9 g/dL (ref 30.0–36.0)
MCV: 91.8 fL (ref 80.0–100.0)
Monocytes Absolute: 0.8 10*3/uL (ref 0.1–1.0)
Monocytes Relative: 6 %
Neutro Abs: 8.8 10*3/uL — ABNORMAL HIGH (ref 1.7–7.7)
Neutrophils Relative %: 76 %
Platelets: 303 10*3/uL (ref 150–400)
RBC: 5.23 MIL/uL (ref 4.22–5.81)
RDW: 15.9 % — ABNORMAL HIGH (ref 11.5–15.5)
WBC: 11.6 10*3/uL — ABNORMAL HIGH (ref 4.0–10.5)
nRBC: 0 % (ref 0.0–0.2)

## 2021-05-13 LAB — RESP PANEL BY RT-PCR (FLU A&B, COVID) ARPGX2
Influenza A by PCR: NEGATIVE
Influenza B by PCR: NEGATIVE
SARS Coronavirus 2 by RT PCR: NEGATIVE

## 2021-05-13 LAB — TROPONIN I (HIGH SENSITIVITY)
Troponin I (High Sensitivity): 43 ng/L — ABNORMAL HIGH (ref <18)
Troponin I (High Sensitivity): 481 ng/L (ref <18)
Troponin I (High Sensitivity): 1362 ng/L (ref <18)
Troponin I (High Sensitivity): 1552 ng/L (ref <18)
Troponin I (High Sensitivity): 1570 ng/L (ref <18)

## 2021-05-13 LAB — HIV ANTIBODY (ROUTINE TESTING W REFLEX): HIV Screen 4th Generation wRfx: NONREACTIVE

## 2021-05-13 LAB — HEMOGLOBIN A1C
Hgb A1c MFr Bld: 6.9 % — ABNORMAL HIGH (ref 4.8–5.6)
Mean Plasma Glucose: 151.33 mg/dL

## 2021-05-13 LAB — PROCALCITONIN: Procalcitonin: <0.1 ng/mL

## 2021-05-13 LAB — BRAIN NATRIURETIC PEPTIDE: B Natriuretic Peptide: 2012.2 pg/mL — ABNORMAL HIGH (ref 0.0–100.0)

## 2021-05-13 LAB — APTT: aPTT: 34 seconds (ref 24–36)

## 2021-05-13 LAB — MAGNESIUM: Magnesium: 2.2 mg/dL (ref 1.7–2.4)

## 2021-05-13 LAB — CBG MONITORING, ED: Glucose-Capillary: 362 mg/dL — ABNORMAL HIGH (ref 70–99)

## 2021-05-13 LAB — GLUCOSE, CAPILLARY: Glucose-Capillary: 331 mg/dL — ABNORMAL HIGH (ref 70–99)

## 2021-05-13 LAB — HEPARIN LEVEL (UNFRACTIONATED): Heparin Unfractionated: >1.1 IU/mL — ABNORMAL HIGH (ref 0.30–0.70)

## 2021-05-13 MED ORDER — HEPARIN (PORCINE) 25000 UT/250ML-% IV SOLN
1600.0000 [IU]/h | INTRAVENOUS | Status: DC
  Administered 2021-05-13 – 2021-05-15 (×3): 1500 [IU]/h via INTRAVENOUS
  Filled 2021-05-13 (×3): qty 250

## 2021-05-13 MED ORDER — ALBUTEROL SULFATE (2.5 MG/3ML) 0.083% IN NEBU: 2.5000 mg | INHALATION_SOLUTION | RESPIRATORY_TRACT | Status: DC | PRN

## 2021-05-13 MED ORDER — HEPARIN BOLUS VIA INFUSION
4000.0000 [IU] | Freq: Once | INTRAVENOUS | Status: AC
  Administered 2021-05-13: 4000 [IU] via INTRAVENOUS
  Filled 2021-05-13: qty 4000

## 2021-05-13 MED ORDER — IPRATROPIUM-ALBUTEROL 0.5-2.5 (3) MG/3ML IN SOLN
3.0000 mL | Freq: Once | RESPIRATORY_TRACT | Status: AC
  Administered 2021-05-13: 3 mL via RESPIRATORY_TRACT
  Filled 2021-05-13: qty 3

## 2021-05-13 MED ORDER — CALCIUM CARBONATE ANTACID 500 MG PO CHEW
500.0000 mg | CHEWABLE_TABLET | Freq: Every day | ORAL | Status: DC | PRN
  Administered 2021-05-14: 500 mg via ORAL
  Filled 2021-05-13: qty 3

## 2021-05-13 MED ORDER — HYDRALAZINE HCL 20 MG/ML IJ SOLN: 5.0000 mg | INTRAMUSCULAR | Status: DC | PRN

## 2021-05-13 MED ORDER — ESCITALOPRAM OXALATE 10 MG PO TABS
5.0000 mg | ORAL_TABLET | Freq: Every day | ORAL | Status: DC
  Administered 2021-05-13: 5 mg via ORAL
  Filled 2021-05-13: qty 0.5

## 2021-05-13 MED ORDER — INSULIN GLARGINE-YFGN 100 UNIT/ML ~~LOC~~ SOLN
30.0000 [IU] | Freq: Every day | SUBCUTANEOUS | Status: DC
  Administered 2021-05-13 – 2021-05-15 (×3): 30 [IU] via SUBCUTANEOUS
  Filled 2021-05-13 (×3): qty 0.3

## 2021-05-13 MED ORDER — ACETAMINOPHEN 325 MG PO TABS
650.0000 mg | ORAL_TABLET | Freq: Four times a day (QID) | ORAL | Status: DC | PRN
  Administered 2021-05-13: 650 mg via ORAL
  Filled 2021-05-13: qty 2

## 2021-05-13 MED ORDER — DIAZEPAM 5 MG PO TABS
5.0000 mg | ORAL_TABLET | Freq: Three times a day (TID) | ORAL | Status: DC | PRN
  Administered 2021-05-13 – 2021-05-14 (×2): 5 mg via ORAL
  Filled 2021-05-13 (×2): qty 1

## 2021-05-13 MED ORDER — NAPHAZOLINE-PHENIRAMINE 0.025-0.3 % OP SOLN
1.0000 [drp] | Freq: Every day | OPHTHALMIC | Status: DC | PRN
  Filled 2021-05-13: qty 5

## 2021-05-13 MED ORDER — ATORVASTATIN CALCIUM 80 MG PO TABS
80.0000 mg | ORAL_TABLET | Freq: Every evening | ORAL | Status: DC
  Administered 2021-05-13 – 2021-05-14 (×2): 80 mg via ORAL
  Filled 2021-05-13 (×3): qty 1

## 2021-05-13 MED ORDER — FUROSEMIDE 10 MG/ML IJ SOLN
60.0000 mg | Freq: Once | INTRAMUSCULAR | Status: AC
  Administered 2021-05-13: 60 mg via INTRAVENOUS
  Filled 2021-05-13: qty 8

## 2021-05-13 MED ORDER — INSULIN ASPART 100 UNIT/ML IJ SOLN
0.0000 [IU] | Freq: Every day | INTRAMUSCULAR | Status: DC
  Administered 2021-05-13: 4 [IU] via SUBCUTANEOUS
  Filled 2021-05-13: qty 1

## 2021-05-13 MED ORDER — DM-GUAIFENESIN ER 30-600 MG PO TB12: 1.0000 | ORAL_TABLET | Freq: Two times a day (BID) | ORAL | Status: DC | PRN

## 2021-05-13 MED ORDER — INSULIN ASPART 100 UNIT/ML IJ SOLN
0.0000 [IU] | Freq: Three times a day (TID) | INTRAMUSCULAR | Status: DC
  Administered 2021-05-13: 9 [IU] via SUBCUTANEOUS
  Administered 2021-05-14 (×2): 2 [IU] via SUBCUTANEOUS
  Administered 2021-05-14: 3 [IU] via SUBCUTANEOUS
  Administered 2021-05-15: 2 [IU] via SUBCUTANEOUS
  Filled 2021-05-13 (×5): qty 1

## 2021-05-13 MED ORDER — ALBUTEROL SULFATE HFA 108 (90 BASE) MCG/ACT IN AERS: 2.0000 | INHALATION_SPRAY | RESPIRATORY_TRACT | Status: DC | PRN

## 2021-05-13 MED ORDER — FUROSEMIDE 10 MG/ML IJ SOLN
40.0000 mg | Freq: Two times a day (BID) | INTRAMUSCULAR | Status: DC
  Administered 2021-05-13 – 2021-05-15 (×4): 40 mg via INTRAVENOUS
  Filled 2021-05-13 (×4): qty 4

## 2021-05-13 MED ORDER — POTASSIUM CHLORIDE CRYS ER 20 MEQ PO TBCR
40.0000 meq | EXTENDED_RELEASE_TABLET | ORAL | Status: AC
Start: 2021-05-13 — End: 2021-05-13
  Administered 2021-05-13: 40 meq via ORAL
  Filled 2021-05-13: qty 2

## 2021-05-13 MED ORDER — SACUBITRIL-VALSARTAN 49-51 MG PO TABS
1.0000 | ORAL_TABLET | Freq: Two times a day (BID) | ORAL | Status: DC
  Administered 2021-05-13 – 2021-05-15 (×4): 1 via ORAL
  Filled 2021-05-13 (×5): qty 1

## 2021-05-13 MED ORDER — DOFETILIDE 250 MCG PO CAPS
500.0000 ug | ORAL_CAPSULE | Freq: Two times a day (BID) | ORAL | Status: DC
  Administered 2021-05-13 – 2021-05-15 (×3): 500 ug via ORAL
  Filled 2021-05-13 (×6): qty 2

## 2021-05-13 MED ORDER — ASPIRIN EC 81 MG PO TBEC
81.0000 mg | DELAYED_RELEASE_TABLET | Freq: Every day | ORAL | Status: DC
  Administered 2021-05-14 – 2021-05-15 (×2): 81 mg via ORAL
  Filled 2021-05-13 (×2): qty 1

## 2021-05-13 MED ORDER — LORAZEPAM 2 MG/ML IJ SOLN
1.0000 mg | Freq: Once | INTRAMUSCULAR | Status: AC
  Administered 2021-05-13: 1 mg via INTRAVENOUS
  Filled 2021-05-13: qty 1

## 2021-05-13 NOTE — Consult Note (Signed)
   Heart Failure Nurse Navigator Note  HFrEF 35 to 40%.  Moderate asymmetric LVH.  He presented from home to a local fire department and was noted to have saturations of 80 to 83% on room air.  He was then transported by EMS on CPAP to the ED.  Comorbidities:  Coronary artery disease/coronary artery bypass grafting Persistent A. fib on Tikosyn and Eliquis Diabetes Hypertension Hyperlipidemia Obesity Anxiety/panic attacks Continued tobacco abuse  Labs:  Sodium 135, potassium 3.3, chloride 99, CO2 26, BUN 13, creatinine 0.87, BNP 2012, lactic acid 3.1  Chest x-ray reveals vascular congestion and interstitial edema  Medications:  Home medications include:  aspirin 81 mg daily Lipitor 80 mg daily Farxiga 10 mg daily Tikosyn 500 mg twice a day Lasix 40 mg daily Entresto 49/51 mg twice a day  Coreg is currently being held due to symptomatic bradycardia.  Met with patient while in the emergency room.  Currently on 6 L per nasal cannula and noted that saturations dropped to 86 and 88 with conversation.  He states that his pharmacy had given him 20 mg tablets of Lasix and he knew enough to take 2 of those to make the 40 mg daily.  He also goes on to state that he does not weigh himself on a daily basis, stressed the importance of daily weight and recording and to notify his physicians with a 2 pound weight gain overnight or 5 pounds within a week.  Also to report any changes in symptoms such as increased swelling crease shortness of breath, PND or orthopnea.  He states that he had also noted that his stomach seemed larger and that it had a decrease in his appetite.  We talked about his fluid intake and he told me that he is taking anywhere from 3 to 4000 mL in daily.  I explained that that is double what he should be taking in.  He explained to me that he thought again in more fluids would help with flushing his system.  Explained the relationship of fluid restriction with being on a  water pill.  He denied any dietary indiscretions as having decreased appetite.  [place] and has a [employer] features editor.  He states that he is seeing a cardiologist that is connected with [place] is also seen another healthcare provider there for his increasing panic attacks.  He has an appointment on September 20 at 9:30 in the AM with Darylene Price in the outpatient heart failure clinic.  Pricilla Riffle RN [facility]

## 2021-05-13 NOTE — Consult Note (Signed)
ANTICOAGULATION CONSULT NOTE - Initial Consult  Pharmacy Consult for heparin infusion Indication: chest pain/ACS  Allergies  Allergen Reactions   Spironolactone Other (See Comments)    HYPERKALEMIA    Patient Measurements: Height: 6\' 1"  (185.4 cm) IBW/kg (Calculated) : 79.9 Heparin Dosing Weight: 106.3 kg   Vital Signs: Temp: 98 F (36.7 C) (09/09 0744) Temp Source: Axillary (09/09 0744) BP: 153/87 (09/09 1630) Pulse Rate: 56 (09/09 1630)  Labs: Recent Labs    05/13/21 0747 05/13/21 1006 05/13/21 1444  HGB 15.8  --   --   HCT 48.0  --   --   PLT 303  --   --   CREATININE 0.87  --   --   TROPONINIHS 43* 481* 1,362*    CrCl cannot be calculated (Unknown ideal weight.).   Medical History: Past Medical History:  Diagnosis Date   Anxiety    Chronic systolic heart failure (HCC)    a. 07/2013 Echo: Ejection fraction of 45-50% with possible inferior wall hypokinesis; b. 03/2018 Echo: EF 40-45%; c. 06/2020 Echo: EF 40-45%, glob HK. Mod LVH, gr1 DD. Nl PASP. Mild BAE. Triv MR. Mild AoV sclerosis. Mild Ao dil (35mm).   Coronary artery disease    Significant left main and Severe three-vessel coronary artery disease in December of 2014. He underwent CABG in January of 2015 with LIMA to LAD, sequential SVG to ramus/OM 2 and SVG to OM 3   Diabetes mellitus without complication (HCC)    Hyperlipidemia    Hypertension    Ischemic cardiomyopathy    a. 07/2013 Echo: EF 45-50%; b. 03/2018 Echo: EF 40-45%; c. 06/2020 Echo: EF 40-45%.   Medical non-compliance    MI (myocardial infarction) (HCC)    Obesity    PAD (peripheral artery disease) (HCC)    a. 04/2020 PTA of L PT.   PAF (paroxysmal atrial fibrillation) (HCC)    Tobacco abuse     Medications:  Apixaban 5 mg BID PTA-- last dose 05/12/21 evening   Assessment: 60 year old male with history of atrial fibrillation on apixaban 5 mg BID PTA presented with respiratory distress. Troponin 480 > 1300. Pharmacy has been consulted to  transition to heparin therapy.  Baseline HL/ aPTT ordered   Goal of Therapy:  Heparin level 0.3-0.7 units/ml aPTT 66-102 seconds Monitor platelets by anticoagulation protocol: Yes   Plan:  Give 4000 units bolus x 1 Start heparin infusion at 1500 units/hr Check anti-Xa/HL level in 6 hours and daily while on heparin Baseline HL ordered -- will determine whether to monitor heparin infusion via HL or aPTT  Continue to monitor H&H and platelets  66, PharmD, BCPS Clinical Pharmacist   05/13/2021,5:34 PM

## 2021-05-13 NOTE — Consult Note (Addendum)
Cardiology Consultation:   Patient ID: Marc [name]; [id number]; [birth year]   Admit date: 05/13/2021 Date of Consult: 05/13/2021  Primary Care Provider: [facility] Primary Cardiologist: Agbor[facility]Etang Primary Electrophysiologist:  None   Patient Profile:   Marc [name] is a 60 y.o. male with a hx of CAD s/p 5-vessel CABG in 09/2013 with LIMA to LAD, sequential SVG to ramus/OM2, and SVG to OM3, chronic combined systolic and diastolic CHF, ICM, persistent Afib s/p recent briefly successful DCCV on 02/23/2021 now on Tikosyn, PAD with left foot ray amputation and right toe amputation, DM, HTN, HLD, tobacco use, obesity, hyperkalemia on ACEi and MRA, and anxiety/panic attack disorder who is being seen today for the evaluation of CHF and elevated troponin at the request of Dr. Clyde Lundborg.  History of Present Illness:   Marc [name] was admitted with an MI in 08/2013 with LHC at that time showing multivessel CAD as outlined below with an EF of 35%. He subsequently underwent 5-vessel CABG in 09/2013. Despite surgical revascularization and medical therapy, his cardiomyopathy has persisted with echo in 03/2018 showing an EF of 40-45% with probable hypokinesis of the mid-apicalanteroseptal, anterior, and apical myocardium, Gr1DD, and mild biatrial enlargement. Most recent echo from 06/2020 showed an EF of 40-45%, global hypokinesis, moderate LVH, Gr1DD, moderately reduced RVSF with normal ventricular cavity size and mildly increased wall thickness, normal PASP, mild biatrial enlargement, trivial MR, mild aortic valve sclerosis without stenosis, mild dilatation of the transverse aorta measuring 34 mm and an estimated right atrial pressure of 3 mmHg.    He was admitted in 12/2020 with generalized weakness and found to be hyperkalemic with a potassium > 7.5, felt to be related to TMP-SMX use in combination with spironolactone and lisinopril.  He was treated with Kayexalate and advised to stop  lisinopril and spironolactone. He was readmitted in 01/2021 with nausea and vomiting, and again found to be hyperkalemic at 7.3 requiring insulin, D50, calcium gluconate, and Kayexalate. He indicated he had resumed both lisinopril and spironolactone as he was not clear how long he was to hold these medications. These medications were held again. His renal function was above baseline, and he was advised to use Lasix on a prn basis. During the above admission in 01/2021, he was found to be in Afib. He was not started on anticoagulation. He was seen in the office in late 01/2021, and remained in Afib. He was started on Eliquis.  There were concerns A. fib was driving worsening heart failure given a weight trend from 249 pounds in the office in 06/2020 with a weight of 269 pounds when he was seen on 01/21/2021.  He subsequently underwent briefly successful successful DCCV on 02/23/2021.    He was admitted to the hospital in 02/2021 with CHF exacerbation and NSTEMI with high-sensitivity troponin peaking at 3688.  BNP 04/13/2019.  Echo showed an EF of 35 to 40%, moderate asymmetric LVH of the posterior segment, poorly visualized right ventricle, and mild to moderate aortic valve sclerosis without stenosis.  R/LHC showed severe underlying left main and three-vessel CAD with patent LIMA to LAD, patent SVG to OM2/ramus, occluded SVG to OM 3, and a known small RCA that was nondominant and occluded proximally with left to right collaterals.  RHC showed mildly elevated wedge pressure at 20 mmHg with a prominent V wave, mild pulmonary hypertension, and moderately reduced cardiac output.  Medical therapy was recommended with no good options to revascularize the OM 3 and LPL distributions  given the need to stent the left main, mid LCx, and 3 branches of LCx.  The risk is felt to outweigh the benefit.  Tikosyn was initiated with subsequent conversion to sinus rhythm.  [facility] was [facility] with symptomatic bradycardia that  improved with holding carvedilol.  Hospital admission was notable for prolonged QTC, felt to be exacerbated by high-dose hydroxyzine that subsequently improved with holding of medication.  He was continued on dofetilide and apixaban.  High-sensitivity troponin negative x2.  He was noted to have poor control over his anxiety/panic attacks.  He was initiated on Lexapro.  He was seen in the [facility] ED with shortness of breath and anxiety/panic attacks.  Labs were notable for a WBC 13.2, BNP 1455, normal troponin, COVID/influenza negative, and a VBG with a PO2 of 27.  Chest x-ray felt to be reasonable early pulmonary edema.  He was given a one-time dose of IV Lasix and advised to take an increased dose of Lasix 60 mg the following day followed by resumption of 40 mg daily thereafter.  [facility] ED on [facility]/9 with progressive shortness of breath since the night prior without associated angina, cough, palpitation, PND, orthopnea, or worsening lower extremity swelling.  He realizes now his symptoms are likely in the setting of excessive fluid intake.  Labs notable for high-sensitivity troponin of 43 with a delta of 481, BNP 2012, WBC 11.6, potassium 3.3, lactic acid 3.1.  Chest x-ray consistent with vascular congestion and interstitial edema without definite pleural effusions or infiltrates.  EKG showed NSR, 80 bpm, trifascicular block with first-degree AV block, RBBB, and left posterior fascicular block, PVCs, QT 440, and nonspecific ST-T changes.  In the ED, he received IV Lasix 60 mg, DuoNeb, and Ativan.  Initially, he did require BiPAP.  However, this has been weaned to supplemental oxygen via nasal cannula at 2 L.  He notes a significant improvement in his breathing since receiving IV Lasix in the ED.  No chest pain.    Past Medical History:  Diagnosis Date   Anxiety    Chronic systolic heart failure (HCC)    a. 07/2013 Echo: Ejection fraction of 45-50% with possible inferior wall  hypokinesis; b. 03/2018 Echo: EF 40-45%; c. 06/2020 Echo: EF 40-45%, glob HK. Mod LVH, gr1 DD. Nl PASP. Mild BAE. Triv MR. Mild AoV sclerosis. Mild Ao dil (34mm).   Coronary artery disease    Significant left main and Severe three-vessel coronary artery disease in December of 2014. He underwent CABG in January of 2015 with LIMA to LAD, sequential SVG to ramus/OM 2 and SVG to OM 3   Diabetes mellitus without complication (HCC)    Hyperlipidemia    Hypertension    Ischemic cardiomyopathy    a. 07/2013 Echo: EF 45-50%; b. 03/2018 Echo: EF 40-45%; c. 06/2020 Echo: EF 40-45%.   Medical non-compliance    MI (myocardial infarction) (HCC)    Obesity    PAD (peripheral artery disease) (HCC)    a. 04/2020 PTA of L PT.   PAF (paroxysmal atrial fibrillation) (HCC)    Tobacco abuse     Past Surgical History:  Procedure Laterality Date   AMPUTATION Left 04/08/2020   Procedure: AMPUTATION RAY;  Surgeon: Rosetta PosnerBaker, Andrew, DPM;  Location: [facility] ORS;  Service: Podiatry;  Laterality: Left;   AMPUTATION Left 06/25/2020   Procedure: AMPUTATION RAY 1ST RAY PARTIAL LEFT;  Surgeon: Rosetta PosnerBaker, Andrew, DPM;  Location: [facility] ORS;  Service: Podiatry;  Laterality: Left;  AMPUTATION TOE Right 02/29/2016   Procedure: AMPUTATION TOE;  Surgeon: Linus Galas, DPM;  Location: [facility] ORS;  Service: Podiatry;  Laterality: Right;  Great toe   CARDIAC CATHETERIZATION     [facility] no stent   CARDIOVERSION N/A 02/23/2021   Procedure: CARDIOVERSION;  Surgeon: Debbe Odea, MD;  Location: [facility] ORS;  Service: Cardiovascular;  Laterality: N/A;   CORONARY ARTERY BYPASS GRAFT N/A 09/16/2013   Procedure: CORONARY ARTERY BYPASS GRAFTING (CABG);  Surgeon: [name], MD;  Location: [facility] OR;  Service: Open Heart Surgery;  Laterality: N/A;  CABG x four, using left internal mammary artery and right leg greater saphenous vein   INTRAOPERATIVE TRANSESOPHAGEAL ECHOCARDIOGRAM N/A 09/16/2013   Procedure: INTRAOPERATIVE TRANSESOPHAGEAL ECHOCARDIOGRAM;   Surgeon: [name], MD;  Location: [facility] OR;  Service: Open Heart Surgery;  Laterality: N/A;   knee surgery Right    LASIK Bilateral    LOWER EXTREMITY ANGIOGRAPHY Left 04/07/2020   Procedure: Lower Extremity Angiography;  Surgeon: Renford Dills, MD;  Location: [facility] INVASIVE CV LAB;  Service: Cardiovascular;  Laterality: Left;   LOWER EXTREMITY ANGIOGRAPHY Left 04/09/2020   Procedure: Lower Extremity Angiography (Pedal Approach);  Surgeon: Renford Dills, MD;  Location: [facility] INVASIVE CV LAB;  Service: Cardiovascular;  Laterality: Left;   RIGHT/LEFT HEART CATH AND CORONARY/GRAFT ANGIOGRAPHY N/A 03/04/2021   Procedure: RIGHT/LEFT HEART CATH AND CORONARY/GRAFT ANGIOGRAPHY;  Surgeon: Iran Ouch, MD;  Location: [facility] INVASIVE CV LAB;  Service: Cardiovascular;  Laterality: N/A;     Home Meds: Prior to Admission medications   Medication Sig Start Date End Date Taking? Authorizing Provider  acetaminophen (TYLENOL) 500 MG tablet Take 1,000 mg by mouth every 6 (six) hours as needed for moderate pain or headache.    [provider]  apixaban (ELIQUIS) 5 MG TABS tablet Take 1 tablet (5 mg total) by mouth 2 (two) times daily. 05/06/21   Debbe Odea, MD  aspirin EC 81 MG EC tablet Take 1 tablet (81 mg total) by mouth daily. Swallow whole. 03/09/21   Marrion Coy, MD  atorvastatin (LIPITOR) 80 MG tablet Take 80 mg by mouth every evening.  01/31/18   [provider]  calcium carbonate (TUMS - DOSED IN MG ELEMENTAL CALCIUM) 500 MG chewable tablet Chew 500 mg by mouth daily as needed for indigestion or heartburn.    [provider]  carvedilol (COREG) 25 MG tablet Take 25 mg by mouth 2 (two) times daily.    [provider]  dapagliflozin propanediol (FARXIGA) 10 MG TABS tablet Take 1 tablet (10 mg total) by mouth daily. 03/08/21   Marrion Coy, MD  dofetilide (TIKOSYN) 500 MCG capsule Take 1 capsule (500 mcg total) by mouth 2 (two) times daily. It is important you do  not miss any doses of this medication. Call the office if you miss 3 doses. Make sure you notify the office with plenty of time for any refills. 05/06/21   Debbe Odea, MD  furosemide (LASIX) 40 MG tablet Take 1 tablet (40 mg total) by mouth daily. 03/09/21   Marrion Coy, MD  hydrOXYzine (ATARAX/VISTARIL) 50 MG tablet Take 50 mg by mouth 4 (four) times daily as needed for anxiety. 01/29/21   [provider]  insulin aspart (NOVOLOG) 100 UNIT/ML injection Inject 10 Units into the skin 3 (three) times daily with meals. 03/08/21   Marrion Coy, MD  insulin glargine (LANTUS SOLOSTAR) 100 UNIT/ML Solostar Pen Inject 42 Units into the skin daily. 01/07/21   Rai, Ripudeep  K, MD  Naphazoline-Pheniramine (VISINE-A OP) Place 1 drop into both eyes daily as needed (allergies).    [provider]  potassium chloride (KLOR-CON) 10 MEQ tablet Take 10 mEq by mouth daily. 02/08/21   [provider]  sacubitril-valsartan (ENTRESTO) 49-51 MG Take 1 tablet by mouth 2 (two) times daily. 05/06/21   Debbe Odea, MD    Inpatient Medications: Scheduled Meds:  insulin aspart  0-5 Units Subcutaneous QHS   insulin aspart  0-9 Units Subcutaneous TID WC   potassium chloride  40 mEq Oral Q4H   Continuous Infusions:  PRN Meds: acetaminophen, albuterol, dextromethorphan-guaiFENesin, hydrALAZINE  Allergies:   Allergies  Allergen Reactions   Spironolactone Other (See Comments)    HYPERKALEMIA    Social History:   Social History   Socioeconomic History   Marital status: Divorced    Spouse name: Not on file   Number of children: Not on file   Years of education: Not on file   Highest education level: Not on file  Occupational History   Not on file  Tobacco Use   Smoking status: Former    Packs/day: 0.50    Years: 45.00    Pack years: 22.50    Types: Cigarettes    Quit date: 02/09/2021    Years since quitting: 0.2   Smokeless tobacco: Never  Vaping Use   Vaping Use: Never used   Substance and Sexual Activity   Alcohol use: No    Alcohol/week: 0.0 standard drinks   Drug use: No   Sexual activity: Not on file  Other Topics Concern   Not on file  Social History Narrative   Not on file   Social Determinants of Health   Financial Resource Strain: Not on file  Food Insecurity: Not on file  Transportation Needs: Not on file  Physical Activity: Not on file  Stress: Not on file  Social Connections: Not on file  Intimate Partner Violence: Not on file     Family History:   Family History  Problem Relation Age of Onset   Heart disease Mother     ROS:  Review of Systems  Constitutional:  Positive for malaise/fatigue. Negative for chills, diaphoresis, fever and weight loss.  HENT:  Negative for congestion.   Eyes:  Negative for discharge and redness.  Respiratory:  Positive for shortness of breath. Negative for cough, sputum production and wheezing.   Cardiovascular:  Positive for leg swelling. Negative for chest pain, palpitations, orthopnea, claudication and PND.  Gastrointestinal:  Negative for abdominal pain, blood in stool, heartburn, melena, nausea and vomiting.  Musculoskeletal:  Negative for falls and myalgias.  Skin:  Negative for rash.  Neurological:  Positive for weakness. Negative for dizziness, tingling, tremors, sensory change, speech change, focal weakness and loss of consciousness.  Endo/Heme/Allergies:  Does not bruise/bleed easily.  Psychiatric/Behavioral:  Negative for substance abuse. The patient is not nervous/anxious.   All other systems reviewed and are negative.    Physical Exam/Data:   Vitals:   05/13/21 0830 05/13/21 0900 05/13/21 0930 05/13/21 1000  BP: 116/64 109/65 114/64 121/72  Pulse: (!) 56 (!) 48 (!) 46 (!) 44  Resp: 18 18 19 19   Temp:      TempSrc:      SpO2: 100% 100% 97% 96%  Height:       No intake or output data in the 24 hours ending 05/13/21 1116 There were no vitals filed for this visit. Body mass index is  33.15 kg/m.  Physical Exam: General: Well developed, well nourished, in no acute distress. Head: Normocephalic, atraumatic, sclera non-icteric, no xanthomas, nares without discharge.  Neck: Negative for carotid bruits. JVD mildly elevated. Lungs: Diminished breath sounds along the bilateral bases with supplemental oxygen via nasal cannula at 2 L noted. Breathing is unlabored. Heart: RRR with S1 S2. No murmurs, rubs, or gallops appreciated. Abdomen: Soft, non-tender, non-distended with normoactive bowel sounds. No hepatomegaly. No rebound/guarding. No obvious abdominal masses. Msk:  Strength and tone appear normal for age. Extremities: No clubbing or cyanosis.  Trace bilateral pretibial edema edema. Distal pedal pulses are 2+ and equal bilaterally. Neuro: Alert and oriented X 3. No facial asymmetry. No focal deficit. Moves all extremities spontaneously. Psych:  Responds to questions appropriately with a normal affect.   EKG:  The EKG was personally reviewed and demonstrates: NSR, 80 bpm, trifascicular block with first-degree AV block, RBBB, and left posterior fascicular block, PVCs, QT 440, and nonspecific ST-T changes Telemetry:  Telemetry was personally reviewed and demonstrates: SR  Weights: There were no vitals filed for this visit.  Relevant CV Studies:  [facility] 03/2021: Ost RCA to Prox RCA lesion is 100% stenosed. Mid LM to Dist LM lesion is 90% stenosed. Ost LAD to Prox LAD lesion is 100% stenosed. Ost Cx to Prox Cx lesion is 50% stenosed. Mid Cx to Dist Cx lesion is 60% stenosed. 3rd Mrg lesion is 90% stenosed. 1st LPL lesion is 90% stenosed. 2nd LPL lesion is 90% stenosed. SVG graft was visualized by angiography. Prox Graft to Mid Graft lesion is 30% stenosed. Origin to Prox Graft lesion is 100% stenosed. LIMA graft was visualized by angiography and is normal in caliber. The graft exhibits no disease. Dist LAD lesion is 60% stenosed. Mid LAD lesion is 70% stenosed.   1.   Severe underlying left main and three-vessel coronary artery disease with patent LIMA to LAD and patent SVG to OM2/ramus.  Occluded SVG to OM 3.  The RCA is known to be small and nondominant and occluded proximally with some left-to-right collaterals. 2.  Left ventricular angiography was not performed.  EF was moderately reduced by echo. 3.  Right heart catheterization showed mildly elevated wedge pressure at 20 mmHg with a prominent V wave, mild pulmonary hypertension and moderately reduced cardiac output.   Recommendations: Recommend continuing medical therapy for coronary artery disease.  No good options to revascularize the OM 3 and LPL distributions given the need to stent the left main, mid left circumflex and 3 branches of the left circumflex.  Risks probably outweigh the benefit. The best option is to treat medically including optimizing heart failure and rhythm control. The patient is still volume overloaded.  Continue IV diuresis for another day and likely switch to oral diuretic tomorrow. Resume heparin drip in 6 hours and can switch to Eliquis tomorrow morning if no bleeding issues from the cath site. __________  2D echo 02/2021: 1. Left ventricular ejection fraction, by estimation, is 35 to 40%. The  left ventricle has moderately decreased function. Left ventricular  endocardial border not optimally defined to evaluate regional wall motion.  There is moderate asymmetric left  ventricular hypertrophy of the posterior segment. Left ventricular  diastolic function could not be evaluated.   2. Right ventricular systolic function was not well visualized. The right  ventricular size is not well visualized. Mildly increased right  ventricular wall thickness.   3. The mitral valve is degenerative. Unable to accurately assess mitral  valve regurgitation.   4.  The aortic valve is tricuspid. There is mild calcification of the  aortic valve. There is mild thickening of the aortic valve.  Aortic valve  regurgitation not well assessed. Mild to moderate aortic valve  sclerosis/calcification is present, without any  evidence of aortic stenosis.  __________  2D echo 06/2020: 1. Left ventricular ejection fraction, by estimation, is 40 to 45%. The  left ventricle has mildly decreased function. The left ventricle  demonstrates global hypokinesis. The left ventricular internal cavity size  was moderately dilated. There is moderate   left ventricular hypertrophy. Left ventricular diastolic parameters are  consistent with Grade I diastolic dysfunction (impaired relaxation).   2. Right ventricular systolic function is moderately reduced. The right  ventricular size is normal. Mildly increased right ventricular wall  thickness. There is normal pulmonary artery systolic pressure.   3. Left atrial size was mildly dilated.   4. Right atrial size was mildly dilated.   5. The mitral valve is grossly normal. Trivial mitral valve  regurgitation.   6. The aortic valve is tricuspid. There is mild thickening of the aortic  valve. Aortic valve regurgitation is not visualized. Mild aortic valve  sclerosis is present, with no evidence of aortic valve stenosis.   7. Aortic dilatation noted. There is mild dilatation of the transverse  aorta, measuring 34 mm.   8. The inferior vena cava is normal in size with greater than 50%  respiratory variability, suggesting right atrial pressure of 3 mmHg. __________   2D echo 03/2018: - Left ventricle: The cavity size was mildly dilated. There was    mild concentric hypertrophy. Systolic function was mildly to    moderately reduced. The estimated ejection fraction was in the    range of 40% to 45%. Probable hypokinesis of the    mid-apicalanteroseptal, anterior, and apical myocardium. Doppler    parameters are consistent with abnormal left ventricular    relaxation (grade 1 diastolic dysfunction).  - Left atrium: The atrium was mildly dilated.  - Right  atrium: The atrium was mildly dilated. __________   [facility] 08/2013: Distal left main 60%, mid LAD 80%, D3 30%, OM1 90%, left AV groove 40%, LPL1 90%, LPL2 90%, RCA 100%. EF 35%. Recommendation: CABG.    Laboratory Data:  Chemistry Recent Labs  Lab 05/13/21 0747  NA 135  K 3.3*  CL 99  CO2 26  GLUCOSE 170*  BUN 13  CREATININE 0.87  CALCIUM 9.7  GFRNONAA >60  ANIONGAP 10    Recent Labs  Lab 05/13/21 0747  PROT 7.4  ALBUMIN 4.1  AST 20  ALT 19  ALKPHOS 106  BILITOT 1.1   Hematology Recent Labs  Lab 05/13/21 0747  WBC 11.6*  RBC 5.23  HGB 15.8  HCT 48.0  MCV 91.8  MCH 30.2  MCHC 32.9  RDW 15.9*  PLT 303   Cardiac EnzymesNo results for input(s): TROPONINI in the last 168 hours. No results for input(s): TROPIPOC in the last 168 hours.  BNP Recent Labs  Lab 05/13/21 0747  BNP 2,012.2*    DDimer No results for input(s): DDIMER in the last 168 hours.  Radiology/Studies:  DG Chest Portable 1 View  Result Date: 05/13/2021 IMPRESSION: CHF. Electronically Signed   By: Rudie Meyer M.D.   On: 05/13/2021 08:15    Assessment and Plan:   1.  CAD involving the native coronary arteries and bypass grafts with NSTEMI: -No chest pain -High-sensitivity troponin currently trending to 481, continue to cycle until peak -Elevated troponin  is likely supply demand ischemia in the setting of known severe multivessel CAD with volume overload -Defer initiation of heparin drip at this time without dynamic troponin elevation -Recent LHC in 03/2021 demonstrated severe underlying multivessel CAD as outlined above with recommendation to continue medical therapy given that there were no good targets for revascularization and risk was felt to outweigh benefit -Continue ASA with known severe multivessel CAD -Aggressive risk factor modification and secondary prevention recommended  2.  Acute on chronic combined systolic and diastolic CHF/ICM: -Appears to be in the setting of excessive  water intake -Agree with IV diuresis -PTA diuretic may need to be escalated at discharge, this will be deferred to his primary cardiologist -Carvedilol was discontinued by [facility] in 04/2021 with symptomatic bradycardia, would consider reinitiating carvedilol at lower dose such as 3.125 mg twice daily upon admission -PTA Farxiga and Entresto -No longer on BiDil secondary to relative hypotension  3.  Persistent A. fib: -Currently maintaining sinus rhythm with Tikosyn with QT 440 -No longer on beta-blocker following admission to [facility] in 04/2021 secondary to symptomatic bradycardia -Given patient's underlying cardiomyopathy, recommend reinitiating low-dose carvedilol upon admission -PTA dofetilide -CHA2DS2-VASc at least 4 (CHF, HTN, DM, vascular disease) -Continue PTA apixaban -Hydroxyzine discontinued by [facility] in 04/2021, as this was felt to be contributing to QT prolongation with noted improvement in QT following discontinuation of medication   4.  HTN: -Blood pressure mildly elevated in the ED -Recommend IV diuresis and reinitiation of GDMT as outlined above upon admission  5.  HLD: -LDL 40 -PTA atorvastatin should be continued at admission   Patient indicated at the end of his consult, he is now followed by Dr. Welton Flakes with [facility] and was scheduled for a stress test at his office this morning.  Will need to verify this in rounds as he was recently seen by our office as well.      For questions or updates, please contact [facility] Please consult [email or web address] for contact info under Cardiology/STEMI.   Signed, Eula Listen, PA-C [facility] Pager: [phone] 05/13/2021, 11:16 AM

## 2021-05-13 NOTE — H&P (Signed)
History and Physical    Marc [name] JJK:[id number] DOB: [birth year] DOA: 05/13/2021  Referring MD/NP/PA:   PCP: [facility]   Patient coming from:  The patient is coming from home.  At baseline, pt is independent for most of ADL.        Chief Complaint: SOB  HPI: Marc [name] is a 60 y.o. male with medical history significant of  hypertension, hyperlipidemia, diabetes mellitus, tobacco abuse, CAD, CABG, sCHF with EF of 35-40%, ischemic colitis, aortic aneurysm with dissection, atrial fibrillation on Eliquis, CKD stage IIIa, anxiety, left foot ray amputation, right toe amputation, who presents SOB.  Patient states that his shortness has been progressively worsening since last night.  Patient has dry cough, no chest pain.  No fever or chills.  Denies nausea, vomiting, diarrhea or abdominal pain.  No symptoms of UTI. Patient was found to have severe respiratory distress, with oxygen saturation 83% on room air, BiPAP is started.  He is not using oxygen at home normally.  ED Course: pt was found to have BNP of 2012, troponin level 43 --> 481, lactic acid 3.1, procalcitonin <0.10, negative COVID PCR, renal function stable, potassium 3.3, temperature normal, blood pressure 121/72, heart rate 77, 44, RR 27.  Chest x-ray showed vascular congestion and interstitial pulmonary edema.  ABG with a pH of 7.39, CO2 44, O2 156. Patient is admitted to progressive bed as inpatient. Dr. Mariah Milling of card is consulted.   Review of Systems:   General: no fevers, chills, no body weight gain, has fatigue HEENT: no blurry vision, hearing changes or sore throat Respiratory: has dyspnea, coughing,  no wheezing CV: no chest pain, no palpitations GI: no nausea, vomiting, abdominal pain, diarrhea, constipation GU: no dysuria, burning on urination, increased urinary frequency, hematuria  Ext: has leg edema Neuro: no unilateral weakness, numbness, or tingling, no vision change or hearing loss Skin:  no rash, no skin tear. MSK: No muscle spasm, no deformity, no limitation of range of movement in spin Heme: No easy bruising.  Travel history: No recent long distant travel.  Allergy:  Allergies  Allergen Reactions   Spironolactone Other (See Comments)    HYPERKALEMIA    Past Medical History:  Diagnosis Date   Anxiety    Chronic systolic heart failure (HCC)    a. 07/2013 Echo: Ejection fraction of 45-50% with possible inferior wall hypokinesis; b. 03/2018 Echo: EF 40-45%; c. 06/2020 Echo: EF 40-45%, glob HK. Mod LVH, gr1 DD. Nl PASP. Mild BAE. Triv MR. Mild AoV sclerosis. Mild Ao dil (66mm).   Coronary artery disease    Significant left main and Severe three-vessel coronary artery disease in December of 2014. He underwent CABG in January of 2015 with LIMA to LAD, sequential SVG to ramus/OM 2 and SVG to OM 3   Diabetes mellitus without complication (HCC)    Hyperlipidemia    Hypertension    Ischemic cardiomyopathy    a. 07/2013 Echo: EF 45-50%; b. 03/2018 Echo: EF 40-45%; c. 06/2020 Echo: EF 40-45%.   Medical non-compliance    MI (myocardial infarction) (HCC)    Obesity    PAD (peripheral artery disease) (HCC)    a. 04/2020 PTA of L PT.   PAF (paroxysmal atrial fibrillation) (HCC)    Tobacco abuse     Past Surgical History:  Procedure Laterality Date   AMPUTATION Left 04/08/2020   Procedure: AMPUTATION RAY;  Surgeon: Rosetta Posner, DPM;  Location: [facility];  Service: Podiatry;  Laterality: Left;  AMPUTATION Left 06/25/2020   Procedure: AMPUTATION RAY 1ST RAY PARTIAL LEFT;  Surgeon: Rosetta Posner, DPM;  Location: [facility];  Service: Podiatry;  Laterality: Left;   AMPUTATION TOE Right 02/29/2016   Procedure: AMPUTATION TOE;  Surgeon: Linus Galas, DPM;  Location: [facility];  Service: Podiatry;  Laterality: Right;  Great toe   CARDIAC CATHETERIZATION     [facility] no stent   CARDIOVERSION N/A 02/23/2021   Procedure: CARDIOVERSION;  Surgeon: Debbe Odea, MD;  Location: [facility];   Service: Cardiovascular;  Laterality: N/A;   CORONARY ARTERY BYPASS GRAFT N/A 09/16/2013   Procedure: CORONARY ARTERY BYPASS GRAFTING (CABG);  Surgeon: [name], MD;  Location: [facility];  Service: Open Heart Surgery;  Laterality: N/A;  CABG x four, using left internal mammary artery and right leg greater saphenous vein   INTRAOPERATIVE TRANSESOPHAGEAL ECHOCARDIOGRAM N/A 09/16/2013   Procedure: INTRAOPERATIVE TRANSESOPHAGEAL ECHOCARDIOGRAM;  Surgeon: [name], MD;  Location: [facility] OR;  Service: Open Heart Surgery;  Laterality: N/A;   knee surgery Right    LASIK Bilateral    LOWER EXTREMITY ANGIOGRAPHY Left 04/07/2020   Procedure: Lower Extremity Angiography;  Surgeon: Renford Dills, MD;  Location: [facility];  Service: Cardiovascular;  Laterality: Left;   LOWER EXTREMITY ANGIOGRAPHY Left 04/09/2020   Procedure: Lower Extremity Angiography (Pedal Approach);  Surgeon: Renford Dills, MD;  Location: [facility];  Service: Cardiovascular;  Laterality: Left;   RIGHT/LEFT HEART CATH AND CORONARY/GRAFT ANGIOGRAPHY N/A 03/04/2021   Procedure: RIGHT/LEFT HEART CATH AND CORONARY/GRAFT ANGIOGRAPHY;  Surgeon: Iran Ouch, MD;  Location: [facility];  Service: Cardiovascular;  Laterality: N/A;    Social History:  reports that he quit smoking about 3 months ago. His smoking use included cigarettes. He has a 22.50 pack-year smoking history. He has never used smokeless tobacco. He reports that he does not drink alcohol and does not use drugs.  Family History:  Family History  Problem Relation Age of Onset   Heart disease Mother      Prior to Admission medications   Medication Sig Start Date End Date Taking? Authorizing Provider  acetaminophen (TYLENOL) 500 MG tablet Take 1,000 mg by mouth every 6 (six) hours as needed for moderate pain or headache.    [provider]  apixaban (ELIQUIS) 5 MG TABS tablet Take 1 tablet (5 mg total) by mouth 2 (two) times  daily. 05/06/21   Debbe Odea, MD  aspirin EC 81 MG EC tablet Take 1 tablet (81 mg total) by mouth daily. Swallow whole. 03/09/21   Marrion Coy, MD  atorvastatin (LIPITOR) 80 MG tablet Take 80 mg by mouth every evening.  01/31/18   [provider]  calcium carbonate (TUMS - DOSED IN MG ELEMENTAL CALCIUM) 500 MG chewable tablet Chew 500 mg by mouth daily as needed for indigestion or heartburn.    [provider]  carvedilol (COREG) 25 MG tablet Take 25 mg by mouth 2 (two) times daily.    [provider]  dapagliflozin propanediol (FARXIGA) 10 MG TABS tablet Take 1 tablet (10 mg total) by mouth daily. 03/08/21   Marrion Coy, MD  dofetilide (TIKOSYN) 500 MCG capsule Take 1 capsule (500 mcg total) by mouth 2 (two) times daily. It is important you do not miss any doses of this medication. Call the office if you miss 3 doses. Make sure you notify the office with plenty of time for any refills. 05/06/21   Debbe Odea, MD  furosemide (LASIX) 40 MG tablet Take 1 tablet (40 mg total) by mouth daily. 03/09/21   Marrion Coy, MD  hydrOXYzine (ATARAX/VISTARIL) 50 MG tablet Take 50 mg by mouth 4 (four) times daily as needed for anxiety. 01/29/21   [provider]  insulin aspart (NOVOLOG) 100 UNIT/ML injection Inject 10 Units into the skin 3 (three) times daily with meals. 03/08/21   Marrion Coy, MD  insulin glargine (LANTUS SOLOSTAR) 100 UNIT/ML Solostar Pen Inject 42 Units into the skin daily. 01/07/21   Rai, Delene Ruffini, MD  Naphazoline-Pheniramine (VISINE-A OP) Place 1 drop into both eyes daily as needed (allergies).    [provider]  potassium chloride (KLOR-CON) 10 MEQ tablet Take 10 mEq by mouth daily. 02/08/21   [provider]  sacubitril-valsartan (ENTRESTO) 49-51 MG Take 1 tablet by mouth 2 (two) times daily. 05/06/21   Debbe Odea, MD    Physical Exam: Vitals:   05/13/21 1405 05/13/21 1520 05/13/21 1630 05/13/21 1747  BP: (!) 155/86  (!)  153/87   Pulse: 60 60 (!) 56   Resp: (!) 22  (!) 21   Temp:      TempSrc:      SpO2: 95% 99% 98%   Weight:    121.2 kg  Height:       General: Not in acute distress HEENT:       Eyes: PERRL, EOMI, no scleral icterus.       ENT: No discharge from the ears and nose, no pharynx injection, no tonsillar enlargement.        Neck: positive JVD, no bruit, no mass felt. Heme: No neck lymph node enlargement. Cardiac: S1/S2, RRR, No murmurs, No gallops or rubs. Respiratory: has fine crackles bilaterally GI: Soft, nondistended, nontender, no rebound pain, no organomegaly, BS present. GU: No hematuria Ext: 1+ pitting leg edema bilaterally. 1+DP/PT pulse bilaterally. Musculoskeletal: No joint deformities, No joint redness or warmth, no limitation of ROM in spin. Skin: No rashes.  Neuro: Alert, oriented X3, cranial nerves II-XII grossly intact, moves all extremities normally.  Psych: Patient is not psychotic, no suicidal or hemocidal ideation.  Labs on Admission: I have personally reviewed following labs and imaging studies  CBC: Recent Labs  Lab 05/13/21 0747  WBC 11.6*  NEUTROABS 8.8*  HGB 15.8  HCT 48.0  MCV 91.8  PLT 303   Basic Metabolic Panel: Recent Labs  Lab 05/13/21 0747  NA 135  K 3.3*  CL 99  CO2 26  GLUCOSE 170*  BUN 13  CREATININE 0.87  CALCIUM 9.7  MG 2.2   GFR: Estimated Creatinine Clearance: 120 mL/min (by C-G formula based on SCr of 0.87 mg/dL). Liver Function Tests: Recent Labs  Lab 05/13/21 0747  AST 20  ALT 19  ALKPHOS 106  BILITOT 1.1  PROT 7.4  ALBUMIN 4.1   No results for input(s): LIPASE, AMYLASE in the last 168 hours. No results for input(s): AMMONIA in the last 168 hours. Coagulation Profile: No results for input(s): INR, PROTIME in the last 168 hours. Cardiac Enzymes: No results for input(s): CKTOTAL, CKMB, CKMBINDEX, TROPONINI in the last 168 hours. BNP (last 3 results) No results for input(s): PROBNP in the last 8760  hours. HbA1C: No results for input(s): HGBA1C in the last 72 hours. CBG: No results for input(s): GLUCAP in the last 168 hours. Lipid Profile: No results for input(s): CHOL, HDL, LDLCALC, TRIG, CHOLHDL, LDLDIRECT in the last 72 hours. Thyroid Function Tests: No results for input(s): TSH, T4TOTAL, FREET4,  T3FREE, THYROIDAB in the last 72 hours. Anemia Panel: No results for input(s): VITAMINB12, FOLATE, FERRITIN, TIBC, IRON, RETICCTPCT in the last 72 hours. Urine analysis:    Component Value Date/Time   COLORURINE YELLOW (A) 01/06/2021 1200   APPEARANCEUR CLEAR (A) 01/06/2021 1200   APPEARANCEUR Clear 09/18/2014 2018   LABSPEC 1.013 01/06/2021 1200   LABSPEC 1.005 09/18/2014 2018   PHURINE 7.0 01/06/2021 1200   GLUCOSEU 50 (A) 01/06/2021 1200   GLUCOSEU 150 mg/dL 16/10/960401/15/2016 54092018   HGBUR NEGATIVE 01/06/2021 1200   BILIRUBINUR NEGATIVE 01/06/2021 1200   BILIRUBINUR Negative 09/18/2014 2018   KETONESUR NEGATIVE 01/06/2021 1200   PROTEINUR NEGATIVE 01/06/2021 1200   UROBILINOGEN 0.2 09/12/2013 1555   NITRITE NEGATIVE 01/06/2021 1200   LEUKOCYTESUR NEGATIVE 01/06/2021 1200   LEUKOCYTESUR Negative 09/18/2014 2018   Sepsis Labs: @LABRCNTIP (procalcitonin:4,lacticidven:4) ) Recent Results (from the past 240 hour(s))  Resp Panel by RT-PCR (Flu A&B, Covid) Nasopharyngeal Swab     Status: None   Collection Time: 05/13/21  7:47 AM   Specimen: Nasopharyngeal Swab; Nasopharyngeal(NP) swabs in vial transport medium  Result Value Ref Range Status   SARS Coronavirus 2 by RT PCR NEGATIVE NEGATIVE Final    Comment: (NOTE) SARS-CoV-2 target nucleic acids are NOT DETECTED.  The SARS-CoV-2 RNA is generally detectable in upper respiratory specimens during the acute phase of infection. The lowest concentration of SARS-CoV-2 viral copies this assay can detect is 138 copies/mL. A negative result does not preclude SARS-Cov-2 infection and should not be used as the sole basis for treatment or other  patient management decisions. A negative result may occur with  improper specimen collection/handling, submission of specimen other than nasopharyngeal swab, presence of viral mutation(s) within the areas targeted by this assay, and inadequate number of viral copies(<138 copies/mL). A negative result must be combined with clinical observations, patient history, and epidemiological information. The expected result is Negative.  Fact Sheet for Patients:  BloggerCourse.comhttps://www.fda.gov/media/152166/download  Fact Sheet for Healthcare Providers:  SeriousBroker.ithttps://www.fda.gov/media/152162/download  This test is no t yet approved or cleared by the [place] FDA and  has been authorized for detection and/or diagnosis of SARS-CoV-2 by FDA under an Emergency Use Authorization (EUA). This EUA will remain  in effect (meaning this test can be used) for the duration of the COVID-19 declaration under Section 564(b)(1) of the Act, 21 U.S.C.section 360bbb-3(b)(1), unless the authorization is terminated  or revoked sooner.       Influenza A by PCR NEGATIVE NEGATIVE Final   Influenza B by PCR NEGATIVE NEGATIVE Final    Comment: (NOTE) The Xpert Xpress SARS-CoV-2/FLU/RSV plus assay is intended as an aid in the diagnosis of influenza from Nasopharyngeal swab specimens and should not be used as a sole basis for treatment. Nasal washings and aspirates are unacceptable for Xpert Xpress SARS-CoV-2/FLU/RSV testing.  Fact Sheet for Patients: BloggerCourse.comhttps://www.fda.gov/media/152166/download  Fact Sheet for Healthcare Providers: SeriousBroker.ithttps://www.fda.gov/media/152162/download  This test is not yet approved or cleared by the [place] FDA and has been authorized for detection and/or diagnosis of SARS-CoV-2 by FDA under an Emergency Use Authorization (EUA). This EUA will remain in effect (meaning this test can be used) for the duration of the COVID-19 declaration under Section 564(b)(1) of the Act, 21 U.S.C. section  360bbb-3(b)(1), unless the authorization is terminated or revoked.  Performed at [facility], [address] [place], [place] [id number]      Radiological Exams on Admission: DG Chest Portable 1 View  Result Date: 05/13/2021 CLINICAL DATA:  Respiratory distress. EXAM:  PORTABLE CHEST 1 VIEW COMPARISON:  Chest x-ray 01/2021 FINDINGS: The heart is mildly enlarged but stable. Stable surgical changes cardiac surgery. Mild tortuosity and calcification of the thoracic aorta is again noted. Vascular congestion and interstitial edema suggesting CHF. No definite pleural effusions. No focal infiltrates. IMPRESSION: CHF. Electronically Signed   By: Rudie Meyer M.D.   On: 05/13/2021 08:15     EKG: I have personally reviewed.  Sinus rhythm, QTC 508, frequent PVC, poor R wave progression, Q wave in lead III  Assessment/Plan Principal Problem:   Acute on chronic systolic CHF (congestive heart failure) (HCC) Active Problems:   Hypertension   Hyperlipidemia   CAD (coronary artery disease)   Atrial fibrillation (HCC)   Type II diabetes mellitus with renal manifestations (HCC)   CKD (chronic kidney disease), stage IIIa   Anxiety   Hypokalemia   NSTEMI (non-ST elevated myocardial infarction) (HCC)   Acute respiratory failure with hypoxia (HCC)   Elevated lactic acid level   Leukocytosis  Acute respiratory failure with hypoxia due to acute on chronic systolic CHF (congestive heart failure) (HCC): 2D echo on 03/01/2021 showed EF of 35-40%.  Patient has positive JVD, 1+ leg edema, crackles on auscultation, elevated BNP 2012, clinically consistent with CHF exacerbation. Consulted Dr. Mariah Milling of card   -Will admit to progressive unit as inpatient - BiPAP --> off BiPAP now -Lasix 40 mg bid by IV (pt received 60 mg of Lasix in ED) -Patient is on Entresto -Daily weights -strict I/O's -Low salt diet -Fluid restriction -Obtain REDs Vest reading  CAD and NSTEMI: trop 43 --> 481 --> 1362. S/p  of CABG.  -IV heparin -Trend troponin -Aspirin, Lipitor -Check A1c, FLP  Hypertension -IV hydralazine as needed -Patient is on IV Lasix  Hyperlipidemia -Lipitor  Atrial fibrillation (HCC) -Tikosyn -Switched Eliquis to IV heparin  Type II diabetes mellitus with renal manifestations [facility]): Recent A1c 7.8, poorly controlled.  Patient still taking NovoLog, Farxiga, Lantus -Sliding scale insulin -Decrease Lantus dose from 50 to 30 unit daily  CKD (chronic kidney disease), stage IIIa: Stable -Follow-up with BMP  Anxiety and depression: -Continue home medications  Hypokalemia: Potassium 3.3, magnesium 2.2 -Repleted potassium  Elevated lactic acid level: Lactic acid 3.1, no fever, no source of infection identified, likely due to hypoxia. -Trend lactic acid level  Leukocytosis: Mild, WBC 11.6, no source of infection, likely reactive. -Follow-up with CBC         DVT ppx: on  IV heparin Code Status: Full code Family Communication: not done, no family member is at bed side.    Disposition Plan:  Anticipate discharge back to previous environment Consults called:  Dr. Mariah Milling of card Admission status and Level of care: Progressive Cardiac:   progressive unit  as inpt         Status is: Inpatient  Remains inpatient appropriate because:Inpatient level of care appropriate due to severity of illness  Dispo: The patient is from: Home              Anticipated d/c is to: Home              Patient currently is not medically stable to d/c.   Difficult to place patient No         Date of Service 05/13/2021    Lorretta Harp [facility]   If 7PM-7AM, please contact night-coverage [email or web address] 05/13/2021, 6:19 PM

## 2021-05-13 NOTE — ED Notes (Signed)
Pt resting at the end of the bed at this time. Pt insists that's how he sues urinal. Pt currently on [place] at 6L/min and pt has been talking on the phone the majority of the time he has been on a [employer].

## 2021-05-13 NOTE — ED Notes (Signed)
Pt presents to ED via EMS with reps distress. Per EMS pt drove himself to the fire dept where they found his oxygen level to be 83% on RA. EMS was called to pick pt up from fire dept and was brought here on c-pap with a 02 sat in the 90's with an increased RR in the 30'.   Pt states HX of CHF and takes diuretics and has not missed any doses. Pt placed on bipap and currently at 60% FiO2 and pt looks better as well as lower RR.   Pt denies chest pain to this RN. Pt is A&Ox4. Pt stated he started feeling Sob last night but states it was worse this morning. Pt denies fevers or chills.

## 2021-05-13 NOTE — Progress Notes (Signed)
This RN spoke to ED CN and updated that we will be taking patient, but RN is backed up due to many admissions and transfers at once. AD Judeth Cornfield updated as well.

## 2021-05-13 NOTE — Progress Notes (Signed)
    Patient has been placed on heparin drip given dynamic elevation of troponin to 1362.  He will need 48 hours of heparin drip.  I have asked the admitting team to ensure the patient's Eliquis is held at time of admission.

## 2021-05-13 NOTE — ED Notes (Signed)
Dr. Clyde Lundborg notified trop went up to 1362 from 431. Lab called to tell me this pt's lactic acid went down to 2.0 but did not call the repeat trop critical value to this RN.

## 2021-05-13 NOTE — ED Notes (Signed)
Pt is currently resting and RR is improving at this time. Pt currently at 50% fiO2, pt states he feels better at this time.

## 2021-05-13 NOTE — ED Provider Notes (Signed)
[facility] Emergency Department Provider Note ____________________________________________   Event Date/Time   First MD Initiated Contact with Patient 05/13/21 [phone]     (approximate)  I have reviewed the triage vital signs and the nursing notes.  HISTORY  Chief Complaint Respiratory Distress   HPI Marc [name] is a 60 y.o. malewho presents to the ED for evaluation of resp distress.   Chart review indicates patient seen last week at [facility] ED due to CHF and panic attack, discharged.  Admitted 1 month ago at [facility] due to symptomatic bradycardia versus anxiety. History of A. fib on Eliquis and Tikosyn, DM, HTN, HLD and CAD s/p CABG x4 in 2014.  History of dissecting abdominal aortic aneurysm.  CHF with EF of about 40%.   Patient presents to the ED via EMS for respiratory distress.  He reports increasing shortness of breath throughout the night last night.  He drove himself to a local fire department where he was found to have sats of 80-83% on room air.  EMS placed on CPAP and brings to the ED.  History somewhat limited due to his presenting respiratory distress and shortness of breath.  Denies any pain, fevers or falls.  Denies chest pain, "just breathing."  He is requesting "something for anxiety."  Past Medical History:  Diagnosis Date  . Anxiety   . Chronic systolic heart failure (HCC)    a. 07/2013 Echo: Ejection fraction of 45-50% with possible inferior wall hypokinesis; b. 03/2018 Echo: EF 40-45%; c. 06/2020 Echo: EF 40-45%, glob HK. Mod LVH, gr1 DD. Nl PASP. Mild BAE. Triv MR. Mild AoV sclerosis. Mild Ao dil (79mm).  . Coronary artery disease    Significant left main and Severe three-vessel coronary artery disease in December of 2014. He underwent CABG in January of 2015 with LIMA to LAD, sequential SVG to ramus/OM 2 and SVG to OM 3  . Diabetes mellitus without complication (HCC)   . Hyperlipidemia   . Hypertension   . Ischemic cardiomyopathy     a. 07/2013 Echo: EF 45-50%; b. 03/2018 Echo: EF 40-45%; c. 06/2020 Echo: EF 40-45%.  . Medical non-compliance   . MI (myocardial infarction) (HCC)   . Obesity   . PAD (peripheral artery disease) (HCC)    a. 04/2020 PTA of L PT.  Marland Kitchen PAF (paroxysmal atrial fibrillation) (HCC)   . Tobacco abuse     Patient Active Problem List   Diagnosis Date Noted  . Secondary hypercoagulable state (HCC) 03/15/2021  . CHF, acute on chronic (HCC) 03/01/2021  . Anxiety   . Hypokalemia   . NSTEMI (non-ST elevated myocardial infarction) (HCC)   . Type II diabetes mellitus with renal manifestations (HCC) 01/06/2021  . Tobacco abuse 01/06/2021  . CKD (chronic kidney disease), stage IIIa 01/06/2021  . Hyponatremia 01/06/2021  . Elevated troponin 01/06/2021  . Nausea & vomiting 01/06/2021  . Generalized weakness   . Hyperkalemia 11/20/2020  . Cellulitis and abscess of toe of left foot 04/05/2020  . Diabetes (HCC) 03/14/2018  . Blurred vision 03/13/2018  . Cellulitis of toe, right 02/29/2016  . Atherosclerosis of native arteries of the extremities with ulceration (HCC) 10/21/2014  . Acute ischemic colitis (HCC) 09/19/2014  . Aortic aneurysm with dissection (HCC) 09/19/2014  . Type 2 diabetes mellitus without complication (HCC) 09/19/2014  . Encounter for therapeutic drug monitoring 09/26/2013  . CAD (coronary artery disease) 09/16/2013  . Chronic coronary artery disease 09/16/2013  . Coronary artery disease   . PAF (paroxysmal  atrial fibrillation) (HCC) 07/29/2013  . Atrial fibrillation (HCC) 07/29/2013  . Hypertension   . Hyperlipidemia   . Acute on chronic systolic CHF (congestive heart failure) (HCC) 07/05/2013    Past Surgical History:  Procedure Laterality Date  . AMPUTATION Left 04/08/2020   Procedure: AMPUTATION RAY;  Surgeon: Rosetta Posner, DPM;  Location: [facility] ORS;  Service: Podiatry;  Laterality: Left;  . AMPUTATION Left 06/25/2020   Procedure: AMPUTATION RAY 1ST RAY PARTIAL LEFT;   Surgeon: Rosetta Posner, DPM;  Location: [facility] ORS;  Service: Podiatry;  Laterality: Left;  . AMPUTATION TOE Right 02/29/2016   Procedure: AMPUTATION TOE;  Surgeon: Linus Galas, DPM;  Location: [facility] ORS;  Service: Podiatry;  Laterality: Right;  Great toe  . CARDIAC CATHETERIZATION     [facility] no stent  . CARDIOVERSION N/A 02/23/2021   Procedure: CARDIOVERSION;  Surgeon: Debbe Odea, MD;  Location: [facility] ORS;  Service: Cardiovascular;  Laterality: N/A;  . CORONARY ARTERY BYPASS GRAFT N/A 09/16/2013   Procedure: CORONARY ARTERY BYPASS GRAFTING (CABG);  Surgeon: [name], MD;  Location: [facility] OR;  Service: Open Heart Surgery;  Laterality: N/A;  CABG x four, using left internal mammary artery and right leg greater saphenous vein  . INTRAOPERATIVE TRANSESOPHAGEAL ECHOCARDIOGRAM N/A 09/16/2013   Procedure: INTRAOPERATIVE TRANSESOPHAGEAL ECHOCARDIOGRAM;  Surgeon: [name], MD;  Location: [facility] OR;  Service: Open Heart Surgery;  Laterality: N/A;  . knee surgery Right   . LASIK Bilateral   . LOWER EXTREMITY ANGIOGRAPHY Left 04/07/2020   Procedure: Lower Extremity Angiography;  Surgeon: Renford Dills, MD;  Location: [facility] INVASIVE CV LAB;  Service: Cardiovascular;  Laterality: Left;  . LOWER EXTREMITY ANGIOGRAPHY Left 04/09/2020   Procedure: Lower Extremity Angiography (Pedal Approach);  Surgeon: Renford Dills, MD;  Location: [facility] INVASIVE CV LAB;  Service: Cardiovascular;  Laterality: Left;  . RIGHT/LEFT HEART CATH AND CORONARY/GRAFT ANGIOGRAPHY N/A 03/04/2021   Procedure: RIGHT/LEFT HEART CATH AND CORONARY/GRAFT ANGIOGRAPHY;  Surgeon: Iran Ouch, MD;  Location: [facility] INVASIVE CV LAB;  Service: Cardiovascular;  Laterality: N/A;    Prior to Admission medications   Medication Sig Start Date End Date Taking? Authorizing Provider  acetaminophen (TYLENOL) 500 MG tablet Take 1,000 mg by mouth every 6 (six) hours as needed for moderate pain or headache.    [provider]  apixaban  (ELIQUIS) 5 MG TABS tablet Take 1 tablet (5 mg total) by mouth 2 (two) times daily. 05/06/21   Debbe Odea, MD  aspirin EC 81 MG EC tablet Take 1 tablet (81 mg total) by mouth daily. Swallow whole. 03/09/21   Marrion Coy, MD  atorvastatin (LIPITOR) 80 MG tablet Take 80 mg by mouth every evening.  01/31/18   [provider]  calcium carbonate (TUMS - DOSED IN MG ELEMENTAL CALCIUM) 500 MG chewable tablet Chew 500 mg by mouth daily as needed for indigestion or heartburn.    [provider]  carvedilol (COREG) 25 MG tablet Take 25 mg by mouth 2 (two) times daily.    [provider]  dapagliflozin propanediol (FARXIGA) 10 MG TABS tablet Take 1 tablet (10 mg total) by mouth daily. 03/08/21   Marrion Coy, MD  dofetilide (TIKOSYN) 500 MCG capsule Take 1 capsule (500 mcg total) by mouth 2 (two) times daily. It is important you do not miss any doses of this medication. Call the office if you miss 3 doses. Make sure you notify the office with plenty of time for any refills. 05/06/21   Debbe Odea,  MD  furosemide (LASIX) 40 MG tablet Take 1 tablet (40 mg total) by mouth daily. 03/09/21   Marrion CoyZhang, Dekui, MD  hydrOXYzine (ATARAX/VISTARIL) 50 MG tablet Take 50 mg by mouth 4 (four) times daily as needed for anxiety. 01/29/21   [provider]  insulin aspart (NOVOLOG) 100 UNIT/ML injection Inject 10 Units into the skin 3 (three) times daily with meals. 03/08/21   Marrion CoyZhang, Dekui, MD  insulin glargine (LANTUS SOLOSTAR) 100 UNIT/ML Solostar Pen Inject 42 Units into the skin daily. 01/07/21   Rai, Delene Ruffiniipudeep K, MD  Naphazoline-Pheniramine (VISINE-A OP) Place 1 drop into both eyes daily as needed (allergies).    [provider]  potassium chloride (KLOR-CON) 10 MEQ tablet Take 10 mEq by mouth daily. 02/08/21   [provider]  sacubitril-valsartan (ENTRESTO) 49-51 MG Take 1 tablet by mouth 2 (two) times daily. 05/06/21   Debbe OdeaAgbor-Etang, Brian, MD     Allergies Spironolactone  Family History  Problem Relation Age of Onset  . Heart disease Mother     Social History Social History   Tobacco Use  . Smoking status: Former    Packs/day: 0.50    Years: 45.00    Pack years: 22.50    Types: Cigarettes    Quit date: 02/09/2021    Years since quitting: 0.2  . Smokeless tobacco: Never  Vaping Use  . Vaping Use: Never used  Substance Use Topics  . Alcohol use: No    Alcohol/week: 0.0 standard drinks  . Drug use: No    Review of Systems  Unable to be accurately assessed due to acuity of condition and respiratory distress. ____________________________________________   PHYSICAL EXAM:  VITAL SIGNS: Vitals:   05/13/21 0930 05/13/21 1000  BP: 114/64 121/72  Pulse: (!) 46 (!) 44  Resp: 19 19  Temp:    SpO2: 97% 96%     Constitutional: Alert and oriented.  Sitting upright in bed on BiPAP.  Follows simple commands in all 4 extremities. Eyes: Conjunctivae are normal. PERRL. EOMI. Head: Atraumatic. Nose: No congestion/rhinnorhea. Mouth/Throat: Mucous membranes are moist.  Oropharynx non-erythematous. Neck: No stridor. No cervical spine tenderness to palpation. Cardiovascular: Normal rate, regular rhythm. Grossly normal heart sounds.  Good peripheral circulation. Respiratory: Tachypneic to the upper 20s.  Bibasilar crackles are present and mild scattered diffuse expiratory wheezes. Gastrointestinal: Soft , nondistended, nontender to palpation. No CVA tenderness. Musculoskeletal:  No joint effusions. No signs of acute trauma.. S/p great toe amputations bilaterally with clean and well-healed incision site. Pitting edema to bilateral lower extremities symmetrically up to the knees. Neurologic:  No gross focal neurologic deficits are appreciated.  Follow simple commands in all 4 extremities. Skin:  Skin is warm, dry and intact. No rash noted. Psychiatric: Mood and affect are anxious. Speech and behavior are  normal.  ____________________________________________   LABS (all labs ordered are listed, but only abnormal results are displayed)  Labs Reviewed  COMPREHENSIVE METABOLIC PANEL - Abnormal; Notable for the following components:      Result Value   Potassium 3.3 (*)    Glucose, Bld 170 (*)    All other components within normal limits  CBC WITH DIFFERENTIAL/PLATELET - Abnormal; Notable for the following components:   WBC 11.6 (*)    RDW 15.9 (*)    Neutro Abs 8.8 (*)    All other components within normal limits  BRAIN NATRIURETIC PEPTIDE - Abnormal; Notable for the following components:   B Natriuretic Peptide 2,012.2 (*)    All other components  within normal limits  BLOOD GAS, ARTERIAL - Abnormal; Notable for the following components:   pO2, Arterial 156 (*)    All other components within normal limits  LACTIC ACID, PLASMA - Abnormal; Notable for the following components:   Lactic Acid, Venous 3.1 (*)    All other components within normal limits  TROPONIN I (HIGH SENSITIVITY) - Abnormal; Notable for the following components:   Troponin I (High Sensitivity) 43 (*)    All other components within normal limits  RESP PANEL BY RT-PCR (FLU A&B, COVID) ARPGX2  MAGNESIUM  LACTIC ACID, PLASMA  PROCALCITONIN  HEMOGLOBIN A1C  TROPONIN I (HIGH SENSITIVITY)  TROPONIN I (HIGH SENSITIVITY)   ____________________________________________  12 Lead EKG  Sinus rhythm with a rate of 80 bpm.  Few PVCs.  Extreme axis deviation.  Right and left posterior fascicular blocks.  No STEMI.  QTc 508 ms ____________________________________________  RADIOLOGY  ED MD interpretation: 1 view CXR reviewed by me with pulmonary vascular congestion  Official radiology report(s): DG Chest Portable 1 View  Result Date: 05/13/2021 CLINICAL DATA:  Respiratory distress. EXAM: PORTABLE CHEST 1 VIEW COMPARISON:  Chest x-ray 01/2021 FINDINGS: The heart is mildly enlarged but stable. Stable surgical changes cardiac  surgery. Mild tortuosity and calcification of the thoracic aorta is again noted. Vascular congestion and interstitial edema suggesting CHF. No definite pleural effusions. No focal infiltrates. IMPRESSION: CHF. Electronically Signed   By: Rudie Meyer M.D.   On: 05/13/2021 08:15    ____________________________________________   PROCEDURES and INTERVENTIONS  Procedure(s) performed (including Critical Care):  .1-3 Lead EKG Interpretation Performed by: Delton Prairie, MD Authorized by: Delton Prairie, MD     Interpretation: normal     ECG rate:  82   ECG rate assessment: normal     Rhythm: sinus rhythm     Ectopy: PVCs     Conduction: normal   .Critical Care Performed by: Delton Prairie, MD Authorized by: Delton Prairie, MD   Critical care provider statement:    Critical care time (minutes):  45   Critical care was necessary to treat or prevent imminent or life-threatening deterioration of the following conditions:  Respiratory failure   Critical care was time spent personally by me on the following activities:  Discussions with consultants, evaluation of patient's response to treatment, examination of patient, ordering and performing treatments and interventions, ordering and review of laboratory studies, ordering and review of radiographic studies, pulse oximetry, re-evaluation of patient's condition, obtaining history from patient or surrogate and review of old charts  Medications  albuterol (VENTOLIN HFA) 108 (90 Base) MCG/ACT inhaler 2 puff (has no administration in time range)  dextromethorphan-guaiFENesin (MUCINEX DM) 30-600 MG per 12 hr tablet 1 tablet (has no administration in time range)  hydrALAZINE (APRESOLINE) injection 5 mg (has no administration in time range)  acetaminophen (TYLENOL) tablet 650 mg (has no administration in time range)  ipratropium-albuterol (DUONEB) 0.5-2.5 (3) MG/3ML nebulizer solution 3 mL (3 mLs Nebulization Given 05/13/21 0756)  LORazepam (ATIVAN) injection 1  mg (1 mg Intravenous Given 05/13/21 0756)  furosemide (LASIX) injection 60 mg (60 mg Intravenous Given 05/13/21 0910)    ____________________________________________   MDM / ED COURSE   60 year old male with reduced ejection fraction presents to the ED short of breath and in respiratory distress, with evidence of volume overload and CHF exacerbation, requiring BiPAP and medical admission.  Reportedly hypoxic in the field to the low 80s, otherwise hemodynamically stable.  Work-up with evidence of volume overload.  No evidence  of pneumonia, PTX or sepsis.  On his first lactic acid is elevated, his second 1 clears with BiPAP and no fluid resuscitation.  Sincerely doubt sepsis or infection considering his low procalcitonin.  He denies any chest pain and his first troponin is moderately elevated, we will continue to trend these.  We will discuss with medicine for admission  Clinical Course as of 05/13/21 1038  Fri May 13, 2021  2637 Looks improved.  Respiratory rate is slowing on the BiPAP.  Reports that he was supposed to go get a stress test this morning.  But did not make it.  Denies chest pain or pressure.  Again tells me "just my breathing." [DS]    Clinical Course User Index [DS] Delton Prairie, MD    ____________________________________________   FINAL CLINICAL IMPRESSION(S) / ED DIAGNOSES  Final diagnoses:  Shortness of breath  Hypoxia  Acute on chronic systolic congestive heart failure [facility])     ED Discharge Orders     None        Brigham Cobbins   Note:  This document was prepared using Dragon voice recognition software and may include unintentional dictation errors.    Delton Prairie, MD 05/13/21 1040

## 2021-05-14 DIAGNOSIS — I480 Paroxysmal atrial fibrillation: Secondary | ICD-10-CM

## 2021-05-14 DIAGNOSIS — I10 Essential (primary) hypertension: Secondary | ICD-10-CM

## 2021-05-14 DIAGNOSIS — E785 Hyperlipidemia, unspecified: Secondary | ICD-10-CM

## 2021-05-14 LAB — CBC
HCT: 43.6 % (ref 39.0–52.0)
Hemoglobin: 15.2 g/dL (ref 13.0–17.0)
MCH: 31.1 pg (ref 26.0–34.0)
MCHC: 34.9 g/dL (ref 30.0–36.0)
MCV: 89.3 fL (ref 80.0–100.0)
Platelets: 249 10*3/uL (ref 150–400)
RBC: 4.88 MIL/uL (ref 4.22–5.81)
RDW: 15.7 % — ABNORMAL HIGH (ref 11.5–15.5)
WBC: 8.7 10*3/uL (ref 4.0–10.5)
nRBC: 0 % (ref 0.0–0.2)

## 2021-05-14 LAB — GLUCOSE, CAPILLARY
Glucose-Capillary: 195 mg/dL — ABNORMAL HIGH (ref 70–99)
Glucose-Capillary: 214 mg/dL — ABNORMAL HIGH (ref 70–99)
Glucose-Capillary: 199 mg/dL — ABNORMAL HIGH (ref 70–99)
Glucose-Capillary: 193 mg/dL — ABNORMAL HIGH (ref 70–99)

## 2021-05-14 LAB — LIPID PANEL
Cholesterol: 115 mg/dL (ref 0–200)
HDL: 43 mg/dL (ref >40)
LDL Cholesterol: 62 mg/dL (ref 0–99)
Total CHOL/HDL Ratio: 2.7 RATIO
Triglycerides: 52 mg/dL (ref <150)
VLDL: 10 mg/dL (ref 0–40)

## 2021-05-14 LAB — MAGNESIUM: Magnesium: 2.3 mg/dL (ref 1.7–2.4)

## 2021-05-14 LAB — BASIC METABOLIC PANEL
Anion gap: 6 (ref 5–15)
BUN: 21 mg/dL (ref 8–23)
CO2: 28 mmol/L (ref 22–32)
Calcium: 9.3 mg/dL (ref 8.9–10.3)
Chloride: 100 mmol/L (ref 98–111)
Creatinine, Ser: 0.99 mg/dL (ref 0.61–1.24)
GFR, Estimated: >60 mL/min (ref >60)
Glucose, Bld: 209 mg/dL — ABNORMAL HIGH (ref 70–99)
Potassium: 3.6 mmol/L (ref 3.5–5.1)
Sodium: 134 mmol/L — ABNORMAL LOW (ref 135–145)

## 2021-05-14 LAB — HEPARIN LEVEL (UNFRACTIONATED): Heparin Unfractionated: >1.1 IU/mL — ABNORMAL HIGH (ref 0.30–0.70)

## 2021-05-14 LAB — APTT
aPTT: 78 seconds — ABNORMAL HIGH (ref 24–36)
aPTT: 79 seconds — ABNORMAL HIGH (ref 24–36)

## 2021-05-14 LAB — TROPONIN I (HIGH SENSITIVITY)
Troponin I (High Sensitivity): 748 ng/L (ref <18)
Troponin I (High Sensitivity): 985 ng/L (ref <18)

## 2021-05-14 MED ORDER — DILTIAZEM HCL 25 MG/5ML IV SOLN
10.0000 mg | Freq: Once | INTRAVENOUS | Status: AC
  Administered 2021-05-14: 10 mg via INTRAVENOUS

## 2021-05-14 MED ORDER — OXYMETAZOLINE HCL 0.05 % NA SOLN
1.0000 | Freq: Two times a day (BID) | NASAL | Status: DC
  Administered 2021-05-14 – 2021-05-15 (×2): 1 via NASAL
  Filled 2021-05-14: qty 15

## 2021-05-14 MED ORDER — NITROGLYCERIN 0.4 MG/SPRAY TL SOLN
1.0000 | Status: DC | PRN
Start: 2021-05-14 — End: 2021-05-14
  Filled 2021-05-14: qty 4.9

## 2021-05-14 MED ORDER — METOPROLOL TARTRATE 25 MG PO TABS
25.0000 mg | ORAL_TABLET | Freq: Two times a day (BID) | ORAL | Status: DC
  Administered 2021-05-14 (×2): 25 mg via ORAL
  Filled 2021-05-14 (×2): qty 1

## 2021-05-14 MED ORDER — TRAZODONE HCL 50 MG PO TABS
25.0000 mg | ORAL_TABLET | Freq: Every evening | ORAL | Status: DC | PRN
  Administered 2021-05-14: 25 mg via ORAL
  Filled 2021-05-14: qty 1

## 2021-05-14 MED ORDER — MORPHINE SULFATE (PF) 2 MG/ML IV SOLN: 2.0000 mg | INTRAVENOUS | Status: DC | PRN

## 2021-05-14 MED ORDER — DIPHENHYDRAMINE HCL 25 MG PO CAPS
25.0000 mg | ORAL_CAPSULE | ORAL | Status: DC | PRN
  Administered 2021-05-14: 25 mg via ORAL
  Filled 2021-05-14: qty 1

## 2021-05-14 MED ORDER — NITROGLYCERIN 0.4 MG SL SUBL
0.4000 mg | SUBLINGUAL_TABLET | SUBLINGUAL | Status: DC | PRN
  Administered 2021-05-14 (×3): 0.4 mg via SUBLINGUAL
  Filled 2021-05-14: qty 1

## 2021-05-14 MED ORDER — TRAZODONE HCL 50 MG PO TABS: 25.0000 mg | ORAL_TABLET | Freq: Every evening | ORAL | Status: DC | PRN

## 2021-05-14 MED ORDER — POTASSIUM CHLORIDE CRYS ER 20 MEQ PO TBCR
40.0000 meq | EXTENDED_RELEASE_TABLET | ORAL | Status: AC
  Administered 2021-05-14 (×2): 40 meq via ORAL
  Filled 2021-05-14 (×2): qty 2

## 2021-05-14 MED ORDER — LORAZEPAM 0.5 MG PO TABS
0.5000 mg | ORAL_TABLET | ORAL | Status: DC | PRN
  Administered 2021-05-14: 0.5 mg via ORAL
  Filled 2021-05-14: qty 1

## 2021-05-14 MED ORDER — DILTIAZEM HCL 25 MG/5ML IV SOLN: 5.0000 mg | Freq: Once | INTRAVENOUS | Status: AC

## 2021-05-14 MED ORDER — MORPHINE SULFATE (PF) 2 MG/ML IV SOLN
INTRAVENOUS | Status: AC
  Administered 2021-05-14: 2 mg via INTRAVENOUS
  Filled 2021-05-14: qty 1

## 2021-05-14 MED ORDER — DIPHENHYDRAMINE HCL 25 MG PO CAPS: 25.0000 mg | ORAL_CAPSULE | Freq: Every evening | ORAL | Status: DC | PRN

## 2021-05-14 MED ORDER — DILTIAZEM HCL-DEXTROSE 125-5 MG/125ML-% IV SOLN (PREMIX)
5.0000 mg/h | INTRAVENOUS | Status: DC
  Administered 2021-05-14: 5 mg/h via INTRAVENOUS
  Filled 2021-05-14: qty 125

## 2021-05-14 MED ORDER — DILTIAZEM HCL 25 MG/5ML IV SOLN
INTRAVENOUS | Status: AC
  Administered 2021-05-14: 5 mg via INTRAVENOUS
  Filled 2021-05-14: qty 5

## 2021-05-14 NOTE — Consult Note (Signed)
ANTICOAGULATION CONSULT NOTE   Pharmacy Consult for heparin infusion Indication: chest pain/ACS  Allergies  Allergen Reactions   Spironolactone Other (See Comments)    HYPERKALEMIA    Patient Measurements: Height: 6\' 1"  (185.4 cm) Weight: 121.2 kg (267 lb 1.6 oz) IBW/kg (Calculated) : 79.9 Heparin Dosing Weight: 106.3 kg   Vital Signs: Temp: 97.5 F (36.4 C) (09/10 0144) Temp Source: Oral (09/10 0144) BP: 140/93 (09/10 0144) Pulse Rate: 42 (09/10 0144)  Labs: Recent Labs    05/13/21 0747 05/13/21 1006 05/13/21 1744 05/13/21 2053 05/14/21 0050  HGB 15.8  --   --   --   --   HCT 48.0  --   --   --   --   PLT 303  --   --   --   --   APTT  --   --  34  --  78*  HEPARINUNFRC  --   --  >1.10*  --   --   CREATININE 0.87  --   --   --   --   TROPONINIHS 43*   < > 1,552* 1,570* 748*   < > = values in this interval not displayed.     Estimated Creatinine Clearance: 120 mL/min (by C-G formula based on SCr of 0.87 mg/dL).   Medical History: Past Medical History:  Diagnosis Date   Anxiety    Chronic systolic heart failure (HCC)    a. 07/2013 Echo: Ejection fraction of 45-50% with possible inferior wall hypokinesis; b. 03/2018 Echo: EF 40-45%; c. 06/2020 Echo: EF 40-45%, glob HK. Mod LVH, gr1 DD. Nl PASP. Mild BAE. Triv MR. Mild AoV sclerosis. Mild Ao dil (56mm).   Coronary artery disease    Significant left main and Severe three-vessel coronary artery disease in December of 2014. He underwent CABG in January of 2015 with LIMA to LAD, sequential SVG to ramus/OM 2 and SVG to OM 3   Diabetes mellitus without complication (HCC)    Hyperlipidemia    Hypertension    Ischemic cardiomyopathy    a. 07/2013 Echo: EF 45-50%; b. 03/2018 Echo: EF 40-45%; c. 06/2020 Echo: EF 40-45%.   Medical non-compliance    MI (myocardial infarction) (HCC)    Obesity    PAD (peripheral artery disease) (HCC)    a. 04/2020 PTA of L PT.   PAF (paroxysmal atrial fibrillation) (HCC)    Tobacco abuse      Medications:  Apixaban 5 mg BID PTA-- last dose 05/12/21 evening   Assessment: 60 year old male with history of atrial fibrillation on apixaban 5 mg BID PTA presented with respiratory distress. Troponin 480 > 1300. Pharmacy has been consulted to transition to heparin therapy.  Baseline HL/ aPTT ordered   Goal of Therapy:  Heparin level 0.3-0.7 units/ml aPTT 66-102 seconds Monitor platelets by anticoagulation protocol: Yes  9/10 0050 aPTT 78, therapeutic x 1   Plan:  Continue heparin infusion at 1500 units/hr Recheck aPTT in 6 hrs with daily HL Continue to monitor H&H and platelets  66, PharmD, [facility] 05/14/2021 2:01 AM

## 2021-05-14 NOTE — Progress Notes (Signed)
PROGRESS NOTE    Marc [name]  ZCH:[id number] DOB: Jul 16, 1961 DOA: 05/13/2021 PCP: [facility]   Brief Narrative: Taken from H&P. Marc [name] is a 60 y.o. male with medical history significant of  hypertension, hyperlipidemia, diabetes mellitus, tobacco abuse, CAD, CABG, sCHF with EF of 35-40%, ischemic colitis, aortic aneurysm with dissection, atrial fibrillation on Eliquis, CKD stage IIIa, anxiety, left foot ray amputation, right toe amputation, who presents with worsening dyspnea.  Denies any chest pain. He was found to be in respiratory distress and desaturating at 83% on room air, initially requiring BiPAP.  Now stable at room air.  Patient does not use any oxygen at home. Also found to have elevated BNP at 2012, troponin elevated and peaked at 1570, not trending down. Chest x-ray concerning for vascular congestion and interstitial pulmonary edema.  Patient had a recent cardiac catheterization which shows extensive triple-vessel disease and medical management was recommended by cardiology at that time.  Cardiology was again consulted and he was started on heparin infusion, plan is to continue till tomorrow and optimize medical management before discharge.  No other cardiac work-up at this time.  Subjective: Patient denies any chest pain or dyspnea while resting.  He wants to go home.  Stating that he would like to stick with his own cardiologist.  Assessment & Plan:   Principal Problem:   Acute on chronic systolic CHF (congestive heart failure) (HCC) Active Problems:   Hypertension   Hyperlipidemia   CAD (coronary artery disease)   Atrial fibrillation (HCC)   Type II diabetes mellitus with renal manifestations (HCC)   CKD (chronic kidney disease), stage IIIa   Anxiety   Hypokalemia   NSTEMI (non-ST elevated myocardial infarction) (HCC)   Acute respiratory failure with hypoxia (HCC)   Elevated lactic acid level   Leukocytosis  Acute respiratory failure  with hypoxia due to acute on chronic systolic CHF (congestive heart failure) (HCC): 2D echo on 03/01/2021 showed EF of 35-40%. Acute hypoxic respiratory failure resolved and now patient is saturating well on room air. -Continue with IV Lasix for another day -Strict intake and output -Daily BMP and weight  Elevated troponin/NSTEMI/history of CAD.  S/p CABG.  Recent cardiac catheterization with extensive triple-vessel disease and medical management was recommended. Troponin peaked at 1570 and now trending down. -Continue heparin infusion for another day-can resume home Eliquis -Continue aspirin and Lipitor  Paroxysmal atrial fibrillation.  Currently in A. fib but rate well controlled. -Continue with Tikosyn -Will resume home dose of Eliquis from tomorrow after stopping heparin infusion.  Hypertension -IV hydralazine as needed -Patient is on IV Lasix   Hyperlipidemia -Lipitor  Type II diabetes mellitus with renal manifestations (HCC): Some improvement in A1c to 6.9 from 7.8. -Continue with current dose of Lantus -Continue with SSI  CKD (chronic kidney disease), stage IIIa: Stable -Follow-up with BMP   Anxiety and depression: -Continue home medications   Hypokalemia: Potassium 3.6, magnesium 2.2 -Repleted potassium 1 more time to keep the levels above 4 as recommended by cardiology.   Elevated lactic acid level: Has been normalized now. Lactic acid 3.1, no fever, no source of infection identified, likely due to hypoxia.   Leukocytosis: Resolved  Mild, WBC 11.6, no source of infection, likely reactive.  Objective: Vitals:   05/14/21 0624 05/14/21 0738 05/14/21 1024 05/14/21 1120  BP: 121/72 120/84 119/83 114/77  Pulse: 96 71  68  Resp: 20 17  17   Temp: 97.7 F (36.5 C) 97.8 F (36.6 C)  97.6 F (36.4 C)  TempSrc: Oral     SpO2: 95% 96%  98%  Weight: 120.4 kg     Height: 6\' 1"  (1.854 m)       Intake/Output Summary (Last 24 hours) at 05/14/2021 1255 Last data filed at  05/14/2021 1120 Gross per 24 hour  Intake 531.66 ml  Output 2200 ml  Net -1668.34 ml   Filed Weights   05/13/21 1747 05/14/21 0624  Weight: 121.2 kg 120.4 kg    Examination:  General exam: Appears calm and comfortable  Respiratory system: Clear to auscultation. Respiratory effort normal. Cardiovascular system: Irregularly irregular. Gastrointestinal system: Soft, nontender, nondistended, bowel sounds positive. Central nervous system: Alert and oriented. No focal neurological deficits.Symmetric 5 x 5 power. Extremities: Trace LE edema, no cyanosis, pulses intact and symmetrical. Psychiatry: Judgement and insight appear normal.     DVT prophylaxis: Heparin Code Status: Full Family Communication: Discussed with patient Disposition Plan:  Status is: Inpatient  Remains inpatient appropriate because:Inpatient level of care appropriate due to severity of illness  Dispo: The patient is from: Home              Anticipated d/c is to: Home              Patient currently is not medically stable to d/c.   Difficult to place patient No               Level of care: Progressive Cardiac  All the records are reviewed and case discussed with Care Management/Social Worker. Management plans discussed with the patient, nursing and they are in agreement.  Consultants:  Cardiology  Procedures:  Antimicrobials:   Data Reviewed: I have personally reviewed following labs and imaging studies  CBC: Recent Labs  Lab 05/13/21 0747 05/14/21 0543  WBC 11.6* 8.7  NEUTROABS 8.8*  --   HGB 15.8 15.2  HCT 48.0 43.6  MCV 91.8 89.3  PLT 303 249   Basic Metabolic Panel: Recent Labs  Lab 05/13/21 0747 05/14/21 0543  NA 135 134*  K 3.3* 3.6  CL 99 100  CO2 26 28  GLUCOSE 170* 209*  BUN 13 21  CREATININE 0.87 0.99  CALCIUM 9.7 9.3  MG 2.2 2.3   GFR: Estimated Creatinine Clearance: 105.2 mL/min (by C-G formula based on SCr of 0.99 mg/dL). Liver Function Tests: Recent Labs  Lab  05/13/21 0747  AST 20  ALT 19  ALKPHOS 106  BILITOT 1.1  PROT 7.4  ALBUMIN 4.1   No results for input(s): LIPASE, AMYLASE in the last 168 hours. No results for input(s): AMMONIA in the last 168 hours. Coagulation Profile: No results for input(s): INR, PROTIME in the last 168 hours. Cardiac Enzymes: No results for input(s): CKTOTAL, CKMB, CKMBINDEX, TROPONINI in the last 168 hours. BNP (last 3 results) No results for input(s): PROBNP in the last 8760 hours. HbA1C: Recent Labs    05/13/21 1444  HGBA1C 6.9*   CBG: Recent Labs  Lab 05/13/21 1838 05/13/21 2138 05/14/21 0816 05/14/21 1140  GLUCAP 362* 331* 195* 214*   Lipid Profile: Recent Labs    05/14/21 0543  CHOL 115  HDL 43  LDLCALC 62  TRIG 52  CHOLHDL 2.7   Thyroid Function Tests: No results for input(s): TSH, T4TOTAL, FREET4, T3FREE, THYROIDAB in the last 72 hours. Anemia Panel: No results for input(s): VITAMINB12, FOLATE, FERRITIN, TIBC, IRON, RETICCTPCT in the last 72 hours. Sepsis Labs: Recent Labs  Lab 05/13/21 0740 05/13/21 0747 05/13/21 1006 05/13/21 1445  05/13/21 1744  PROCALCITON  --  <0.10  --   --   --   LATICACIDVEN 3.1*  --  1.3 2.0* 1.8    Recent Results (from the past 240 hour(s))  Resp Panel by RT-PCR (Flu A&B, Covid) Nasopharyngeal Swab     Status: None   Collection Time: 05/13/21  7:47 AM   Specimen: Nasopharyngeal Swab; Nasopharyngeal(NP) swabs in vial transport medium  Result Value Ref Range Status   SARS Coronavirus 2 by RT PCR NEGATIVE NEGATIVE Final    Comment: (NOTE) SARS-CoV-2 target nucleic acids are NOT DETECTED.  The SARS-CoV-2 RNA is generally detectable in upper respiratory specimens during the acute phase of infection. The lowest concentration of SARS-CoV-2 viral copies this assay can detect is 138 copies/mL. A negative result does not preclude SARS-Cov-2 infection and should not be used as the sole basis for treatment or other patient management decisions. A  negative result may occur with  improper specimen collection/handling, submission of specimen other than nasopharyngeal swab, presence of viral mutation(s) within the areas targeted by this assay, and inadequate number of viral copies(<138 copies/mL). A negative result must be combined with clinical observations, patient history, and epidemiological information. The expected result is Negative.  Fact Sheet for Patients:  BloggerCourse.com  Fact Sheet for Healthcare Providers:  SeriousBroker.it  This test is no t yet approved or cleared by the [place] FDA and  has been authorized for detection and/or diagnosis of SARS-CoV-2 by FDA under an Emergency Use Authorization (EUA). This EUA will remain  in effect (meaning this test can be used) for the duration of the COVID-19 declaration under Section 564(b)(1) of the Act, 21 U.S.C.section 360bbb-3(b)(1), unless the authorization is terminated  or revoked sooner.       Influenza A by PCR NEGATIVE NEGATIVE Final   Influenza B by PCR NEGATIVE NEGATIVE Final    Comment: (NOTE) The Xpert Xpress SARS-CoV-2/FLU/RSV plus assay is intended as an aid in the diagnosis of influenza from Nasopharyngeal swab specimens and should not be used as a sole basis for treatment. Nasal washings and aspirates are unacceptable for Xpert Xpress SARS-CoV-2/FLU/RSV testing.  Fact Sheet for Patients: BloggerCourse.com  Fact Sheet for Healthcare Providers: SeriousBroker.it  This test is not yet approved or cleared by the [place] FDA and has been authorized for detection and/or diagnosis of SARS-CoV-2 by FDA under an Emergency Use Authorization (EUA). This EUA will remain in effect (meaning this test can be used) for the duration of the COVID-19 declaration under Section 564(b)(1) of the Act, 21 U.S.C. section 360bbb-3(b)(1), unless the authorization  is terminated or revoked.  Performed at [facility], [address] [place], [place] [address]      Radiology Studies: DG Chest Portable 1 View  Result Date: 05/13/2021 CLINICAL DATA:  Respiratory distress. EXAM: PORTABLE CHEST 1 VIEW COMPARISON:  Chest x-ray 01/2021 FINDINGS: The heart is mildly enlarged but stable. Stable surgical changes cardiac surgery. Mild tortuosity and calcification of the thoracic aorta is again noted. Vascular congestion and interstitial edema suggesting CHF. No definite pleural effusions. No focal infiltrates. IMPRESSION: CHF. Electronically Signed   By: Rudie Meyer M.D.   On: 05/13/2021 08:15    Scheduled Meds:  aspirin EC  81 mg Oral Daily   atorvastatin  80 mg Oral QPM   dofetilide  500 mcg Oral BID   furosemide  40 mg Intravenous Q12H   insulin aspart  0-5 Units Subcutaneous QHS   insulin aspart  0-9 Units Subcutaneous  TID WC   insulin glargine-yfgn  30 Units Subcutaneous Daily   metoprolol tartrate  25 mg Oral BID   oxymetazoline  1 spray Each Nare BID   potassium chloride  40 mEq Oral Q4H   sacubitril-valsartan  1 tablet Oral BID   Continuous Infusions:  heparin 1,500 Units/hr (05/14/21 0900)     LOS: 1 day   Time spent: 45 minutes. More than 50% of the time was spent in counseling/coordination of care  Arnetha Courser, MD [facility]  If 7PM-7AM, please contact night-coverage [email or web address]  05/14/2021, 12:55 PM   This record has been created using Conservation officer, historic buildings. Errors have been sought and corrected,but may not always be located. Such creation errors do not reflect on the standard of care.

## 2021-05-14 NOTE — Progress Notes (Signed)
Progress Note  Patient Name: Marc [name] Date of Encounter: 05/14/2021  Primary Cardiologist: Agbor-Etang  Subjective   No angina. Dyspnea improving. Documented UOP 1.3 L for the admission. Weight trend 121.2-->120.4 kg. Renal function slightly trending up, though remains normal. He developed Afib with RVR with ventricular rates in the low 100s to 120s bpm around 2226 last evening and was placed on a Cardizem gtt with improvement in ventricular rates. He remains in Afib this morning. Tikosyn held this morning with QT> 500. Potassium 3.6 this morning with repletion ordered. Magnesium at goal. HS-Tn peaked at 1570, and is now down trending.   He indicates he wants to continue to follow with [facility].  Inpatient Medications    Scheduled Meds:  aspirin EC  81 mg Oral Daily   atorvastatin  80 mg Oral QPM   dofetilide  500 mcg Oral BID   escitalopram  5 mg Oral QHS   furosemide  40 mg Intravenous Q12H   insulin aspart  0-5 Units Subcutaneous QHS   insulin aspart  0-9 Units Subcutaneous TID WC   insulin glargine-yfgn  30 Units Subcutaneous Daily   oxymetazoline  1 spray Each Nare BID   potassium chloride  40 mEq Oral Q4H   sacubitril-valsartan  1 tablet Oral BID   Continuous Infusions:  diltiazem (CARDIZEM) infusion 5 mg/hr (05/14/21 0137)   heparin 1,500 Units/hr (05/13/21 1827)   PRN Meds: acetaminophen, albuterol, calcium carbonate, dextromethorphan-guaiFENesin, diazepam, diphenhydrAMINE, hydrALAZINE, morphine injection, naphazoline-pheniramine, nitroGLYCERIN, traZODone   Vital Signs    Vitals:   05/14/21 0215 05/14/21 0223 05/14/21 0624 05/14/21 0738  BP: 105/82 130/82 121/72 120/84  Pulse: (!) 115 (!) 108 96 71  Resp: 20 20 20 17   Temp:  97.8 F (36.6 C) 97.7 F (36.5 C) 97.8 F (36.6 C)  TempSrc:  Oral Oral   SpO2: 94% 96% 95% 96%  Weight:   120.4 kg   Height:   6\' 1"  (1.854 m)     Intake/Output Summary (Last 24 hours) at 05/14/2021 0802 Last data  filed at 05/14/2021 0736 Gross per 24 hour  Intake 231.1 ml  Output 1600 ml  Net -1368.9 ml   Filed Weights   05/13/21 1747 05/14/21 0624  Weight: 121.2 kg 120.4 kg    Telemetry    Developed Afib with RVR around 2226 on 9/9 with ventricular rates in the low 100s to 120s bpm. Remains in Afib with controlled ventricular response this morning - Personally Reviewed  ECG    Afib with RVR, 117 bpm, RBBB, QTc 558, nonspecific st/t changes - Personally Reviewed  Physical Exam   GEN: No acute distress.   Neck: JVD mildly elevated. Cardiac: IRIR, no murmurs, rubs, or gallops.  Respiratory: Faint crackles along the bases bilaterally.  GI: Soft, nontender, non-distended.   MS: Trivial pretibial edema bilaterally; No deformity. Neuro:  Alert and oriented x 3; Nonfocal.  Psych: Normal affect.  Labs    Chemistry Recent Labs  Lab 05/13/21 0747 05/14/21 0543  NA 135 134*  K 3.3* 3.6  CL 99 100  CO2 26 28  GLUCOSE 170* 209*  BUN 13 21  CREATININE 0.87 0.99  CALCIUM 9.7 9.3  PROT 7.4  --   ALBUMIN 4.1  --   AST 20  --   ALT 19  --   ALKPHOS 106  --   BILITOT 1.1  --   GFRNONAA >60 >60  ANIONGAP 10 6     Hematology Recent Labs  Lab 05/13/21 0747 05/14/21 0543  WBC 11.6* 8.7  RBC 5.23 4.88  HGB 15.8 15.2  HCT 48.0 43.6  MCV 91.8 89.3  MCH 30.2 31.1  MCHC 32.9 34.9  RDW 15.9* 15.7*  PLT 303 249    Cardiac EnzymesNo results for input(s): TROPONINI in the last 168 hours. No results for input(s): TROPIPOC in the last 168 hours.   BNP Recent Labs  Lab 05/13/21 0747  BNP 2,012.2*     DDimer No results for input(s): DDIMER in the last 168 hours.   Radiology    DG Chest Portable 1 View  Result Date: 05/13/2021 IMPRESSION: CHF. Electronically Signed   By: Rudie Meyer M.D.   On: 05/13/2021 08:15    Cardiac Studies   R/LHC 03/2021: Ost RCA to Prox RCA lesion is 100% stenosed. Mid LM to Dist LM lesion is 90% stenosed. Ost LAD to Prox LAD lesion is 100%  stenosed. Ost Cx to Prox Cx lesion is 50% stenosed. Mid Cx to Dist Cx lesion is 60% stenosed. 3rd Mrg lesion is 90% stenosed. 1st LPL lesion is 90% stenosed. 2nd LPL lesion is 90% stenosed. SVG graft was visualized by angiography. Prox Graft to Mid Graft lesion is 30% stenosed. Origin to Prox Graft lesion is 100% stenosed. LIMA graft was visualized by angiography and is normal in caliber. The graft exhibits no disease. Dist LAD lesion is 60% stenosed. Mid LAD lesion is 70% stenosed.   1.  Severe underlying left main and three-vessel coronary artery disease with patent LIMA to LAD and patent SVG to OM2/ramus.  Occluded SVG to OM 3.  The RCA is known to be small and nondominant and occluded proximally with some left-to-right collaterals. 2.  Left ventricular angiography was not performed.  EF was moderately reduced by echo. 3.  Right heart catheterization showed mildly elevated wedge pressure at 20 mmHg with a prominent V wave, mild pulmonary hypertension and moderately reduced cardiac output.   Recommendations: Recommend continuing medical therapy for coronary artery disease.  No good options to revascularize the OM 3 and LPL distributions given the need to stent the left main, mid left circumflex and 3 branches of the left circumflex.  Risks probably outweigh the benefit. The best option is to treat medically including optimizing heart failure and rhythm control. The patient is still volume overloaded.  Continue IV diuresis for another day and likely switch to oral diuretic tomorrow. Resume heparin drip in 6 hours and can switch to Eliquis tomorrow morning if no bleeding issues from the cath site. __________   2D echo 02/2021: 1. Left ventricular ejection fraction, by estimation, is 35 to 40%. The  left ventricle has moderately decreased function. Left ventricular  endocardial border not optimally defined to evaluate regional wall motion.  There is moderate asymmetric left  ventricular  hypertrophy of the posterior segment. Left ventricular  diastolic function could not be evaluated.   2. Right ventricular systolic function was not well visualized. The right  ventricular size is not well visualized. Mildly increased right  ventricular wall thickness.   3. The mitral valve is degenerative. Unable to accurately assess mitral  valve regurgitation.   4. The aortic valve is tricuspid. There is mild calcification of the  aortic valve. There is mild thickening of the aortic valve. Aortic valve  regurgitation not well assessed. Mild to moderate aortic valve  sclerosis/calcification is present, without any  evidence of aortic stenosis.  __________   2D echo 06/2020: 1. Left ventricular ejection fraction,  by estimation, is 40 to 45%. The  left ventricle has mildly decreased function. The left ventricle  demonstrates global hypokinesis. The left ventricular internal cavity size  was moderately dilated. There is moderate   left ventricular hypertrophy. Left ventricular diastolic parameters are  consistent with Grade I diastolic dysfunction (impaired relaxation).   2. Right ventricular systolic function is moderately reduced. The right  ventricular size is normal. Mildly increased right ventricular wall  thickness. There is normal pulmonary artery systolic pressure.   3. Left atrial size was mildly dilated.   4. Right atrial size was mildly dilated.   5. The mitral valve is grossly normal. Trivial mitral valve  regurgitation.   6. The aortic valve is tricuspid. There is mild thickening of the aortic  valve. Aortic valve regurgitation is not visualized. Mild aortic valve  sclerosis is present, with no evidence of aortic valve stenosis.   7. Aortic dilatation noted. There is mild dilatation of the transverse  aorta, measuring 34 mm.   8. The inferior vena cava is normal in size with greater than 50%  respiratory variability, suggesting right atrial pressure of 3  mmHg. __________   2D echo 03/2018: - Left ventricle: The cavity size was mildly dilated. There was    mild concentric hypertrophy. Systolic function was mildly to    moderately reduced. The estimated ejection fraction was in the    range of 40% to 45%. Probable hypokinesis of the    mid-apicalanteroseptal, anterior, and apical myocardium. Doppler    parameters are consistent with abnormal left ventricular    relaxation (grade 1 diastolic dysfunction).  - Left atrium: The atrium was mildly dilated.  - Right atrium: The atrium was mildly dilated. __________   [facility] 08/2013: Distal left main 60%, mid LAD 80%, D3 30%, OM1 90%, left AV groove 40%, LPL1 90%, LPL2 90%, RCA 100%. EF 35%. Recommendation: CABG.   Patient Profile     60 y.o. 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 Clyde Lundborg.  Assessment & Plan    1. CAD involving the native coronary arteries and bypass grafts with NSTEMI: -No chest pain -High-sensitivity troponin peaked at 1570 -Heparin gtt for 48 hours -Recent LHC in 03/2021 demonstrated severe underlying multivessel CAD as outlined above with recommendation to continue medical therapy given that there were no good targets for revascularization and risk was felt to outweigh benefit -Continue ASA with known severe multivessel CAD -Aggressive risk factor modification and secondary prevention recommended   2.  Acute on chronic combined systolic and diastolic CHF/ICM: -Appears to be in the setting of excessive water intake -Agree with IV  diuresis -PTA diuretic may need to be escalated at discharge, this will be deferred to his primary cardiologist -Carvedilol was discontinued by [facility] in 04/2021 with symptomatic bradycardia -Rechallenge with metoprolol as below  -PTA Farxiga and Entresto -No longer on BiDil secondary to relative hypotension   3.  Persistent A. fib: -He developed Afib with RVR in the evening of 9/9 and remains in Afib with controlled ventricular response on Cardizem gtt  a 5 mg/hr -Tikosyn held this morning with QTc > 500, conduction delay contributing  -Hold Lexapro with QT prolongation  -With his underlying cardiomyopathy, ongoing CCB use is not ideal -Beta blocker was held following admission to [facility] in 04/2021 secondary to symptomatic bradycardia -Given Afib and in the context of his cardiomyopathy, we will rechallenge him with a beta blocker, though lower dose -Stop Cardizem gtt -Start Lopressor 25 mg bid with recommendation to consolidate to Toprol XL or transition to Coreg prior to discharge given his cardiomyopathy  -Replete potassium as below -CHA2DS2-VASc at least 4 (CHF, HTN, DM, vascular disease) -Heparin gtt in place of Eliquis for now given NSTEMI, resume Eliquis prior to discharge  -Hydroxyzine discontinued by [facility] in 04/2021, as this was felt to be contributing to QT prolongation with noted improvement in QT following discontinuation of medication   4.  HTN: -Blood pressure well controlled currently -Medical therapy as above   5.  HLD: -LDL 40 -PTA atorvastatin   6. Hypokalemia: -Improving -Replete to goal 4.0   For questions or updates, please contact [facility] Please consult [email or web address] for contact info under Cardiology/STEMI.    Signed, Eula Listen, PA-C [facility] Pager: [phone] 05/14/2021, 8:02 AM

## 2021-05-14 NOTE — Consult Note (Signed)
ANTICOAGULATION CONSULT NOTE   Pharmacy Consult for heparin infusion Indication: chest pain/ACS  Allergies  Allergen Reactions   Spironolactone Other (See Comments)    HYPERKALEMIA    Patient Measurements: Height: 6\' 1"  (185.4 cm) Weight: 120.4 kg (265 lb 6.4 oz) IBW/kg (Calculated) : 79.9 Heparin Dosing Weight: 106.3 kg   Vital Signs: Temp: 97.8 F (36.6 C) (09/10 0738) Temp Source: Oral (09/10 0624) BP: 120/84 (09/10 0738) Pulse Rate: 71 (09/10 0738)  Labs: Recent Labs    05/13/21 0747 05/13/21 1006 05/13/21 1744 05/13/21 2053 05/14/21 0050 05/14/21 0218 05/14/21 0543  HGB 15.8  --   --   --   --   --  15.2  HCT 48.0  --   --   --   --   --  43.6  PLT 303  --   --   --   --   --  249  APTT  --   --  34  --  78*  --  79*  HEPARINUNFRC  --   --  >1.10*  --   --   --  >1.10*  CREATININE 0.87  --   --   --   --   --  0.99  TROPONINIHS 43*   < > 1,552* 1,570* 748* 985*  --    < > = values in this interval not displayed.     Estimated Creatinine Clearance: 105.2 mL/min (by C-G formula based on SCr of 0.99 mg/dL).   Medical History: Past Medical History:  Diagnosis Date   Anxiety    Chronic systolic heart failure (HCC)    a. 07/2013 Echo: Ejection fraction of 45-50% with possible inferior wall hypokinesis; b. 03/2018 Echo: EF 40-45%; c. 06/2020 Echo: EF 40-45%, glob HK. Mod LVH, gr1 DD. Nl PASP. Mild BAE. Triv MR. Mild AoV sclerosis. Mild Ao dil (48mm).   Coronary artery disease    Significant left main and Severe three-vessel coronary artery disease in December of 2014. He underwent CABG in January of 2015 with LIMA to LAD, sequential SVG to ramus/OM 2 and SVG to OM 3   Diabetes mellitus without complication (HCC)    Hyperlipidemia    Hypertension    Ischemic cardiomyopathy    a. 07/2013 Echo: EF 45-50%; b. 03/2018 Echo: EF 40-45%; c. 06/2020 Echo: EF 40-45%.   Medical non-compliance    MI (myocardial infarction) (HCC)    Obesity    PAD (peripheral artery  disease) (HCC)    a. 04/2020 PTA of L PT.   PAF (paroxysmal atrial fibrillation) (HCC)    Tobacco abuse     Medications:  Apixaban 5 mg BID PTA-- last dose 05/12/21 evening   Assessment: 60 year old male with history of atrial fibrillation on apixaban 5 mg BID PTA presented with respiratory distress. Troponin 480 > 1300. Pharmacy has been consulted to transition to heparin therapy.  Baseline HL/ aPTT ordered   9/10 0050 aPTT 78, therapeutic x 1 9/10 0543 aPTT 79, therapeutic x 2  (HL >1.10, not correlating)  Goal of Therapy:  Heparin level 0.3-0.7 units/ml aPTT 66-102 seconds Monitor platelets by anticoagulation protocol: Yes     Plan:  9/10 0543 aPTT 79, therapeutic x 2  (HL >1.10, not correlating) Continue heparin infusion at 1500 units/hr F/u  with daily aPTT/HL. Switch to HL when level correlating Continue to monitor H&H and platelets  66 PharmD Clinical Pharmacist 05/14/2021

## 2021-05-14 NOTE — Progress Notes (Signed)
Per order. Hold tikosyn if QTC is greater than 500. QTC this am is 527. Made both Dr. Nelson Chimes and Dr Mariah Milling aware. Will continue to monitor

## 2021-05-14 NOTE — Progress Notes (Signed)
Pt Qtc 479 at the time of his HS medication administration; 2000 dose of Tikosyn administered accordingly. Qtc now noted to be >500, ranging from 510-545. On call provider notified, will continue to monitor pt

## 2021-05-15 LAB — APTT: aPTT: 67 seconds — ABNORMAL HIGH (ref 24–36)

## 2021-05-15 LAB — CBC
HCT: 43.2 % (ref 39.0–52.0)
Hemoglobin: 14.8 g/dL (ref 13.0–17.0)
MCH: 31.3 pg (ref 26.0–34.0)
MCHC: 34.3 g/dL (ref 30.0–36.0)
MCV: 91.3 fL (ref 80.0–100.0)
Platelets: 273 10*3/uL (ref 150–400)
RBC: 4.73 MIL/uL (ref 4.22–5.81)
RDW: 15.8 % — ABNORMAL HIGH (ref 11.5–15.5)
WBC: 12.2 10*3/uL — ABNORMAL HIGH (ref 4.0–10.5)
nRBC: 0 % (ref 0.0–0.2)

## 2021-05-15 LAB — BASIC METABOLIC PANEL WITH GFR
Anion gap: 8 (ref 5–15)
BUN: 21 mg/dL (ref 8–23)
CO2: 28 mmol/L (ref 22–32)
Calcium: 9.4 mg/dL (ref 8.9–10.3)
Chloride: 101 mmol/L (ref 98–111)
Creatinine, Ser: 0.86 mg/dL (ref 0.61–1.24)
GFR, Estimated: >60 mL/min (ref >60)
Glucose, Bld: 143 mg/dL — ABNORMAL HIGH (ref 70–99)
Potassium: 3.5 mmol/L (ref 3.5–5.1)
Sodium: 137 mmol/L (ref 135–145)

## 2021-05-15 LAB — MAGNESIUM: Magnesium: 2.1 mg/dL (ref 1.7–2.4)

## 2021-05-15 LAB — GLUCOSE, CAPILLARY: Glucose-Capillary: 151 mg/dL — ABNORMAL HIGH (ref 70–99)

## 2021-05-15 LAB — POTASSIUM: Potassium: 3.5 mmol/L (ref 3.5–5.1)

## 2021-05-15 LAB — HEPARIN LEVEL (UNFRACTIONATED): Heparin Unfractionated: 0.66 IU/mL (ref 0.30–0.70)

## 2021-05-15 MED ORDER — NITROGLYCERIN 0.4 MG SL SUBL: 0.4000 mg | SUBLINGUAL_TABLET | SUBLINGUAL | 12 refills | Status: AC | PRN

## 2021-05-15 MED ORDER — FUROSEMIDE 40 MG PO TABS: 40.0000 mg | ORAL_TABLET | Freq: Two times a day (BID) | ORAL | Status: DC

## 2021-05-15 MED ORDER — APIXABAN 5 MG PO TABS
5.0000 mg | ORAL_TABLET | Freq: Two times a day (BID) | ORAL | Status: DC
  Administered 2021-05-15: 5 mg via ORAL
  Filled 2021-05-15: qty 1

## 2021-05-15 MED ORDER — POTASSIUM CHLORIDE CRYS ER 20 MEQ PO TBCR
40.0000 meq | EXTENDED_RELEASE_TABLET | ORAL | Status: AC
  Administered 2021-05-15 (×2): 40 meq via ORAL
  Filled 2021-05-15 (×2): qty 2

## 2021-05-15 MED ORDER — METOPROLOL SUCCINATE ER 50 MG PO TB24: 50.0000 mg | ORAL_TABLET | Freq: Every day | ORAL | 1 refills | Status: DC

## 2021-05-15 MED ORDER — METOPROLOL SUCCINATE ER 50 MG PO TB24
50.0000 mg | ORAL_TABLET | Freq: Every day | ORAL | Status: DC
  Administered 2021-05-15: 50 mg via ORAL
  Filled 2021-05-15: qty 1

## 2021-05-15 NOTE — Progress Notes (Addendum)
Patient seen and examined, note reviewed with the signed Advanced Practice Provider. I personally reviewed laboratory data, imaging studies and relevant notes. I independently examined the patient and formulated the important aspects of the plan. I have personally discussed the plan with the patient and/or family. Comments or changes to the note/plan are indicated below.  Feeling back to baseline. Breathing, edema improved. Gave HF education, especially fluid management.   Exam notable for no JVD sitting upright. Lungs clear in upper fields, only very faint crackles at the bases. No significant LE edema, improved from yesterday  Afib, paroxysmal RBBB, chronic Elevated troponin -continue dofetilide, as this has done well to manage his afib -Qtc unchanged, lengthened chronically due to RBBB -tolerating beta blocker, continue at discharge -holding lexapro given risk of prolonged QT. Defer to medicine team re: long term options/substitutions -change back to home apixaban   CAD s/p 5V CABG Acute on chronic combined systolic and diastolic heart failure Hypertension Hyperlipidemia Type II diabetes, on long term insulin PAD s/p prior amputations -no chest pain -restart oral furosemide 40 gm daily and dapaglifloxin 10 mg daily at discharge -continue entresto, aspirin, atorvastatin  OK for discharge today. We will arrange for outpatient follow up.  [facility] will sign off.   Medication Recommendations:   Start: Metoprolol succinate 50 mg daily  Continue from prior home meds: Apixaban 5 mg BID Aspirin 81 mg daily Atorvastatin 80 mg daily Dapagliflozin 10 mg daily-->restart at discharge Dofetilide 500 mg BID Furosemide 40 mg daily (may need to be changed to torsemide at follow up) Sacubitril-valsartan 49-51 mg BID  Change from prior home meds: STOP isosorbide-hydralazine due to hypotension   Jodelle Red, MD, PhD, [facility]  [facility]  [place]  [facility] at [facility] at [facility] [address], [address] [place], [place] [address] [phone]            Progress Note  Patient Name: Marc [name] Date of Encounter: 05/15/2021  Primary Cardiologist: Agbor-Etang  Subjective   No angina. Dyspnea improved, and back to baseline. Documented UOP 2.7 L for the past 24 hours, net - 4.1 L for the admission. Weight trend 120.4-->119.1 kg. Wants to go home.   Inpatient Medications    Scheduled Meds:  aspirin EC  81 mg Oral Daily   atorvastatin  80 mg Oral QPM   dofetilide  500 mcg Oral BID   furosemide  40 mg Intravenous Q12H   insulin aspart  0-5 Units Subcutaneous QHS   insulin aspart  0-9 Units Subcutaneous TID WC   insulin glargine-yfgn  30 Units Subcutaneous Daily   metoprolol tartrate  25 mg Oral BID   oxymetazoline  1 spray Each Nare BID   potassium chloride  40 mEq Oral Q4H   sacubitril-valsartan  1 tablet Oral BID   Continuous Infusions:  heparin 1,500 Units/hr (05/15/21 0313)   PRN Meds: acetaminophen, albuterol, calcium carbonate, dextromethorphan-guaiFENesin, diazepam, diphenhydrAMINE, hydrALAZINE, LORazepam, morphine injection, naphazoline-pheniramine, nitroGLYCERIN, traZODone   Vital Signs    Vitals:   05/14/21 2024 05/14/21 2324 05/15/21 0549 05/15/21 0737  BP: (!) 144/106 (!) 143/96 (!) 136/99 (!) 141/90  Pulse: 99 (!) 103 (!) 109 98  Resp: 20 20 20 17   Temp: 97.7 F (36.5 C) (!) 97.5 F (36.4 C) 97.6 F (36.4 C) 98.1 F (36.7 C)  TempSrc: Oral Oral Oral   SpO2: 96% 95% 97% 96%  Weight:   119.1 kg   Height:  Intake/Output Summary (Last 24 hours) at 05/15/2021 0829 Last data filed at 05/15/2021 0555 Gross per 24 hour  Intake 877.01 ml  Output 3675 ml  Net -2797.99 ml    Filed Weights   05/13/21 1747 05/14/21 0624 05/15/21 0549  Weight: 121.2 kg 120.4 kg 119.1 kg    Telemetry    Afib 80s to lwo 100s bpm - Personally Reviewed  ECG    No new  tracings - Personally Reviewed  Physical Exam   GEN: No acute distress.   Neck: JVD mildly elevated. Cardiac: IRIR, no murmurs, rubs, or gallops.  Respiratory: Faint crackles along the bases bilaterally.  GI: Soft, nontender, non-distended.   MS: Trivial pretibial edema bilaterally; No deformity. Neuro:  Alert and oriented x 3; Nonfocal.  Psych: Normal affect.  Labs    Chemistry Recent Labs  Lab 05/13/21 0747 05/14/21 0543 05/15/21 0546  NA 135 134*  --   K 3.3* 3.6 3.5  CL 99 100  --   CO2 26 28  --   GLUCOSE 170* 209*  --   BUN 13 21  --   CREATININE 0.87 0.99  --   CALCIUM 9.7 9.3  --   PROT 7.4  --   --   ALBUMIN 4.1  --   --   AST 20  --   --   ALT 19  --   --   ALKPHOS 106  --   --   BILITOT 1.1  --   --   GFRNONAA >60 >60  --   ANIONGAP 10 6  --       Hematology Recent Labs  Lab 05/13/21 0747 05/14/21 0543 05/15/21 0546  WBC 11.6* 8.7 12.2*  RBC 5.23 4.88 4.73  HGB 15.8 15.2 14.8  HCT 48.0 43.6 43.2  MCV 91.8 89.3 91.3  MCH 30.2 31.1 31.3  MCHC 32.9 34.9 34.3  RDW 15.9* 15.7* 15.8*  PLT 303 249 273     Cardiac EnzymesNo results for input(s): TROPONINI in the last 168 hours. No results for input(s): TROPIPOC in the last 168 hours.   BNP Recent Labs  Lab 05/13/21 0747  BNP 2,012.2*      DDimer No results for input(s): DDIMER in the last 168 hours.   Radiology    DG Chest Portable 1 View  Result Date: 05/13/2021 IMPRESSION: CHF. Electronically Signed   By: Rudie Meyer M.D.   On: 05/13/2021 08:15    Cardiac Studies   R/LHC 03/2021: Ost RCA to Prox RCA lesion is 100% stenosed. Mid LM to Dist LM lesion is 90% stenosed. Ost LAD to Prox LAD lesion is 100% stenosed. Ost Cx to Prox Cx lesion is 50% stenosed. Mid Cx to Dist Cx lesion is 60% stenosed. 3rd Mrg lesion is 90% stenosed. 1st LPL lesion is 90% stenosed. 2nd LPL lesion is 90% stenosed. SVG graft was visualized by angiography. Prox Graft to Mid Graft lesion is 30%  stenosed. Origin to Prox Graft lesion is 100% stenosed. LIMA graft was visualized by angiography and is normal in caliber. The graft exhibits no disease. Dist LAD lesion is 60% stenosed. Mid LAD lesion is 70% stenosed.   1.  Severe underlying left main and three-vessel coronary artery disease with patent LIMA to LAD and patent SVG to OM2/ramus.  Occluded SVG to OM 3.  The RCA is known to be small and nondominant and occluded proximally with some left-to-right collaterals. 2.  Left ventricular angiography was not performed.  EF was moderately  reduced by echo. 3.  Right heart catheterization showed mildly elevated wedge pressure at 20 mmHg with a prominent V wave, mild pulmonary hypertension and moderately reduced cardiac output.   Recommendations: Recommend continuing medical therapy for coronary artery disease.  No good options to revascularize the OM 3 and LPL distributions given the need to stent the left main, mid left circumflex and 3 branches of the left circumflex.  Risks probably outweigh the benefit. The best option is to treat medically including optimizing heart failure and rhythm control. The patient is still volume overloaded.  Continue IV diuresis for another day and likely switch to oral diuretic tomorrow. Resume heparin drip in 6 hours and can switch to Eliquis tomorrow morning if no bleeding issues from the cath site. __________   2D echo 02/2021: 1. Left ventricular ejection fraction, by estimation, is 35 to 40%. The  left ventricle has moderately decreased function. Left ventricular  endocardial border not optimally defined to evaluate regional wall motion.  There is moderate asymmetric left  ventricular hypertrophy of the posterior segment. Left ventricular  diastolic function could not be evaluated.   2. Right ventricular systolic function was not well visualized. The right  ventricular size is not well visualized. Mildly increased right  ventricular wall thickness.    3. The mitral valve is degenerative. Unable to accurately assess mitral  valve regurgitation.   4. The aortic valve is tricuspid. There is mild calcification of the  aortic valve. There is mild thickening of the aortic valve. Aortic valve  regurgitation not well assessed. Mild to moderate aortic valve  sclerosis/calcification is present, without any  evidence of aortic stenosis.  __________   2D echo 06/2020: 1. Left ventricular ejection fraction, by estimation, is 40 to 45%. The  left ventricle has mildly decreased function. The left ventricle  demonstrates global hypokinesis. The left ventricular internal cavity size  was moderately dilated. There is moderate   left ventricular hypertrophy. Left ventricular diastolic parameters are  consistent with Grade I diastolic dysfunction (impaired relaxation).   2. Right ventricular systolic function is moderately reduced. The right  ventricular size is normal. Mildly increased right ventricular wall  thickness. There is normal pulmonary artery systolic pressure.   3. Left atrial size was mildly dilated.   4. Right atrial size was mildly dilated.   5. The mitral valve is grossly normal. Trivial mitral valve  regurgitation.   6. The aortic valve is tricuspid. There is mild thickening of the aortic  valve. Aortic valve regurgitation is not visualized. Mild aortic valve  sclerosis is present, with no evidence of aortic valve stenosis.   7. Aortic dilatation noted. There is mild dilatation of the transverse  aorta, measuring 34 mm.   8. The inferior vena cava is normal in size with greater than 50%  respiratory variability, suggesting right atrial pressure of 3 mmHg. __________   2D echo 03/2018: - Left ventricle: The cavity size was mildly dilated. There was    mild concentric hypertrophy. Systolic function was mildly to    moderately reduced. The estimated ejection fraction was in the    range of 40% to 45%. Probable hypokinesis of the     mid-apicalanteroseptal, anterior, and apical myocardium. Doppler    parameters are consistent with abnormal left ventricular    relaxation (grade 1 diastolic dysfunction).  - Left atrium: The atrium was mildly dilated.  - Right atrium: The atrium was mildly dilated. __________   LHC 08/2013: Distal left main 60%, mid LAD  80%, D3 30%, OM1 90%, left AV groove 40%, LPL1 90%, LPL2 90%, RCA 100%. EF 35%. Recommendation: CABG.   Patient Profile     60 y.o. 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 Clyde Lundborg.  Assessment & Plan    1. CAD involving the native coronary arteries and bypass grafts with NSTEMI: -No chest pain -High-sensitivity troponin peaked at 1570 -Transition off heparin gtt, resume Eliquis as below -Recent LHC in 03/2021 demonstrated severe underlying multivessel CAD as outlined above with recommendation to continue medical therapy given that there were no good targets for revascularization and risk was felt to outweigh benefit -Continue ASA with known severe multivessel CAD -Aggressive risk factor modification and secondary prevention recommended   2.  Acute on chronic combined systolic and diastolic CHF/ICM: -Appears to be in the setting of excessive water intake -Transition from IV Lasix (already received dose this AM) to oral 40 mg bid -Await BMP this morning  -Carvedilol was discontinued by [facility] in 04/2021 with symptomatic bradycardia -Tolerating metoprolol this admission, transition from Lopressor to Toprol given his cardiomyopathy    -PTA Farxiga and Entresto -Consider addition of MRA in the outpatient setting pending labs -No longer on BiDil secondary to relative hypotension   3.  Persistent A. fib: -He developed Afib with RVR in the evening of 9/9 and remains in Afib with controlled ventricular response on Cardizem gtt a 5 mg/hr -Continue Tikosyn as his QTc is stable and chronically prolonged in the setting of RBBB -Continue to hold Lexapro and trazodone  -Beta blocker was held following admission to [facility] in 04/2021 secondary to symptomatic bradycardia, tolerating metoprolol this admission  -Given his cardiomyopathy, transition Lopressor to Toprol XL as above -Replete potassium as below -Magnesium at goal 2.0 -CHA2DS2-VASc at least 4 (CHF, HTN, DM, vascular disease) -Resume Eliquis today -Hydroxyzine discontinued by [facility] in 04/2021, as this was felt to be contributing to QT prolongation with noted improvement in QT following discontinuation of medication   4.  HTN: -Blood pressure mildly elevated this morning  -Medical therapy as above   5.  HLD: -LDL 40 -PTA atorvastatin   6. Hypokalemia: -Replete to goal 4.0   For questions or updates, please contact [facility] Please consult [email or web address] for contact info under Cardiology/STEMI.    Signed, Eula Listen, PA-C [facility] Pager: [phone] 05/15/2021, 8:29 AM

## 2021-05-15 NOTE — Consult Note (Signed)
ANTICOAGULATION CONSULT NOTE   Pharmacy Consult for heparin infusion Indication: chest pain/ACS  Allergies  Allergen Reactions   Spironolactone Other (See Comments)    HYPERKALEMIA    Patient Measurements: Height: 6\' 1"  (185.4 cm) Weight: 119.1 kg (262 lb 8 oz) IBW/kg (Calculated) : 79.9 Heparin Dosing Weight: 106.3 kg   Vital Signs: Temp: 97.6 F (36.4 C) (09/11 0549) Temp Source: Oral (09/11 0549) BP: 136/99 (09/11 0549) Pulse Rate: 109 (09/11 0549)  Labs: Recent Labs    05/13/21 0747 05/13/21 1006 05/13/21 1744 05/13/21 2053 05/14/21 0050 05/14/21 0218 05/14/21 0543 05/15/21 0546  HGB 15.8  --   --   --   --   --  15.2 14.8  HCT 48.0  --   --   --   --   --  43.6 43.2  PLT 303  --   --   --   --   --  249 273  APTT  --    < > 34  --  78*  --  79* 67*  HEPARINUNFRC  --   --  >1.10*  --   --   --  >1.10* 0.66  CREATININE 0.87  --   --   --   --   --  0.99  --   TROPONINIHS 43*   < > 1,552* 1,570* 748* 985*  --   --    < > = values in this interval not displayed.     Estimated Creatinine Clearance: 104.6 mL/min (by C-G formula based on SCr of 0.99 mg/dL).   Medical History: Past Medical History:  Diagnosis Date   Anxiety    Chronic systolic heart failure (HCC)    a. 07/2013 Echo: Ejection fraction of 45-50% with possible inferior wall hypokinesis; b. 03/2018 Echo: EF 40-45%; c. 06/2020 Echo: EF 40-45%, glob HK. Mod LVH, gr1 DD. Nl PASP. Mild BAE. Triv MR. Mild AoV sclerosis. Mild Ao dil (31mm).   Coronary artery disease    Significant left main and Severe three-vessel coronary artery disease in December of 2014. He underwent CABG in January of 2015 with LIMA to LAD, sequential SVG to ramus/OM 2 and SVG to OM 3   Diabetes mellitus without complication (HCC)    Hyperlipidemia    Hypertension    Ischemic cardiomyopathy    a. 07/2013 Echo: EF 45-50%; b. 03/2018 Echo: EF 40-45%; c. 06/2020 Echo: EF 40-45%.   Medical non-compliance    MI (myocardial infarction)  (HCC)    Obesity    PAD (peripheral artery disease) (HCC)    a. 04/2020 PTA of L PT.   PAF (paroxysmal atrial fibrillation) (HCC)    Tobacco abuse     Medications:  Apixaban 5 mg BID PTA-- last dose 05/12/21 evening   Assessment: 60 year old male with history of atrial fibrillation on apixaban 5 mg BID PTA presented with respiratory distress. Troponin 480 > 1300. Pharmacy has been consulted to transition to heparin therapy.  Baseline HL/ aPTT ordered   9/10 0050 aPTT 78, therapeutic x 1 9/10 0543 aPTT 79, therapeutic x 2  (HL >1.10, not correlating) 9/11 0546 aPTT 67, slightly therapeutic, HL 0.66  Goal of Therapy:  Heparin level 0.3-0.7 units/ml aPTT 66-102 seconds Monitor platelets by anticoagulation protocol: Yes     Plan:  Increase heparin infusion to 1600 units/hr Recheck aPTT in 6 hours to confirm Daily HL while on heparin. Switch to HL when level correlating Continue to monitor H&H and platelets  11/11, PharmD,  [facility] 05/15/2021 6:36 AM

## 2021-05-15 NOTE — Discharge Summary (Signed)
Physician Discharge Summary  HARDEEP REETZ WPY:[id number] DOB: 1960-11-19 DOA: 05/13/2021  PCP: [facility]  Admit date: 05/13/2021 Discharge date: 05/15/2021  Admitted From: Home Disposition: Home  Recommendations for Outpatient Follow-up:  Follow up with PCP in 1-2 weeks Follow-up with cardiology in 1 to 2 weeks Follow-up in heart failure clinic. Please obtain BMP/CBC in one week Please follow up on the following pending results: None  Home Health: No Equipment/Devices: None Discharge Condition: Stable CODE STATUS: Full Diet recommendation: Heart Healthy / Carb Modified   Brief/Interim Summary: Marc [name] is a 60 y.o. male with medical history significant of  hypertension, hyperlipidemia, diabetes mellitus, tobacco abuse, CAD, CABG, sCHF with EF of 35-40%, ischemic colitis, aortic aneurysm with dissection, atrial fibrillation on Eliquis, CKD stage IIIa, anxiety, left foot ray amputation, right toe amputation, who presents with worsening dyspnea.  Denies any chest pain. He was found to be in respiratory distress and desaturating at 83% on room air, initially requiring BiPAP.  Now stable at room air.  Patient does not use any oxygen at home. Also found to have elevated BNP at 2012, troponin elevated and peaked at 1570, not trending down. Chest x-ray concerning for vascular congestion and interstitial pulmonary edema.   Patient had a recent cardiac catheterization which shows extensive triple-vessel disease and medical management was recommended by cardiology at that time.   Cardiology was again consulted and he was started on heparin infusion for 48 hours.  No further cardiac work-up needed at this time.  Plan is to continue with medical management and close follow-up as an outpatient for further recommendations.  There was no chest pain or shortness of breath before discharge.  He also received IV Lasix while in the hospital and discharged on home dose of Lasix 40  mg daily.  Appears euvolemic on discharge.  Patient with history of paroxysmal atrial fibrillation, rate well controlled.  He will continue Tikosyn with metoprolol and Eliquis.  Patient has an history of type 2 diabetes mellitus.  Current A1c improved to 6.9 from 7.8 during prior check.  He will continue with current regimen and follow-up with his PCP for further recommendations.  Patient will continue rest of his home medications and follow-up with his providers.  Discharge Diagnoses:  Principal Problem:   Acute on chronic systolic CHF (congestive heart failure) (HCC) Active Problems:   Hypertension   Hyperlipidemia   CAD (coronary artery disease)   Atrial fibrillation (HCC)   Type II diabetes mellitus with renal manifestations (HCC)   CKD (chronic kidney disease), stage IIIa   Anxiety   Hypokalemia   NSTEMI (non-ST elevated myocardial infarction) (HCC)   Acute respiratory failure with hypoxia (HCC)   Elevated lactic acid level   Leukocytosis   Discharge Instructions  Discharge Instructions     (HEART FAILURE PATIENTS) Call MD:  Anytime you have any of the following symptoms: 1) 3 pound weight gain in 24 hours or 5 pounds in 1 week 2) shortness of breath, with or without a dry hacking cough 3) swelling in the hands, feet or stomach 4) if you have to sleep on extra pillows at night in order to breathe.   Complete by: As directed    Diet - low sodium heart healthy   Complete by: As directed    Discharge instructions   Complete by: As directed    It was pleasure taking care of you. Continue taking your current medications and follow-up with your primary care doctor and cardiologist closely  for further management. Restrict your fluid to 1.5 L daily   Increase activity slowly   Complete by: As directed       Allergies as of 05/15/2021       Reactions   Spironolactone Other (See Comments)   HYPERKALEMIA        Medication List     STOP taking these medications     insulin aspart 100 UNIT/ML injection Commonly known as: novoLOG       TAKE these medications    acetaminophen 500 MG tablet Commonly known as: TYLENOL Take 1,000 mg by mouth every 6 (six) hours as needed for moderate pain or headache.   apixaban 5 MG Tabs tablet Commonly known as: ELIQUIS Take 1 tablet (5 mg total) by mouth 2 (two) times daily.   aspirin 81 MG EC tablet Take 1 tablet (81 mg total) by mouth daily. Swallow whole.   atorvastatin 80 MG tablet Commonly known as: LIPITOR Take 80 mg by mouth every evening.   calcium carbonate 500 MG chewable tablet Commonly known as: TUMS - dosed in mg elemental calcium Chew 500 mg by mouth daily as needed for indigestion or heartburn.   dapagliflozin propanediol 10 MG Tabs tablet Commonly known as: FARXIGA Take 1 tablet (10 mg total) by mouth daily.   diazepam 5 MG tablet Commonly known as: VALIUM Take 5 mg by mouth every 8 (eight) hours as needed (severe anxiety).   dofetilide 500 MCG capsule Commonly known as: Tikosyn Take 1 capsule (500 mcg total) by mouth 2 (two) times daily. It is important you do not miss any doses of this medication. Call the office if you miss 3 doses. Make sure you notify the office with plenty of time for any refills.   Entresto 49-51 MG Generic drug: sacubitril-valsartan Take 1 tablet by mouth 2 (two) times daily.   escitalopram 5 MG tablet Commonly known as: LEXAPRO Take 5 mg by mouth at bedtime.   furosemide 40 MG tablet Commonly known as: LASIX Take 1 tablet (40 mg total) by mouth daily.   hydrOXYzine 25 MG tablet Commonly known as: ATARAX/VISTARIL Take 25 mg by mouth every 6 (six) hours as needed for anxiety. What changed: Another medication with the same name was removed. Continue taking this medication, and follow the directions you see here.   isosorbide-hydrALAZINE 20-37.5 MG tablet Commonly known as: BIDIL Take 1 tablet by mouth 3 (three) times daily.   Lantus SoloStar 100  UNIT/ML Solostar Pen Generic drug: insulin glargine Inject 42 Units into the skin daily. What changed: how much to take   metoprolol succinate 50 MG 24 hr tablet Commonly known as: TOPROL-XL Take 1 tablet (50 mg total) by mouth daily. Take with or immediately following a meal. Start taking on: May 16, 2021   nitroGLYCERIN 0.4 MG SL tablet Commonly known as: NITROSTAT Place 1 tablet (0.4 mg total) under the tongue every 5 (five) minutes x 3 doses as needed for chest pain.   potassium chloride 10 MEQ tablet Commonly known as: KLOR-CON Take 10 mEq by mouth daily.   VISINE-A OP Place 1 drop into both eyes daily as needed (allergies).        Follow-up Information     [facility]. Schedule an appointment as soon as possible for a visit.   Contact information: [address] [place] [address] [phone]         Debbe Odea, MD .   Specialties: Cardiology, Radiology Contact information: [address]  [place] [address] [phone]                Allergies  Allergen Reactions   Spironolactone Other (See Comments)    HYPERKALEMIA    Consultations: Cardiology  Procedures/Studies: DG Chest Portable 1 View  Result Date: 05/13/2021 CLINICAL DATA:  Respiratory distress. EXAM: PORTABLE CHEST 1 VIEW COMPARISON:  Chest x-ray 01/2021 FINDINGS: The heart is mildly enlarged but stable. Stable surgical changes cardiac surgery. Mild tortuosity and calcification of the thoracic aorta is again noted. Vascular congestion and interstitial edema suggesting CHF. No definite pleural effusions. No focal infiltrates. IMPRESSION: CHF. Electronically Signed   By: Rudie MeyerP.  Gallerani M.D.   On: 05/13/2021 08:15    Subjective: Patient was seen and examined today.  Denies any chest pain or shortness of breath.  He wants to go home.  No other complaints.  Discharge Exam: Vitals:   05/15/21 0549 05/15/21 0737  BP: (!) 136/99 (!) 141/90  Pulse:  (!) 109 98  Resp: 20 17  Temp: 97.6 F (36.4 C) 98.1 F (36.7 C)  SpO2: 97% 96%   Vitals:   05/14/21 2024 05/14/21 2324 05/15/21 0549 05/15/21 0737  BP: (!) 144/106 (!) 143/96 (!) 136/99 (!) 141/90  Pulse: 99 (!) 103 (!) 109 98  Resp: 20 20 20 17   Temp: 97.7 F (36.5 C) (!) 97.5 F (36.4 C) 97.6 F (36.4 C) 98.1 F (36.7 C)  TempSrc: Oral Oral Oral Oral  SpO2: 96% 95% 97% 96%  Weight:   119.1 kg   Height:        General: Pt is alert, awake, not in acute distress Cardiovascular: RRR, S1/S2 +, no rubs, no gallops Respiratory: CTA bilaterally, no wheezing, no rhonchi Abdominal: Soft, NT, ND, bowel sounds + Extremities: no edema, no cyanosis   The results of significant diagnostics from this hospitalization (including imaging, microbiology, ancillary and laboratory) are listed below for reference.    Microbiology: Recent Results (from the past 240 hour(s))  Resp Panel by RT-PCR (Flu A&B, Covid) Nasopharyngeal Swab     Status: None   Collection Time: 05/13/21  7:47 AM   Specimen: Nasopharyngeal Swab; Nasopharyngeal(NP) swabs in vial transport medium  Result Value Ref Range Status   SARS Coronavirus 2 by RT PCR NEGATIVE NEGATIVE Final    Comment: (NOTE) SARS-CoV-2 target nucleic acids are NOT DETECTED.  The SARS-CoV-2 RNA is generally detectable in upper respiratory specimens during the acute phase of infection. The lowest concentration of SARS-CoV-2 viral copies this assay can detect is 138 copies/mL. A negative result does not preclude SARS-Cov-2 infection and should not be used as the sole basis for treatment or other patient management decisions. A negative result may occur with  improper specimen collection/handling, submission of specimen other than nasopharyngeal swab, presence of viral mutation(s) within the areas targeted by this assay, and inadequate number of viral copies(<138 copies/mL). A negative result must be combined with clinical observations, patient  history, and epidemiological information. The expected result is Negative.  Fact Sheet for Patients:  BloggerCourse.comhttps://www.fda.gov/media/152166/download  Fact Sheet for Healthcare Providers:  SeriousBroker.ithttps://www.fda.gov/media/152162/download  This test is no t yet approved or cleared by the [place] FDA and  has been authorized for detection and/or diagnosis of SARS-CoV-2 by FDA under an Emergency Use Authorization (EUA). This EUA will remain  in effect (meaning this test can be used) for the duration of the COVID-19 declaration under Section 564(b)(1) of the Act, 21 U.S.C.section 360bbb-3(b)(1), unless the authorization is terminated  or revoked sooner.  Influenza A by PCR NEGATIVE NEGATIVE Final   Influenza B by PCR NEGATIVE NEGATIVE Final    Comment: (NOTE) The Xpert Xpress SARS-CoV-2/FLU/RSV plus assay is intended as an aid in the diagnosis of influenza from Nasopharyngeal swab specimens and should not be used as a sole basis for treatment. Nasal washings and aspirates are unacceptable for Xpert Xpress SARS-CoV-2/FLU/RSV testing.  Fact Sheet for Patients: BloggerCourse.com  Fact Sheet for Healthcare Providers: SeriousBroker.it  This test is not yet approved or cleared by the [place] FDA and has been authorized for detection and/or diagnosis of SARS-CoV-2 by FDA under an Emergency Use Authorization (EUA). This EUA will remain in effect (meaning this test can be used) for the duration of the COVID-19 declaration under Section 564(b)(1) of the Act, 21 U.S.C. section 360bbb-3(b)(1), unless the authorization is terminated or revoked.  Performed at [facility], [address] [place], [place] [address]      Labs: BNP (last 3 results) Recent Labs    01/06/21 0834 03/01/21 0351 05/13/21 0747  BNP 441.7* 920.7* 2,012.2*   Basic Metabolic Panel: Recent Labs  Lab 05/13/21 0747 05/14/21 0543 05/15/21 0514  05/15/21 0546  NA 135 134* 137  --   K 3.3* 3.6 3.5 3.5  CL 99 100 101  --   CO2 26 28 28   --   GLUCOSE 170* 209* 143*  --   BUN 13 21 21   --   CREATININE 0.87 0.99 0.86  --   CALCIUM 9.7 9.3 9.4  --   MG 2.2 2.3  --  2.1   Liver Function Tests: Recent Labs  Lab 05/13/21 0747  AST 20  ALT 19  ALKPHOS 106  BILITOT 1.1  PROT 7.4  ALBUMIN 4.1   No results for input(s): LIPASE, AMYLASE in the last 168 hours. No results for input(s): AMMONIA in the last 168 hours. CBC: Recent Labs  Lab 05/13/21 0747 05/14/21 0543 05/15/21 0546  WBC 11.6* 8.7 12.2*  NEUTROABS 8.8*  --   --   HGB 15.8 15.2 14.8  HCT 48.0 43.6 43.2  MCV 91.8 89.3 91.3  PLT 303 249 273   Cardiac Enzymes: No results for input(s): CKTOTAL, CKMB, CKMBINDEX, TROPONINI in the last 168 hours. BNP: Invalid input(s): POCBNP CBG: Recent Labs  Lab 05/14/21 0816 05/14/21 1140 05/14/21 1629 05/14/21 2149 05/15/21 0736  GLUCAP 195* 214* 199* 193* 151*   D-Dimer No results for input(s): DDIMER in the last 72 hours. Hgb A1c Recent Labs    05/13/21 1444  HGBA1C 6.9*   Lipid Profile Recent Labs    05/14/21 0543  CHOL 115  HDL 43  LDLCALC 62  TRIG 52  CHOLHDL 2.7   Thyroid function studies No results for input(s): TSH, T4TOTAL, T3FREE, THYROIDAB in the last 72 hours.  Invalid input(s): FREET3 Anemia work up No results for input(s): VITAMINB12, FOLATE, FERRITIN, TIBC, IRON, RETICCTPCT in the last 72 hours. Urinalysis    Component Value Date/Time   COLORURINE YELLOW (A) 01/06/2021 1200   APPEARANCEUR CLEAR (A) 01/06/2021 1200   APPEARANCEUR Clear 09/18/2014 2018   LABSPEC 1.013 01/06/2021 1200   LABSPEC 1.005 09/18/2014 2018   PHURINE 7.0 01/06/2021 1200   GLUCOSEU 50 (A) 01/06/2021 1200   GLUCOSEU 150 mg/dL 25/18/9842 1031   HGBUR NEGATIVE 01/06/2021 1200   BILIRUBINUR NEGATIVE 01/06/2021 1200   BILIRUBINUR Negative 09/18/2014 2018   KETONESUR NEGATIVE 01/06/2021 1200   PROTEINUR  NEGATIVE 01/06/2021 1200   UROBILINOGEN 0.2 09/12/2013 1555  NITRITE NEGATIVE 01/06/2021 1200   LEUKOCYTESUR NEGATIVE 01/06/2021 1200   LEUKOCYTESUR Negative 09/18/2014 2018   Sepsis Labs Invalid input(s): PROCALCITONIN,  WBC,  LACTICIDVEN Microbiology Recent Results (from the past 240 hour(s))  Resp Panel by RT-PCR (Flu A&B, Covid) Nasopharyngeal Swab     Status: None   Collection Time: 05/13/21  7:47 AM   Specimen: Nasopharyngeal Swab; Nasopharyngeal(NP) swabs in vial transport medium  Result Value Ref Range Status   SARS Coronavirus 2 by RT PCR NEGATIVE NEGATIVE Final    Comment: (NOTE) SARS-CoV-2 target nucleic acids are NOT DETECTED.  The SARS-CoV-2 RNA is generally detectable in upper respiratory specimens during the acute phase of infection. The lowest concentration of SARS-CoV-2 viral copies this assay can detect is 138 copies/mL. A negative result does not preclude SARS-Cov-2 infection and should not be used as the sole basis for treatment or other patient management decisions. A negative result may occur with  improper specimen collection/handling, submission of specimen other than nasopharyngeal swab, presence of viral mutation(s) within the areas targeted by this assay, and inadequate number of viral copies(<138 copies/mL). A negative result must be combined with clinical observations, patient history, and epidemiological information. The expected result is Negative.  Fact Sheet for Patients:  BloggerCourse.com  Fact Sheet for Healthcare Providers:  SeriousBroker.it  This test is no t yet approved or cleared by the [place] FDA and  has been authorized for detection and/or diagnosis of SARS-CoV-2 by FDA under an Emergency Use Authorization (EUA). This EUA will remain  in effect (meaning this test can be used) for the duration of the COVID-19 declaration under Section 564(b)(1) of the Act, 21 U.S.C.section  360bbb-3(b)(1), unless the authorization is terminated  or revoked sooner.       Influenza A by PCR NEGATIVE NEGATIVE Final   Influenza B by PCR NEGATIVE NEGATIVE Final    Comment: (NOTE) The Xpert Xpress SARS-CoV-2/FLU/RSV plus assay is intended as an aid in the diagnosis of influenza from Nasopharyngeal swab specimens and should not be used as a sole basis for treatment. Nasal washings and aspirates are unacceptable for Xpert Xpress SARS-CoV-2/FLU/RSV testing.  Fact Sheet for Patients: BloggerCourse.com  Fact Sheet for Healthcare Providers: SeriousBroker.it  This test is not yet approved or cleared by the [place] FDA and has been authorized for detection and/or diagnosis of SARS-CoV-2 by FDA under an Emergency Use Authorization (EUA). This EUA will remain in effect (meaning this test can be used) for the duration of the COVID-19 declaration under Section 564(b)(1) of the Act, 21 U.S.C. section 360bbb-3(b)(1), unless the authorization is terminated or revoked.  Performed at [facility], [address] [place], [place] [address]     Time coordinating discharge: Over 30 minutes  SIGNED:  Arnetha Courser, MD  [facility] 05/15/2021, 11:45 AM  If 7PM-7AM, please contact night-coverage [email or web address]  This record has been created using Conservation officer, historic buildings. Errors have been sought and corrected,but may not always be located. Such creation errors do not reflect on the standard of care.

## 2021-05-24 ENCOUNTER — Ambulatory Visit (INDEPENDENT_AMBULATORY_CARE_PROVIDER_SITE_OTHER): Payer: Medicaid Other | Admitting: Cardiology

## 2021-05-24 ENCOUNTER — Encounter: Payer: Self-pay | Admitting: Cardiology

## 2021-05-24 ENCOUNTER — Encounter: Payer: Self-pay | Admitting: Family

## 2021-05-24 ENCOUNTER — Ambulatory Visit: Payer: Medicaid Other | Admitting: Family

## 2021-05-24 ENCOUNTER — Ambulatory Visit: Payer: Medicaid Other | Attending: Family | Admitting: Family

## 2021-05-24 ENCOUNTER — Other Ambulatory Visit: Payer: Self-pay

## 2021-05-24 VITALS — BP 116/62 | HR 78 | Ht 73.0 in | Wt 256.0 lb

## 2021-05-24 VITALS — BP 105/67 | HR 107 | Resp 18 | Ht 73.0 in | Wt 256.0 lb

## 2021-05-24 DIAGNOSIS — Z951 Presence of aortocoronary bypass graft: Secondary | ICD-10-CM | POA: Diagnosis not present

## 2021-05-24 DIAGNOSIS — I4819 Other persistent atrial fibrillation: Secondary | ICD-10-CM | POA: Diagnosis not present

## 2021-05-24 DIAGNOSIS — I10 Essential (primary) hypertension: Secondary | ICD-10-CM

## 2021-05-24 DIAGNOSIS — Z8249 Family history of ischemic heart disease and other diseases of the circulatory system: Secondary | ICD-10-CM | POA: Insufficient documentation

## 2021-05-24 DIAGNOSIS — I110 Hypertensive heart disease with heart failure: Secondary | ICD-10-CM | POA: Insufficient documentation

## 2021-05-24 DIAGNOSIS — I5020 Unspecified systolic (congestive) heart failure: Secondary | ICD-10-CM

## 2021-05-24 DIAGNOSIS — I480 Paroxysmal atrial fibrillation: Secondary | ICD-10-CM | POA: Insufficient documentation

## 2021-05-24 DIAGNOSIS — I2510 Atherosclerotic heart disease of native coronary artery without angina pectoris: Secondary | ICD-10-CM | POA: Insufficient documentation

## 2021-05-24 DIAGNOSIS — E1122 Type 2 diabetes mellitus with diabetic chronic kidney disease: Secondary | ICD-10-CM | POA: Diagnosis not present

## 2021-05-24 DIAGNOSIS — Z794 Long term (current) use of insulin: Secondary | ICD-10-CM | POA: Diagnosis not present

## 2021-05-24 DIAGNOSIS — I5032 Chronic diastolic (congestive) heart failure: Secondary | ICD-10-CM | POA: Insufficient documentation

## 2021-05-24 DIAGNOSIS — N1831 Chronic kidney disease, stage 3a: Secondary | ICD-10-CM

## 2021-05-24 DIAGNOSIS — I255 Ischemic cardiomyopathy: Secondary | ICD-10-CM

## 2021-05-24 DIAGNOSIS — E1151 Type 2 diabetes mellitus with diabetic peripheral angiopathy without gangrene: Secondary | ICD-10-CM | POA: Insufficient documentation

## 2021-05-24 NOTE — Patient Instructions (Signed)
Continue weighing daily and call for an overnight weight gain of > 2 pounds or a weekly weight gain of >5 pounds. 

## 2021-05-24 NOTE — Progress Notes (Signed)
Patient ID: Marc [name], male    DOB: February 25, 1961, 60 y.o.   MRN: [id number]  HPI  Mr Genova is a 60 y/o male with a history of CAD, DM, hyperlipidemia, HTN, anxiety, paroxysmal atrial fibrillation, PAD, recent tobacco use and chronic heart failure.   Echo report from 03/01/21 reviewed and showed an EF of 35-40% along with moderate LVH.  RHC/LHC completed 03/04/21 and showed: Ost RCA to Prox RCA lesion is 100% stenosed. Mid LM to Dist LM lesion is 90% stenosed. Ost LAD to Prox LAD lesion is 100% stenosed. Ost Cx to Prox Cx lesion is 50% stenosed. Mid Cx to Dist Cx lesion is 60% stenosed. 3rd Mrg lesion is 90% stenosed. 1st LPL lesion is 90% stenosed. 2nd LPL lesion is 90% stenosed. SVG graft was visualized by angiography. Prox Graft to Mid Graft lesion is 30% stenosed. Origin to Prox Graft lesion is 100% stenosed. LIMA graft was visualized by angiography and is normal in caliber. The graft exhibits no disease. Dist LAD lesion is 60% stenosed. Mid LAD lesion is 70% stenosed.   1.  Severe underlying left main and three-vessel coronary artery disease with patent LIMA to LAD and patent SVG to OM2/ramus.  Occluded SVG to OM 3.  The RCA is known to be small and nondominant and occluded proximally with some left-to-right collaterals. 2.  Left ventricular angiography was not performed.  EF was moderately reduced by echo. 3.  Right heart catheterization showed mildly elevated wedge pressure at 20 mmHg with a prominent V wave, mild pulmonary hypertension and moderately reduced cardiac output.  Admitted 05/13/21 due to worsening dyspnea. Cardiology consult obtained. Initially started on heparin infustion. Given IV lasix with transition to oral diuretics. Discharged after 2 days. Was in the ED 05/08/21 with acute on chronic HF where he was treated and released. Admitted and ED visit in August. Admitted 03/01/21 due to acute on chronic HF. Initially given IV lasix with transition to oral diuretics.  Cardiology consult obtained. Cath completed. AF converted to NSR. Dofetilide started. Discharged after 7 days.   He presents today with a chief complaint of a follow-up visit. He currently denies any symptoms and specifically denies any dizziness, difficulty sleeping, abdominal distention, palpitations, pedal edema, chest pain, shortness of breath, cough, fatigue or weight gain.   Past Medical History:  Diagnosis Date   Anxiety    Chronic systolic heart failure (HCC)    a. 07/2013 Echo: Ejection fraction of 45-50% with possible inferior wall hypokinesis; b. 03/2018 Echo: EF 40-45%; c. 06/2020 Echo: EF 40-45%, glob HK. Mod LVH, gr1 DD. Nl PASP. Mild BAE. Triv MR. Mild AoV sclerosis. Mild Ao dil (28mm).   Coronary artery disease    Significant left main and Severe three-vessel coronary artery disease in December of 2014. He underwent CABG in January of 2015 with LIMA to LAD, sequential SVG to ramus/OM 2 and SVG to OM 3   Diabetes mellitus without complication (HCC)    Hyperlipidemia    Hypertension    Ischemic cardiomyopathy    a. 07/2013 Echo: EF 45-50%; b. 03/2018 Echo: EF 40-45%; c. 06/2020 Echo: EF 40-45%.   Medical non-compliance    MI (myocardial infarction) (HCC)    Obesity    PAD (peripheral artery disease) (HCC)    a. 04/2020 PTA of L PT.   PAF (paroxysmal atrial fibrillation) (HCC)    Tobacco abuse    Past Surgical History:  Procedure Laterality Date   AMPUTATION Left 04/08/2020   Procedure: AMPUTATION  RAY;  Surgeon: Rosetta Posner, DPM;  Location: [facility];  Service: Podiatry;  Laterality: Left;   AMPUTATION Left 06/25/2020   Procedure: AMPUTATION RAY 1ST RAY PARTIAL LEFT;  Surgeon: Rosetta Posner, DPM;  Location: [facility];  Service: Podiatry;  Laterality: Left;   AMPUTATION TOE Right 02/29/2016   Procedure: AMPUTATION TOE;  Surgeon: Linus Galas, DPM;  Location: [facility];  Service: Podiatry;  Laterality: Right;  Great toe   CARDIAC CATHETERIZATION     [facility] no stent    CARDIOVERSION N/A 02/23/2021   Procedure: CARDIOVERSION;  Surgeon: Debbe Odea, MD;  Location: [facility];  Service: Cardiovascular;  Laterality: N/A;   CORONARY ARTERY BYPASS GRAFT N/A 09/16/2013   Procedure: CORONARY ARTERY BYPASS GRAFTING (CABG);  Surgeon: [name], MD;  Location: [facility] OR;  Service: Open Heart Surgery;  Laterality: N/A;  CABG x four, using left internal mammary artery and right leg greater saphenous vein   INTRAOPERATIVE TRANSESOPHAGEAL ECHOCARDIOGRAM N/A 09/16/2013   Procedure: INTRAOPERATIVE TRANSESOPHAGEAL ECHOCARDIOGRAM;  Surgeon: [name], MD;  Location: [facility] OR;  Service: Open Heart Surgery;  Laterality: N/A;   knee surgery Right    LASIK Bilateral    LOWER EXTREMITY ANGIOGRAPHY Left 04/07/2020   Procedure: Lower Extremity Angiography;  Surgeon: Renford Dills, MD;  Location: [facility];  Service: Cardiovascular;  Laterality: Left;   LOWER EXTREMITY ANGIOGRAPHY Left 04/09/2020   Procedure: Lower Extremity Angiography (Pedal Approach);  Surgeon: Renford Dills, MD;  Location: [facility];  Service: Cardiovascular;  Laterality: Left;   RIGHT/LEFT HEART CATH AND CORONARY/GRAFT ANGIOGRAPHY N/A 03/04/2021   Procedure: RIGHT/LEFT HEART CATH AND CORONARY/GRAFT ANGIOGRAPHY;  Surgeon: Iran Ouch, MD;  Location: [facility];  Service: Cardiovascular;  Laterality: N/A;   Family History  Problem Relation Age of Onset   Heart disease Mother    Social History   Tobacco Use   Smoking status: Former    Packs/day: 0.50    Years: 45.00    Pack years: 22.50    Types: Cigarettes    Quit date: 02/09/2021    Years since quitting: 0.2   Smokeless tobacco: Never  Substance Use Topics   Alcohol use: No    Alcohol/week: 0.0 standard drinks   Allergies  Allergen Reactions   Spironolactone Other (See Comments)    HYPERKALEMIA   Prior to Admission medications   Medication Sig Start Date End Date Taking? Authorizing Provider   acetaminophen (TYLENOL) 500 MG tablet Take 1,000 mg by mouth every 6 (six) hours as needed for moderate pain or headache.   Yes [provider]  apixaban (ELIQUIS) 5 MG TABS tablet Take 1 tablet (5 mg total) by mouth 2 (two) times daily. 05/06/21  Yes Debbe Odea, MD  atorvastatin (LIPITOR) 80 MG tablet Take 80 mg by mouth every evening.  01/31/18  Yes [provider]  calcium carbonate (TUMS - DOSED IN MG ELEMENTAL CALCIUM) 500 MG chewable tablet Chew 500 mg by mouth daily as needed for indigestion or heartburn.   Yes [provider]  dapagliflozin propanediol (FARXIGA) 10 MG TABS tablet Take 1 tablet (10 mg total) by mouth daily. 03/08/21  Yes Marrion Coy, MD  diazepam (VALIUM) 5 MG tablet Take 5 mg by mouth every 8 (eight) hours as needed (severe anxiety).   Yes [provider]  dofetilide (TIKOSYN) 500 MCG capsule Take 1 capsule (500 mcg total) by mouth 2 (two) times daily. It is important you do not miss any  doses of this medication. Call the office if you miss 3 doses. Make sure you notify the office with plenty of time for any refills. 05/06/21  Yes Agbor-Etang, Arlys John, MD  insulin glargine (LANTUS SOLOSTAR) 100 UNIT/ML Solostar Pen Inject 42 Units into the skin daily. Patient taking differently: Inject 50 Units into the skin daily. 01/07/21  Yes Rai, Ripudeep K, MD  isosorbide-hydrALAZINE (BIDIL) 20-37.5 MG tablet Take 1 tablet by mouth 3 (three) times daily.   Yes [provider]  metoprolol succinate (TOPROL-XL) 50 MG 24 hr tablet Take 1 tablet (50 mg total) by mouth daily. Take with or immediately following a meal. 05/16/21  Yes Arnetha Courser, MD  Naphazoline-Pheniramine (VISINE-A OP) Place 1 drop into both eyes daily as needed (allergies).   Yes [provider]  nitroGLYCERIN (NITROSTAT) 0.4 MG SL tablet Place 1 tablet (0.4 mg total) under the tongue every 5 (five) minutes x 3 doses as needed for chest pain. 05/15/21  Yes Arnetha Courser, MD   potassium chloride (KLOR-CON) 10 MEQ tablet Take 10 mEq by mouth daily. 02/08/21  Yes [provider]  sacubitril-valsartan (ENTRESTO) 97-103 MG Take 1 tablet by mouth 2 (two) times daily.   Yes [provider]  torsemide (DEMADEX) 20 MG tablet Take 40 mg by mouth daily.   Yes [provider]   Review of Systems  Constitutional:  Negative for appetite change and fatigue.  HENT:  Negative for congestion, postnasal drip and sore throat.   Eyes: Negative.   Respiratory:  Negative for cough, chest tightness and shortness of breath.   Cardiovascular:  Negative for chest pain, palpitations and leg swelling.  Gastrointestinal:  Negative for abdominal distention and abdominal pain.  Endocrine: Negative.   Genitourinary: Negative.   Musculoskeletal:  Negative for back pain and neck pain.  Skin: Negative.   Allergic/Immunologic: Negative.   Neurological:  Negative for dizziness and light-headedness.  Hematological:  Negative for adenopathy. Does not bruise/bleed easily.  Psychiatric/Behavioral:  Negative for dysphoric mood and sleep disturbance (sleeping on 1 pillow). The patient is not nervous/anxious.    Vitals:   05/24/21 0922  BP: 105/67  Pulse: (!) 107  Resp: 18  SpO2: 97%  Weight: 256 lb (116.1 kg)  Height: 6\' 1"  (1.854 m)   Wt Readings from Last 3 Encounters:  05/24/21 256 lb (116.1 kg)  05/24/21 256 lb (116.1 kg)  05/15/21 262 lb 8 oz (119.1 kg)   Lab Results  Component Value Date   CREATININE 0.86 05/15/2021   CREATININE 0.99 05/14/2021   CREATININE 0.87 05/13/2021    Physical Exam Vitals and nursing note reviewed.  Constitutional:      Appearance: Normal appearance.  HENT:     Head: Normocephalic and atraumatic.  Cardiovascular:     Rate and Rhythm: Tachycardia present. Rhythm irregular.  Pulmonary:     Effort: Pulmonary effort is normal. No respiratory distress.     Breath sounds: No wheezing or rales.  Abdominal:     General: There is  no distension.     Palpations: Abdomen is soft.     Tenderness: There is no abdominal tenderness.  Musculoskeletal:        General: No tenderness.     Cervical back: Normal range of motion and neck supple.     Right lower leg: No edema.     Left lower leg: No edema.  Skin:    General: Skin is warm and dry.  Neurological:     General: No focal deficit  present.     Mental Status: He is alert and oriented to person, place, and time.  Psychiatric:        Mood and Affect: Mood normal.        Behavior: Behavior normal.        Thought Content: Thought content normal.    Assessment & Plan:  1: Chronic heart failure with reduced ejection fraction- - NYHA class I - euvolemic today - weighing daily; reminded to weigh daily and call for an overnight weight gain of > 2 pounds or a weekly weight gain of > 5 pounds - weight up 5 pounds from last visit here 2 months ago - not adding salt and has been reading food labels for sodium content - saw cardiology (Agbor-Etang) earlier today - on GDMT of carvedilol, dapagliflozine and entresto - history of hyperkalemia so hasn't been on aldactone - BNP 9/98/22 was 2012.2  2: HTN- - BP looks good today - sees PCP at [facility] - BMP 05/15/21 reviewed and showed sodium 137, potassium 3.5, creatinine 0.862 and GFR >60  3: DM- - A1c 05/13/21 was 6.9% - glucose at home today was 116  4: Atrial fibrillation- - went to a fib clinic 03/15/21 - magnesium 05/15/21 was 2.1   Medication bottles reviewed.   Return in 3 months or sooner for any questions/problems before then.

## 2021-05-24 NOTE — Progress Notes (Signed)
Cardiology Office Note:    Date:  05/24/2021   ID:  Marc [name], DOB 1960-12-14, MRN [id number]  PCP:  [facility] Cardiologist:  Debbe Odea, MD  [facility] Electrophysiologist:  None   Referring MD: [facility]   Chief Complaint  Patient presents with   Other    2 month follow up -- Patient c/o SOB. Patient was in the ED 09/09 for SOB. Meds reviewed verbally with patient.     History of Present Illness:    Marc [name] is a 60 y.o. male with a hx of CAD/CABG x 4 2015 ([place] to LAD SVG to Ramus /OM2, OM3) hypertension, hyperlipidemia, ICM EF 35-40%%, PAD, persistent A. Fib, former smoker x 40+who presents for follow-up.  Being seen for CHF and atrial fibrillation.  On Tikosyn for A. fib..  Blood pressures were low after last visit with associated dizziness, BiDil was stopped.  Recently admitted to the hospital due to shortness of breath.  Diuresed with Lasix and discharged on oral Lasix 40 mg daily.  He states not diuresing adequately on oral Lasix, started taking torsemide 40 mg yesterday with much better diuresis and improvement in breathing.  Denies palpitations, but states noticing elevated heart rates.  Currently takes Toprol-XL 50 mg daily, Entresto 97-1 03, Farxiga, BiDil.  Blood pressures which she checks frequently at home have been normal.  Prior notes Echo 02/2021, EF 35 to 40%, aortic valve sclerosis, mild MR [facility] 03/07/2021 showing patent LIMA to LAD, patent SVG to OM2/ramus.  Occluded SVG to OM 3.  Small nondominant RCA occluded proximally. CAD/CABG x 4 2015 (Lima to LAD SVG to Ramus /OM2, OM3) Echo 06/2020 mild to moderately reduced EF, 40 to 45%. Echo 03/2018 EF 40 to 45% Not on Aldactone due to CKD and hyperkalemia history.  Patient has history of nonhealing ulceration of the left foot, underwent a left great toe amputation with subsequent wound.  He is being followed up by vascular surgery.  Noninvasive  studies have shown noncompressible vessels on the right with ABIs 1.07.  Podiatry is planning resection of left foot wound to improve wound healing.   Past Medical History:  Diagnosis Date   Anxiety    Chronic systolic heart failure (HCC)    a. 07/2013 Echo: Ejection fraction of 45-50% with possible inferior wall hypokinesis; b. 03/2018 Echo: EF 40-45%; c. 06/2020 Echo: EF 40-45%, glob HK. Mod LVH, gr1 DD. Nl PASP. Mild BAE. Triv MR. Mild AoV sclerosis. Mild Ao dil (28mm).   Coronary artery disease    Significant left main and Severe three-vessel coronary artery disease in December of 2014. He underwent CABG in January of 2015 with LIMA to LAD, sequential SVG to ramus/OM 2 and SVG to OM 3   Diabetes mellitus without complication (HCC)    Hyperlipidemia    Hypertension    Ischemic cardiomyopathy    a. 07/2013 Echo: EF 45-50%; b. 03/2018 Echo: EF 40-45%; c. 06/2020 Echo: EF 40-45%.   Medical non-compliance    MI (myocardial infarction) (HCC)    Obesity    PAD (peripheral artery disease) (HCC)    a. 04/2020 PTA of L PT.   PAF (paroxysmal atrial fibrillation) (HCC)    Tobacco abuse     Past Surgical History:  Procedure Laterality Date   AMPUTATION Left 04/08/2020   Procedure: AMPUTATION RAY;  Surgeon: Rosetta Posner, DPM;  Location: [facility];  Service: Podiatry;  Laterality: Left;   AMPUTATION Left 06/25/2020  Procedure: AMPUTATION RAY 1ST RAY PARTIAL LEFT;  Surgeon: Rosetta Posner, DPM;  Location: [facility];  Service: Podiatry;  Laterality: Left;   AMPUTATION TOE Right 02/29/2016   Procedure: AMPUTATION TOE;  Surgeon: Linus Galas, DPM;  Location: [facility];  Service: Podiatry;  Laterality: Right;  Great toe   CARDIAC CATHETERIZATION     [facility] no stent   CARDIOVERSION N/A 02/23/2021   Procedure: CARDIOVERSION;  Surgeon: Debbe Odea, MD;  Location: [facility];  Service: Cardiovascular;  Laterality: N/A;   CORONARY ARTERY BYPASS GRAFT N/A 09/16/2013   Procedure: CORONARY ARTERY BYPASS  GRAFTING (CABG);  Surgeon: [name], MD;  Location: [facility] OR;  Service: Open Heart Surgery;  Laterality: N/A;  CABG x four, using left internal mammary artery and right leg greater saphenous vein   INTRAOPERATIVE TRANSESOPHAGEAL ECHOCARDIOGRAM N/A 09/16/2013   Procedure: INTRAOPERATIVE TRANSESOPHAGEAL ECHOCARDIOGRAM;  Surgeon: [name], MD;  Location: [facility] OR;  Service: Open Heart Surgery;  Laterality: N/A;   knee surgery Right    LASIK Bilateral    LOWER EXTREMITY ANGIOGRAPHY Left 04/07/2020   Procedure: Lower Extremity Angiography;  Surgeon: Renford Dills, MD;  Location: [facility];  Service: Cardiovascular;  Laterality: Left;   LOWER EXTREMITY ANGIOGRAPHY Left 04/09/2020   Procedure: Lower Extremity Angiography (Pedal Approach);  Surgeon: Renford Dills, MD;  Location: [facility];  Service: Cardiovascular;  Laterality: Left;   RIGHT/LEFT HEART CATH AND CORONARY/GRAFT ANGIOGRAPHY N/A 03/04/2021   Procedure: RIGHT/LEFT HEART CATH AND CORONARY/GRAFT ANGIOGRAPHY;  Surgeon: Iran Ouch, MD;  Location: [facility];  Service: Cardiovascular;  Laterality: N/A;    Current Medications: Current Meds  Medication Sig   acetaminophen (TYLENOL) 500 MG tablet Take 1,000 mg by mouth every 6 (six) hours as needed for moderate pain or headache.   apixaban (ELIQUIS) 5 MG TABS tablet Take 1 tablet (5 mg total) by mouth 2 (two) times daily.   atorvastatin (LIPITOR) 80 MG tablet Take 80 mg by mouth every evening.    calcium carbonate (TUMS - DOSED IN MG ELEMENTAL CALCIUM) 500 MG chewable tablet Chew 500 mg by mouth daily as needed for indigestion or heartburn.   dapagliflozin propanediol (FARXIGA) 10 MG TABS tablet Take 1 tablet (10 mg total) by mouth daily.   diazepam (VALIUM) 5 MG tablet Take 5 mg by mouth every 8 (eight) hours as needed (severe anxiety).   dofetilide (TIKOSYN) 500 MCG capsule Take 1 capsule (500 mcg total) by mouth 2 (two) times daily. It is important  you do not miss any doses of this medication. Call the office if you miss 3 doses. Make sure you notify the office with plenty of time for any refills.   insulin glargine (LANTUS SOLOSTAR) 100 UNIT/ML Solostar Pen Inject 42 Units into the skin daily. (Patient taking differently: Inject 50 Units into the skin daily.)   isosorbide-hydrALAZINE (BIDIL) 20-37.5 MG tablet Take 1 tablet by mouth 3 (three) times daily.   metoprolol succinate (TOPROL-XL) 50 MG 24 hr tablet Take 1 tablet (50 mg total) by mouth daily. Take with or immediately following a meal.   Naphazoline-Pheniramine (VISINE-A OP) Place 1 drop into both eyes daily as needed (allergies).   nitroGLYCERIN (NITROSTAT) 0.4 MG SL tablet Place 1 tablet (0.4 mg total) under the tongue every 5 (five) minutes x 3 doses as needed for chest pain.   potassium chloride (KLOR-CON) 10 MEQ tablet Take 10 mEq by mouth daily.   sacubitril-valsartan (ENTRESTO) 97-103 MG Take  1 tablet by mouth 2 (two) times daily.   torsemide (DEMADEX) 20 MG tablet Take 40 mg by mouth daily.   [DISCONTINUED] aspirin EC 81 MG EC tablet Take 1 tablet (81 mg total) by mouth daily. Swallow whole. (Patient not taking: Reported on 05/24/2021)     Allergies:   Spironolactone   Social History   Socioeconomic History   Marital status: Divorced    Spouse name: Not on file   Number of children: Not on file   Years of education: Not on file   Highest education level: Not on file  Occupational History   Not on file  Tobacco Use   Smoking status: Former    Packs/day: 0.50    Years: 45.00    Pack years: 22.50    Types: Cigarettes    Quit date: 02/09/2021    Years since quitting: 0.2   Smokeless tobacco: Never  Vaping Use   Vaping Use: Never used  Substance and Sexual Activity   Alcohol use: No    Alcohol/week: 0.0 standard drinks   Drug use: No   Sexual activity: Not on file  Other Topics Concern   Not on file  Social History Narrative   Not on file   Social  Determinants of Health   Financial Resource Strain: Not on file  Food Insecurity: Not on file  Transportation Needs: Not on file  Physical Activity: Not on file  Stress: Not on file  Social Connections: Not on file     Family History: The patient's family history includes Heart disease in his mother.  ROS:   Please see the history of present illness.     All other systems reviewed and are negative.  EKGs/Labs/Other Studies Reviewed:    The following studies were reviewed today:   EKG:  EKG is  ordered today.  The ekg ordered today demonstrates atrial fibrillation, right bundle branch block  Recent Labs: 01/21/2021: TSH 1.130 05/13/2021: ALT 19; B Natriuretic Peptide 2,012.2 05/15/2021: BUN 21; Creatinine, Ser 0.86; Hemoglobin 14.8; Magnesium 2.1; Platelets 273; Potassium 3.5; Sodium 137  Recent Lipid Panel    Component Value Date/Time   CHOL 115 05/14/2021 0543   CHOL 135 07/21/2013 0759   TRIG 52 05/14/2021 0543   TRIG 152 07/21/2013 0759   HDL 43 05/14/2021 0543   HDL 39 (L) 07/21/2013 0759   CHOLHDL 2.7 05/14/2021 0543   VLDL 10 05/14/2021 0543   VLDL 30 07/21/2013 0759   LDLCALC 62 05/14/2021 0543   LDLCALC 66 07/21/2013 0759     Risk Assessment/Calculations:      Physical Exam:    VS:  BP 116/62 (BP Location: Left Arm, Patient Position: Sitting, Cuff Size: Normal)   Pulse 78   Ht 6\' 1"  (1.854 m)   Wt 256 lb (116.1 kg)   SpO2 97%   BMI 33.78 kg/m     Wt Readings from Last 3 Encounters:  05/24/21 256 lb (116.1 kg)  05/24/21 256 lb (116.1 kg)  05/15/21 262 lb 8 oz (119.1 kg)     GEN:  Well nourished, well developed in no acute distress HEENT: Normal NECK: No JVD; No carotid bruits CARDIAC: Irregular irregular RESPIRATORY:  Clear to auscultation without rales, wheezing or rhonchi  ABDOMEN: Soft, non-tender, distended MUSCULOSKELETAL:  trace edema. SKIN: Warm and dry NEUROLOGIC:  Alert and oriented x 3 PSYCHIATRIC:  Normal affect   ASSESSMENT:     1. Persistent atrial fibrillation (HCC)   2. Ischemic cardiomyopathy   3. Coronary  artery disease involving native coronary artery of native heart without angina pectoris     PLAN:    In order of problems listed above:  Persistent A. fib, CHA2DS2-VASc of 4.  Currently in A. fib on Tikosyn.  Heart rate controlled.  Continue Toprol-XL, Eliquis.  Schedule follow-up with EP for additional input.  Unsure if being volume overloaded triggered A. fib. CAD, status post CABG x4.  LHC 03/2021 occ SVG to OM 3.  Patent LIMA to LAD, patent SVG to ramus, OM 2.  Denies symptoms of chest pain.  Continue Eliquis, Lipitor.  Stop aspirin. Ischemic cardiomyopathy, EF 35-40%.  Describes NYHA class II-III symptoms.  Appears slightly volume overloaded, abdominal distention.  Continue torsemide 40 mg daily.  Check BMP in 1 week.  Continue Entresto, Toprol-XL, Farxiga, BiDil.  If BP runs low again, will consider stopping BiDil.   Follow-up in 3 months  Total encounter time 40 minutes  Greater than 50% was spent in counseling and coordination of care with the patient Has some mobility issues, advised to follow-up with PCP regarding PT/need for assistive devices.    Medication Adjustments/Labs and Tests Ordered: Current medicines are reviewed at length with the patient today.  Concerns regarding medicines are outlined above.  Orders Placed This Encounter  Procedures   Basic metabolic panel   Ambulatory referral to Cardiac Electrophysiology   EKG 12-Lead      No orders of the defined types were placed in this encounter.    Patient Instructions  Medication Instructions:  Your physician recommends that you continue on your current medications as directed. Please refer to the Current Medication list given to you today.  *If you need a refill on your cardiac medications before your next appointment, please call your pharmacy*   Lab Work:  Your physician recommends that you return for lab (BMP) work  in: 1 week   Please return to our office on _________________at ___________________am/pm   Testing/Procedures: None ordered   Follow-Up: At [facility], you and your health needs are our priority.  As part of our continuing mission to provide you with exceptional heart care, we have created designated Provider Care Teams.  These Care Teams include your primary Cardiologist (physician) and Advanced Practice Providers (APPs -  Physician Assistants and Nurse Practitioners) who all work together to provide you with the care you need, when you need it.  We recommend signing up for the patient portal called "MyChart".  Sign up information is provided on this After Visit Summary.  MyChart is used to connect with patients for Virtual Visits (Telemedicine).  Patients are able to view lab/test results, encounter notes, upcoming appointments, etc.  Non-urgent messages can be sent to your provider as well.   To learn more about what you can do with MyChart, go to [email or web address].    Your next appointment:   3 month(s)  The format for your next appointment:   In Person  Provider:    Debbe Odea, MD (3 months)  Also needs next available appointment Dr. Lalla Brothers   Other Instructions    Signed, Debbe Odea, MD  05/24/2021 10:24 AM    [facility]

## 2021-05-24 NOTE — Patient Instructions (Signed)
Medication Instructions:  Your physician recommends that you continue on your current medications as directed. Please refer to the Current Medication list given to you today.  *If you need a refill on your cardiac medications before your next appointment, please call your pharmacy*   Lab Work:  Your physician recommends that you return for lab (BMP) work in: 1 week   Please return to our office on _________________at ___________________am/pm   Testing/Procedures: None ordered   Follow-Up: At [facility], you and your health needs are our priority.  As part of our continuing mission to provide you with exceptional heart care, we have created designated Provider Care Teams.  These Care Teams include your primary Cardiologist (physician) and Advanced Practice Providers (APPs -  Physician Assistants and Nurse Practitioners) who all work together to provide you with the care you need, when you need it.  We recommend signing up for the patient portal called "MyChart".  Sign up information is provided on this After Visit Summary.  MyChart is used to connect with patients for Virtual Visits (Telemedicine).  Patients are able to view lab/test results, encounter notes, upcoming appointments, etc.  Non-urgent messages can be sent to your provider as well.   To learn more about what you can do with MyChart, go to [email or web address].    Your next appointment:   3 month(s)  The format for your next appointment:   In Person  Provider:    Debbe Odea, MD (3 months)  Also needs next available appointment Dr. Lalla Brothers   Other Instructions

## 2021-05-31 ENCOUNTER — Telehealth: Payer: Self-pay

## 2021-05-31 ENCOUNTER — Other Ambulatory Visit: Payer: Medicaid Other

## 2021-05-31 ENCOUNTER — Other Ambulatory Visit
Admission: RE | Admit: 2021-05-31 | Discharge: 2021-05-31 | Disposition: A | Discharge status: Home / Self Care | Payer: Medicaid Other | Attending: Cardiology | Admitting: Cardiology

## 2021-05-31 DIAGNOSIS — I4819 Other persistent atrial fibrillation: Secondary | ICD-10-CM | POA: Insufficient documentation

## 2021-05-31 LAB — BASIC METABOLIC PANEL
Anion gap: 15 (ref 5–15)
BUN: 31 mg/dL — ABNORMAL HIGH (ref 8–23)
CO2: 28 mmol/L (ref 22–32)
Calcium: 9.7 mg/dL (ref 8.9–10.3)
Chloride: 96 mmol/L — ABNORMAL LOW (ref 98–111)
Creatinine, Ser: 1.12 mg/dL (ref 0.61–1.24)
GFR, Estimated: >60 mL/min (ref >60)
Glucose, Bld: 140 mg/dL — ABNORMAL HIGH (ref 70–99)
Potassium: 3.1 mmol/L — ABNORMAL LOW (ref 3.5–5.1)
Sodium: 139 mmol/L (ref 135–145)

## 2021-05-31 MED ORDER — POTASSIUM CHLORIDE CRYS ER 20 MEQ PO TBCR: 20.0000 meq | EXTENDED_RELEASE_TABLET | Freq: Every day | ORAL | 5 refills | Status: DC

## 2021-05-31 NOTE — Telephone Encounter (Signed)
   Increase potassium/KCl to 20 mEq twice daily x5 days, reduce to 20 mEq daily after.  Check BMP in about 1 week when patient comes to clinic.

## 2021-05-31 NOTE — Telephone Encounter (Signed)
Called patient and gave him the instructions as stated in the below documentation. Patient verbalized understanding and agreed with plan. Prescription sent into pharmacy. Patient will have a BMP checked while in office to see Dr. Lalla Brothers on 06/08/21.

## 2021-06-07 NOTE — Progress Notes (Signed)
Electrophysiology Office Note:    Date:  06/08/2021   ID:  Marc [name], DOB 1960/09/13, MRN [id number]  PCP:  [facility] Cardiologist:  Debbe Odea, MD  [facility] Electrophysiologist:  Lanier Prude, MD   Referring MD: [facility]   Chief Complaint: Persistent atrial fibrillation  History of Present Illness:    Marc [name] is a 60 y.o. male who presents for an evaluation of persistent atrial fibrillation at the request of Dr Azucena Cecil. Their medical history includes CAD s/p CABG 4v in 2015, HTN, HLD, ICM EF35%, PAD, former tobacco abuse. He was last seen by Dr Azucena Cecil on 05/24/2021.   No syncope or presyncope.  Patient tells me he has a very low heart rate in the 40s and 50s.  He takes his medications without any problems.  He takes dofetilide twice daily.  He has not had any significant atrial fibrillation since starting Tikosyn.  He takes Eliquis twice daily without bleeding issues.   Past Medical History:  Diagnosis Date   Anxiety    Chronic systolic heart failure (HCC)    a. 07/2013 Echo: Ejection fraction of 45-50% with possible inferior wall hypokinesis; b. 03/2018 Echo: EF 40-45%; c. 06/2020 Echo: EF 40-45%, glob HK. Mod LVH, gr1 DD. Nl PASP. Mild BAE. Triv MR. Mild AoV sclerosis. Mild Ao dil (24mm).   Coronary artery disease    Significant left main and Severe three-vessel coronary artery disease in December of 2014. He underwent CABG in January of 2015 with LIMA to LAD, sequential SVG to ramus/OM 2 and SVG to OM 3   Diabetes mellitus without complication (HCC)    Hyperlipidemia    Hypertension    Ischemic cardiomyopathy    a. 07/2013 Echo: EF 45-50%; b. 03/2018 Echo: EF 40-45%; c. 06/2020 Echo: EF 40-45%.   Medical non-compliance    MI (myocardial infarction) (HCC)    Obesity    PAD (peripheral artery disease) (HCC)    a. 04/2020 PTA of L PT.   PAF (paroxysmal atrial fibrillation) (HCC)    Tobacco  abuse     Past Surgical History:  Procedure Laterality Date   AMPUTATION Left 04/08/2020   Procedure: AMPUTATION RAY;  Surgeon: Rosetta Posner, DPM;  Location: [facility] ORS;  Service: Podiatry;  Laterality: Left;   AMPUTATION Left 06/25/2020   Procedure: AMPUTATION RAY 1ST RAY PARTIAL LEFT;  Surgeon: Rosetta Posner, DPM;  Location: [facility] OR[facility];  Service: Podiatry;  Laterality: Left;   AMPUTATION TOE Right 02/29/2016   Procedure: AMPUTATION TOE;  Surgeon: Linus Galas, DPM;  Location: [facility] ORS;  Service: Podiatry;  Laterality: Right;  Great toe   CARDIAC CATHETERIZATION     [facility] no stent   CARDIOVERSION N/A 02/23/2021   Procedure: CARDIOVERSION;  Surgeon: Debbe Odea, MD;  Location: [facility] OR[facility];  Service: Cardiovascular;  Laterality: N/A;   CORONARY ARTERY BYPASS GRAFT N/A 09/16/2013   Procedure: CORONARY ARTERY BYPASS GRAFTING (CABG);  Surgeon: [name], MD;  Location: [facility] OR;  Service: Open Heart Surgery;  Laterality: N/A;  CABG x four, using left internal mammary artery and right leg greater saphenous vein   INTRAOPERATIVE TRANSESOPHAGEAL ECHOCARDIOGRAM N/A 09/16/2013   Procedure: INTRAOPERATIVE TRANSESOPHAGEAL ECHOCARDIOGRAM;  Surgeon: [name], MD;  Location: [facility] OR;  Service: Open Heart Surgery;  Laterality: N/A;   knee surgery Right    LASIK Bilateral    LOWER EXTREMITY ANGIOGRAPHY Left 04/07/2020   Procedure: Lower Extremity Angiography;  Surgeon: Renford Dills, MD;  Location: [facility] INVASIVE CV LAB;  Service: Cardiovascular;  Laterality: Left;   LOWER EXTREMITY ANGIOGRAPHY Left 04/09/2020   Procedure: Lower Extremity Angiography (Pedal Approach);  Surgeon: Renford Dills, MD;  Location: [facility] INVASIVE CV LAB;  Service: Cardiovascular;  Laterality: Left;   RIGHT/LEFT HEART CATH AND CORONARY/GRAFT ANGIOGRAPHY N/A 03/04/2021   Procedure: RIGHT/LEFT HEART CATH AND CORONARY/GRAFT ANGIOGRAPHY;  Surgeon: Iran Ouch, MD;  Location: [facility] INVASIVE CV LAB;  Service: Cardiovascular;   Laterality: N/A;    Current Medications: Current Meds  Medication Sig   acetaminophen (TYLENOL) 500 MG tablet Take 1,000 mg by mouth every 6 (six) hours as needed for moderate pain or headache.   apixaban (ELIQUIS) 5 MG TABS tablet Take 1 tablet (5 mg total) by mouth 2 (two) times daily.   atorvastatin (LIPITOR) 80 MG tablet Take 80 mg by mouth every evening.    calcium carbonate (TUMS - DOSED IN MG ELEMENTAL CALCIUM) 500 MG chewable tablet Chew 500 mg by mouth daily as needed for indigestion or heartburn.   dapagliflozin propanediol (FARXIGA) 10 MG TABS tablet Take 1 tablet (10 mg total) by mouth daily.   diazepam (VALIUM) 5 MG tablet Take 5 mg by mouth every 8 (eight) hours as needed (severe anxiety).   dofetilide (TIKOSYN) 500 MCG capsule Take 1 capsule (500 mcg total) by mouth 2 (two) times daily. It is important you do not miss any doses of this medication. Call the office if you miss 3 doses. Make sure you notify the office with plenty of time for any refills.   insulin glargine (LANTUS SOLOSTAR) 100 UNIT/ML Solostar Pen Inject 42 Units into the skin daily. (Patient taking differently: Inject 50 Units into the skin daily.)   isosorbide-hydrALAZINE (BIDIL) 20-37.5 MG tablet Take 1 tablet by mouth 3 (three) times daily.   metoprolol succinate (TOPROL-XL) 50 MG 24 hr tablet Take 1 tablet (50 mg total) by mouth daily. Take with or immediately following a meal.   Naphazoline-Pheniramine (VISINE-A OP) Place 1 drop into both eyes daily as needed (allergies).   nitroGLYCERIN (NITROSTAT) 0.4 MG SL tablet Place 1 tablet (0.4 mg total) under the tongue every 5 (five) minutes x 3 doses as needed for chest pain.   potassium chloride (KLOR-CON) 20 MEQ tablet Take 1 tablet (20 mEq total) by mouth daily. Take 1 tab by mouth twice a day for 5 days (05/31/21-06/04/21), and then take 1 tab by mouth once a day.   sacubitril-valsartan (ENTRESTO) 97-103 MG Take 1 tablet by mouth 2 (two) times daily.   torsemide  (DEMADEX) 20 MG tablet Take 40 mg by mouth daily.     Allergies:   Spironolactone   Social History   Socioeconomic History   Marital status: Divorced    Spouse name: Not on file   Number of children: Not on file   Years of education: Not on file   Highest education level: Not on file  Occupational History   Not on file  Tobacco Use   Smoking status: Former    Packs/day: 0.50    Years: 45.00    Pack years: 22.50    Types: Cigarettes    Quit date: 02/09/2021    Years since quitting: 0.3   Smokeless tobacco: Never  Vaping Use   Vaping Use: Never used  Substance and Sexual Activity   Alcohol use: No    Alcohol/week: 0.0 standard drinks   Drug use: No   Sexual activity: Not on file  Other Topics Concern  Not on file  Social History Narrative   Not on file   Social Determinants of Health   Financial Resource Strain: Not on file  Food Insecurity: Not on file  Transportation Needs: Not on file  Physical Activity: Not on file  Stress: Not on file  Social Connections: Not on file     Family History: The patient's family history includes Heart disease in his mother.  ROS:   Please see the history of present illness.    All other systems reviewed and are negative.  EKGs/Labs/Other Studies Reviewed:    The following studies were reviewed today:  03/01/2021 Echo personally reviewed EF35% Moderate LVH   EKG:  The ekg ordered today demonstrates wide right bundle branch block, QRS duration 180 ms.  Sinus rhythm.  Single PVC.  PR interval 238 ms.  Recent Labs: 01/21/2021: TSH 1.130 05/13/2021: ALT 19; B Natriuretic Peptide 2,012.2 05/15/2021: Hemoglobin 14.8; Magnesium 2.1; Platelets 273 05/31/2021: BUN 31; Creatinine, Ser 1.12; Potassium 3.1; Sodium 139  Recent Lipid Panel    Component Value Date/Time   CHOL 115 05/14/2021 0543   CHOL 135 07/21/2013 0759   TRIG 52 05/14/2021 0543   TRIG 152 07/21/2013 0759   HDL 43 05/14/2021 0543   HDL 39 (L) 07/21/2013 0759    CHOLHDL 2.7 05/14/2021 0543   VLDL 10 05/14/2021 0543   VLDL 30 07/21/2013 0759   LDLCALC 62 05/14/2021 0543   LDLCALC 66 07/21/2013 0759    Physical Exam:    VS:  BP (!) 108/58   Pulse (!) 49   Ht 6\' 1"  (1.854 m)   Wt 253 lb 9.6 oz (115 kg)   SpO2 97%   BMI 33.46 kg/m     Wt Readings from Last 3 Encounters:  06/08/21 253 lb 9.6 oz (115 kg)  05/24/21 256 lb (116.1 kg)  05/24/21 256 lb (116.1 kg)     GEN: Well nourished, well developed in no acute distress.  Obese. HEENT: Normal NECK: No JVD; No carotid bruits LYMPHATICS: No lymphadenopathy CARDIAC: RRR, no murmurs, rubs, gallops RESPIRATORY:  Clear to auscultation without rales, wheezing or rhonchi  ABDOMEN: Soft, non-tender, non-distended MUSCULOSKELETAL:  No edema; No deformity  SKIN: Warm and dry NEUROLOGIC:  Alert and oriented x 3 PSYCHIATRIC:  Normal affect   ASSESSMENT:    1. Persistent atrial fibrillation (HCC)   2. Ischemic cardiomyopathy   3. RBBB    PLAN:    In order of problems listed above:  #Persistent atrial fibrillation Maintaining normal rhythm on dofetilide 500 mcg twice daily.  On Eliquis for stroke prophylaxis.  We will repeat his BMP and magnesium level today given hypokalemia noted on September 27 lab work.  #Chronic systolic heart failure secondary to ischemic cardiomyopathy NYHA class II-III symptoms.  Warm and dry on exam today.  Continue BiDil, Toprol, Entresto and torsemide. I would like to get a cardiac MRI to get a better assessment of his left ventricular function, LGE burden.  If his EF is 35% or less, I would plan to implant a CRT-D for primary prevention of sudden cardiac death and to hopefully provide some resynchronization in the setting of his extremely wide right bundle branch block.  I did discuss the ICD briefly during today's visit.  He should take a dose of his already prescribed Valium an hour before the MRI.  I will plan to see him back in clinic in about 6 weeks after  his cardiac MRI.      Total time  spent with patient today 50 minutes. This includes reviewing records, evaluating the patient and coordinating care.  Medication Adjustments/Labs and Tests Ordered: Current medicines are reviewed at length with the patient today.  Concerns regarding medicines are outlined above.  Orders Placed This Encounter  Procedures   MR CARDIAC MORPHOLOGY W WO CONTRAST   Basic Metabolic Panel (BMET)   Magnesium   CBC w/Diff   EKG 12-Lead   No orders of the defined types were placed in this encounter.    Signed, Rossie Muskrat. Lalla Brothers, MD, [facility], [facility] 06/08/2021 8:24 AM    Electrophysiology [facility]

## 2021-06-08 ENCOUNTER — Encounter: Payer: Self-pay | Admitting: Cardiology

## 2021-06-08 ENCOUNTER — Other Ambulatory Visit: Payer: Self-pay

## 2021-06-08 ENCOUNTER — Ambulatory Visit (INDEPENDENT_AMBULATORY_CARE_PROVIDER_SITE_OTHER): Payer: Medicaid Other | Admitting: Cardiology

## 2021-06-08 VITALS — BP 108/58 | HR 49 | Ht 73.0 in | Wt 253.6 lb

## 2021-06-08 DIAGNOSIS — I4510 Unspecified right bundle-branch block: Secondary | ICD-10-CM | POA: Diagnosis not present

## 2021-06-08 DIAGNOSIS — I255 Ischemic cardiomyopathy: Secondary | ICD-10-CM | POA: Diagnosis not present

## 2021-06-08 DIAGNOSIS — I4819 Other persistent atrial fibrillation: Secondary | ICD-10-CM

## 2021-06-08 NOTE — Patient Instructions (Signed)
Medication Instructions:  Your physician recommends that you continue on your current medications as directed. Please refer to the Current Medication list given to you today. *If you need a refill on your cardiac medications before your next appointment, please call your pharmacy*  Lab Work: You will get lab work today:  BMP, mag, cbc  If you have labs (blood work) drawn today and your tests are completely normal, you will receive your results only by: MyChart Message (if you have MyChart) OR A paper copy in the mail If you have any lab test that is abnormal or we need to change your treatment, we will call you to review the results.  Testing/Procedures: Your physician has requested that you have a cardiac MRI. Cardiac MRI uses a computer to create images of your heart as its beating, producing both still and moving pictures of your heart and major blood vessels.    Follow-Up: At [facility], you and your health needs are our priority.  As part of our continuing mission to provide you with exceptional heart care, we have created designated Provider Care Teams.  These Care Teams include your primary Cardiologist (physician) and Advanced Practice Providers (APPs -  Physician Assistants and Nurse Practitioners) who all work together to provide you with the care you need, when you need it.  Your next appointment:   Your physician wants you to follow-up in: 6 weeks with Dr. Phillips Odor are scheduled for Cardiac MRI on _________________. Please arrive at the [place] main entrance of [facility] at ___________________ (30-45 minutes prior to test start time). ?  [facility]  [address]  [place], [place] [address]  [phone]  Proceed to the [facility] Radiology Department (First Floor).  ?  Magnetic resonance imaging (MRI) is a painless test that produces images of the inside of the body without using X-rays. During an MRI, strong magnets and radio waves  work together in a Data processing manager to form detailed images. MRI images may provide more details about a medical condition than X-rays, CT scans, and ultrasounds can provide.  You may be given earphones to listen for instructions.  You may eat a light breakfast and take medications as ordered with the exception of TORSEMIDE If a contrast material will be used, an IV will be inserted into one of your veins. Contrast material will be injected into your IV.  You will be asked to remove all metal, including: Watch, jewelry, and other metal objects including hearing aids, hair pieces and dentures. (Braces and fillings normally are not a problem.)  If contrast material was used:  It will leave your body through your urine within a day. You may be told to drink plenty of fluids to help flush the contrast material out of your system.  TEST WILL TAKE APPROXIMATELY 1 HOUR  PLEASE NOTIFY SCHEDULING AT LEAST 24 HOURS IN ADVANCE IF YOU ARE UNABLE TO KEEP YOUR APPOINTMENT.

## 2021-06-09 LAB — CBC WITH DIFFERENTIAL/PLATELET
Basophils Absolute: 0.1 10*3/uL (ref 0.0–0.2)
Basos: 1 %
EOS (ABSOLUTE): 0.2 10*3/uL (ref 0.0–0.4)
Eos: 3 %
Hematocrit: 47.2 % (ref 37.5–51.0)
Hemoglobin: 16 g/dL (ref 13.0–17.7)
Immature Grans (Abs): 0.1 10*3/uL (ref 0.0–0.1)
Immature Granulocytes: 1 %
Lymphocytes Absolute: 1 10*3/uL (ref 0.7–3.1)
Lymphs: 14 %
MCH: 29.5 pg (ref 26.6–33.0)
MCHC: 33.9 g/dL (ref 31.5–35.7)
MCV: 87 fL (ref 79–97)
Monocytes Absolute: 0.7 10*3/uL (ref 0.1–0.9)
Monocytes: 10 %
Neutrophils Absolute: 5 10*3/uL (ref 1.4–7.0)
Neutrophils: 71 %
Platelets: 249 10*3/uL (ref 150–450)
RBC: 5.42 x10E6/uL (ref 4.14–5.80)
RDW: 14.4 % (ref 11.6–15.4)
WBC: 7 10*3/uL (ref 3.4–10.8)

## 2021-06-09 LAB — BASIC METABOLIC PANEL
BUN/Creatinine Ratio: 15 (ref 10–24)
BUN: 19 mg/dL (ref 8–27)
CO2: 24 mmol/L (ref 20–29)
Calcium: 10.2 mg/dL (ref 8.6–10.2)
Chloride: 100 mmol/L (ref 96–106)
Creatinine, Ser: 1.26 mg/dL (ref 0.76–1.27)
Glucose: 105 mg/dL — ABNORMAL HIGH (ref 70–99)
Potassium: 3.9 mmol/L (ref 3.5–5.2)
Sodium: 139 mmol/L (ref 134–144)
eGFR: 64 mL/min/{1.73_m2} (ref >59)

## 2021-06-09 LAB — MAGNESIUM: Magnesium: 2.2 mg/dL (ref 1.6–2.3)

## 2021-06-14 LAB — BLOOD GAS, ARTERIAL
Acid-Base Excess: 1.2 mmol/L (ref 0.0–2.0)
Bicarbonate: 26.6 mmol/L (ref 20.0–28.0)
Delivery systems: POSITIVE
Expiratory PAP: 8
FIO2: 60
Inspiratory PAP: 16
O2 Saturation: 99.4 %
Patient temperature: 37
pCO2 arterial: 44 mmHg (ref 32.0–48.0)
pH, Arterial: 7.39 (ref 7.350–7.450)
pO2, Arterial: 156 mmHg — ABNORMAL HIGH (ref 83.0–108.0)

## 2021-07-08 ENCOUNTER — Telehealth (HOSPITAL_COMMUNITY): Payer: Self-pay | Admitting: Emergency Medicine

## 2021-07-08 ENCOUNTER — Encounter (HOSPITAL_COMMUNITY): Payer: Self-pay

## 2021-07-08 NOTE — Telephone Encounter (Signed)
Attempted to call patient regarding upcoming cardiac MR appointment. Left message on voicemail with name and callback number Rhiana Morash RN Navigator Cardiac Imaging [facility] [phone] Office [phone] Cell  

## 2021-07-08 NOTE — Telephone Encounter (Signed)
Pt returning phone call regarding upcoming cardiac imaging study; pt verbalizes understanding of appt date/time, parking situation and where to check in,  and verified current allergies; name and call back number provided for further questions should they arise Rockwell Alexandria RN Navigator Cardiac Imaging [facility] [phone] office [phone] cell  Pt taking 5mg  valium 30m-1h prior to scan Denies iv issues Denies metal implants other than sternal wires

## 2021-07-11 ENCOUNTER — Other Ambulatory Visit: Payer: Self-pay

## 2021-07-11 ENCOUNTER — Ambulatory Visit (HOSPITAL_COMMUNITY)
Admission: RE | Admit: 2021-07-11 | Discharge: 2021-07-11 | Disposition: A | Discharge status: Home / Self Care | Payer: Medicaid Other | Source: Ambulatory Visit | Attending: Cardiology | Admitting: Cardiology

## 2021-07-11 DIAGNOSIS — I4819 Other persistent atrial fibrillation: Secondary | ICD-10-CM | POA: Diagnosis present

## 2021-07-11 DIAGNOSIS — I255 Ischemic cardiomyopathy: Secondary | ICD-10-CM | POA: Diagnosis not present

## 2021-07-11 MED ORDER — GADOBUTROL 1 MMOL/ML IV SOLN
10.0000 mL | Freq: Once | INTRAVENOUS | Status: AC | PRN
  Administered 2021-07-11: 10 mL via INTRAVENOUS

## 2021-07-20 ENCOUNTER — Ambulatory Visit: Payer: Medicaid Other | Admitting: Cardiology

## 2021-07-26 NOTE — Progress Notes (Signed)
Electrophysiology Office Follow up Visit Note:    Date:  07/27/2021   ID:  Marc [name], DOB [birth year], MRN [id number]  PCP:  [place] Cardiologist:  Kate Sable, MD  [place] Electrophysiologist:  Vickie Epley, MD    Interval History:    Marc [name] is a 60 y.o. male who presents for a follow up visit.  I last saw the patient June 08, 2021 for persistent atrial fibrillation and ischemic cardiomyopathy.  He is maintained on Tikosyn for his atrial fibrillation.  We discussed getting an MRI to reassess his left ventricular function.  We plan to implant a CRT-D if the MRI showed an ejection fraction of less than 35%.       Past Medical History:  Diagnosis Date   Anxiety    Chronic systolic heart failure ([place])    a. 07/2013 Echo: Ejection fraction of 45-50% with possible inferior wall hypokinesis; b. 03/2018 Echo: EF 40-45%; c. 06/2020 Echo: EF 40-45%, glob HK. Mod LVH, gr1 DD. Nl PASP. Mild BAE. Triv MR. Mild AoV sclerosis. Mild Ao dil (36mm).   Coronary artery disease    Significant left main and Severe three-vessel coronary artery disease in December of 2014. He underwent CABG in January of 2015 with LIMA to LAD, sequential SVG to ramus/OM 2 and SVG to OM 3   Diabetes mellitus without complication ([place])    Hyperlipidemia    Hypertension    Ischemic cardiomyopathy    a. 07/2013 Echo: EF 45-50%; b. 03/2018 Echo: EF 40-45%; c. 06/2020 Echo: EF 40-45%.   Medical non-compliance    MI (myocardial infarction) ([place])    Obesity    PAD (peripheral artery disease) ([place])    a. 04/2020 PTA of L PT.   PAF (paroxysmal atrial fibrillation) ([place])    Tobacco abuse     Past Surgical History:  Procedure Laterality Date   AMPUTATION Left 04/08/2020   Procedure: AMPUTATION RAY;  Surgeon: Caroline More, DPM;  Location: [facility];  Service: Podiatry;  Laterality: Left;   AMPUTATION Left 06/25/2020   Procedure: AMPUTATION RAY 1ST RAY  PARTIAL LEFT;  Surgeon: Caroline More, DPM;  Location: [facility];  Service: Podiatry;  Laterality: Left;   AMPUTATION TOE Right 02/29/2016   Procedure: AMPUTATION TOE;  Surgeon: Sharlotte Alamo, DPM;  Location: [facility];  Service: Podiatry;  Laterality: Right;  Great toe   CARDIAC CATHETERIZATION     [facility] no stent   CARDIOVERSION N/A 02/23/2021   Procedure: CARDIOVERSION;  Surgeon: Kate Sable, MD;  Location: [facility];  Service: Cardiovascular;  Laterality: N/A;   CORONARY ARTERY BYPASS GRAFT N/A 09/16/2013   Procedure: CORONARY ARTERY BYPASS GRAFTING (CABG);  Surgeon: Gaye Pollack, MD;  Location: [place];  Service: Open Heart Surgery;  Laterality: N/A;  CABG x four, using left internal mammary artery and right leg greater saphenous vein   INTRAOPERATIVE TRANSESOPHAGEAL ECHOCARDIOGRAM N/A 09/16/2013   Procedure: INTRAOPERATIVE TRANSESOPHAGEAL ECHOCARDIOGRAM;  Surgeon: Gaye Pollack, MD;  Location: [facility];  Service: Open Heart Surgery;  Laterality: N/A;   knee surgery Right    LASIK Bilateral    LOWER EXTREMITY ANGIOGRAPHY Left 04/07/2020   Procedure: Lower Extremity Angiography;  Surgeon: Katha Cabal, MD;  Location: [facility];  Service: Cardiovascular;  Laterality: Left;   LOWER EXTREMITY ANGIOGRAPHY Left 04/09/2020   Procedure: Lower Extremity Angiography (Pedal Approach);  Surgeon: Katha Cabal, MD;  Location: [place] [facility];  Service: Cardiovascular;  Laterality: Left;   RIGHT/LEFT HEART CATH AND CORONARY/GRAFT ANGIOGRAPHY N/A 03/04/2021   Procedure: RIGHT/LEFT HEART CATH AND CORONARY/GRAFT ANGIOGRAPHY;  Surgeon: Iran Ouch, MD;  Location: [facility];  Service: Cardiovascular;  Laterality: N/A;    Current Medications: Current Meds  Medication Sig   acetaminophen (TYLENOL) 500 MG tablet Take 1,000 mg by mouth every 6 (six) hours as needed for moderate pain or headache.   apixaban (ELIQUIS) 5 MG TABS tablet Take 1 tablet (5 mg total) by mouth 2  (two) times daily.   atorvastatin (LIPITOR) 80 MG tablet Take 80 mg by mouth every evening.    calcium carbonate (TUMS - DOSED IN MG ELEMENTAL CALCIUM) 500 MG chewable tablet Chew 500 mg by mouth daily as needed for indigestion or heartburn.   dapagliflozin propanediol (FARXIGA) 10 MG TABS tablet Take 1 tablet (10 mg total) by mouth daily.   diazepam (VALIUM) 5 MG tablet Take 5 mg by mouth every 8 (eight) hours as needed (severe anxiety).   dofetilide (TIKOSYN) 500 MCG capsule Take 1 capsule (500 mcg total) by mouth 2 (two) times daily. It is important you do not miss any doses of this medication. Call the office if you miss 3 doses. Make sure you notify the office with plenty of time for any refills.   insulin glargine (LANTUS SOLOSTAR) 100 UNIT/ML Solostar Pen Inject 42 Units into the skin daily. (Patient taking differently: Inject 50 Units into the skin daily.)   isosorbide-hydrALAZINE (BIDIL) 20-37.5 MG tablet Take 1 tablet by mouth 3 (three) times daily.   metoprolol succinate (TOPROL-XL) 50 MG 24 hr tablet Take 1 tablet (50 mg total) by mouth daily. Take with or immediately following a meal.   Naphazoline-Pheniramine (VISINE-A OP) Place 1 drop into both eyes daily as needed (allergies).   nitroGLYCERIN (NITROSTAT) 0.4 MG SL tablet Place 1 tablet (0.4 mg total) under the tongue every 5 (five) minutes x 3 doses as needed for chest pain.   potassium chloride (KLOR-CON) 20 MEQ tablet Take 1 tablet (20 mEq total) by mouth daily. Take 1 tab by mouth twice a day for 5 days (05/31/21-06/04/21), and then take 1 tab by mouth once a day.   sacubitril-valsartan (ENTRESTO) 97-103 MG Take 1 tablet by mouth 2 (two) times daily.   torsemide (DEMADEX) 20 MG tablet Take 40 mg by mouth daily.     Allergies:   Spironolactone   Social History   Socioeconomic History   Marital status: Divorced    Spouse name: Not on file   Number of children: Not on file   Years of education: Not on file   Highest education  level: Not on file  Occupational History   Not on file  Tobacco Use   Smoking status: Former    Packs/day: 0.50    Years: 45.00    Pack years: 22.50    Types: Cigarettes    Quit date: 02/09/2021    Years since quitting: 0.4   Smokeless tobacco: Never  Vaping Use   Vaping Use: Never used  Substance and Sexual Activity   Alcohol use: No    Alcohol/week: 0.0 standard drinks   Drug use: No   Sexual activity: Not on file  Other Topics Concern   Not on file  Social History Narrative   Not on file   Social Determinants of Health   Financial Resource Strain: Not on file  Food Insecurity: Not on file  Transportation Needs: Not on file  Physical Activity: Not  on file  Stress: Not on file  Social Connections: Not on file     Family History: The patient's family history includes Heart disease in his mother.  ROS:   Please see the history of present illness.    All other systems reviewed and are negative.  EKGs/Labs/Other Studies Reviewed:    The following studies were reviewed today:    July 11, 2021 cardiac MRI IMPRESSION: 1.  Moderate to severely reduced LV function, LVEF = 31.4%. 2.  Mid-apical anterolateral and inferolateral wall akinesis. 3. Near transmural subendocardial scar (LGE) involving the entire lateral LV wall. 4.  Scar quantification limited by image quality and artifacts. 5.  LV lateral wall appears non viable. 6.  Normal RV size and function 7.  Findings consistent with ischemic cardiomyopathy.       EKG:  The ekg ordered today demonstrates sinus rhythm, wide right bundle branch block with a QRS duration of 200 ms.  QTc corrects to 468 ms in the setting of this wide right bundle branch block.  Recent Labs: 01/21/2021: TSH 1.130 05/13/2021: ALT 19; B Natriuretic Peptide 2,012.2 06/08/2021: BUN 19; Creatinine, Ser 1.26; Hemoglobin 16.0; Magnesium 2.2; Platelets 249; Potassium 3.9; Sodium 139  Recent Lipid Panel    Component Value Date/Time    CHOL 115 05/14/2021 0543   CHOL 135 07/21/2013 0759   TRIG 52 05/14/2021 0543   TRIG 152 07/21/2013 0759   HDL 43 05/14/2021 0543   HDL 39 (L) 07/21/2013 0759   CHOLHDL 2.7 05/14/2021 0543   VLDL 10 05/14/2021 0543   VLDL 30 07/21/2013 0759   LDLCALC 62 05/14/2021 0543   LDLCALC 66 07/21/2013 0759    Physical Exam:    VS:  BP 122/70   Pulse (!) 50   Ht 6\' 1"  (1.854 m)   Wt 251 lb 3.2 oz (113.9 kg)   SpO2 97%   BMI 33.14 kg/m     Wt Readings from Last 3 Encounters:  07/27/21 251 lb 3.2 oz (113.9 kg)  06/08/21 253 lb 9.6 oz (115 kg)  05/24/21 256 lb (116.1 kg)     GEN: Chronically ill-appearing in no acute distress.  Obese.   HEENT: Normal NECK: No JVD; No carotid bruits LYMPHATICS: No lymphadenopathy CARDIAC: RRR, no murmurs, rubs, gallops RESPIRATORY:  Clear to auscultation without rales, wheezing or rhonchi  ABDOMEN: Soft, non-tender, non-distended MUSCULOSKELETAL: Warm extremities, no deformity  SKIN: Warm and dry NEUROLOGIC:  Alert and oriented x 3 PSYCHIATRIC:  Normal affect        ASSESSMENT:    1. Chronic systolic heart failure ([place])   2. RBBB   3. Persistent atrial fibrillation ([facility])    PLAN:    In order of problems listed above:  #Chronic systolic heart failure #Right bundle branch block, wide NYHA class III symptoms.  Warm and relatively euvolemic today.  On good medical therapy with BiDil, Toprol, Entresto and torsemide.  Recent MRI shows dense scar in the lateral LV and an EF of 31%.  We discussed the CRT-D device again during today's clinic appointment including the risk, recovery and likelihood of a successful implant.  He wishes to proceed.  We will plan to implant a CRT-D with a coronary sinus lead.  If there is poor capture on the lateral wall because of the scar observed on MRI, would plan to put a lead in the left bundle area.  He will need to hold his Eliquis for 3 days prior to the procedure to minimize bleeding risk.  The patient has  an ischemic CM (EF 31%), NYHA Class III CHF, and CAD.  He is referred by Dr Garen Lah for risk stratification of sudden death and consideration of ICD implantation.  At this time, he meets criteria for ICD implantation for primary prevention of sudden death.  I have had a thorough discussion with the patient reviewing options.  The patient and their family (if available) have had opportunities to ask questions and have them answered. The patient and I have decided together through a shared decision making process to proceed with ICD implant at this time.    Risks, benefits, alternatives to ICD implantation were discussed in detail with the patient today. The patient understands that the risks include but are not limited to bleeding, infection, pneumothorax, perforation, tamponade, vascular damage, renal failure, MI, stroke, death, inappropriate shocks, and lead dislodgement and wishes to proceed.  We will therefore schedule device implantation at the next available time.  #Persistent atrial fibrillation #High risk drug monitoring Patient maintaining sinus rhythm on Tikosyn 500 mcg by mouth twice daily.  QTC acceptable when corrected for wide right bundle branch block.    We will plan for a [employer] CRT-D system.        Total time spent with patient today 45 minutes. This includes reviewing records, evaluating the patient and coordinating care.   Medication Adjustments/Labs and Tests Ordered: Current medicines are reviewed at length with the patient today.  Concerns regarding medicines are outlined above.  No orders of the defined types were placed in this encounter.  No orders of the defined types were placed in this encounter.    Signed, Lars Mage, MD, [facility], [facility] 07/27/2021 8:37 AM    Electrophysiology [facility]

## 2021-07-27 ENCOUNTER — Other Ambulatory Visit: Payer: Self-pay

## 2021-07-27 ENCOUNTER — Encounter: Payer: Self-pay | Admitting: Cardiology

## 2021-07-27 ENCOUNTER — Ambulatory Visit (INDEPENDENT_AMBULATORY_CARE_PROVIDER_SITE_OTHER): Payer: Medicaid Other | Admitting: Cardiology

## 2021-07-27 VITALS — BP 122/70 | HR 50 | Ht 73.0 in | Wt 251.2 lb

## 2021-07-27 DIAGNOSIS — I5022 Chronic systolic (congestive) heart failure: Secondary | ICD-10-CM | POA: Diagnosis not present

## 2021-07-27 DIAGNOSIS — I4819 Other persistent atrial fibrillation: Secondary | ICD-10-CM

## 2021-07-27 DIAGNOSIS — I4510 Unspecified right bundle-branch block: Secondary | ICD-10-CM

## 2021-07-27 NOTE — Patient Instructions (Addendum)
Medication Instructions:  Your physician recommends that you continue on your current medications as directed. Please refer to the Current Medication list given to you today. *If you need a refill on your cardiac medications before your next appointment, please call your pharmacy*  Lab Work: None ordered. If you have labs (blood work) drawn today and your tests are completely normal, you will receive your results only by: MyChart Message (if you have MyChart) OR A paper copy in the mail If you have any lab test that is abnormal or we need to change your treatment, we will call you to review the results.  Testing/Procedures: Your physician has recommended that you have a defibrillator inserted. An implantable cardioverter defibrillator (ICD) is a small device that is placed in your chest or, in rare cases, your abdomen. This device uses electrical pulses or shocks to help control life-threatening, irregular heartbeats that could lead the heart to suddenly stop beating (sudden cardiac arrest). Leads are attached to the ICD that goes into your heart. This is done in the hospital and usually requires an overnight stay. Please see the instruction sheet given to you today for more information.  Follow-Up: At [facility], you and your health needs are our priority.  As part of our continuing mission to provide you with exceptional heart care, we have created designated Provider Care Teams.  These Care Teams include your primary Cardiologist (physician) and Advanced Practice Providers (APPs -  Physician Assistants and Nurse Practitioners) who all work together to provide you with the care you need, when you need it.  Your next appointment:  Procedure date September 20, 2021  -  You will follow up with the [facility] Device clinic 10-14 days after your procedure. - You will follow up with Dr. Lalla Brothers 91 days after your procedure.

## 2021-08-05 ENCOUNTER — Other Ambulatory Visit: Payer: Self-pay

## 2021-08-05 DIAGNOSIS — I4819 Other persistent atrial fibrillation: Secondary | ICD-10-CM

## 2021-08-05 MED ORDER — DOFETILIDE 500 MCG PO CAPS: 500.0000 ug | ORAL_CAPSULE | Freq: Two times a day (BID) | ORAL | 0 refills | Status: DC

## 2021-08-08 ENCOUNTER — Other Ambulatory Visit: Payer: Self-pay | Admitting: Cardiology

## 2021-08-08 NOTE — Telephone Encounter (Signed)
*  STAT* If patient is at the pharmacy, call can be transferred to refill team.   1. Which medications need to be refilled? (please list name of each medication and dose if known) metoprolol succinate (TOPROL-XL) 50 MG 24 hr tablet  2. Which pharmacy/location (including street and city if local pharmacy) is medication to be sent to? [facility] - [place], [place] - [address]    3. Do they need a 30 day or 90 day supply? 30 ds

## 2021-08-08 NOTE — Telephone Encounter (Signed)
This is a [place] pt 

## 2021-08-09 MED ORDER — METOPROLOL SUCCINATE ER 50 MG PO TB24: 50.0000 mg | ORAL_TABLET | Freq: Every day | ORAL | 0 refills | Status: AC

## 2021-08-09 NOTE — Telephone Encounter (Signed)
Requested Prescriptions   Signed Prescriptions Disp Refills  . metoprolol succinate (TOPROL-XL) 50 MG 24 hr tablet 30 tablet 0    Sig: Take 1 tablet (50 mg total) by mouth daily. Take with or immediately following a meal.    Authorizing Provider: AGBOR-ETANG, BRIAN    Ordering User: Amana Bouska C    

## 2021-08-11 ENCOUNTER — Inpatient Hospital Stay
Admission: EM | Admit: 2021-08-11 | Discharge: 2021-08-13 | DRG: 291 | Discharge status: Against Medical Advice | Payer: Medicaid Other | Attending: Hospitalist | Admitting: Hospitalist

## 2021-08-11 ENCOUNTER — Other Ambulatory Visit: Payer: Self-pay

## 2021-08-11 ENCOUNTER — Emergency Department: Payer: Medicaid Other

## 2021-08-11 ENCOUNTER — Encounter: Payer: Self-pay | Admitting: Emergency Medicine

## 2021-08-11 DIAGNOSIS — I440 Atrioventricular block, first degree: Secondary | ICD-10-CM | POA: Diagnosis present

## 2021-08-11 DIAGNOSIS — Z6838 Body mass index (BMI) 38.0-38.9, adult: Secondary | ICD-10-CM

## 2021-08-11 DIAGNOSIS — R778 Other specified abnormalities of plasma proteins: Secondary | ICD-10-CM | POA: Diagnosis present

## 2021-08-11 DIAGNOSIS — Z951 Presence of aortocoronary bypass graft: Secondary | ICD-10-CM

## 2021-08-11 DIAGNOSIS — R001 Bradycardia, unspecified: Secondary | ICD-10-CM | POA: Diagnosis present

## 2021-08-11 DIAGNOSIS — I272 Pulmonary hypertension, unspecified: Secondary | ICD-10-CM | POA: Diagnosis present

## 2021-08-11 DIAGNOSIS — I252 Old myocardial infarction: Secondary | ICD-10-CM

## 2021-08-11 DIAGNOSIS — Z89422 Acquired absence of other left toe(s): Secondary | ICD-10-CM

## 2021-08-11 DIAGNOSIS — Z89411 Acquired absence of right great toe: Secondary | ICD-10-CM

## 2021-08-11 DIAGNOSIS — I959 Hypotension, unspecified: Secondary | ICD-10-CM | POA: Diagnosis present

## 2021-08-11 DIAGNOSIS — I25810 Atherosclerosis of coronary artery bypass graft(s) without angina pectoris: Secondary | ICD-10-CM | POA: Diagnosis present

## 2021-08-11 DIAGNOSIS — F419 Anxiety disorder, unspecified: Secondary | ICD-10-CM | POA: Diagnosis present

## 2021-08-11 DIAGNOSIS — I130 Hypertensive heart and chronic kidney disease with heart failure and stage 1 through stage 4 chronic kidney disease, or unspecified chronic kidney disease: Secondary | ICD-10-CM | POA: Diagnosis present

## 2021-08-11 DIAGNOSIS — E785 Hyperlipidemia, unspecified: Secondary | ICD-10-CM | POA: Diagnosis present

## 2021-08-11 DIAGNOSIS — I5023 Acute on chronic systolic (congestive) heart failure: Secondary | ICD-10-CM | POA: Diagnosis not present

## 2021-08-11 DIAGNOSIS — I700 Atherosclerosis of aorta: Secondary | ICD-10-CM | POA: Diagnosis present

## 2021-08-11 DIAGNOSIS — I509 Heart failure, unspecified: Secondary | ICD-10-CM

## 2021-08-11 DIAGNOSIS — I2510 Atherosclerotic heart disease of native coronary artery without angina pectoris: Secondary | ICD-10-CM | POA: Diagnosis present

## 2021-08-11 DIAGNOSIS — Z5329 Procedure and treatment not carried out because of patient's decision for other reasons: Secondary | ICD-10-CM | POA: Diagnosis not present

## 2021-08-11 DIAGNOSIS — I255 Ischemic cardiomyopathy: Secondary | ICD-10-CM | POA: Diagnosis present

## 2021-08-11 DIAGNOSIS — N1831 Chronic kidney disease, stage 3a: Secondary | ICD-10-CM | POA: Diagnosis present

## 2021-08-11 DIAGNOSIS — E1122 Type 2 diabetes mellitus with diabetic chronic kidney disease: Secondary | ICD-10-CM | POA: Diagnosis present

## 2021-08-11 DIAGNOSIS — I5043 Acute on chronic combined systolic (congestive) and diastolic (congestive) heart failure: Secondary | ICD-10-CM | POA: Diagnosis present

## 2021-08-11 DIAGNOSIS — E669 Obesity, unspecified: Secondary | ICD-10-CM | POA: Diagnosis present

## 2021-08-11 DIAGNOSIS — Z20822 Contact with and (suspected) exposure to covid-19: Secondary | ICD-10-CM | POA: Diagnosis present

## 2021-08-11 DIAGNOSIS — Z89421 Acquired absence of other right toe(s): Secondary | ICD-10-CM

## 2021-08-11 DIAGNOSIS — T502X5A Adverse effect of carbonic-anhydrase inhibitors, benzothiadiazides and other diuretics, initial encounter: Secondary | ICD-10-CM | POA: Diagnosis not present

## 2021-08-11 DIAGNOSIS — E119 Type 2 diabetes mellitus without complications: Secondary | ICD-10-CM

## 2021-08-11 DIAGNOSIS — Z794 Long term (current) use of insulin: Secondary | ICD-10-CM | POA: Diagnosis not present

## 2021-08-11 DIAGNOSIS — Z87891 Personal history of nicotine dependence: Secondary | ICD-10-CM

## 2021-08-11 DIAGNOSIS — I4510 Unspecified right bundle-branch block: Secondary | ICD-10-CM | POA: Diagnosis present

## 2021-08-11 DIAGNOSIS — J9601 Acute respiratory failure with hypoxia: Secondary | ICD-10-CM | POA: Diagnosis present

## 2021-08-11 DIAGNOSIS — Z8249 Family history of ischemic heart disease and other diseases of the circulatory system: Secondary | ICD-10-CM

## 2021-08-11 DIAGNOSIS — I480 Paroxysmal atrial fibrillation: Secondary | ICD-10-CM | POA: Diagnosis present

## 2021-08-11 DIAGNOSIS — Z7901 Long term (current) use of anticoagulants: Secondary | ICD-10-CM

## 2021-08-11 DIAGNOSIS — E876 Hypokalemia: Secondary | ICD-10-CM | POA: Diagnosis not present

## 2021-08-11 DIAGNOSIS — E1151 Type 2 diabetes mellitus with diabetic peripheral angiopathy without gangrene: Secondary | ICD-10-CM | POA: Diagnosis present

## 2021-08-11 DIAGNOSIS — Z79899 Other long term (current) drug therapy: Secondary | ICD-10-CM

## 2021-08-11 LAB — CBC
HCT: 38.5 % — ABNORMAL LOW (ref 39.0–52.0)
Hemoglobin: 13 g/dL (ref 13.0–17.0)
MCH: 31.7 pg (ref 26.0–34.0)
MCHC: 33.8 g/dL (ref 30.0–36.0)
MCV: 93.9 fL (ref 80.0–100.0)
Platelets: 254 10*3/uL (ref 150–400)
RBC: 4.1 MIL/uL — ABNORMAL LOW (ref 4.22–5.81)
RDW: 17.1 % — ABNORMAL HIGH (ref 11.5–15.5)
WBC: 12.9 10*3/uL — ABNORMAL HIGH (ref 4.0–10.5)
nRBC: 0 % (ref 0.0–0.2)

## 2021-08-11 LAB — BASIC METABOLIC PANEL
Anion gap: 10 (ref 5–15)
BUN: 23 mg/dL (ref 8–23)
CO2: 29 mmol/L (ref 22–32)
Calcium: 9.4 mg/dL (ref 8.9–10.3)
Chloride: 96 mmol/L — ABNORMAL LOW (ref 98–111)
Creatinine, Ser: 1.18 mg/dL (ref 0.61–1.24)
GFR, Estimated: >60 mL/min (ref >60)
Glucose, Bld: 167 mg/dL — ABNORMAL HIGH (ref 70–99)
Potassium: 3.1 mmol/L — ABNORMAL LOW (ref 3.5–5.1)
Sodium: 135 mmol/L (ref 135–145)

## 2021-08-11 LAB — MAGNESIUM: Magnesium: 2.2 mg/dL (ref 1.7–2.4)

## 2021-08-11 LAB — CBG MONITORING, ED
Glucose-Capillary: 109 mg/dL — ABNORMAL HIGH (ref 70–99)
Glucose-Capillary: 189 mg/dL — ABNORMAL HIGH (ref 70–99)

## 2021-08-11 LAB — RESP PANEL BY RT-PCR (FLU A&B, COVID) ARPGX2
Influenza A by PCR: NEGATIVE
Influenza B by PCR: NEGATIVE
SARS Coronavirus 2 by RT PCR: NEGATIVE

## 2021-08-11 LAB — TROPONIN I (HIGH SENSITIVITY)
Troponin I (High Sensitivity): 35 ng/L — ABNORMAL HIGH (ref <18)
Troponin I (High Sensitivity): 48 ng/L — ABNORMAL HIGH (ref <18)
Troponin I (High Sensitivity): 58 ng/L — ABNORMAL HIGH (ref <18)
Troponin I (High Sensitivity): 54 ng/L — ABNORMAL HIGH (ref <18)

## 2021-08-11 LAB — GLUCOSE, CAPILLARY: Glucose-Capillary: 133 mg/dL — ABNORMAL HIGH (ref 70–99)

## 2021-08-11 LAB — BRAIN NATRIURETIC PEPTIDE: B Natriuretic Peptide: 2855.4 pg/mL — ABNORMAL HIGH (ref 0.0–100.0)

## 2021-08-11 MED ORDER — POTASSIUM CHLORIDE CRYS ER 20 MEQ PO TBCR
20.0000 meq | EXTENDED_RELEASE_TABLET | Freq: Every day | ORAL | Status: DC
  Administered 2021-08-11 – 2021-08-12 (×2): 20 meq via ORAL
  Filled 2021-08-11 (×2): qty 1

## 2021-08-11 MED ORDER — DAPAGLIFLOZIN PROPANEDIOL 10 MG PO TABS
10.0000 mg | ORAL_TABLET | Freq: Every day | ORAL | Status: DC
  Administered 2021-08-12 – 2021-08-13 (×2): 10 mg via ORAL
  Filled 2021-08-11 (×2): qty 1

## 2021-08-11 MED ORDER — APIXABAN 5 MG PO TABS
5.0000 mg | ORAL_TABLET | Freq: Two times a day (BID) | ORAL | Status: DC
  Administered 2021-08-11 – 2021-08-13 (×4): 5 mg via ORAL
  Filled 2021-08-11 (×3): qty 1

## 2021-08-11 MED ORDER — ONDANSETRON HCL 4 MG PO TABS: 4.0000 mg | ORAL_TABLET | Freq: Four times a day (QID) | ORAL | Status: DC | PRN

## 2021-08-11 MED ORDER — FUROSEMIDE 10 MG/ML IJ SOLN
80.0000 mg | Freq: Once | INTRAMUSCULAR | Status: AC
  Administered 2021-08-11: 80 mg via INTRAVENOUS
  Filled 2021-08-11: qty 8

## 2021-08-11 MED ORDER — ATORVASTATIN CALCIUM 80 MG PO TABS
80.0000 mg | ORAL_TABLET | Freq: Every evening | ORAL | Status: DC
  Administered 2021-08-11 – 2021-08-12 (×2): 80 mg via ORAL
  Filled 2021-08-11: qty 4
  Filled 2021-08-11: qty 1

## 2021-08-11 MED ORDER — INSULIN ASPART 100 UNIT/ML IJ SOLN
0.0000 [IU] | Freq: Three times a day (TID) | INTRAMUSCULAR | Status: DC
  Administered 2021-08-11 – 2021-08-12 (×2): 3 [IU] via SUBCUTANEOUS
  Administered 2021-08-12 (×2): 2 [IU] via SUBCUTANEOUS
  Administered 2021-08-13: 3 [IU] via SUBCUTANEOUS
  Filled 2021-08-11 (×4): qty 1

## 2021-08-11 MED ORDER — SACUBITRIL-VALSARTAN 97-103 MG PO TABS
1.0000 | ORAL_TABLET | Freq: Two times a day (BID) | ORAL | Status: DC
  Administered 2021-08-11 – 2021-08-13 (×4): 1 via ORAL
  Filled 2021-08-11 (×6): qty 1

## 2021-08-11 MED ORDER — SODIUM CHLORIDE 0.9 % IV SOLN: 250.0000 mL | INTRAVENOUS | Status: DC | PRN

## 2021-08-11 MED ORDER — DOFETILIDE 250 MCG PO CAPS
500.0000 ug | ORAL_CAPSULE | Freq: Two times a day (BID) | ORAL | Status: DC
  Administered 2021-08-11 – 2021-08-13 (×4): 500 ug via ORAL
  Filled 2021-08-11 (×8): qty 2

## 2021-08-11 MED ORDER — SODIUM CHLORIDE 0.9% FLUSH: 3.0000 mL | INTRAVENOUS | Status: DC | PRN

## 2021-08-11 MED ORDER — SODIUM CHLORIDE 0.9% FLUSH
3.0000 mL | Freq: Two times a day (BID) | INTRAVENOUS | Status: DC
  Administered 2021-08-11 – 2021-08-13 (×5): 3 mL via INTRAVENOUS

## 2021-08-11 MED ORDER — ACETAMINOPHEN 500 MG PO TABS
1000.0000 mg | ORAL_TABLET | Freq: Four times a day (QID) | ORAL | Status: DC | PRN
  Administered 2021-08-13: 1000 mg via ORAL
  Filled 2021-08-11: qty 2

## 2021-08-11 MED ORDER — CALCIUM CARBONATE ANTACID 500 MG PO CHEW: 500.0000 mg | CHEWABLE_TABLET | Freq: Every day | ORAL | Status: DC | PRN

## 2021-08-11 MED ORDER — FUROSEMIDE 10 MG/ML IJ SOLN
40.0000 mg | Freq: Two times a day (BID) | INTRAMUSCULAR | Status: DC
  Administered 2021-08-11 – 2021-08-13 (×4): 40 mg via INTRAVENOUS
  Filled 2021-08-11 (×4): qty 4

## 2021-08-11 MED ORDER — NITROGLYCERIN 0.4 MG SL SUBL
0.4000 mg | SUBLINGUAL_TABLET | SUBLINGUAL | Status: DC | PRN
  Administered 2021-08-13: 0.4 mg via SUBLINGUAL
  Filled 2021-08-11: qty 1

## 2021-08-11 MED ORDER — ONDANSETRON HCL 4 MG/2ML IJ SOLN: 4.0000 mg | Freq: Four times a day (QID) | INTRAMUSCULAR | Status: DC | PRN

## 2021-08-11 MED ORDER — ISOSORB DINITRATE-HYDRALAZINE 20-37.5 MG PO TABS
1.0000 | ORAL_TABLET | Freq: Three times a day (TID) | ORAL | Status: DC
Start: 2021-08-11 — End: 2021-08-13
  Administered 2021-08-11 – 2021-08-13 (×5): 1 via ORAL
  Filled 2021-08-11 (×8): qty 1

## 2021-08-11 MED ORDER — POTASSIUM CHLORIDE 20 MEQ PO PACK
60.0000 meq | PACK | Freq: Once | ORAL | Status: AC
  Administered 2021-08-11: 60 meq via ORAL
  Filled 2021-08-11: qty 3

## 2021-08-11 MED ORDER — DULOXETINE HCL 30 MG PO CPEP
30.0000 mg | ORAL_CAPSULE | Freq: Every day | ORAL | Status: DC
  Administered 2021-08-12 – 2021-08-13 (×2): 30 mg via ORAL
  Filled 2021-08-11 (×3): qty 1

## 2021-08-11 MED ORDER — DIAZEPAM 5 MG PO TABS
5.0000 mg | ORAL_TABLET | Freq: Three times a day (TID) | ORAL | Status: DC | PRN
  Administered 2021-08-11 – 2021-08-13 (×6): 5 mg via ORAL
  Filled 2021-08-11 (×6): qty 1

## 2021-08-11 NOTE — ED Provider Notes (Signed)
[facility] Emergency Department Provider Note   ____________________________________________   Event Date/Time   First MD Initiated Contact with Patient 08/11/21 [phone]     (approximate)  I have reviewed the triage vital signs and the nursing notes.   HISTORY  Chief Complaint Shortness of Breath    HPI Marc [name] is a 60 y.o. male with a stated past medical history of CHF who presents for worsening shortness of breath  LOCATION: Chest DURATION: 1 week prior to arrival TIMING: Worsening SEVERITY: Severe QUALITY: Shortness of breath CONTEXT: Patient states he has a history of CHF and has had worsening shortness of breath over the last week despite taking his medications on time and as prescribed MODIFYING FACTORS: Exertion worsens the shortness of breath and is partially relieved at rest and with supplemental oxygen ASSOCIATED SYMPTOMS: Dyspnea on exertion   Per medical record review, patient has history of chronic systolic heart failure          Past Medical History:  Diagnosis Date   Anxiety    Chronic systolic heart failure ([place])    a. 07/2013 Echo: Ejection fraction of 45-50% with possible inferior wall hypokinesis; b. 03/2018 Echo: EF 40-45%; c. 06/2020 Echo: EF 40-45%, glob HK. Mod LVH, gr1 DD. Nl PASP. Mild BAE. Triv MR. Mild AoV sclerosis. Mild Ao dil (24mm).   Coronary artery disease    Significant left main and Severe three-vessel coronary artery disease in December of 2014. He underwent CABG in January of 2015 with LIMA to LAD, sequential SVG to ramus/OM 2 and SVG to OM 3   Diabetes mellitus without complication ([place])    Hyperlipidemia    Hypertension    Ischemic cardiomyopathy    a. 07/2013 Echo: EF 45-50%; b. 03/2018 Echo: EF 40-45%; c. 06/2020 Echo: EF 40-45%.   Medical non-compliance    MI (myocardial infarction) ([place])    Obesity    PAD (peripheral artery disease) ([place])    a. 04/2020 PTA of L PT.   PAF (paroxysmal atrial  fibrillation) ([place])    Tobacco abuse     Patient Active Problem List   Diagnosis Date Noted   Acute respiratory failure with hypoxia ([place]) 05/13/2021   Elevated lactic acid level 05/13/2021   Leukocytosis 05/13/2021   Secondary hypercoagulable state ([place]) 03/15/2021   CHF, acute on chronic (HCC) 03/01/2021   Anxiety    Hypokalemia    NSTEMI (non-ST elevated myocardial infarction) ([place])    Type II diabetes mellitus with renal manifestations ([place]) 01/06/2021   Tobacco abuse 01/06/2021   CKD (chronic kidney disease), stage IIIa 01/06/2021   Hyponatremia 01/06/2021   Elevated troponin 01/06/2021   Nausea & vomiting 01/06/2021   Generalized weakness    Hyperkalemia 11/20/2020   Cellulitis and abscess of toe of left foot 04/05/2020   Diabetes ([place]) 03/14/2018   Blurred vision 03/13/2018   Cellulitis of toe, right 02/29/2016   Atherosclerosis of native arteries of the extremities with ulceration ([place]) 10/21/2014   Acute ischemic colitis ([place]) 09/19/2014   Aortic aneurysm with dissection ([place]) 09/19/2014   Type 2 diabetes mellitus without complication ([place]) [id number]   Encounter for therapeutic drug monitoring 09/26/2013   CAD (coronary artery disease) 09/16/2013   Chronic coronary artery disease 09/16/2013   Coronary artery disease    PAF (paroxysmal atrial fibrillation) ([place]) 07/29/2013   Atrial fibrillation ([place]) 07/29/2013   Hypertension    Hyperlipidemia    Acute on chronic systolic CHF (congestive heart failure) ([place]) 07/05/2013  Past Surgical History:  Procedure Laterality Date   AMPUTATION Left 04/08/2020   Procedure: AMPUTATION RAY;  Surgeon: Caroline More, DPM;  Location: [facility];  Service: Podiatry;  Laterality: Left;   AMPUTATION Left 06/25/2020   Procedure: AMPUTATION RAY 1ST RAY PARTIAL LEFT;  Surgeon: Caroline More, DPM;  Location: [facility];  Service: Podiatry;  Laterality: Left;   AMPUTATION TOE Right 02/29/2016   Procedure: AMPUTATION TOE;  Surgeon: Sharlotte Alamo, DPM;  Location: [facility];  Service: Podiatry;  Laterality: Right;  Great toe   CARDIAC CATHETERIZATION     [facility] no stent   CARDIOVERSION N/A 02/23/2021   Procedure: CARDIOVERSION;  Surgeon: Kate Sable, MD;  Location: [facility];  Service: Cardiovascular;  Laterality: N/A;   CORONARY ARTERY BYPASS GRAFT N/A 09/16/2013   Procedure: CORONARY ARTERY BYPASS GRAFTING (CABG);  Surgeon: Gaye Pollack, MD;  Location: [place];  Service: Open Heart Surgery;  Laterality: N/A;  CABG x four, using left internal mammary artery and right leg greater saphenous vein   INTRAOPERATIVE TRANSESOPHAGEAL ECHOCARDIOGRAM N/A 09/16/2013   Procedure: INTRAOPERATIVE TRANSESOPHAGEAL ECHOCARDIOGRAM;  Surgeon: Gaye Pollack, MD;  Location: [place] [facility];  Service: Open Heart Surgery;  Laterality: N/A;   knee surgery Right    LASIK Bilateral    LOWER EXTREMITY ANGIOGRAPHY Left 04/07/2020   Procedure: Lower Extremity Angiography;  Surgeon: Katha Cabal, MD;  Location: [place] [facility];  Service: Cardiovascular;  Laterality: Left;   LOWER EXTREMITY ANGIOGRAPHY Left 04/09/2020   Procedure: Lower Extremity Angiography (Pedal Approach);  Surgeon: Katha Cabal, MD;  Location: [place] [facility];  Service: Cardiovascular;  Laterality: Left;   RIGHT/LEFT HEART CATH AND CORONARY/GRAFT ANGIOGRAPHY N/A 03/04/2021   Procedure: RIGHT/LEFT HEART CATH AND CORONARY/GRAFT ANGIOGRAPHY;  Surgeon: Wellington Hampshire, MD;  Location: [place] [facility];  Service: Cardiovascular;  Laterality: N/A;    Prior to Admission medications   Medication Sig Start Date End Date Taking? Authorizing Provider  acetaminophen (TYLENOL) 500 MG tablet Take 1,000 mg by mouth every 6 (six) hours as needed for moderate pain or headache.    [provider]  apixaban (ELIQUIS) 5 MG TABS tablet Take 1 tablet (5 mg total) by mouth 2 (two) times daily. 05/06/21   Kate Sable, MD  atorvastatin (LIPITOR) 80 MG tablet Take 80 mg by mouth  every evening.  01/31/18   [provider]  calcium carbonate (TUMS - DOSED IN MG ELEMENTAL CALCIUM) 500 MG chewable tablet Chew 500 mg by mouth daily as needed for indigestion or heartburn.    [provider]  dapagliflozin propanediol (FARXIGA) 10 MG TABS tablet Take 1 tablet (10 mg total) by mouth daily. 03/08/21   Sharen Hones, MD  diazepam (VALIUM) 5 MG tablet Take 5 mg by mouth every 8 (eight) hours as needed (severe anxiety).    [provider]  dofetilide (TIKOSYN) 500 MCG capsule Take 1 capsule (500 mcg total) by mouth 2 (two) times daily. It is important you do not miss any doses of this medication. Call the office if you miss 3 doses. Make sure you notify the office with plenty of time for any refills. 08/05/21   Kate Sable, MD  insulin glargine (LANTUS SOLOSTAR) 100 UNIT/ML Solostar Pen Inject 42 Units into the skin daily. Patient taking differently: Inject 50 Units into the skin daily. 01/07/21   Rai, Ripudeep K, MD  isosorbide-hydrALAZINE (BIDIL) 20-37.5 MG tablet Take 1 tablet by mouth 3 (three) times daily.    [provider]  metoprolol succinate (TOPROL-XL) 50 MG 24 hr tablet Take 1 tablet (50 mg total) by mouth daily. Take with or immediately following a meal. 08/09/21   Agbor-Etang, Arlys John, MD  Naphazoline-Pheniramine (VISINE-A OP) Place 1 drop into both eyes daily as needed (allergies).    [provider]  nitroGLYCERIN (NITROSTAT) 0.4 MG SL tablet Place 1 tablet (0.4 mg total) under the tongue every 5 (five) minutes x 3 doses as needed for chest pain. 05/15/21   Arnetha Courser, MD  potassium chloride (KLOR-CON) 20 MEQ tablet Take 1 tablet (20 mEq total) by mouth daily. Take 1 tab by mouth twice a day for 5 days (05/31/21-06/04/21), and then take 1 tab by mouth once a day. 05/31/21   Debbe Odea, MD  sacubitril-valsartan (ENTRESTO) 97-103 MG Take 1 tablet by mouth 2 (two) times daily.    [provider]  torsemide (DEMADEX) 20  MG tablet Take 40 mg by mouth daily.    [provider]    Allergies Spironolactone  Family History  Problem Relation Age of Onset   Heart disease Mother     Social History Social History   Tobacco Use   Smoking status: Former    Packs/day: 0.50    Years: 45.00    Pack years: 22.50    Types: Cigarettes    Quit date: 02/09/2021    Years since quitting: 0.5   Smokeless tobacco: Never  Vaping Use   Vaping Use: Never used  Substance Use Topics   Alcohol use: No    Alcohol/week: 0.0 standard drinks   Drug use: No    Review of Systems Constitutional: No fever/chills Eyes: No visual changes. ENT: No sore throat. Cardiovascular: Denies chest pain. Respiratory: Endorses shortness of breath. Gastrointestinal: No abdominal pain.  No nausea, no vomiting.  No diarrhea. Genitourinary: Negative for dysuria. Musculoskeletal: Negative for acute arthralgias Skin: Negative for rash. Neurological: Negative for headaches, weakness/numbness/paresthesias in any extremity Psychiatric: Negative for suicidal ideation/homicidal ideation   ____________________________________________   PHYSICAL EXAM:  VITAL SIGNS: ED Triage Vitals  Enc Vitals Group     BP 08/11/21 0641 123/66     Pulse Rate 08/11/21 0641 72     Resp 08/11/21 0641 (!) 22     Temp 08/11/21 0641 (!) 97.5 F (36.4 C)     Temp Source 08/11/21 0641 Oral     SpO2 08/11/21 0641 98 %     Weight 08/11/21 0640 245 lb (111.1 kg)     Height 08/11/21 0640 6' (1.829 m)     Head Circumference --      Peak Flow --      Pain Score 08/11/21 0640 0     Pain Loc --      Pain Edu? --      Excl. in GC? --    Constitutional: Alert and oriented. Well appearing and in no acute distress. Eyes: Conjunctivae are normal. PERRL. Head: Atraumatic. Nose: No congestion/rhinnorhea. Mouth/Throat: Mucous membranes are moist. Neck: No stridor Cardiovascular: Grossly normal heart sounds.  Good peripheral circulation.  Bilateral lower  extremity edema Respiratory: Mildly increased respiratory effort.  Rales over bilateral lower lung fields.  No retractions. Gastrointestinal: Soft and nontender. No distention. Musculoskeletal: No obvious deformities Neurologic:  Normal speech and language. No gross focal neurologic deficits are appreciated. Skin:  Skin is warm and dry. No rash noted. Psychiatric: Mood and affect are normal. Speech and behavior are normal.  ____________________________________________   LABS (all labs ordered are listed, but  only abnormal results are displayed)  Labs Reviewed  BASIC METABOLIC PANEL - Abnormal; Notable for the following components:      Result Value   Potassium 3.1 (*)    Chloride 96 (*)    Glucose, Bld 167 (*)    All other components within normal limits  CBC - Abnormal; Notable for the following components:   WBC 12.9 (*)    RBC 4.10 (*)    HCT 38.5 (*)    RDW 17.1 (*)    All other components within normal limits  BRAIN NATRIURETIC PEPTIDE - Abnormal; Notable for the following components:   B Natriuretic Peptide 2,855.4 (*)    All other components within normal limits  TROPONIN I (HIGH SENSITIVITY) - Abnormal; Notable for the following components:   Troponin I (High Sensitivity) 35 (*)    All other components within normal limits  RESP PANEL BY RT-PCR (FLU A&B, COVID) ARPGX2  TROPONIN I (HIGH SENSITIVITY)   ____________________________________________  EKG  ED ECG REPORT I, Naaman Plummer, the attending physician, personally viewed and interpreted this ECG.  Date: 08/11/2021 EKG Time: 640 Rate: 62 Rhythm: normal sinus rhythm QRS Axis: normal Intervals: First-degree AV block.  RBBB ST/T Wave abnormalities: normal Narrative Interpretation: First-degree AV block, RBBB.  No evidence of acute ischemia ____________________________________________  RADIOLOGY  ED MD interpretation: 2 view chest x-ray shows cardiomegaly with bilateral pleural effusions and associated  vascular congestion consistent with CHF  Official radiology report(s): DG Chest 2 View  Result Date: 08/11/2021 CLINICAL DATA:  60 year old male with shortness of breath, progressive for 1 week. EXAM: CHEST - 2 VIEW COMPARISON:  Portable chest 05/13/2021 and earlier. FINDINGS: Seated AP and lateral views. Prior sternotomy. Stable mild to moderate cardiomegaly. Other mediastinal contours are within normal limits. Visualized tracheal air column is within normal limits. Lung volumes remain within normal limits. Evidence of small bilateral layering pleural effusions and increased pulmonary vascularity diffusely. Still, vascular congestion is regressed compared to September. No pneumothorax or consolidation. No acute osseous abnormality identified. Abdominal Calcified aortic atherosclerosis. Negative visible bowel gas pattern. IMPRESSION: 1. Chronic cardiomegaly with small bilateral pleural effusions. Vascular congestion without overt edema. 2.  Aortic Atherosclerosis (ICD10-I70.0). Electronically Signed   By: Genevie Ann M.D.   On: 08/11/2021 07:44    ____________________________________________   PROCEDURES  Procedure(s) performed (including Critical Care):  .1-3 Lead EKG Interpretation Performed by: Naaman Plummer, MD Authorized by: Naaman Plummer, MD     Interpretation: abnormal     ECG rate:  68   ECG rate assessment: normal     Rhythm: A-V block     Ectopy: none     Conduction: abnormal     Abnormal conduction: 1st degree AV block   Comments:     RBBB  CRITICAL CARE Performed by: Naaman Plummer   Total critical care time: 31 minutes  Critical care time was exclusive of separately billable procedures and treating other patients.  Critical care was necessary to treat or prevent imminent or life-threatening deterioration.  Critical care was time spent personally by me on the following activities: development of treatment plan with patient and/or surrogate as well as nursing,  discussions with consultants, evaluation of patient's response to treatment, examination of patient, obtaining history from patient or surrogate, ordering and performing treatments and interventions, ordering and review of laboratory studies, ordering and review of radiographic studies, pulse oximetry and re-evaluation of patient's condition.  ____________________________________________   INITIAL IMPRESSION / ASSESSMENT AND PLAN /  ED COURSE  As part of my medical decision making, I reviewed the following data within the electronic medical record, if available:  Nursing notes reviewed and incorporated, Labs reviewed, EKG interpreted, Old chart reviewed, Radiograph reviewed and Notes from prior ED visits reviewed and incorporated        Endorses dyspnea endorses LE edema Denies Non adherence to medication regimen  Workup: ECG, CBC, BMP, Troponin, BNP, CXR Findings: EKG: No STEMI and no evidence of Brugadas sign, delta wave, epsilon wave, significantly prolonged QTc, or malignant arrhythmia. BNP: 2855 CXR: Cardiomegaly, bilateral pleural effusions, vascular congestion Based on history, exam and findings, presentation most consistent with acute on chronic heart failure. Low suspicion for PNA, ACS, tamponade, aortic dissection. Interventions: Oxygen, Diuresis  Disposition (Stable but not significantly improved): Admit to medicine for further monitoring and for improvement of medication regimen to control symptoms.      ____________________________________________   FINAL CLINICAL IMPRESSION(S) / ED DIAGNOSES  Final diagnoses:  Acute on chronic congestive heart failure, unspecified heart failure type ([place])  Acute respiratory failure with hypoxia [facility])     ED Discharge Orders     None        Note:  This document was prepared using Dragon voice recognition software and may include unintentional dictation errors.    Naaman Plummer, MD 08/11/21 [phone]

## 2021-08-11 NOTE — ED Notes (Signed)
Pt states he gets anxious when he is SOB, pt states he is feeling anxious at present. Pt given verbal reassurance and po meds.

## 2021-08-11 NOTE — ED Triage Notes (Signed)
Pt to ED via [facility] from home c/o SOB that has been worsening over the last week.  Denies new pain, cough, or n/v/d.  States hx of anxiety and felt very panicked when he couldn't breathe.  Found to be 90% RA with EMS and up to 96% on 2L [place].  Pt A&Ox4, labored breaths, short choppy sentences, skin WNL.

## 2021-08-11 NOTE — Progress Notes (Signed)
Patient received to ED room 33. Patient aox4 and in no distress. On 2L [place]. Patient on bedside monitor. Patient assisted with repositioning on stretcher. No other needs at this time.

## 2021-08-11 NOTE — H&P (Addendum)
History and Physical    Marc [name] [id number] DOB: 1960-12-03 DOA: 08/11/2021  PCP: [facility]   Patient coming from: Home  I have personally briefly reviewed patient's old medical records in [place]  Chief Complaint: Shortness of breath  HPI: Marc [name] is a 60 y.o. male with medical history significant for atrial fibrillation status post DC cardioversion, history of chronic systolic CHF last known LVEF of 35 to 40%, history of coronary artery disease status post CABG x4, hypertension, diabetes mellitus and dyslipidemia who presents to the emergency room for evaluation of worsening shortness of breath from his baseline associated with orthopnea and PND. Patient states that he was had progressive shortness of breath over the last 1 week and over the last 2 days has had orthopnea and PND.  He has not had any sleep in the last 2 days due to difficulty breathing.  He admits to increased fluid intake because his medications make his mouth very dry but denies any dietary indiscretion and states that he is compliant with prescribed medications. When EMS arrived patient was found to have room air pulse oximetry of 90% and was placed on 2 L with improvement in his pulse oximetry to 96%.  He has conversational dyspnea. He denies having any chest pain, no nausea, no vomiting, no palpitations, no diaphoresis, no headache, no blurred vision, no fever, no chills, no cough, no urinary symptoms, no dizziness or lightheadedness. Sodium 135, potassium 3.1, chloride 96, bicarb 29, glucose 167, BUN 23, creatinine 1.18, calcium 9.4, BNP 2855, troponin 35 >> 48, white count 12.9, hemoglobin 13.0, hematocrit 38.5, MCV 93.9, RDW 17.1, platelet count 254 Respiratory viral panel is pending Chest x-ray reviewed by me shows chronic cardiomegaly with small bilateral pleural effusions.  Vascular congestion Twelve-lead EKG shows sinus rhythm with first-degree AV block.  Right bundle  branch block.   ED Course: Patient is a 60 year old male with a known history of chronic systolic heart failure with last known LVEF of 35 to 40% who presents to the ER for evaluation of worsening shortness of breath from his baseline associated with orthopnea and PND. Chest x-ray shows bilateral pleural effusions and BNP is elevated. Patient received a dose of Lasix and will be admitted to the hospital for further evaluation.    Review of Systems: As per HPI otherwise all other systems reviewed and negative.    Past Medical History:  Diagnosis Date   Anxiety    Chronic systolic heart failure ([place])    a. 07/2013 Echo: Ejection fraction of 45-50% with possible inferior wall hypokinesis; b. 03/2018 Echo: EF 40-45%; c. 06/2020 Echo: EF 40-45%, glob HK. Mod LVH, gr1 DD. Nl PASP. Mild BAE. Triv MR. Mild AoV sclerosis. Mild Ao dil (57mm).   Coronary artery disease    Significant left main and Severe three-vessel coronary artery disease in December of 2014. He underwent CABG in January of 2015 with LIMA to LAD, sequential SVG to ramus/OM 2 and SVG to OM 3   Diabetes mellitus without complication ([place])    Hyperlipidemia    Hypertension    Ischemic cardiomyopathy    a. 07/2013 Echo: EF 45-50%; b. 03/2018 Echo: EF 40-45%; c. 06/2020 Echo: EF 40-45%.   Medical non-compliance    MI (myocardial infarction) ([place])    Obesity    PAD (peripheral artery disease) ([place])    a. 04/2020 PTA of L PT.   PAF (paroxysmal atrial fibrillation) ([facility])    Tobacco abuse  Past Surgical History:  Procedure Laterality Date   AMPUTATION Left 04/08/2020   Procedure: AMPUTATION RAY;  Surgeon: Rosetta Posner, DPM;  Location: [facility] ORS;  Service: Podiatry;  Laterality: Left;   AMPUTATION Left 06/25/2020   Procedure: AMPUTATION RAY 1ST RAY PARTIAL LEFT;  Surgeon: Rosetta Posner, DPM;  Location: [facility] OR[facility];  Service: Podiatry;  Laterality: Left;   AMPUTATION TOE Right 02/29/2016   Procedure: AMPUTATION TOE;  Surgeon: Linus Galas, DPM;  Location: [facility] ORS;  Service: Podiatry;  Laterality: Right;  Great toe   CARDIAC CATHETERIZATION     [facility] no stent   CARDIOVERSION N/A 02/23/2021   Procedure: CARDIOVERSION;  Surgeon: Debbe Odea, MD;  Location: [facility] ORS;  Service: Cardiovascular;  Laterality: N/A;   CORONARY ARTERY BYPASS GRAFT N/A 09/16/2013   Procedure: CORONARY ARTERY BYPASS GRAFTING (CABG);  Surgeon: [name], MD;  [facility];  Service: Open Heart Surgery;  Laterality: N/A;  CABG x four, using left internal mammary artery and right leg greater saphenous vein   INTRAOPERATIVE TRANSESOPHAGEAL ECHOCARDIOGRAM N/A 09/16/2013   Procedure: INTRAOPERATIVE TRANSESOPHAGEAL ECHOCARDIOGRAM;  Surgeon: [name], MD;  Location: [facility] OR;  Service: Open Heart Surgery;  Laterality: N/A;   knee surgery Right    LASIK Bilateral    LOWER EXTREMITY ANGIOGRAPHY Left 04/07/2020   Procedure: Lower Extremity Angiography;  Surgeon: Renford Dills, MD;  Location: [facility] IN[facility];  Service: Cardiovascular;  Laterality: Left;   LOWER EXTREMITY ANGIOGRAPHY Left 04/09/2020   Procedure: Lower Extremity Angiography (Pedal Approach);  Surgeon: Renford Dills, MD;  Location: [facility];  Service: Cardiovascular;  Laterality: Left;   RIGHT/LEFT HEART CATH AND CORONARY/GRAFT ANGIOGRAPHY N/A 03/04/2021   Procedure: RIGHT/LEFT HEART CATH AND CORONARY/GRAFT ANGIOGRAPHY;  Surgeon: Iran Ouch, MD;  Location: [facility];  Service: Cardiovascular;  Laterality: N/A;     reports that he quit smoking about 6 months ago. His smoking use included cigarettes. He has a 22.50 pack-year smoking history. He has never used smokeless tobacco. He reports that he does not drink alcohol and does not use drugs.  Allergies  Allergen Reactions   Spironolactone Other (See Comments)    HYPERKALEMIA    Family History  Problem Relation Age of Onset   Heart disease Mother       Prior to Admission medications    Medication Sig Start Date End Date Taking? Authorizing Provider  apixaban (ELIQUIS) 5 MG TABS tablet Take 1 tablet (5 mg total) by mouth 2 (two) times daily. 05/06/21  Yes Debbe Odea, MD  atorvastatin (LIPITOR) 80 MG tablet Take 80 mg by mouth every evening.  01/31/18  Yes [provider]  calcium carbonate (TUMS - DOSED IN MG ELEMENTAL CALCIUM) 500 MG chewable tablet Chew 500 mg by mouth daily as needed for indigestion or heartburn.   Yes [provider]  dapagliflozin propanediol (FARXIGA) 10 MG TABS tablet Take 1 tablet (10 mg total) by mouth daily. 03/08/21  Yes Marrion Coy, MD  diazepam (VALIUM) 5 MG tablet Take 5 mg by mouth every 8 (eight) hours as needed (severe anxiety).   Yes [provider]  dofetilide (TIKOSYN) 500 MCG capsule Take 1 capsule (500 mcg total) by mouth 2 (two) times daily. It is important you do not miss any doses of this medication. Call the office if you miss 3 doses. Make sure you notify the office with plenty of time for any refills. 08/05/21  Yes Debbe Odea, MD  DULoxetine (  CYMBALTA) 30 MG capsule Take 30 mg by mouth daily. 08/10/21  Yes [provider]  insulin glargine (LANTUS SOLOSTAR) 100 UNIT/ML Solostar Pen Inject 42 Units into the skin daily. Patient taking differently: Inject 50 Units into the skin daily. 01/07/21  Yes Rai, Ripudeep K, MD  isosorbide-hydrALAZINE (BIDIL) 20-37.5 MG tablet Take 1 tablet by mouth 3 (three) times daily.   Yes [provider]  metoprolol succinate (TOPROL-XL) 50 MG 24 hr tablet Take 1 tablet (50 mg total) by mouth daily. Take with or immediately following a meal. 08/09/21  Yes Agbor-Etang, Aaron Edelman, MD  Naphazoline-Pheniramine (VISINE-A OP) Place 1 drop into both eyes daily as needed (allergies).   Yes [provider]  nitroGLYCERIN (NITROSTAT) 0.4 MG SL tablet Place 1 tablet (0.4 mg total) under the tongue every 5 (five) minutes x 3 doses as needed for chest pain. 05/15/21   Yes Lorella Nimrod, MD  potassium chloride (KLOR-CON) 20 MEQ tablet Take 1 tablet (20 mEq total) by mouth daily. Take 1 tab by mouth twice a day for 5 days (05/31/21-06/04/21), and then take 1 tab by mouth once a day. 05/31/21  Yes Agbor-Etang, Aaron Edelman, MD  torsemide (DEMADEX) 20 MG tablet Take 40 mg by mouth daily.   Yes [provider]  acetaminophen (TYLENOL) 500 MG tablet Take 1,000 mg by mouth every 6 (six) hours as needed for moderate pain or headache.    [provider]  sacubitril-valsartan (ENTRESTO) 97-103 MG Take 1 tablet by mouth 2 (two) times daily.    [provider]    Physical Exam: Vitals:   08/11/21 0757 08/11/21 0800 08/11/21 0930 08/11/21 1000  BP:  113/67 116/71 123/82  Pulse: 68 (!) 53 (!) 46 99  Resp: 17 (!) 24 (!) 21 12  Temp:      TempSrc:      SpO2: 95% 95% 93% 95%  Weight:      Height:         Vitals:   08/11/21 0757 08/11/21 0800 08/11/21 0930 08/11/21 1000  BP:  113/67 116/71 123/82  Pulse: 68 (!) 53 (!) 46 99  Resp: 17 (!) 24 (!) 21 12  Temp:      TempSrc:      SpO2: 95% 95% 93% 95%  Weight:      Height:          Constitutional: Alert and oriented x 3 .  Chronically ill-appearing .  Has conversational dyspnea. HEENT:      Head: Normocephalic and atraumatic.         Eyes: PERLA, EOMI, Conjunctivae are normal. Sclera is non-icteric.       Mouth/Throat: Mucous membranes are moist.       Neck: Supple with no signs of meningismus. Cardiovascular: Regular rate and rhythm. No murmurs, gallops, or rubs. 2+ symmetrical distal pulses are present . No JVD. 1+ LE edema Respiratory: Tachypnea.crackles in both lung bases bilaterally. No wheezes or rhonchi.  Gastrointestinal: Soft, non tender, and non distended with positive bowel sounds.  Central adiposity Genitourinary: No CVA tenderness. Musculoskeletal: Nontender with normal range of motion in all extremities. No cyanosis, or erythema of extremities. Neurologic:  Face is symmetric.  Moving all extremities. No gross focal neurologic deficits . Skin: Skin is warm, dry.  No rash or ulcers Psychiatric: Mood and affect are normal    Labs on Admission: I have personally reviewed following labs and imaging studies  CBC: Recent Labs  Lab 08/11/21 0644  WBC 12.9*  HGB 13.0  HCT 38.5*  MCV 93.9  PLT 0000000   Basic Metabolic Panel: Recent Labs  Lab 08/11/21 0644  NA 135  K 3.1*  CL 96*  CO2 29  GLUCOSE 167*  BUN 23  CREATININE 1.18  CALCIUM 9.4   GFR: Estimated Creatinine Clearance: 83.5 mL/min (by C-G formula based on SCr of 1.18 mg/dL). Liver Function Tests: No results for input(s): AST, ALT, ALKPHOS, BILITOT, PROT, ALBUMIN in the last 168 hours. No results for input(s): LIPASE, AMYLASE in the last 168 hours. No results for input(s): AMMONIA in the last 168 hours. Coagulation Profile: No results for input(s): INR, PROTIME in the last 168 hours. Cardiac Enzymes: No results for input(s): CKTOTAL, CKMB, CKMBINDEX, TROPONINI in the last 168 hours. BNP (last 3 results) No results for input(s): PROBNP in the last 8760 hours. HbA1C: No results for input(s): HGBA1C in the last 72 hours. CBG: No results for input(s): GLUCAP in the last 168 hours. Lipid Profile: No results for input(s): CHOL, HDL, LDLCALC, TRIG, CHOLHDL, LDLDIRECT in the last 72 hours. Thyroid Function Tests: No results for input(s): TSH, T4TOTAL, FREET4, T3FREE, THYROIDAB in the last 72 hours. Anemia Panel: No results for input(s): VITAMINB12, FOLATE, FERRITIN, TIBC, IRON, RETICCTPCT in the last 72 hours. Urine analysis:    Component Value Date/Time   COLORURINE YELLOW (A) 01/06/2021 1200   APPEARANCEUR CLEAR (A) 01/06/2021 1200   APPEARANCEUR Clear 09/18/2014 2018   LABSPEC 1.013 01/06/2021 1200   LABSPEC 1.005 09/18/2014 2018   PHURINE 7.0 01/06/2021 1200   GLUCOSEU 50 (A) 01/06/2021 1200   GLUCOSEU 150 mg/dL 09/18/2014 2018   HGBUR NEGATIVE 01/06/2021 1200   BILIRUBINUR NEGATIVE  01/06/2021 1200   BILIRUBINUR Negative 09/18/2014 2018   KETONESUR NEGATIVE 01/06/2021 1200   PROTEINUR NEGATIVE 01/06/2021 1200   UROBILINOGEN 0.2 09/12/2013 1555   NITRITE NEGATIVE 01/06/2021 1200   LEUKOCYTESUR NEGATIVE 01/06/2021 1200   LEUKOCYTESUR Negative 09/18/2014 2018    Radiological Exams on Admission: DG Chest 2 View  Result Date: 08/11/2021 CLINICAL DATA:  60 year old male with shortness of breath, progressive for 1 week. EXAM: CHEST - 2 VIEW COMPARISON:  Portable chest 05/13/2021 and earlier. FINDINGS: Seated AP and lateral views. Prior sternotomy. Stable mild to moderate cardiomegaly. Other mediastinal contours are within normal limits. Visualized tracheal air column is within normal limits. Lung volumes remain within normal limits. Evidence of small bilateral layering pleural effusions and increased pulmonary vascularity diffusely. Still, vascular congestion is regressed compared to September. No pneumothorax or consolidation. No acute osseous abnormality identified. Abdominal Calcified aortic atherosclerosis. Negative visible bowel gas pattern. IMPRESSION: 1. Chronic cardiomegaly with small bilateral pleural effusions. Vascular congestion without overt edema. 2.  Aortic Atherosclerosis (ICD10-I70.0). Electronically Signed   By: Genevie Ann M.D.   On: 08/11/2021 07:44     Assessment/Plan Principal Problem:   Acute on chronic systolic CHF (congestive heart failure) (HCC) Active Problems:   PAF (paroxysmal atrial fibrillation) (HCC)   Type 2 diabetes mellitus without complication (HCC)   Anxiety   Hypokalemia   Ischemic cardiomyopathy   Bradycardia       Acute on chronic systolic heart failure Patient has a history of chronic systolic heart failure with last known LVEF of 35 to 40% He presents for evaluation of worsening shortness of breath from his baseline associated with PND and orthopnea. Chest x-ray shows evidence of pulmonary vascular congestion and BNP is  elevated Place patient on Lasix 40 mg IV every 12 Continue Entresto, BiDil and Farxiga Metoprolol is on hold  due to asymptomatic bradycardia         History of paroxysmal atrial fibrillation Status post DC cardioversion Continue Eliquis as primary prophylaxis for an acute stroke  Continue Tikosyn Metoprolol is on hold due to bradycardia         Diabetes mellitus Maintain consistent carbohydrate diet Glycemic control with sliding scale insulin Check blood sugars before meals and at bedtime         Hypokalemia Secondary to diuretic use Recommend potassium       Anxiety disorder Continue diazepam     Bradycardia Asymptomatic Patient is noted to have heart rate in the 40s while awake but denies having any dizziness, no lightheadedness or loss of consciousness Hold metoprolol for now Obtain TSH level        DVT prophylaxis: Apixaban Code Status: full code  Family Communication: Greater than 50% of time was spent discussing patient's condition and plan of care with him at the bedside.  All questions and concerns have been addressed.  He verbalizes understanding and agrees to the plan.  CODE STATUS was discussed and he is a full code Disposition Plan: Back to previous home environment Consults called: Cardiology Status:At the time of admission, it appears that the appropriate admission status for this patient is inpatient. This is judged to be reasonable and necessary to provide the required intensity of service to ensure the patient's safety given the presenting symptoms, physical exam findings, and initial radiographic and laboratory data in the context of their comorbid conditions. Patient requires inpatient status due to high intensity of service, high risk for further deterioration and high frequency of surveillance required.     Collier Bullock MD [facility]     08/11/2021, 10:28 AM

## 2021-08-11 NOTE — ED Notes (Signed)
Pt given meal tray. Pt tolerating well.

## 2021-08-11 NOTE — Consult Note (Signed)
Cardiology Consultation:   Patient ID: Marc [name] MRN: [id number]; DOB: 1961/02/03  Admit date: 08/11/2021 Date of Consult: 08/11/2021  PCP:  [facility]   [facility] Providers Cardiologist:  Debbe Odea, MD  Electrophysiologist:  Lanier Prude, MD  {  Patient Profile:   QUY LOTTS is a 60 y.o. male with a hx of CABG x5 ([place] to LAD SVG to Ramus /OM2, OM3), afib s/p DCCV now on Tikosyn, HFrEF LVEF 35-40%, HTN, DM2, dyslipidemia who is being seen 08/11/2021 for the evaluation of acute CHF at the request of Dr. Joylene Igo.  History of Present Illness:   Mr. Mairena admitted in 08/2013 with LHC at that time showing multivessel CAD with an EF of 35%. He underwent CABG x 5 09/2013. Despite surgical revascularization and medical therapy, his cardiomyopathy has persisted with Echo 03/2018 showing EF 40-45% with probable HK, G1DD, mild bilateral enlartement. Eho 06/2020 showed EF 40-45%, global HK, moderate LVH, G1DD, moderately reduced RVSF with normal ventricular size and mildly increased wall thickness, normal PASP, mild biatral enlargement, trivial MR, mild aortic valve sclerosis, mild dilation of the transverse aorta measuring 34mm.   Admitted 12/2020 for weakness found to have potassium >7.5, felt to be related to TMP-SMX.lisinopril and spiro stopped. Readmitted 01/2021 with hig K as he has not stopped lisinorpil and spironolactone.Advised to take lasix PRN. He was found to be in Afib worsenign heart failure. He underwent successful cardioversion 02/23/21.   Admitted to the hospital 02/2021 with CHF exacerbation and NSTEMI with HS trop peak of 3688. Echo showed FE 35-40%. R/LHC showed severe underlying left main and three-vessel CAD with patent LIMA to LAD, patent SVG to OM2/ramus, occluded SVG to OM 3, and a known small RCA that was nondominant and occluded proximally with left to right collaterals.  RHC showed mildly elevated wedge pressure at 20 mmHg with a  prominent V wave, mild pulmonary hypertension, and moderately reduced cardiac output.  Medical therapy was recommended with no good options to revascularize the OM 3 and LPL distributions given the need to stent the left main, mid LCx, and 3 branches of LCx.  The risk is felt to outweigh the benefit.  Tikosyn was initiated with subsequent conversion to sinus rhythm.  Admitted to [facility] 04/2021 for bradycardia that improved with holding Coreg.   Admitted 05/2021 for acute CHF with HS trop up to 1570, initially requiring Bipap. CXR with vascular congestion. He was treated with IV heparin for 48 hours. Given IV lasix. Bidil was stopped for hypotension. He went into Afib. Tikosyn continued on d/c. Planned to see EP  Saw EP 06/08/21 and was in SR. Cardiac MRI Was ordered to assess LV function. This showed dense scar in the lateral LV and EF 31%. Discussed CRT-D and device implant was scheduled. HE was in SR at follow-up 07/27/21.   The patient presented to the ER at [facility] 08/11/21 for shortness of breath. This had been occurring a couple weeks. Worse on exertion. Couldn't sleep due to breathing issues. No lower leg edema. Reports abdominal fullness. Drinks a lot of water. Eats low salt diet. He was taking torsemide 40mg  daily. Reports relatively stable weight at 245lbs.  In the ER BP 123/66, pulse 72, afebrile, O2 98%. sodium 135, potassium 3.1, bicarb 29, Scr 1.18, BUN 23, BNP 2855, HS trop 35>48, WBC 12.9, Hgb 13.0. plt 254. Respiratory panel is negative. CXR showed cardiomegaly with small b/l pleural effusions and vascular congestion. EKG with NSR, RBBB.  Past Medical History:  Diagnosis Date   Anxiety    Chronic systolic heart failure ([place])    a. 07/2013 Echo: Ejection fraction of 45-50% with possible inferior wall hypokinesis; b. 03/2018 Echo: EF 40-45%; c. 06/2020 Echo: EF 40-45%, glob HK. Mod LVH, gr1 DD. Nl PASP. Mild BAE. Triv MR. Mild AoV sclerosis. Mild Ao dil (49mm).   Coronary artery disease     Significant left main and Severe three-vessel coronary artery disease in December of 2014. He underwent CABG in January of 2015 with LIMA to LAD, sequential SVG to ramus/OM 2 and SVG to OM 3   Diabetes mellitus without complication ([place])    Hyperlipidemia    Hypertension    Ischemic cardiomyopathy    a. 07/2013 Echo: EF 45-50%; b. 03/2018 Echo: EF 40-45%; c. 06/2020 Echo: EF 40-45%.   Medical non-compliance    MI (myocardial infarction) ([place])    Obesity    PAD (peripheral artery disease) ([place])    a. 04/2020 PTA of L PT.   PAF (paroxysmal atrial fibrillation) ([place])    Tobacco abuse     Past Surgical History:  Procedure Laterality Date   AMPUTATION Left 04/08/2020   Procedure: AMPUTATION RAY;  Surgeon: Caroline More, DPM;  Location: [facility];  Service: Podiatry;  Laterality: Left;   AMPUTATION Left 06/25/2020   Procedure: AMPUTATION RAY 1ST RAY PARTIAL LEFT;  Surgeon: Caroline More, DPM;  Location: [facility];  Service: Podiatry;  Laterality: Left;   AMPUTATION TOE Right 02/29/2016   Procedure: AMPUTATION TOE;  Surgeon: Sharlotte Alamo, DPM;  Location: [facility];  Service: Podiatry;  Laterality: Right;  Great toe   CARDIAC CATHETERIZATION     [facility] no stent   CARDIOVERSION N/A 02/23/2021   Procedure: CARDIOVERSION;  Surgeon: Kate Sable, MD;  Location: [facility];  Service: Cardiovascular;  Laterality: N/A;   CORONARY ARTERY BYPASS GRAFT N/A 09/16/2013   Procedure: CORONARY ARTERY BYPASS GRAFTING (CABG);  Surgeon: Gaye Pollack, MD;  Location: [place];  Service: Open Heart Surgery;  Laterality: N/A;  CABG x four, using left internal mammary artery and right leg greater saphenous vein   INTRAOPERATIVE TRANSESOPHAGEAL ECHOCARDIOGRAM N/A 09/16/2013   Procedure: INTRAOPERATIVE TRANSESOPHAGEAL ECHOCARDIOGRAM;  Surgeon: Gaye Pollack, MD;  Location: [facility];  Service: Open Heart Surgery;  Laterality: N/A;   knee surgery Right    LASIK Bilateral    LOWER EXTREMITY ANGIOGRAPHY Left 04/07/2020    Procedure: Lower Extremity Angiography;  Surgeon: Katha Cabal, MD;  Location: [place];  Service: Cardiovascular;  Laterality: Left;   LOWER EXTREMITY ANGIOGRAPHY Left 04/09/2020   Procedure: Lower Extremity Angiography (Pedal Approach);  Surgeon: Katha Cabal, MD;  Location: [place] [facility];  Service: Cardiovascular;  Laterality: Left;   RIGHT/LEFT HEART CATH AND CORONARY/GRAFT ANGIOGRAPHY N/A 03/04/2021   Procedure: RIGHT/LEFT HEART CATH AND CORONARY/GRAFT ANGIOGRAPHY;  Surgeon: Wellington Hampshire, MD;  Location: [place];  Service: Cardiovascular;  Laterality: N/A;     Home Medications:  Prior to Admission medications   Medication Sig Start Date End Date Taking? Authorizing Provider  apixaban (ELIQUIS) 5 MG TABS tablet Take 1 tablet (5 mg total) by mouth 2 (two) times daily. 05/06/21  Yes Kate Sable, MD  atorvastatin (LIPITOR) 80 MG tablet Take 80 mg by mouth every evening.  01/31/18  Yes [provider]  calcium carbonate (TUMS - DOSED IN MG ELEMENTAL CALCIUM) 500 MG chewable tablet Chew 500 mg by mouth daily as needed for indigestion  or heartburn.   Yes [provider]  dapagliflozin propanediol (FARXIGA) 10 MG TABS tablet Take 1 tablet (10 mg total) by mouth daily. 03/08/21  Yes Sharen Hones, MD  diazepam (VALIUM) 5 MG tablet Take 5 mg by mouth every 8 (eight) hours as needed (severe anxiety).   Yes [provider]  dofetilide (TIKOSYN) 500 MCG capsule Take 1 capsule (500 mcg total) by mouth 2 (two) times daily. It is important you do not miss any doses of this medication. Call the office if you miss 3 doses. Make sure you notify the office with plenty of time for any refills. 08/05/21  Yes Agbor-Etang, Aaron Edelman, MD  DULoxetine (CYMBALTA) 30 MG capsule Take 30 mg by mouth daily. 08/10/21  Yes [provider]  insulin glargine (LANTUS SOLOSTAR) 100 UNIT/ML Solostar Pen Inject 42 Units into the skin daily. Patient taking  differently: Inject 50 Units into the skin daily. 01/07/21  Yes Rai, Ripudeep K, MD  isosorbide-hydrALAZINE (BIDIL) 20-37.5 MG tablet Take 1 tablet by mouth 3 (three) times daily.   Yes [provider]  metoprolol succinate (TOPROL-XL) 50 MG 24 hr tablet Take 1 tablet (50 mg total) by mouth daily. Take with or immediately following a meal. 08/09/21  Yes Agbor-Etang, Aaron Edelman, MD  Naphazoline-Pheniramine (VISINE-A OP) Place 1 drop into both eyes daily as needed (allergies).   Yes [provider]  nitroGLYCERIN (NITROSTAT) 0.4 MG SL tablet Place 1 tablet (0.4 mg total) under the tongue every 5 (five) minutes x 3 doses as needed for chest pain. 05/15/21  Yes Lorella Nimrod, MD  potassium chloride (KLOR-CON) 20 MEQ tablet Take 1 tablet (20 mEq total) by mouth daily. Take 1 tab by mouth twice a day for 5 days (05/31/21-06/04/21), and then take 1 tab by mouth once a day. 05/31/21  Yes Agbor-Etang, Aaron Edelman, MD  torsemide (DEMADEX) 20 MG tablet Take 40 mg by mouth daily.   Yes [provider]  acetaminophen (TYLENOL) 500 MG tablet Take 1,000 mg by mouth every 6 (six) hours as needed for moderate pain or headache.    [provider]  sacubitril-valsartan (ENTRESTO) 97-103 MG Take 1 tablet by mouth 2 (two) times daily.    [provider]    Inpatient Medications: Scheduled Meds:  apixaban  5 mg Oral BID   atorvastatin  80 mg Oral QPM   [START ON 08/12/2021] dapagliflozin propanediol  10 mg Oral Daily   dofetilide  500 mcg Oral BID   [START ON 08/12/2021] DULoxetine  30 mg Oral Daily   furosemide  40 mg Intravenous BID   insulin aspart  0-15 Units Subcutaneous TID WC   isosorbide-hydrALAZINE  1 tablet Oral TID   potassium chloride SA  20 mEq Oral Daily   sacubitril-valsartan  1 tablet Oral BID   sodium chloride flush  3 mL Intravenous Q12H   Continuous Infusions:  sodium chloride     PRN Meds: sodium chloride, acetaminophen, calcium carbonate, diazepam, nitroGLYCERIN,  ondansetron **OR** ondansetron (ZOFRAN) IV, sodium chloride flush  Allergies:    Allergies  Allergen Reactions   Spironolactone Other (See Comments)    HYPERKALEMIA    Social History:   Social History   Socioeconomic History   Marital status: Divorced    Spouse name: Not on file   Number of children: Not on file   Years of education: Not on file   Highest education level: Not on file  Occupational History   Not on file  Tobacco Use   Smoking  status: Former    Packs/day: 0.50    Years: 45.00    Pack years: 22.50    Types: Cigarettes    Quit date: 02/09/2021    Years since quitting: 0.5   Smokeless tobacco: Never  Vaping Use   Vaping Use: Never used  Substance and Sexual Activity   Alcohol use: No    Alcohol/week: 0.0 standard drinks   Drug use: No   Sexual activity: Not on file  Other Topics Concern   Not on file  Social History Narrative   Not on file   Social Determinants of Health   Financial Resource Strain: Not on file  Food Insecurity: Not on file  Transportation Needs: Not on file  Physical Activity: Not on file  Stress: Not on file  Social Connections: Not on file  Intimate Partner Violence: Not on file    Family History:    Family History  Problem Relation Age of Onset   Heart disease Mother      ROS:  Please see the history of present illness.   All other ROS reviewed and negative.     Physical Exam/Data:   Vitals:   08/11/21 1130 08/11/21 1300 08/11/21 1330 08/11/21 1345  BP: 135/73 125/87 (!) 143/83 (!) 142/79  Pulse: (!) 47 (!) 44 (!) 58 (!) 54  Resp: 16 16 12 14   Temp:      TempSrc:      SpO2: 97% 96% 97% 95%  Weight:      Height:        Intake/Output Summary (Last 24 hours) at 08/11/2021 1517 Last data filed at 08/11/2021 1402 Gross per 24 hour  Intake --  Output 1800 ml  Net -1800 ml   Last 3 Weights 08/11/2021 07/27/2021 06/08/2021  Weight (lbs) 245 lb 251 lb 3.2 oz 253 lb 9.6 oz  Weight (kg) 111.131 kg 113.944 kg 115.032  kg     Body mass index is 33.23 kg/m.  General:  Well nourished, well developed, in no acute distress HEENT: normal Neck: + JVD Vascular: No carotid bruits; Distal pulses 2+ bilaterally Cardiac:  normal S1, S2; bradycardia, RR; no murmur  Lungs:  crackles at bases Abd: soft, nontender, no hepatomegaly  Ext: no edema Musculoskeletal:  No deformities, BUE and BLE strength normal and equal Skin: warm and dry  Neuro:  CNs 2-12 intact, no focal abnormalities noted Psych:  Normal affect   EKG:  The EKG was personally reviewed and demonstrates:  NSR, 62bpm, first degee AV block, RBBB, no significant change Telemetry:  Telemetry was personally reviewed and demonstrates:  SB, HR upper 40s to 50s, PVCs, pauses less than 3 seconds  Relevant CV Studies:   Right and left Cardiac cath 03/2021   Ost RCA to Prox RCA lesion is 100% stenosed. Mid LM to Dist LM lesion is 90% stenosed. Ost LAD to Prox LAD lesion is 100% stenosed. Ost Cx to Prox Cx lesion is 50% stenosed. Mid Cx to Dist Cx lesion is 60% stenosed. 3rd Mrg lesion is 90% stenosed. 1st LPL lesion is 90% stenosed. 2nd LPL lesion is 90% stenosed. SVG graft was visualized by angiography. Prox Graft to Mid Graft lesion is 30% stenosed. Origin to Prox Graft lesion is 100% stenosed. LIMA graft was visualized by angiography and is normal in caliber. The graft exhibits no disease. Dist LAD lesion is 60% stenosed. Mid LAD lesion is 70% stenosed.   1.  Severe underlying left main and three-vessel coronary artery disease with patent LIMA  to LAD and patent SVG to OM2/ramus.  Occluded SVG to OM 3.  The RCA is known to be small and nondominant and occluded proximally with some left-to-right collaterals. 2.  Left ventricular angiography was not performed.  EF was moderately reduced by echo. 3.  Right heart catheterization showed mildly elevated wedge pressure at 20 mmHg with a prominent V wave, mild pulmonary hypertension and moderately reduced  cardiac output.   Recommendations: Recommend continuing medical therapy for coronary artery disease.  No good options to revascularize the OM 3 and LPL distributions given the need to stent the left main, mid left circumflex and 3 branches of the left circumflex.  Risks probably outweigh the benefit. The best option is to treat medically including optimizing heart failure and rhythm control. The patient is still volume overloaded.  Continue IV diuresis for another day and likely switch to oral diuretic tomorrow. Resume heparin drip in 6 hours and can switch to Eliquis tomorrow morning if no bleeding issues from the cath site.  Coronary Diagrams  Diagnostic Dominance: Left   Echo 02/2021   1. Left ventricular ejection fraction, by estimation, is 35 to 40%. The  left ventricle has moderately decreased function. Left ventricular  endocardial border not optimally defined to evaluate regional wall motion.  There is moderate asymmetric left  ventricular hypertrophy of the posterior segment. Left ventricular  diastolic function could not be evaluated.   2. Right ventricular systolic function was not well visualized. The right  ventricular size is not well visualized. Mildly increased right  ventricular wall thickness.   3. The mitral valve is degenerative. Unable to accurately assess mitral  valve regurgitation.   4. The aortic valve is tricuspid. There is mild calcification of the  aortic valve. There is mild thickening of the aortic valve. Aortic valve  regurgitation not well assessed. Mild to moderate aortic valve  sclerosis/calcification is present, without any  evidence of aortic stenosis.   Echo 06/2020  1. Left ventricular ejection fraction, by estimation, is 40 to 45%. The  left ventricle has mildly decreased function. The left ventricle  demonstrates global hypokinesis. The left ventricular internal cavity size  was moderately dilated. There is moderate   left ventricular  hypertrophy. Left ventricular diastolic parameters are  consistent with Grade I diastolic dysfunction (impaired relaxation).   2. Right ventricular systolic function is moderately reduced. The right  ventricular size is normal. Mildly increased right ventricular wall  thickness. There is normal pulmonary artery systolic pressure.   3. Left atrial size was mildly dilated.   4. Right atrial size was mildly dilated.   5. The mitral valve is grossly normal. Trivial mitral valve  regurgitation.   6. The aortic valve is tricuspid. There is mild thickening of the aortic  valve. Aortic valve regurgitation is not visualized. Mild aortic valve  sclerosis is present, with no evidence of aortic valve stenosis.   7. Aortic dilatation noted. There is mild dilatation of the transverse  aorta, measuring 34 mm.   8. The inferior vena cava is normal in size with greater than 50%  respiratory variability, suggesting right atrial pressure of 3 mmHg.    Laboratory Data:  High Sensitivity Troponin:   Recent Labs  Lab 08/11/21 0644 08/11/21 0802 08/11/21 1129 08/11/21 1401  TROPONINIHS 35* 48* 58* 54*     Chemistry Recent Labs  Lab 08/11/21 0644 08/11/21 0802  NA 135  --   K 3.1*  --   CL 96*  --  CO2 29  --   GLUCOSE 167*  --   BUN 23  --   CREATININE 1.18  --   CALCIUM 9.4  --   MG  --  2.2  GFRNONAA >60  --   ANIONGAP 10  --     No results for input(s): PROT, ALBUMIN, AST, ALT, ALKPHOS, BILITOT in the last 168 hours. Lipids No results for input(s): CHOL, TRIG, HDL, LABVLDL, LDLCALC, CHOLHDL in the last 168 hours.  Hematology Recent Labs  Lab 08/11/21 0644  WBC 12.9*  RBC 4.10*  HGB 13.0  HCT 38.5*  MCV 93.9  MCH 31.7  MCHC 33.8  RDW 17.1*  PLT 254   Thyroid No results for input(s): TSH, FREET4 in the last 168 hours.  BNP Recent Labs  Lab 08/11/21 0644  BNP 2,855.4*    DDimer No results for input(s): DDIMER in the last 168 hours.   Radiology/Studies:  DG Chest 2  View  Result Date: 08/11/2021 CLINICAL DATA:  60 year old male with shortness of breath, progressive for 1 week. EXAM: CHEST - 2 VIEW COMPARISON:  Portable chest 05/13/2021 and earlier. FINDINGS: Seated AP and lateral views. Prior sternotomy. Stable mild to moderate cardiomegaly. Other mediastinal contours are within normal limits. Visualized tracheal air column is within normal limits. Lung volumes remain within normal limits. Evidence of small bilateral layering pleural effusions and increased pulmonary vascularity diffusely. Still, vascular congestion is regressed compared to September. No pneumothorax or consolidation. No acute osseous abnormality identified. Abdominal Calcified aortic atherosclerosis. Negative visible bowel gas pattern. IMPRESSION: 1. Chronic cardiomegaly with small bilateral pleural effusions. Vascular congestion without overt edema. 2.  Aortic Atherosclerosis (ICD10-I70.0). Electronically Signed   By: Genevie Ann M.D.   On: 08/11/2021 07:44     Assessment and Plan:   Acute on chronic systolic and diastolic CHF ICM - presents with SOB and abdominal fullness, says he drinks a lot of water. BNP elevated and CXR with vascular congestion.  - PTA torsemide 40mg  daily - Echo with LVEF 35-40% - IV lasix 40mg  BID - metoprolol held for bradycardia - continue Entresto, Bidil and Farxiga - planning ICD implantation for low EF>>following with EP - continue with diuresis - monitor kidney function, daily weights and strict I/Os  Elevated troponin  CAD s/p prior CABG x5 - HS trop elevated to 58 and down trending - No chest pain reported - LHC in 03/2021 demonstrated severe underlying multivessel CAD as outlined above with recommendation to continue medical therapy given that there were no good targets for revascularization and risk was felt to outweigh benefit - would not start IV heparin - continue medical management  Paroxysmal Afib s/p DC cardioversion - EKG with SB, BB held -  continue Eliquis - continue rate control - continue Tikosyn, Qtc 525ms  DM2 - SSI per IM  Hypokalemia - supplement potassium - h/o hyperkalemia - may benefit from sprio  Bradycardia - HR into the 40s>>BB held - continue telemetry  For questions or updates, please contact [facility] Please consult [email or web address] for contact info under    Signed, Nastassia Bazaldua Ninfa Meeker, PA-C  08/11/2021 3:17 PM

## 2021-08-12 DIAGNOSIS — F419 Anxiety disorder, unspecified: Secondary | ICD-10-CM

## 2021-08-12 LAB — GLUCOSE, CAPILLARY
Glucose-Capillary: 187 mg/dL — ABNORMAL HIGH (ref 70–99)
Glucose-Capillary: 148 mg/dL — ABNORMAL HIGH (ref 70–99)
Glucose-Capillary: 159 mg/dL — ABNORMAL HIGH (ref 70–99)

## 2021-08-12 LAB — HEMOGLOBIN A1C
Hgb A1c MFr Bld: 7 % — ABNORMAL HIGH (ref 4.8–5.6)
Mean Plasma Glucose: 154 mg/dL

## 2021-08-12 MED ORDER — POTASSIUM CHLORIDE CRYS ER 20 MEQ PO TBCR
40.0000 meq | EXTENDED_RELEASE_TABLET | Freq: Every day | ORAL | Status: DC
Start: 2021-08-13 — End: 2021-08-13

## 2021-08-12 MED ORDER — POTASSIUM CHLORIDE CRYS ER 20 MEQ PO TBCR
40.0000 meq | EXTENDED_RELEASE_TABLET | Freq: Once | ORAL | Status: AC
  Administered 2021-08-12: 40 meq via ORAL
  Filled 2021-08-12: qty 2

## 2021-08-12 NOTE — Progress Notes (Signed)
PROGRESS NOTE    Marc [name]  [id number] DOB: 1961-07-05 DOA: 08/11/2021 PCP: [facility]  [id number]   Assessment & Plan:   Principal Problem:   Acute on chronic systolic CHF (congestive heart failure) ([place]) Active Problems:   PAF (paroxysmal atrial fibrillation) ([place])   Type 2 diabetes mellitus without complication ([place])   Anxiety   Hypokalemia   Ischemic cardiomyopathy   Bradycardia   Marc [name] is a 60 y.o. male with medical history significant for atrial fibrillation status post DC cardioversion, history of chronic systolic CHF last known LVEF of 35 to 40%, history of coronary artery disease status post CABG x4, hypertension, diabetes mellitus and dyslipidemia who presents to the emergency room for evaluation of worsening shortness of breath from his baseline associated with orthopnea and PND. Patient states that he was had progressive shortness of breath over the last 1 week and over the last 2 days has had orthopnea and PND.  He has not had any sleep in the last 2 days due to difficulty breathing.  He admits to increased fluid intake because his medications make his mouth very dry but denies any dietary indiscretion and states that he is compliant with prescribed medications.   Acute on chronic combined heart failure --last known LVEF of 35 to 40% --worsening shortness of breath from his baseline associated with PND and orthopnea. Chest x-ray shows evidence of pulmonary vascular congestion and BNP is elevated --started on IV lasix 40 mg BID --cardio consulted Plan: --cont IV lasix 40 mg BID - At the time of discharge will recommend torsemide 40 in the morning 20 in the afternoon  Ischemic cardiomyopathy --hold metop for bradycardia - continue Entresto, Bidil and Farxiga   Elevated troponin  CAD s/p prior CABG x5 - HS trop elevated to 58 and down trending Denies anginal symptoms - LHC in 03/2021 demonstrated severe underlying multivessel  CAD  Medical management at this time   History of paroxysmal atrial fibrillation Status post DC cardioversion --Continue Eliquis  --Continue Tikosyn --hold metop due to bradycardia   Diabetes mellitus, well controlled --A1c 7.0 --SSI while inpatient    Hypokalemia Secondary to diuretic use Monitor and replete PRN   Anxiety disorder Continue home diazepam    Bradycardia Asymptomatic Patient is noted to have heart rate in the 40s while awake but denies having any dizziness, no lightheadedness or loss of consciousness --hold metop    DVT prophylaxis: [id number] Code Status: Full code  Family Communication:  Level of care: Telemetry Cardiac Dispo:   The patient is from: home Anticipated d/c is to: home Anticipated d/c date is: 1-2 days Patient currently is not medically ready to d/c due to: IV lasix   Subjective and Interval History:  Pt reported breathing and feeling better.  Still complained of dry mouth.  Reported good urine output.   Objective: Vitals:   08/12/21 0531 08/12/21 0859 08/12/21 1153 08/12/21 1501  BP: (!) 160/88 (!) 142/82 (!) 146/72 (!) 153/77  Pulse: 71  61 62  Resp: 20  17 17   Temp: 98.5 F (36.9 C) 98.2 F (36.8 C) 98 F (36.7 C) 98.4 F (36.9 C)  TempSrc:  Oral    SpO2: 94% 95% 97% 97%  Weight: 114.8 kg     Height:        Intake/Output Summary (Last 24 hours) at 08/12/2021 2304 Last data filed at 08/12/2021 1720 Gross per 24 hour  Intake 1140 ml  Output 2600 ml  Net -1460 ml  Filed Weights   08/11/21 0640 08/12/21 0531  Weight: 111.1 kg 114.8 kg    Examination:   Constitutional: NAD, AAOx3 HEENT: conjunctivae and lids normal, EOMI, mucosa dry CV: No cyanosis.   RESP: normal respiratory effort, on RA Extremities: mild edema in BLE SKIN: warm, dry Neuro: II - XII grossly intact.   Psych: flat mood and affect.  Appropriate judgement and reason   Data Reviewed: I have personally reviewed following labs and imaging  studies  CBC: Recent Labs  Lab 08/11/21 0644  WBC 12.9*  HGB 13.0  HCT 38.5*  MCV 93.9  PLT 0000000   Basic Metabolic Panel: Recent Labs  Lab 08/11/21 0644 08/11/21 0802  NA 135  --   K 3.1*  --   CL 96*  --   CO2 29  --   GLUCOSE 167*  --   BUN 23  --   CREATININE 1.18  --   CALCIUM 9.4  --   MG  --  2.2   GFR: Estimated Creatinine Clearance: 84.9 mL/min (by C-G formula based on SCr of 1.18 mg/dL). Liver Function Tests: No results for input(s): AST, ALT, ALKPHOS, BILITOT, PROT, ALBUMIN in the last 168 hours. No results for input(s): LIPASE, AMYLASE in the last 168 hours. No results for input(s): AMMONIA in the last 168 hours. Coagulation Profile: No results for input(s): INR, PROTIME in the last 168 hours. Cardiac Enzymes: No results for input(s): CKTOTAL, CKMB, CKMBINDEX, TROPONINI in the last 168 hours. BNP (last 3 results) No results for input(s): PROBNP in the last 8760 hours. HbA1C: Recent Labs    08/11/21 1129  HGBA1C 7.0*   CBG: Recent Labs  Lab 08/11/21 1840 08/11/21 2239 08/12/21 1155 08/12/21 1719 08/12/21 2046  GLUCAP 189* 133* 187* 148* 159*   Lipid Profile: No results for input(s): CHOL, HDL, LDLCALC, TRIG, CHOLHDL, LDLDIRECT in the last 72 hours. Thyroid Function Tests: No results for input(s): TSH, T4TOTAL, FREET4, T3FREE, THYROIDAB in the last 72 hours. Anemia Panel: No results for input(s): VITAMINB12, FOLATE, FERRITIN, TIBC, IRON, RETICCTPCT in the last 72 hours. Sepsis Labs: No results for input(s): PROCALCITON, LATICACIDVEN in the last 168 hours.  Recent Results (from the past 240 hour(s))  Resp Panel by RT-PCR (Flu A&B, Covid) Nasopharyngeal Swab     Status: None   Collection Time: 08/11/21  7:58 AM   Specimen: Nasopharyngeal Swab; Nasopharyngeal(NP) swabs in vial transport medium  Result Value Ref Range Status   SARS Coronavirus 2 by RT PCR NEGATIVE NEGATIVE Final    Comment: (NOTE) SARS-CoV-2 target nucleic acids are NOT  DETECTED.  The SARS-CoV-2 RNA is generally detectable in upper respiratory specimens during the acute phase of infection. The lowest concentration of SARS-CoV-2 viral copies this assay can detect is 138 copies/mL. A negative result does not preclude SARS-Cov-2 infection and should not be used as the sole basis for treatment or other patient management decisions. A negative result may occur with  improper specimen collection/handling, submission of specimen other than nasopharyngeal swab, presence of viral mutation(s) within the areas targeted by this assay, and inadequate number of viral copies(<138 copies/mL). A negative result must be combined with clinical observations, patient history, and epidemiological information. The expected result is Negative.  Fact Sheet for Patients:  EntrepreneurPulse.com.au  Fact Sheet for Healthcare Providers:  IncredibleEmployment.be  This test is no t yet approved or cleared by the [place] FDA and  has been authorized for detection and/or diagnosis of SARS-CoV-2 by FDA under an Emergency Use  Authorization (EUA). This EUA will remain  in effect (meaning this test can be used) for the duration of the COVID-19 declaration under Section 564(b)(1) of the Act, 21 U.S.C.section 360bbb-3(b)(1), unless the authorization is terminated  or revoked sooner.       Influenza A by PCR NEGATIVE NEGATIVE Final   Influenza B by PCR NEGATIVE NEGATIVE Final    Comment: (NOTE) The Xpert Xpress SARS-CoV-2/FLU/RSV plus assay is intended as an aid in the diagnosis of influenza from Nasopharyngeal swab specimens and should not be used as a sole basis for treatment. Nasal washings and aspirates are unacceptable for Xpert Xpress SARS-CoV-2/FLU/RSV testing.  Fact Sheet for Patients: BloggerCourse.com  Fact Sheet for Healthcare Providers: SeriousBroker.it  This test is not yet  approved or cleared by the [place] FDA and has been authorized for detection and/or diagnosis of SARS-CoV-2 by FDA under an Emergency Use Authorization (EUA). This EUA will remain in effect (meaning this test can be used) for the duration of the COVID-19 declaration under Section 564(b)(1) of the Act, 21 U.S.C. section 360bbb-3(b)(1), unless the authorization is terminated or revoked.  Performed at [facility], [address], [place], [place] [address]       Radiology Studies: DG Chest 2 View  Result Date: 08/11/2021 CLINICAL DATA:  60 year old male with shortness of breath, progressive for 1 week. EXAM: CHEST - 2 VIEW COMPARISON:  Portable chest 05/13/2021 and earlier. FINDINGS: Seated AP and lateral views. Prior sternotomy. Stable mild to moderate cardiomegaly. Other mediastinal contours are within normal limits. Visualized tracheal air column is within normal limits. Lung volumes remain within normal limits. Evidence of small bilateral layering pleural effusions and increased pulmonary vascularity diffusely. Still, vascular congestion is regressed compared to September. No pneumothorax or consolidation. No acute osseous abnormality identified. Abdominal Calcified aortic atherosclerosis. Negative visible bowel gas pattern. IMPRESSION: 1. Chronic cardiomegaly with small bilateral pleural effusions. Vascular congestion without overt edema. 2.  Aortic Atherosclerosis (ICD10-I70.0). Electronically Signed   By: Odessa Fleming M.D.   On: 08/11/2021 07:44     Scheduled Meds:  apixaban  5 mg Oral BID   atorvastatin  80 mg Oral QPM   dapagliflozin propanediol  10 mg Oral Daily   dofetilide  500 mcg Oral BID   DULoxetine  30 mg Oral Daily   furosemide  40 mg Intravenous BID   insulin aspart  0-15 Units Subcutaneous TID WC   isosorbide-hydrALAZINE  1 tablet Oral TID   [START ON 08/13/2021] potassium chloride SA  40 mEq Oral Daily   sacubitril-valsartan  1 tablet Oral BID   sodium  chloride flush  3 mL Intravenous Q12H   Continuous Infusions:  sodium chloride       LOS: 1 day     Darlin Priestly, MD [facility] If 7PM-7AM, please contact night-coverage 08/12/2021, 11:04 PM

## 2021-08-12 NOTE — Progress Notes (Addendum)
Progress Note  Patient Name: Marc [name] Date of Encounter: 08/12/2021  [facility] Cardiologist: Kate Sable, MD   Subjective   Reports feeling somewhat better, abdomen less distended Still with leg swelling, shortness of breath " I drink too much water" Reports current symptoms not as bad as over the summer when his " legs were busting, blistering, weeping"  Inpatient Medications    Scheduled Meds:  apixaban  5 mg Oral BID   atorvastatin  80 mg Oral QPM   dapagliflozin propanediol  10 mg Oral Daily   dofetilide  500 mcg Oral BID   DULoxetine  30 mg Oral Daily   furosemide  40 mg Intravenous BID   insulin aspart  0-15 Units Subcutaneous TID WC   isosorbide-hydrALAZINE  1 tablet Oral TID   potassium chloride SA  20 mEq Oral Daily   sacubitril-valsartan  1 tablet Oral BID   sodium chloride flush  3 mL Intravenous Q12H   Continuous Infusions:  sodium chloride     PRN Meds: sodium chloride, acetaminophen, calcium carbonate, diazepam, nitroGLYCERIN, ondansetron **OR** ondansetron (ZOFRAN) IV, sodium chloride flush   Vital Signs    Vitals:   08/11/21 2000 08/11/21 2155 08/12/21 0531 08/12/21 0859  BP: 128/67 128/60 (!) 160/88 (!) 142/82  Pulse: (!) 52 (!) 44 71   Resp: 19 20 20    Temp:  98.5 F (36.9 C) 98.5 F (36.9 C) 98.2 F (36.8 C)  TempSrc:    Oral  SpO2: 99% 93% 94% 95%  Weight:   114.8 kg   Height:        Intake/Output Summary (Last 24 hours) at 08/12/2021 1117 Last data filed at 08/12/2021 1007 Gross per 24 hour  Intake 180 ml  Output 2850 ml  Net -2670 ml   Last 3 Weights 08/12/2021 08/11/2021 07/27/2021  Weight (lbs) 253 lb 1.4 oz 245 lb 251 lb 3.2 oz  Weight (kg) 114.8 kg 111.131 kg 113.944 kg      Telemetry     - Personally Reviewed  ECG    - Personally Reviewed  Physical Exam   GEN: No acute distress.   Neck: Unable to estimate JVD Cardiac: RRR, no murmurs, rubs, or gallops.  Respiratory: Clear scattered Rales at the  bases GI: Soft, nontender, non-distended  MS: No edema; No deformity. Neuro:  Nonfocal  Psych: Normal affect   Labs    High Sensitivity Troponin:   Recent Labs  Lab 08/11/21 0644 08/11/21 0802 08/11/21 1129 08/11/21 1401  TROPONINIHS 35* 48* 58* 54*     Chemistry Recent Labs  Lab 08/11/21 0644 08/11/21 0802  NA 135  --   K 3.1*  --   CL 96*  --   CO2 29  --   GLUCOSE 167*  --   BUN 23  --   CREATININE 1.18  --   CALCIUM 9.4  --   MG  --  2.2  GFRNONAA >60  --   ANIONGAP 10  --     Lipids No results for input(s): CHOL, TRIG, HDL, LABVLDL, LDLCALC, CHOLHDL in the last 168 hours.  Hematology Recent Labs  Lab 08/11/21 0644  WBC 12.9*  RBC 4.10*  HGB 13.0  HCT 38.5*  MCV 93.9  MCH 31.7  MCHC 33.8  RDW 17.1*  PLT 254   Thyroid No results for input(s): TSH, FREET4 in the last 168 hours.  BNP Recent Labs  Lab 08/11/21 0644  BNP 2,855.4*    DDimer No results for  input(s): DDIMER in the last 168 hours.   Radiology    DG Chest 2 View  Result Date: 08/11/2021 CLINICAL DATA:  60 year old male with shortness of breath, progressive for 1 week. EXAM: CHEST - 2 VIEW COMPARISON:  Portable chest 05/13/2021 and earlier. FINDINGS: Seated AP and lateral views. Prior sternotomy. Stable mild to moderate cardiomegaly. Other mediastinal contours are within normal limits. Visualized tracheal air column is within normal limits. Lung volumes remain within normal limits. Evidence of small bilateral layering pleural effusions and increased pulmonary vascularity diffusely. Still, vascular congestion is regressed compared to September. No pneumothorax or consolidation. No acute osseous abnormality identified. Abdominal Calcified aortic atherosclerosis. Negative visible bowel gas pattern. IMPRESSION: 1. Chronic cardiomegaly with small bilateral pleural effusions. Vascular congestion without overt edema. 2.  Aortic Atherosclerosis (ICD10-I70.0). Electronically Signed   By: Odessa Fleming M.D.    On: 08/11/2021 07:44    Cardiac Studies     Patient Profile     Mr. Marc [name] is known to our cardiology clinic, as well as EP  Coronary disease, ischemic cardiomyopathy, history of CABG, ejection fraction 35%, prior history systolic CHF presenting with worsening fluid retention, abdominal distention, leg edema, shortness of breath consistent with CHF exacerbation  Assessment & Plan    Acute on chronic diastolic and systolic CHF High water intake, suspect some dietary indiscretion Reports compliance with his torsemide 40 daily Presenting with abdominal distention ,leg swelling ,shortness of breath --3.6 L Suspect he will need additional 24 hours of diuresis, still with mild abdominal distention, leg swelling -continue Lasix IV twice daily - At the time of discharge will recommend torsemide 40 in the morning 20 in the afternoon   Ischemic cardiomyopathy - Echo with LVEF 35-40% - metoprolol held for bradycardia - continue Entresto, Bidil and Farxiga - planning ICD implantation for low EF>>following with EP   Elevated troponin  CAD s/p prior CABG x5 - HS trop elevated to 58 and down trending Denies anginal symptoms - LHC in 03/2021 demonstrated severe underlying multivessel CAD  Medical management at this time   Paroxysmal Afib s/p DC cardioversion Maintaining normal sinus rhythm On Tikosyn Beta-blocker held for bradycardia   DM2 - SSI per IM   Hypokalemia - supplement potassium, extra 40 mill equivalents x1, change dose to 40 tomorrow Note indicate allergy or intolerance to spironolactone   Bradycardia - HR into the 40s>>BB held     For questions or updates, please contact [facility] Please consult [email or web address] for contact info under        Signed, Julien Nordmann, MD  08/12/2021, 11:17 AM

## 2021-08-12 NOTE — Consult Note (Signed)
Heart Failure Nurse Navigator Note  HFrEF 35-40%.  Moderate asymmetric left ventricular hypertrophy.  Mitral valve is degenerative.  Mild to moderate aortic sclerosis.  He presented to the emergency room with complaints of progressive shortness of breath for 1 week and also noted the last few days orthopnea and PND.  Chest x-ray revealed vascular congestion.  Comorbidities:  Coronary artery disease with coronary artery bypass grafting Right bundle branch block Diabetes Hypertension Hyperlipidemia Peripheral arterial disease Paroxysmal atrial fibrillation History of tobacco abuse quitting 6 months ago  Medications:  Eliquis 5 mg 2 times a day Atorvastatin 80 mg every evening Farxiga 10 mg daily Tikosyn 500 mcg 2 times a day Furosemide 40 mg IV 2 times a day BiDIL 20/37.5 mg 1 tablet 3 times a day Entresto 97/103 mg 2 times a day  He has an allergy listed to spironolactone.  Labs:   Sodium 135, potassium 3.1, chloride 96, CO2 29, BUN 23, creatinine 1.18, BNP 2855, troponin 54, magnesium 2.2, hemoglobin A1c 7 Intake not documented Output 3600 mL Weight 114.8 kg  Initial meeting with patient on this admission.  He was last discharged on May 15, 2021 after being admitted for heart failure..  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  Also went over his fluid intake, he states that he feels he is probably taken more 3 to 4 L in daily, explained that that is almost double what he should be taking in with a limit of 2 L or 64 ounces daily.  He states that he has a lot of problems with thirst.  Talked about filling up a container that holds 64 ounces and removing the amount he takes in each time from the container. Given a handout for ways to quench his thirst, ice chips, hard candy, frozen grapes or strawberries, chewing gum, taking sips of water rather than gulps, currently brushing his teeth and rinsing his mouth with mouthwash and also using popsicles but that would have to be added into his fluid restriction.  Also discussed his diet, he states for the last 6 months he is really only been eating about 1 meal a day.  He states he usually has Cheerios with milk in the morning but will try a sandwich at lunch but after taking a couple bites he feels that it is full and he gives the rest of the sandwich to his dog.  Discussed increasing abdominal girth and early satiety.  States that he felt his abdomen was larger.  He states that he is scheduled for BiV ICD in January 2023  He lives at home with his dog.  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  He had graduated from high school was always interested in big machinery ever since he was a small boy.  He is compliant with his medications and has not missed any of his doses.  Spoke with Clarisa Kindred in the outpatient heart failure clinic about his unsteadiness on his scale, we recommended that he use the scale  on a hard flat bottom chair and then would sit on that to  get his reading.  Discussed this with the patient, he is willing to try it.  Reinforced documenting his daily weights.  Also told him that if this did not work we had 1 more thing up our sleeve that we would try.  Instructed  I would call him next week after he has been discharged from the hospital to see how things are going.  He is due to see his cardiologist and be seen at that heart failure clinic on the same day, he states since he lives in [place] almost to [place] and its more convenient for him to have his appointments on  the same day.  He is scheduled to see cardiology at 8:20 and follow in the outpatient heart failure clinic on 9:30 on December 19.  He has a 17% rating of no-show, which is 9 out of 54 appointments.  Pricilla Riffle RN [facility]

## 2021-08-13 ENCOUNTER — Other Ambulatory Visit: Payer: Self-pay

## 2021-08-13 LAB — CBC
HCT: 42.5 % (ref 39.0–52.0)
Hemoglobin: 13.9 g/dL (ref 13.0–17.0)
MCH: 30.3 pg (ref 26.0–34.0)
MCHC: 32.7 g/dL (ref 30.0–36.0)
MCV: 92.8 fL (ref 80.0–100.0)
Platelets: 271 10*3/uL (ref 150–400)
RBC: 4.58 MIL/uL (ref 4.22–5.81)
RDW: 17.1 % — ABNORMAL HIGH (ref 11.5–15.5)
WBC: 10 10*3/uL (ref 4.0–10.5)
nRBC: 0 % (ref 0.0–0.2)

## 2021-08-13 LAB — GLUCOSE, CAPILLARY: Glucose-Capillary: 174 mg/dL — ABNORMAL HIGH (ref 70–99)

## 2021-08-13 LAB — BASIC METABOLIC PANEL
Anion gap: 11 (ref 5–15)
BUN: 22 mg/dL (ref 8–23)
CO2: 27 mmol/L (ref 22–32)
Calcium: 9.6 mg/dL (ref 8.9–10.3)
Chloride: 101 mmol/L (ref 98–111)
Creatinine, Ser: 0.99 mg/dL (ref 0.61–1.24)
GFR, Estimated: >60 mL/min (ref >60)
Glucose, Bld: 160 mg/dL — ABNORMAL HIGH (ref 70–99)
Potassium: 3.2 mmol/L — ABNORMAL LOW (ref 3.5–5.1)
Sodium: 139 mmol/L (ref 135–145)

## 2021-08-13 LAB — MAGNESIUM: Magnesium: 2.1 mg/dL (ref 1.7–2.4)

## 2021-08-13 MED ORDER — METOPROLOL TARTRATE 5 MG/5ML IV SOLN
5.0000 mg | Freq: Once | INTRAVENOUS | Status: AC
  Administered 2021-08-13: 5 mg via INTRAVENOUS

## 2021-08-13 MED ORDER — POTASSIUM CHLORIDE CRYS ER 10 MEQ PO TBCR
EXTENDED_RELEASE_TABLET | ORAL | Status: AC
  Administered 2021-08-13: 40 meq via ORAL
  Filled 2021-08-13: qty 4

## 2021-08-13 MED ORDER — SALINE SPRAY 0.65 % NA SOLN
1.0000 | NASAL | Status: DC | PRN
  Filled 2021-08-13: qty 44

## 2021-08-13 MED ORDER — POTASSIUM CHLORIDE CRYS ER 20 MEQ PO TBCR: 40.0000 meq | EXTENDED_RELEASE_TABLET | Freq: Once | ORAL | Status: DC

## 2021-08-13 MED ORDER — METOPROLOL TARTRATE 5 MG/5ML IV SOLN
10.0000 mg | Freq: Once | INTRAVENOUS | Status: DC
  Filled 2021-08-13: qty 10

## 2021-08-13 NOTE — TOC Transition Note (Signed)
Transition of Care [facility]) - CM/SW Discharge Note   Patient Details  Name: Marc [name] MRN: [id number] Date of Birth: Jul 02, 1961  [facility] CM/SW Contact:  Bing Quarry, RN Phone Number: 08/13/2021, 2:51 PM   Clinical Narrative:  Patient left AMA/elopement per provider.  Gabriel Cirri RN CM           Patient Goals and CMS Choice        Discharge Placement                       Discharge Plan and Services                                     Social Determinants of Health (SDOH) Interventions     Readmission Risk Interventions No flowsheet data found.

## 2021-08-13 NOTE — Progress Notes (Signed)
Pt c/o of SOB, feeling restless and anxious, HR in the 150s. EKG done, MD notified, and order to give IV metoprolol was initiated. Schedule morning meds were given, pt felt better after tx, and was up walking around the unit, RN will continue to monitor pt.

## 2021-08-13 NOTE — Progress Notes (Signed)
Pt left AMA, he was up walking around the unit, the tech went in to do 12 noon vitals, and he was not in the room. Pt left with the tele box on, and IV still intact. The MD , Nursing sup, and charge nurse aware, security was notified, and missing person alert initiated. Someone called up to the unit that pt was seen outside at the parking lot around 12 pm- pt never returned to room.

## 2021-08-13 NOTE — Progress Notes (Incomplete)
Progress Note  Patient Name: Marc [name] Date of Encounter: 08/13/2021  [facility] Cardiologist: Kate Sable, MD   Subjective   UOP -2.8L, net -5.2L. kidney function stable. Potassium 3.2  Inpatient Medications    Scheduled Meds:  apixaban  5 mg Oral BID   atorvastatin  80 mg Oral QPM   dapagliflozin propanediol  10 mg Oral Daily   dofetilide  500 mcg Oral BID   DULoxetine  30 mg Oral Daily   furosemide  40 mg Intravenous BID   insulin aspart  0-15 Units Subcutaneous TID WC   isosorbide-hydrALAZINE  1 tablet Oral TID   potassium chloride SA  40 mEq Oral Daily   potassium chloride  40 mEq Oral Once   sacubitril-valsartan  1 tablet Oral BID   sodium chloride flush  3 mL Intravenous Q12H   Continuous Infusions:  sodium chloride     PRN Meds: sodium chloride, acetaminophen, calcium carbonate, diazepam, nitroGLYCERIN, ondansetron **OR** ondansetron (ZOFRAN) IV, sodium chloride, sodium chloride flush   Vital Signs    Vitals:   08/13/21 0016 08/13/21 0441 08/13/21 0555 08/13/21 0800  BP: 139/68 (!) 158/98 (!) 145/85 (!) 159/96  Pulse: 75 87  93  Resp: 17 19  19   Temp: (!) 97.3 F (36.3 C) (!) 97.5 F (36.4 C) 97.8 F (36.6 C) 98.2 F (36.8 C)  TempSrc:   Oral Oral  SpO2: 95% 95% 98% 92%  Weight:   112 kg   Height:        Intake/Output Summary (Last 24 hours) at 08/13/2021 1254 Last data filed at 08/13/2021 1023 Gross per 24 hour  Intake 480 ml  Output 1800 ml  Net -1320 ml   Last 3 Weights 08/13/2021 08/12/2021 08/11/2021  Weight (lbs) 246 lb 14.4 oz 253 lb 1.4 oz 245 lb  Weight (kg) 111.993 kg 114.8 kg 111.131 kg      Telemetry    *** - Personally Reviewed  ECG    *** - Personally Reviewed  Physical Exam  *** GEN: No acute distress.   Neck: No JVD Cardiac: RRR, no murmurs, rubs, or gallops.  Respiratory: Clear to auscultation bilaterally. GI: Soft, nontender, non-distended  MS: No edema; No deformity. Neuro:  Nonfocal  Psych:  Normal affect   Labs    High Sensitivity Troponin:   Recent Labs  Lab 08/11/21 0644 08/11/21 0802 08/11/21 1129 08/11/21 1401  TROPONINIHS 35* 48* 58* 54*     Chemistry Recent Labs  Lab 08/11/21 0644 08/11/21 0802 08/13/21 0524  NA 135  --  139  K 3.1*  --  3.2*  CL 96*  --  101  CO2 29  --  27  GLUCOSE 167*  --  160*  BUN 23  --  22  CREATININE 1.18  --  0.99  CALCIUM 9.4  --  9.6  MG  --  2.2 2.1  GFRNONAA >60  --  >60  ANIONGAP 10  --  11    Lipids No results for input(s): CHOL, TRIG, HDL, LABVLDL, LDLCALC, CHOLHDL in the last 168 hours.  Hematology Recent Labs  Lab 08/11/21 0644 08/13/21 0524  WBC 12.9* 10.0  RBC 4.10* 4.58  HGB 13.0 13.9  HCT 38.5* 42.5  MCV 93.9 92.8  MCH 31.7 30.3  MCHC 33.8 32.7  RDW 17.1* 17.1*  PLT 254 271   Thyroid No results for input(s): TSH, FREET4 in the last 168 hours.  BNP Recent Labs  Lab 08/11/21 0644  BNP 2,855.4*  DDimer No results for input(s): DDIMER in the last 168 hours.   Radiology    No results found.  Cardiac Studies    Right and left Cardiac cath 03/2021   Ost RCA to Prox RCA lesion is 100% stenosed. Mid LM to Dist LM lesion is 90% stenosed. Ost LAD to Prox LAD lesion is 100% stenosed. Ost Cx to Prox Cx lesion is 50% stenosed. Mid Cx to Dist Cx lesion is 60% stenosed. 3rd Mrg lesion is 90% stenosed. 1st LPL lesion is 90% stenosed. 2nd LPL lesion is 90% stenosed. SVG graft was visualized by angiography. Prox Graft to Mid Graft lesion is 30% stenosed. Origin to Prox Graft lesion is 100% stenosed. LIMA graft was visualized by angiography and is normal in caliber. The graft exhibits no disease. Dist LAD lesion is 60% stenosed. Mid LAD lesion is 70% stenosed.   1.  Severe underlying left main and three-vessel coronary artery disease with patent LIMA to LAD and patent SVG to OM2/ramus.  Occluded SVG to OM 3.  The RCA is known to be small and nondominant and occluded proximally with some  left-to-right collaterals. 2.  Left ventricular angiography was not performed.  EF was moderately reduced by echo. 3.  Right heart catheterization showed mildly elevated wedge pressure at 20 mmHg with a prominent V wave, mild pulmonary hypertension and moderately reduced cardiac output.   Recommendations: Recommend continuing medical therapy for coronary artery disease.  No good options to revascularize the OM 3 and LPL distributions given the need to stent the left main, mid left circumflex and 3 branches of the left circumflex.  Risks probably outweigh the benefit. The best option is to treat medically including optimizing heart failure and rhythm control. The patient is still volume overloaded.  Continue IV diuresis for another day and likely switch to oral diuretic tomorrow. Resume heparin drip in 6 hours and can switch to Eliquis tomorrow morning if no bleeding issues from the cath site.   Coronary Diagrams   Diagnostic Dominance: Left   Echo 02/2021   1. Left ventricular ejection fraction, by estimation, is 35 to 40%. The  left ventricle has moderately decreased function. Left ventricular  endocardial border not optimally defined to evaluate regional wall motion.  There is moderate asymmetric left  ventricular hypertrophy of the posterior segment. Left ventricular  diastolic function could not be evaluated.   2. Right ventricular systolic function was not well visualized. The right  ventricular size is not well visualized. Mildly increased right  ventricular wall thickness.   3. The mitral valve is degenerative. Unable to accurately assess mitral  valve regurgitation.   4. The aortic valve is tricuspid. There is mild calcification of the  aortic valve. There is mild thickening of the aortic valve. Aortic valve  regurgitation not well assessed. Mild to moderate aortic valve  sclerosis/calcification is present, without any  evidence of aortic stenosis.    Echo 06/2020  1. Left  ventricular ejection fraction, by estimation, is 40 to 45%. The  left ventricle has mildly decreased function. The left ventricle  demonstrates global hypokinesis. The left ventricular internal cavity size  was moderately dilated. There is moderate   left ventricular hypertrophy. Left ventricular diastolic parameters are  consistent with Grade I diastolic dysfunction (impaired relaxation).   2. Right ventricular systolic function is moderately reduced. The right  ventricular size is normal. Mildly increased right ventricular wall  thickness. There is normal pulmonary artery systolic pressure.   3. Left atrial size  was mildly dilated.   4. Right atrial size was mildly dilated.   5. The mitral valve is grossly normal. Trivial mitral valve  regurgitation.   6. The aortic valve is tricuspid. There is mild thickening of the aortic  valve. Aortic valve regurgitation is not visualized. Mild aortic valve  sclerosis is present, with no evidence of aortic valve stenosis.   7. Aortic dilatation noted. There is mild dilatation of the transverse  aorta, measuring 34 mm.   8. The inferior vena cava is normal in size with greater than 50%  respiratory variability, suggesting right atrial pressure of 3 mmHg.    Patient Profile     60 y.o. male with a hx of CABG x5 (Ailey to LAD SVG to Ramus /OM2, OM3), afib s/p DCCV now on Tikosyn, HFrEF LVEF 35-40%, HTN, DM2, dyslipidemia who is being seen 08/11/2021 for the evaluation of acute CHF  Assessment & Plan    Acute on chronic systolic and diastolic CHF ICM - presents with SOB and abdominal fullness, says he drinks a lot of water. BNP elevated and CXR with vascular congestion.  - PTA torsemide 40mg  daily - Echo with LVEF 35-40% - IV lasix 40mg  BID - metoprolol held for bradycardia - continue Entresto, Bidil and Farxiga - planning ICD implantation for low EF>>following with EP - continue with diuresis - monitor kidney function, daily weights and strict  I/Os   Elevated troponin  CAD s/p prior CABG x5 - HS trop elevated to 58 and down trending - No chest pain reported - LHC in 03/2021 demonstrated severe underlying multivessel CAD as outlined above with recommendation to continue medical therapy given that there were no good targets for revascularization and risk was felt to outweigh benefit - continue medical management   Paroxysmal Afib s/p DC cardioversion - EKG with SB, BB held - continue Eliquis - continue rate control - continue Tikosyn, Qtc 555ms   DM2 - SSI per IM   Hypokalemia - supplement potassium - h/o hyperkalemia - may benefit from sprio   Bradycardia - HR into the 40s>>BB held   For questions or updates, please contact [facility] Please consult [email or web address] for contact info under        Signed, Galena Logie Ninfa Meeker, PA-C  08/13/2021, 12:54 PM

## 2021-08-13 NOTE — Progress Notes (Signed)
Pt c/o episode of 5/10 dull aching chest pain this morning, requesting nitro. Says this happens at home occasionally and nitro is helpful. 1 nitro given SL per PRN orders, vitals stable (see flowsheet for results), 2LPM o2 applied, EKG will be done shortly as the machine is currently in use, but pt appears NSR with PVC's on tele. Relayed this update to Manuela Schwartz NP of the event, no new orders. Pt reports complete relief of pain about 5 minutes after taking the nitro, but subsequently complains of a headache beginning. 1000mg  tylenol given per PRN orders as well.

## 2021-08-13 NOTE — Discharge Summary (Addendum)
Physician [facility] Discharge Summary   Marc [name]  male DOB: [birth year]  XJ:[id number]  [facility]P: [facility]  Admit date: 08/11/2021 Discharge date: left AMA on 08/13/21 without notifying anyone, took the tele box with him.  IV access was not removed by nursing.  CODE STATUS: Full code   Hospital Course:  For full details, please see H&P, progress notes, consult notes and ancillary notes.  Briefly,  Marc [name] is a 60 y.o. male with medical history significant for atrial fibrillation status post DC cardioversion, history of chronic systolic CHF last known LVEF of 35 to 40%, history of coronary artery disease status post CABG x4, hypertension, diabetes mellitus who presented to the emergency room for evaluation of worsening shortness of breath from his baseline associated with orthopnea and PND over the last 1 week.  He admits to increased fluid intake because his medications make his mouth very dry but denies any dietary indiscretion and states that he is compliant with prescribed medications.   Acute on chronic combined heart failure --last known LVEF of 35 to 40% --worsening shortness of breath from his baseline associated with PND and orthopnea. Chest x-ray shows evidence of pulmonary vascular congestion and BNP is elevated --started on IV lasix 40 mg BID --cardio consulted --Pt left AMA without notifying anyone, so discharge med rec was not done.   Ischemic cardiomyopathy Elevated troponin  CAD s/p prior CABG x5 - HS trop elevated to 58 and down trending Denies anginal symptoms - LHC in 03/2021 demonstrated severe underlying multivessel CAD    History of paroxysmal atrial fibrillation, with RVR Status post DC cardioversion --On home Eliquis and Tikosyn where were continued on presentation.   --home metop held on presentation due to bradycardia, however, pt went into Afib w RVR morning of 12/10.  IV metop 5 mg x1 given, then pt left the hospital without  notifying anyone.  Diabetes mellitus, well controlled --A1c 7.0 --SSI while inpatient    Hypokalemia Secondary to diuretic use Monitored and repleted PRN   Anxiety disorder Continue home diazepam    Bradycardia Asymptomatic Patient is noted to have heart rate in the 40s while awake but denies having any dizziness, no lightheadedness or loss of consciousness --home metop held on presentation due to bradycardia, however, pt went into Afib w RVR morning of 12/10.  IV metop 5 mg x1 given, then pt left the hospital without notifying anyone.   Discharge Diagnoses:  Principal Problem:   Acute on chronic systolic CHF (congestive heart failure) (HCC) Active Problems:   PAF (paroxysmal atrial fibrillation) (HCC)   Type 2 diabetes mellitus without complication (HCC)   Anxiety   Hypokalemia   Ischemic cardiomyopathy   Bradycardia   30 Day Unplanned Readmission Risk Score    Flowsheet Row ED to Hosp-Admission (Discharged) from 08/11/2021 in [facility]  30 Day Unplanned Readmission Risk Score (%) 20.15 Filed at 08/13/2021 1200       This score is the patient's risk of an unplanned readmission within 30 days of being discharged (0 -100%). The score is based on dignosis, age, lab data, medications, orders, and past utilization.   Low:  0-14.9   Medium: 15-21.9   High: 22-29.9   Extreme: 30 and above         Discharge Instructions:  Med Rec not done due to pt leaving AMA without notifying anyone.    Allergies  Allergen Reactions   Spironolactone Other (See Comments)    HYPERKALEMIA  The results of significant diagnostics from this hospitalization (including imaging, microbiology, ancillary and laboratory) are listed below for reference.   Consultations:   Procedures/Studies: DG Chest 2 View  Result Date: 08/11/2021 CLINICAL DATA:  60 year old male with shortness of breath, progressive for 1 week. EXAM: CHEST - 2 VIEW COMPARISON:  Portable  chest 05/13/2021 and earlier. FINDINGS: Seated AP and lateral views. Prior sternotomy. Stable mild to moderate cardiomegaly. Other mediastinal contours are within normal limits. Visualized tracheal air column is within normal limits. Lung volumes remain within normal limits. Evidence of small bilateral layering pleural effusions and increased pulmonary vascularity diffusely. Still, vascular congestion is regressed compared to September. No pneumothorax or consolidation. No acute osseous abnormality identified. Abdominal Calcified aortic atherosclerosis. Negative visible bowel gas pattern. IMPRESSION: 1. Chronic cardiomegaly with small bilateral pleural effusions. Vascular congestion without overt edema. 2.  Aortic Atherosclerosis (ICD10-I70.0). Electronically Signed   By: Odessa Fleming M.D.   On: 08/11/2021 07:44      Labs: BNP (last 3 results) Recent Labs    03/01/21 0351 05/13/21 0747 08/11/21 0644  BNP 920.7* 2,012.2* 2,855.4*   Basic Metabolic Panel: Recent Labs  Lab 08/11/21 0644 08/11/21 0802 08/13/21 0524  NA 135  --  139  K 3.1*  --  3.2*  CL 96*  --  101  CO2 29  --  27  GLUCOSE 167*  --  160*  BUN 23  --  22  CREATININE 1.18  --  0.99  CALCIUM 9.4  --  9.6  MG  --  2.2 2.1   Liver Function Tests: No results for input(s): AST, ALT, ALKPHOS, BILITOT, PROT, ALBUMIN in the last 168 hours. No results for input(s): LIPASE, AMYLASE in the last 168 hours. No results for input(s): AMMONIA in the last 168 hours. CBC: Recent Labs  Lab 08/11/21 0644 08/13/21 0524  WBC 12.9* 10.0  HGB 13.0 13.9  HCT 38.5* 42.5  MCV 93.9 92.8  PLT 254 271   Cardiac Enzymes: No results for input(s): CKTOTAL, CKMB, CKMBINDEX, TROPONINI in the last 168 hours. BNP: Invalid input(s): POCBNP CBG: Recent Labs  Lab 08/11/21 2239 08/12/21 1155 08/12/21 1719 08/12/21 2046 08/13/21 0845  GLUCAP 133* 187* 148* 159* 174*   D-Dimer No results for input(s): DDIMER in the last 72 hours. Hgb  A1c Recent Labs    08/11/21 1129  HGBA1C 7.0*   Lipid Profile No results for input(s): CHOL, HDL, LDLCALC, TRIG, CHOLHDL, LDLDIRECT in the last 72 hours. Thyroid function studies No results for input(s): TSH, T4TOTAL, T3FREE, THYROIDAB in the last 72 hours.  Invalid input(s): FREET3 Anemia work up No results for input(s): VITAMINB12, FOLATE, FERRITIN, TIBC, IRON, RETICCTPCT in the last 72 hours. Urinalysis    Component Value Date/Time   COLORURINE YELLOW (A) 01/06/2021 1200   APPEARANCEUR CLEAR (A) 01/06/2021 1200   APPEARANCEUR Clear 09/18/2014 2018   LABSPEC 1.013 01/06/2021 1200   LABSPEC 1.005 09/18/2014 2018   PHURINE 7.0 01/06/2021 1200   GLUCOSEU 50 (A) 01/06/2021 1200   GLUCOSEU 150 mg/dL 00/86/7619 5093   HGBUR NEGATIVE 01/06/2021 1200   BILIRUBINUR NEGATIVE 01/06/2021 1200   BILIRUBINUR Negative 09/18/2014 2018   KETONESUR NEGATIVE 01/06/2021 1200   PROTEINUR NEGATIVE 01/06/2021 1200   UROBILINOGEN 0.2 09/12/2013 1555   NITRITE NEGATIVE 01/06/2021 1200   LEUKOCYTESUR NEGATIVE 01/06/2021 1200   LEUKOCYTESUR Negative 09/18/2014 2018   Sepsis Labs Invalid input(s): PROCALCITONIN,  WBC,  LACTICIDVEN Microbiology Recent Results (from the past 240 hour(s))  Resp Panel by  RT-PCR (Flu A&B, Covid) Nasopharyngeal Swab     Status: None   Collection Time: 08/11/21  7:58 AM   Specimen: Nasopharyngeal Swab; Nasopharyngeal(NP) swabs in vial transport medium  Result Value Ref Range Status   SARS Coronavirus 2 by RT PCR NEGATIVE NEGATIVE Final    Comment: (NOTE) SARS-CoV-2 target nucleic acids are NOT DETECTED.  The SARS-CoV-2 RNA is generally detectable in upper respiratory specimens during the acute phase of infection. The lowest concentration of SARS-CoV-2 viral copies this assay can detect is 138 copies/mL. A negative result does not preclude SARS-Cov-2 infection and should not be used as the sole basis for treatment or other patient management decisions. A negative  result may occur with  improper specimen collection/handling, submission of specimen other than nasopharyngeal swab, presence of viral mutation(s) within the areas targeted by this assay, and inadequate number of viral copies(<138 copies/mL). A negative result must be combined with clinical observations, patient history, and epidemiological information. The expected result is Negative.  Fact Sheet for Patients:  EntrepreneurPulse.com.au  Fact Sheet for Healthcare Providers:  IncredibleEmployment.be  This test is no t yet approved or cleared by the [place] FDA and  has been authorized for detection and/or diagnosis of SARS-CoV-2 by FDA under an Emergency Use Authorization (EUA). This EUA will remain  in effect (meaning this test can be used) for the duration of the COVID-19 declaration under Section 564(b)(1) of the Act, 21 U.S.C.section 360bbb-3(b)(1), unless the authorization is terminated  or revoked sooner.       Influenza A by PCR NEGATIVE NEGATIVE Final   Influenza B by PCR NEGATIVE NEGATIVE Final    Comment: (NOTE) The Xpert Xpress SARS-CoV-2/FLU/RSV plus assay is intended as an aid in the diagnosis of influenza from Nasopharyngeal swab specimens and should not be used as a sole basis for treatment. Nasal washings and aspirates are unacceptable for Xpert Xpress SARS-CoV-2/FLU/RSV testing.  Fact Sheet for Patients: EntrepreneurPulse.com.au  Fact Sheet for Healthcare Providers: IncredibleEmployment.be  This test is not yet approved or cleared by the [place] FDA and has been authorized for detection and/or diagnosis of SARS-CoV-2 by FDA under an Emergency Use Authorization (EUA). This EUA will remain in effect (meaning this test can be used) for the duration of the COVID-19 declaration under Section 564(b)(1) of the Act, 21 U.S.C. section 360bbb-3(b)(1), unless the authorization is  terminated or revoked.  Performed at [facility], [address] [place], [place] [address]    No charge note.   Enzo Bi, MD  [facility] 08/13/2021, 2:08 PM

## 2021-08-16 ENCOUNTER — Telehealth: Payer: Self-pay

## 2021-08-19 NOTE — Progress Notes (Signed)
Patient ID: Marc [name], male    DOB: 10-08-1960, 60 y.o.   MRN: [id number]  HPI  Marc [name] is a 60 y/o male with a history of CAD, DM, hyperlipidemia, HTN, anxiety, paroxysmal atrial fibrillation, PAD, recent tobacco use and chronic heart failure.   Echo report from 03/01/21 reviewed and showed an EF of 35-40% along with moderate LVH.  RHC/LHC completed 03/04/21 and showed: Ost RCA to Prox RCA lesion is 100% stenosed. Mid LM to Dist LM lesion is 90% stenosed. Ost LAD to Prox LAD lesion is 100% stenosed. Ost Cx to Prox Cx lesion is 50% stenosed. Mid Cx to Dist Cx lesion is 60% stenosed. 3rd Mrg lesion is 90% stenosed. 1st LPL lesion is 90% stenosed. 2nd LPL lesion is 90% stenosed. SVG graft was visualized by angiography. Prox Graft to Mid Graft lesion is 30% stenosed. Origin to Prox Graft lesion is 100% stenosed. LIMA graft was visualized by angiography and is normal in caliber. The graft exhibits no disease. Dist LAD lesion is 60% stenosed. Mid LAD lesion is 70% stenosed.   1.  Severe underlying left main and three-vessel coronary artery disease with patent LIMA to LAD and patent SVG to OM2/ramus.  Occluded SVG to OM 3.  The RCA is known to be small and nondominant and occluded proximally with some left-to-right collaterals. 2.  Left ventricular angiography was not performed.  EF was moderately reduced by echo. 3.  Right heart catheterization showed mildly elevated wedge pressure at 20 mmHg with a prominent V wave, mild pulmonary hypertension and moderately reduced cardiac output.  Admitted 08/11/21 due to acute on chronic HF. Initially given IV lasix. Cardiology consult obtained. IV metoprolol given due to tachycardia due to AF RVR. Left AMA after 2 days. Admitted 05/13/21 due to worsening dyspnea. Cardiology consult obtained. Initially started on heparin infustion. Given IV lasix with transition to oral diuretics. Discharged after 2 days. Was in the ED 05/08/21 with acute on chronic HF  where he was treated and released. Admitted and ED visit in August. Admitted 03/01/21 due to acute on chronic HF. Initially given IV lasix with transition to oral diuretics. Cardiology consult obtained. Cath completed. AF converted to NSR. Dofetilide started. Discharged after 7 days.   He presents today for a follow-up visit with a chief complaint of weight loss. He says that he's had a decreased appetite since last here. He has no other symptoms and specifically denies any difficulty sleeping, dizziness, abdominal distention, palpitations, pedal edema, chest pain, shortness of breath, cough or fatigue.   Saw cardiology earlier today. Plan for AICD implant on 09/20/21  Past Medical History:  Diagnosis Date   Anxiety    Chronic systolic heart failure ([place])    a. 07/2013 Echo: Ejection fraction of 45-50% with possible inferior wall hypokinesis; b. 03/2018 Echo: EF 40-45%; c. 06/2020 Echo: EF 40-45%, glob HK. Mod LVH, gr1 DD. Nl PASP. Mild BAE. Triv Marc. Mild AoV sclerosis. Mild Ao dil (61mm).   Coronary artery disease    Significant left main and Severe three-vessel coronary artery disease in December of 2014. He underwent CABG in January of 2015 with LIMA to LAD, sequential SVG to ramus/OM 2 and SVG to OM 3   Diabetes mellitus without complication ([place])    Hyperlipidemia    Hypertension    Ischemic cardiomyopathy    a. 07/2013 Echo: EF 45-50%; b. 03/2018 Echo: EF 40-45%; c. 06/2020 Echo: EF 40-45%.   Medical non-compliance    MI (myocardial infarction) (  [place])    Obesity    PAD (peripheral artery disease) ([place])    a. 04/2020 PTA of L PT.   PAF (paroxysmal atrial fibrillation) ([place])    Tobacco abuse    Past Surgical History:  Procedure Laterality Date   AMPUTATION Left 04/08/2020   Procedure: AMPUTATION RAY;  Surgeon: Caroline More, DPM;  Location: [facility];  Service: Podiatry;  Laterality: Left;   AMPUTATION Left 06/25/2020   Procedure: AMPUTATION RAY 1ST RAY PARTIAL LEFT;  Surgeon: Caroline More, DPM;  Location: [facility];  Service: Podiatry;  Laterality: Left;   AMPUTATION TOE Right 02/29/2016   Procedure: AMPUTATION TOE;  Surgeon: Sharlotte Alamo, DPM;  Location: [facility];  Service: Podiatry;  Laterality: Right;  Great toe   CARDIAC CATHETERIZATION     [facility] no stent   CARDIOVERSION N/A 02/23/2021   Procedure: CARDIOVERSION;  Surgeon: Kate Sable, MD;  Location: [facility];  Service: Cardiovascular;  Laterality: N/A;   CORONARY ARTERY BYPASS GRAFT N/A 09/16/2013   Procedure: CORONARY ARTERY BYPASS GRAFTING (CABG);  Surgeon: Gaye Pollack, MD;  Location: [place];  Service: Open Heart Surgery;  Laterality: N/A;  CABG x four, using left internal mammary artery and right leg greater saphenous vein   INTRAOPERATIVE TRANSESOPHAGEAL ECHOCARDIOGRAM N/A 09/16/2013   Procedure: INTRAOPERATIVE TRANSESOPHAGEAL ECHOCARDIOGRAM;  Surgeon: Gaye Pollack, MD;  Location: [place] [facility];  Service: Open Heart Surgery;  Laterality: N/A;   knee surgery Right    LASIK Bilateral    LOWER EXTREMITY ANGIOGRAPHY Left 04/07/2020   Procedure: Lower Extremity Angiography;  Surgeon: Katha Cabal, MD;  Location: [place] [facility];  Service: Cardiovascular;  Laterality: Left;   LOWER EXTREMITY ANGIOGRAPHY Left 04/09/2020   Procedure: Lower Extremity Angiography (Pedal Approach);  Surgeon: Katha Cabal, MD;  Location: [place] [facility];  Service: Cardiovascular;  Laterality: Left;   RIGHT/LEFT HEART CATH AND CORONARY/GRAFT ANGIOGRAPHY N/A 03/04/2021   Procedure: RIGHT/LEFT HEART CATH AND CORONARY/GRAFT ANGIOGRAPHY;  Surgeon: Wellington Hampshire, MD;  Location: [place] [facility];  Service: Cardiovascular;  Laterality: N/A;   Family History  Problem Relation Age of Onset   Heart disease Mother    Social History   Tobacco Use   Smoking status: Former    Packs/day: 0.50    Years: 45.00    Pack years: 22.50    Types: Cigarettes    Quit date: 02/09/2021    Years since quitting: 0.5   Smokeless tobacco:  Never  Substance Use Topics   Alcohol use: No    Alcohol/week: 0.0 standard drinks   Allergies  Allergen Reactions   Spironolactone Other (See Comments)    HYPERKALEMIA   Prior to Admission medications   Medication Sig Start Date End Date Taking? Authorizing Provider  acetaminophen (TYLENOL) 500 MG tablet Take 1,000 mg by mouth every 6 (six) hours as needed for moderate pain or headache.   Yes [provider]  apixaban (ELIQUIS) 5 MG TABS tablet Take 1 tablet (5 mg total) by mouth 2 (two) times daily. 05/06/21  Yes Kate Sable, MD  atorvastatin (LIPITOR) 80 MG tablet Take 80 mg by mouth every evening.  01/31/18  Yes [provider]  calcium carbonate (TUMS - DOSED IN MG ELEMENTAL CALCIUM) 500 MG chewable tablet Chew 500 mg by mouth daily as needed for indigestion or heartburn.   Yes [provider]  dapagliflozin propanediol (FARXIGA) 10 MG TABS tablet Take 1 tablet (10 mg total) by mouth daily. 03/08/21  Yes Zhang,  Dekui, MD  diazepam (VALIUM) 5 MG tablet Take 5 mg by mouth every 8 (eight) hours as needed (severe anxiety).   Yes [provider]  dofetilide (TIKOSYN) 500 MCG capsule Take 1 capsule (500 mcg total) by mouth 2 (two) times daily. It is important you do not miss any doses of this medication. Call the office if you miss 3 doses. Make sure you notify the office with plenty of time for any refills. 08/05/21  Yes Agbor-Etang, Aaron Edelman, MD  DULoxetine (CYMBALTA) 30 MG capsule Take 30 mg by mouth daily. 08/10/21  Yes [provider]  insulin glargine (LANTUS SOLOSTAR) 100 UNIT/ML Solostar Pen Inject 42 Units into the skin daily. 01/07/21  Yes Rai, Ripudeep K, MD  isosorbide-hydrALAZINE (BIDIL) 20-37.5 MG tablet Take 1 tablet by mouth 3 (three) times daily.   Yes [provider]  metoprolol succinate (TOPROL-XL) 50 MG 24 hr tablet Take 1 tablet (50 mg total) by mouth daily. Take with or immediately following a meal. 08/09/21  Yes Agbor-Etang,  Aaron Edelman, MD  Naphazoline-Pheniramine (VISINE-A OP) Place 1 drop into both eyes daily as needed (allergies).   Yes [provider]  nitroGLYCERIN (NITROSTAT) 0.4 MG SL tablet Place 1 tablet (0.4 mg total) under the tongue every 5 (five) minutes x 3 doses as needed for chest pain. 05/15/21  Yes Lorella Nimrod, MD  potassium chloride (KLOR-CON) 20 MEQ tablet Take 1 tablet (20 mEq total) by mouth daily. Take 1 tab by mouth twice a day for 5 days (05/31/21-06/04/21), and then take 1 tab by mouth once a day. 05/31/21  Yes Agbor-Etang, Aaron Edelman, MD  sacubitril-valsartan (ENTRESTO) 97-103 MG Take 1 tablet by mouth 2 (two) times daily.   Yes [provider]  torsemide (DEMADEX) 20 MG tablet Take 3 tablets (60 mg total) by mouth daily. Take 2 tablets (40 MG) by mouth every AM, and take 1 tablet (20 MG) by mouth every PM. 08/22/21  Yes Agbor-Etang, Aaron Edelman, MD    Review of Systems  Constitutional:  Positive for appetite change (decreased). Negative for fatigue.  HENT:  Negative for congestion, postnasal drip and sore throat.   Eyes: Negative.   Respiratory:  Negative for cough, chest tightness and shortness of breath.   Cardiovascular:  Negative for chest pain, palpitations and leg swelling.  Gastrointestinal:  Negative for abdominal distention and abdominal pain.  Endocrine: Negative.   Genitourinary: Negative.   Musculoskeletal:  Negative for back pain and neck pain.  Skin: Negative.   Allergic/Immunologic: Negative.   Neurological:  Negative for dizziness and light-headedness.  Hematological:  Negative for adenopathy. Does not bruise/bleed easily.  Psychiatric/Behavioral:  Negative for dysphoric mood and sleep disturbance (sleeping on 1 pillow). The patient is not nervous/anxious.    Vitals:   08/22/21 0913  BP: 104/65  Pulse: (!) 58  Resp: 18  SpO2: 99%  Weight: 241 lb 6 oz (109.5 kg)  Height: 6\' 1"  (1.854 m)   Wt Readings from Last 3 Encounters:  08/22/21 241 lb 6 oz (109.5 kg)   08/22/21 246 lb (111.6 kg)  08/13/21 246 lb 14.4 oz (112 kg)   Lab Results  Component Value Date   CREATININE 0.99 08/13/2021   CREATININE 1.18 08/11/2021   CREATININE 1.26 06/08/2021   Physical Exam Vitals and nursing note reviewed.  Constitutional:      Appearance: Normal appearance.  HENT:     Head: Normocephalic and atraumatic.  Cardiovascular:     Rate and Rhythm: Bradycardia present. Rhythm irregular.  Pulmonary:  Effort: Pulmonary effort is normal. No respiratory distress.     Breath sounds: No wheezing or rales.  Abdominal:     General: There is no distension.     Palpations: Abdomen is soft.     Tenderness: There is no abdominal tenderness.  Musculoskeletal:        General: No tenderness.     Cervical back: Normal range of motion and neck supple.     Right lower leg: No edema.     Left lower leg: No edema.  Skin:    General: Skin is warm and dry.  Neurological:     General: No focal deficit present.     Mental Status: He is alert and oriented to person, place, and time.  Psychiatric:        Mood and Affect: Mood normal.        Behavior: Behavior normal.        Thought Content: Thought content normal.    Assessment & Plan:  1: Chronic heart failure with reduced ejection fraction- - NYHA class I - euvolemic today - weighing daily; reminded to weigh daily and call for an overnight weight gain of > 2 pounds or a weekly weight gain of > 5 pounds - weight down 15 pounds from last visit here 3 months ago - not adding salt and has been reading food labels for sodium content - saw cardiology (Agbor-Etang) earlier today - on GDMT of carvedilol, dapagliflozine and entresto - history of hyperkalemia so hasn't been on aldactone - BNP 9/98/22 was 2012.2  2: HTN- - BP looks good (104/65) - sees PCP at [facility] - BMP 08/13/21 reviewed and showed sodium 139, potassium 3.2, creatinine 0.99 and GFR >60  3: DM- - A1c 08/11/21 was 7.0% - glucose at home today  was 119  4: Atrial fibrillation- - saw EP Lalla Brothers) 07/27/21 - scheduled for BIV ICD 09/20/21 - magnesium 08/13/21 was 2.1   Medication bottles reviewed.   Return in 5 months or sooner for any questions/problems before then.

## 2021-08-22 ENCOUNTER — Encounter: Payer: Self-pay | Admitting: Family

## 2021-08-22 ENCOUNTER — Encounter: Payer: Self-pay | Admitting: Cardiology

## 2021-08-22 ENCOUNTER — Ambulatory Visit (INDEPENDENT_AMBULATORY_CARE_PROVIDER_SITE_OTHER): Payer: Medicaid Other | Admitting: Cardiology

## 2021-08-22 ENCOUNTER — Other Ambulatory Visit: Payer: Self-pay

## 2021-08-22 ENCOUNTER — Ambulatory Visit: Payer: Medicaid Other | Attending: Family | Admitting: Family

## 2021-08-22 VITALS — BP 110/64 | HR 116 | Ht 73.0 in | Wt 246.0 lb

## 2021-08-22 VITALS — BP 104/65 | HR 58 | Resp 18 | Ht 73.0 in | Wt 241.4 lb

## 2021-08-22 DIAGNOSIS — I110 Hypertensive heart disease with heart failure: Secondary | ICD-10-CM | POA: Insufficient documentation

## 2021-08-22 DIAGNOSIS — I5022 Chronic systolic (congestive) heart failure: Secondary | ICD-10-CM | POA: Insufficient documentation

## 2021-08-22 DIAGNOSIS — E1151 Type 2 diabetes mellitus with diabetic peripheral angiopathy without gangrene: Secondary | ICD-10-CM | POA: Diagnosis not present

## 2021-08-22 DIAGNOSIS — I255 Ischemic cardiomyopathy: Secondary | ICD-10-CM | POA: Diagnosis not present

## 2021-08-22 DIAGNOSIS — E785 Hyperlipidemia, unspecified: Secondary | ICD-10-CM | POA: Insufficient documentation

## 2021-08-22 DIAGNOSIS — I10 Essential (primary) hypertension: Secondary | ICD-10-CM | POA: Diagnosis not present

## 2021-08-22 DIAGNOSIS — I2510 Atherosclerotic heart disease of native coronary artery without angina pectoris: Secondary | ICD-10-CM

## 2021-08-22 DIAGNOSIS — I480 Paroxysmal atrial fibrillation: Secondary | ICD-10-CM | POA: Insufficient documentation

## 2021-08-22 DIAGNOSIS — Z87891 Personal history of nicotine dependence: Secondary | ICD-10-CM | POA: Insufficient documentation

## 2021-08-22 DIAGNOSIS — I4891 Unspecified atrial fibrillation: Secondary | ICD-10-CM | POA: Insufficient documentation

## 2021-08-22 DIAGNOSIS — I4819 Other persistent atrial fibrillation: Secondary | ICD-10-CM | POA: Diagnosis not present

## 2021-08-22 DIAGNOSIS — E1122 Type 2 diabetes mellitus with diabetic chronic kidney disease: Secondary | ICD-10-CM | POA: Diagnosis not present

## 2021-08-22 DIAGNOSIS — Z7901 Long term (current) use of anticoagulants: Secondary | ICD-10-CM | POA: Insufficient documentation

## 2021-08-22 DIAGNOSIS — Z794 Long term (current) use of insulin: Secondary | ICD-10-CM

## 2021-08-22 DIAGNOSIS — I739 Peripheral vascular disease, unspecified: Secondary | ICD-10-CM | POA: Insufficient documentation

## 2021-08-22 DIAGNOSIS — N1831 Chronic kidney disease, stage 3a: Secondary | ICD-10-CM

## 2021-08-22 MED ORDER — TORSEMIDE 20 MG PO TABS: 60.0000 mg | ORAL_TABLET | Freq: Every day | ORAL | 5 refills | Status: AC

## 2021-08-22 NOTE — Patient Instructions (Signed)
Continue weighing daily and call for an overnight weight gain of 3 pounds or more or a weekly weight gain of more than 5 pounds.  °

## 2021-08-22 NOTE — Progress Notes (Signed)
Cardiology Office Note:    Date:  08/22/2021   ID:  Marc [name], DOB 1961/06/15, MRN [id number]  PCP:  [place] Cardiologist:  Kate Sable, MD  [place] Electrophysiologist:  Vickie Epley, MD   Referring MD: [facility]*   Chief Complaint  Patient presents with   OTher    3 month follow up -- Recently in the ED. Meds reviewed verbally with patient.     History of Present Illness:    Marc [name] is a 60 y.o. male with a hx of CAD/CABG x 4 2015 ([place] to LAD SVG to Ramus /OM2, OM3) hypertension, hyperlipidemia, ICM EF 35-40%%, PAD, persistent A. Fib, former smoker x 40+who presents for follow-up.  Being seen for CHF and atrial fibrillation.  On Tikosyn for A. fib..  Denies palpitations.  Recently admitted due to CHF exacerbation, diuresed with IV Lasix.  Over the past week, he has increase his torsemide dose to 40 mg in the a.m., 20 mg daily p.m.  States breathing is better, denies edema.  Denies chest pain, denies palpitations.  Saw EP, AICD is being planned next month due to persistently reduced ejection fraction on CMR of 31%.   Prior notes CMR 07/2021 EF 31.4%, near transmural subendocardial scar in the lateral LV wall. Echo 02/2021, EF 35 to 40%, aortic valve sclerosis, mild MR [facility] 03/07/2021 showing patent LIMA to LAD, patent SVG to OM2/ramus.  Occluded SVG to OM 3.  Small nondominant RCA occluded proximally. CAD/CABG x 4 2015 ([place] to LAD SVG to Ramus /OM2, OM3) Echo 06/2020 mild to moderately reduced EF, 40 to 45%. Echo 03/2018 EF 40 to 45% Not on Aldactone due to CKD and hyperkalemia history.  Patient has history of nonhealing ulceration of the left foot, underwent a left great toe amputation with subsequent wound.  He is being followed up by vascular surgery.  Noninvasive studies have shown noncompressible vessels on the right with ABIs 1.07.  Podiatry is planning resection of left foot wound to improve  wound healing.   Past Medical History:  Diagnosis Date   Anxiety    Chronic systolic heart failure ([place])    a. 07/2013 Echo: Ejection fraction of 45-50% with possible inferior wall hypokinesis; b. 03/2018 Echo: EF 40-45%; c. 06/2020 Echo: EF 40-45%, glob HK. Mod LVH, gr1 DD. Nl PASP. Mild BAE. Triv MR. Mild AoV sclerosis. Mild Ao dil (55mm).   Coronary artery disease    Significant left main and Severe three-vessel coronary artery disease in December of 2014. He underwent CABG in January of 2015 with LIMA to LAD, sequential SVG to ramus/OM 2 and SVG to OM 3   Diabetes mellitus without complication ([place])    Hyperlipidemia    Hypertension    Ischemic cardiomyopathy    a. 07/2013 Echo: EF 45-50%; b. 03/2018 Echo: EF 40-45%; c. 06/2020 Echo: EF 40-45%.   Medical non-compliance    MI (myocardial infarction) ([place])    Obesity    PAD (peripheral artery disease) ([place])    a. 04/2020 PTA of L PT.   PAF (paroxysmal atrial fibrillation) ([place])    Tobacco abuse     Past Surgical History:  Procedure Laterality Date   AMPUTATION Left 04/08/2020   Procedure: AMPUTATION RAY;  Surgeon: Caroline More, DPM;  Location: [facility] ORS;  Service: Podiatry;  Laterality: Left;   AMPUTATION Left 06/25/2020   Procedure: AMPUTATION RAY 1ST RAY PARTIAL LEFT;  Surgeon: Caroline More, DPM;  Location: [facility] ORS;  Service: Podiatry;  Laterality: Left;   AMPUTATION TOE Right 02/29/2016   Procedure: AMPUTATION TOE;  Surgeon: Linus Galas, DPM;  Location: [facility] ORS;  Service: Podiatry;  Laterality: Right;  Great toe   CARDIAC CATHETERIZATION     [facility] no stent   CARDIOVERSION N/A 02/23/2021   Procedure: CARDIOVERSION;  Surgeon: Debbe Odea, MD;  Location: [facility] ORS;  Service: Cardiovascular;  Laterality: N/A;   CORONARY ARTERY BYPASS GRAFT N/A 09/16/2013   Procedure: CORONARY ARTERY BYPASS GRAFTING (CABG);  Surgeon: [name], MD;  Location: [facility] OR;  Service: Open Heart Surgery;  Laterality: N/A;  CABG x four, using  left internal mammary artery and right leg greater saphenous vein   INTRAOPERATIVE TRANSESOPHAGEAL ECHOCARDIOGRAM N/A 09/16/2013   Procedure: INTRAOPERATIVE TRANSESOPHAGEAL ECHOCARDIOGRAM;  Surgeon: [name], MD;  Location: [facility] OR;  Service: Open Heart Surgery;  Laterality: N/A;   knee surgery Right    LASIK Bilateral    LOWER EXTREMITY ANGIOGRAPHY Left 04/07/2020   Procedure: Lower Extremity Angiography;  Surgeon: Renford Dills, MD;  Location: [facility] INVASIVE CV LAB;  Service: Cardiovascular;  Laterality: Left;   LOWER EXTREMITY ANGIOGRAPHY Left 04/09/2020   Procedure: Lower Extremity Angiography (Pedal Approach);  Surgeon: Renford Dills, MD;  Location: [facility] INVASIVE CV LAB;  Service: Cardiovascular;  Laterality: Left;   RIGHT/LEFT HEART CATH AND CORONARY/GRAFT ANGIOGRAPHY N/A 03/04/2021   Procedure: RIGHT/LEFT HEART CATH AND CORONARY/GRAFT ANGIOGRAPHY;  Surgeon: Iran Ouch, MD;  Location: [facility] INVASIVE CV LAB;  Service: Cardiovascular;  Laterality: N/A;    Current Medications: Current Meds  Medication Sig   acetaminophen (TYLENOL) 500 MG tablet Take 1,000 mg by mouth every 6 (six) hours as needed for moderate pain or headache.   apixaban (ELIQUIS) 5 MG TABS tablet Take 1 tablet (5 mg total) by mouth 2 (two) times daily.   atorvastatin (LIPITOR) 80 MG tablet Take 80 mg by mouth every evening.    calcium carbonate (TUMS - DOSED IN MG ELEMENTAL CALCIUM) 500 MG chewable tablet Chew 500 mg by mouth daily as needed for indigestion or heartburn.   dapagliflozin propanediol (FARXIGA) 10 MG TABS tablet Take 1 tablet (10 mg total) by mouth daily.   diazepam (VALIUM) 5 MG tablet Take 5 mg by mouth every 8 (eight) hours as needed (severe anxiety).   dofetilide (TIKOSYN) 500 MCG capsule Take 1 capsule (500 mcg total) by mouth 2 (two) times daily. It is important you do not miss any doses of this medication. Call the office if you miss 3 doses. Make sure you notify the office with plenty of  time for any refills.   DULoxetine (CYMBALTA) 30 MG capsule Take 30 mg by mouth daily.   insulin glargine (LANTUS SOLOSTAR) 100 UNIT/ML Solostar Pen Inject 42 Units into the skin daily.   isosorbide-hydrALAZINE (BIDIL) 20-37.5 MG tablet Take 1 tablet by mouth 3 (three) times daily.   metoprolol succinate (TOPROL-XL) 50 MG 24 hr tablet Take 1 tablet (50 mg total) by mouth daily. Take with or immediately following a meal.   Naphazoline-Pheniramine (VISINE-A OP) Place 1 drop into both eyes daily as needed (allergies).   nitroGLYCERIN (NITROSTAT) 0.4 MG SL tablet Place 1 tablet (0.4 mg total) under the tongue every 5 (five) minutes x 3 doses as needed for chest pain.   potassium chloride (KLOR-CON) 20 MEQ tablet Take 1 tablet (20 mEq total) by mouth daily. Take 1 tab by mouth twice a day for 5 days (05/31/21-06/04/21), and then take 1 tab by  mouth once a day.   sacubitril-valsartan (ENTRESTO) 97-103 MG Take 1 tablet by mouth 2 (two) times daily.   [DISCONTINUED] torsemide (DEMADEX) 20 MG tablet Take 40 mg by mouth daily.     Allergies:   Spironolactone   Social History   Socioeconomic History   Marital status: Divorced    Spouse name: Not on file   Number of children: Not on file   Years of education: Not on file   Highest education level: Not on file  Occupational History   Not on file  Tobacco Use   Smoking status: Former    Packs/day: 0.50    Years: 45.00    Pack years: 22.50    Types: Cigarettes    Quit date: 02/09/2021    Years since quitting: 0.5   Smokeless tobacco: Never  Vaping Use   Vaping Use: Never used  Substance and Sexual Activity   Alcohol use: No    Alcohol/week: 0.0 standard drinks   Drug use: No   Sexual activity: Not on file  Other Topics Concern   Not on file  Social History Narrative   Not on file   Social Determinants of Health   Financial Resource Strain: Not on file  Food Insecurity: Not on file  Transportation Needs: Not on file  Physical Activity:  Not on file  Stress: Not on file  Social Connections: Not on file     Family History: The patient's family history includes Heart disease in his mother.  ROS:   Please see the history of present illness.     All other systems reviewed and are negative.  EKGs/Labs/Other Studies Reviewed:    The following studies were reviewed today:   EKG:  EKG is  ordered today.  The ekg ordered today demonstrates sinus rhythm, heart rate 109  Recent Labs: 01/21/2021: TSH 1.130 05/13/2021: ALT 19 08/11/2021: B Natriuretic Peptide 2,855.4 08/13/2021: BUN 22; Creatinine, Ser 0.99; Hemoglobin 13.9; Magnesium 2.1; Platelets 271; Potassium 3.2; Sodium 139  Recent Lipid Panel    Component Value Date/Time   CHOL 115 05/14/2021 0543   CHOL 135 07/21/2013 0759   TRIG 52 05/14/2021 0543   TRIG 152 07/21/2013 0759   HDL 43 05/14/2021 0543   HDL 39 (L) 07/21/2013 0759   CHOLHDL 2.7 05/14/2021 0543   VLDL 10 05/14/2021 0543   VLDL 30 07/21/2013 0759   LDLCALC 62 05/14/2021 0543   LDLCALC 66 07/21/2013 0759     Risk Assessment/Calculations:      Physical Exam:    VS:  BP 110/64 (BP Location: Left Arm, Patient Position: Sitting, Cuff Size: Large)    Pulse (!) 116    Ht 6\' 1"  (1.854 m)    Wt 246 lb (111.6 kg)    SpO2 98%    BMI 32.46 kg/m     Wt Readings from Last 3 Encounters:  08/22/21 246 lb (111.6 kg)  08/13/21 246 lb 14.4 oz (112 kg)  07/27/21 251 lb 3.2 oz (113.9 kg)     GEN:  Well nourished, well developed in no acute distress HEENT: Normal NECK: No JVD; No carotid bruits CARDIAC: Regular rate and rhythm, RESPIRATORY:  Clear to auscultation without rales, wheezing or rhonchi  ABDOMEN: Soft, non-tender, distended MUSCULOSKELETAL: no  SKIN: Warm and dry NEUROLOGIC:  Alert and oriented x 3 PSYCHIATRIC:  Normal affect   ASSESSMENT:    1. Persistent atrial fibrillation ([place])   2. Coronary artery disease involving native coronary artery of native heart without angina  pectoris   3.  Ischemic cardiomyopathy      PLAN:    In order of problems listed above:  Persistent A. fib, CHA2DS2-VASc of 4.  Maintaining sinus rhythm on Tikosyn.  Heart rate controlled.  Continue Toprol-XL, Eliquis.  CAD, status post CABG x4.  Last EF 31.4% on CMR, near transmural lateral LV wall scar.  AICD being planned next month.  LHC 03/2021 occ SVG to OM 3.  Patent LIMA to LAD, patent SVG to ramus, OM 2.  Denies symptoms of chest pain.  Continue Eliquis, Lipitor.   Ischemic cardiomyopathy, EF 31 % on CMR.  AICD maximal.  Describes NYHA class II-III symptoms.  Appears likely overloaded, abdominal distention.  Continue torsemide 40 mg in a.m., 20 mg in p.m.  Check BMP today.  Continue Entresto, Toprol-XL, Farxiga, BiDil.    Follow-up in 6 months  Total encounter time 40 minutes  Greater than 50% was spent in counseling and coordination of care with the patient Has some mobility issues, advised to follow-up with PCP regarding PT/need for assistive devices.    Medication Adjustments/Labs and Tests Ordered: Current medicines are reviewed at length with the patient today.  Concerns regarding medicines are outlined above.  Orders Placed This Encounter  Procedures   Basic metabolic panel   EKG [id number]      Meds ordered this encounter  Medications   torsemide (DEMADEX) 20 MG tablet    Sig: Take 3 tablets (60 mg total) by mouth daily. Take 2 tablets (40 MG) by mouth every AM, and take 1 tablet (20 MG) by mouth every PM.    Dispense:  90 tablet    Refill:  5      Patient Instructions  Medication Instructions:   Your physician recommends that you continue on your current medications as directed. Please refer to the Current Medication list given to you today.  *If you need a refill on your cardiac medications before your next appointment, please call your pharmacy*   Lab Work:  BMP to be drawn in office today.   If you have labs (blood work) drawn today and your tests are completely  normal, you will receive your results only by: [place] (if you have MyChart) OR A paper copy in the mail If you have any lab test that is abnormal or we need to change your treatment, we will call you to review the results.   Testing/Procedures: None ordered   Follow-Up: At [facility], you and your health needs are our priority.  As part of our continuing mission to provide you with exceptional heart care, we have created designated Provider Care Teams.  These Care Teams include your primary Cardiologist (physician) and Advanced Practice Providers (APPs -  Physician Assistants and Nurse Practitioners) who all work together to provide you with the care you need, when you need it.  We recommend signing up for the patient portal called "MyChart".  Sign up information is provided on this After Visit Summary.  MyChart is used to connect with patients for Virtual Visits (Telemedicine).  Patients are able to view lab/test results, encounter notes, upcoming appointments, etc.  Non-urgent messages can be sent to your provider as well.   To learn more about what you can do with MyChart, go to [email or web address].    Your next appointment:   6 month(s)  The format for your next appointment:   In Person  Provider:   You may see Kate Sable, MD or one of the following Advanced  Practice Providers on your designated Care Team:   Murray Hodgkins, NP Christell Faith, PA-C Cadence Kathlen Mody, [place]    Other Instructions     Signed, Kate Sable, MD  08/22/2021 9:24 AM    [place]

## 2021-08-22 NOTE — Patient Instructions (Signed)
Medication Instructions:  ° °Your physician recommends that you continue on your current medications as directed. Please refer to the Current Medication list given to you today. ° °*If you need a refill on your cardiac medications before your next appointment, please call your pharmacy* ° ° °Lab Work: ° °BMP to be drawn in office today. ° °If you have labs (blood work) drawn today and your tests are completely normal, you will receive your results only by: °MyChart Message (if you have MyChart) OR °A paper copy in the mail °If you have any lab test that is abnormal or we need to change your treatment, we will call you to review the results. ° ° °Testing/Procedures: °None ordered ° ° °Follow-Up: °At [facility], you and your health needs are our priority.  As part of our continuing mission to provide you with exceptional heart care, we have created designated Provider Care Teams.  These Care Teams include your primary Cardiologist (physician) and Advanced Practice Providers (APPs -  Physician Assistants and Nurse Practitioners) who all work together to provide you with the care you need, when you need it. ° °We recommend signing up for the patient portal called "MyChart".  Sign up information is provided on this After Visit Summary.  MyChart is used to connect with patients for Virtual Visits (Telemedicine).  Patients are able to view lab/test results, encounter notes, upcoming appointments, etc.  Non-urgent messages can be sent to your provider as well.   °To learn more about what you can do with MyChart, go to [email or web address].   ° °Your next appointment:   °6 month(s) ° °The format for your next appointment:   °In Person ° °Provider:   °You may see Brian Agbor-Etang, MD or one of the following Advanced Practice Providers on your designated Care Team:   °Christopher Berge, NP °Ryan Dunn, PA-C °Cadence Furth, PA-C  ° ° °Other Instructions ° ° °

## 2021-08-23 LAB — BASIC METABOLIC PANEL
BUN/Creatinine Ratio: 22 (ref 10–24)
BUN: 28 mg/dL — ABNORMAL HIGH (ref 8–27)
CO2: 27 mmol/L (ref 20–29)
Calcium: 10.1 mg/dL (ref 8.6–10.2)
Chloride: 99 mmol/L (ref 96–106)
Creatinine, Ser: 1.29 mg/dL — ABNORMAL HIGH (ref 0.76–1.27)
Glucose: 237 mg/dL — ABNORMAL HIGH (ref 70–99)
Potassium: 3.5 mmol/L (ref 3.5–5.2)
Sodium: 142 mmol/L (ref 134–144)
eGFR: 63 mL/min/{1.73_m2} (ref >59)

## 2021-08-26 ENCOUNTER — Telehealth: Payer: Self-pay

## 2021-08-26 DIAGNOSIS — I5023 Acute on chronic systolic (congestive) heart failure: Secondary | ICD-10-CM

## 2021-08-26 NOTE — Telephone Encounter (Signed)
Margrett Rud, [place]  08/26/2021  1:05 PM EST Back to Top    No answer. LMOV.  Phone note started.    Debbe Odea, MD  08/26/2021 10:27 AM EST     Cr slightly elevated. Recheck bmp in 1 week. Cont meds as prescribed

## 2021-09-02 ENCOUNTER — Other Ambulatory Visit (INDEPENDENT_AMBULATORY_CARE_PROVIDER_SITE_OTHER): Payer: Medicaid Other

## 2021-09-02 ENCOUNTER — Other Ambulatory Visit: Payer: Self-pay

## 2021-09-02 DIAGNOSIS — I5023 Acute on chronic systolic (congestive) heart failure: Secondary | ICD-10-CM | POA: Diagnosis not present

## 2021-09-02 NOTE — Addendum Note (Signed)
Addended by: Gibson Ramp on: 09/02/2021 08:22 AM   Modules accepted: Orders

## 2021-09-02 NOTE — Telephone Encounter (Signed)
Called patient and scheduled him for a lab draw today.

## 2021-09-03 LAB — BASIC METABOLIC PANEL
BUN/Creatinine Ratio: 18 (ref 10–24)
BUN: 18 mg/dL (ref 8–27)
CO2: 28 mmol/L (ref 20–29)
Calcium: 9.8 mg/dL (ref 8.6–10.2)
Chloride: 95 mmol/L — ABNORMAL LOW (ref 96–106)
Creatinine, Ser: 1.01 mg/dL (ref 0.76–1.27)
Glucose: 143 mg/dL — ABNORMAL HIGH (ref 70–99)
Potassium: 3.5 mmol/L (ref 3.5–5.2)
Sodium: 139 mmol/L (ref 134–144)
eGFR: 84 mL/min/{1.73_m2} (ref >59)

## 2021-09-06 ENCOUNTER — Telehealth: Payer: Self-pay

## 2021-09-06 DIAGNOSIS — I5023 Acute on chronic systolic (congestive) heart failure: Secondary | ICD-10-CM

## 2021-09-06 NOTE — Telephone Encounter (Signed)
-----   Message from Debbe Odea, MD sent at 09/05/2021 10:45 AM EST ----- Creatinine normal , potassium low normal.  Start KCl 10 mEq daily.

## 2021-09-08 NOTE — Telephone Encounter (Signed)
Called and spoke with patient. It was listed in patients med list that he is already taking KCL 20 MEQ daily, and when I asked him if he is taking this, he informed me that he is taking 10 MEQ daily. I advised that he take 20 MEQ for the next 3 days until I can clarify with Dr. Azucena Cecil next week if he wants to increase to 20 MEQ daily. Patient verbalized understanding and agreed with plan.

## 2021-09-12 ENCOUNTER — Telehealth: Payer: Self-pay | Admitting: Cardiology

## 2021-09-12 DIAGNOSIS — I4819 Other persistent atrial fibrillation: Secondary | ICD-10-CM

## 2021-09-12 MED ORDER — DOFETILIDE 500 MCG PO CAPS: 500.0000 ug | ORAL_CAPSULE | Freq: Two times a day (BID) | ORAL | 1 refills | Status: AC

## 2021-09-12 NOTE — Telephone Encounter (Signed)
°*  STAT* If patient is at the pharmacy, call can be transferred to refill team.   1. Which medications need to be refilled? (please list name of each medication and dose if known) Dofetilide  2. Which pharmacy/location (including street and city if local pharmacy) is medication to be sent to? [facility]  3. Do they need a 30 day or 90 day supply? 90 day supply

## 2021-09-13 ENCOUNTER — Other Ambulatory Visit (INDEPENDENT_AMBULATORY_CARE_PROVIDER_SITE_OTHER): Payer: Medicaid Other

## 2021-09-13 ENCOUNTER — Other Ambulatory Visit: Payer: Self-pay

## 2021-09-13 DIAGNOSIS — I4819 Other persistent atrial fibrillation: Secondary | ICD-10-CM | POA: Diagnosis not present

## 2021-09-13 DIAGNOSIS — I5022 Chronic systolic (congestive) heart failure: Secondary | ICD-10-CM

## 2021-09-13 DIAGNOSIS — I4510 Unspecified right bundle-branch block: Secondary | ICD-10-CM

## 2021-09-13 IMAGING — DX DG CHEST 1V PORT
1 series · 1 of 1 positions shown · non-contrast
Comparison: Portable chest 01/06/2021 and earlier.

CLINICAL DATA: 59-year-old male with shortness of breath since 3633
hours.

EXAM:
PORTABLE CHEST 1 VIEW

[chest ap]
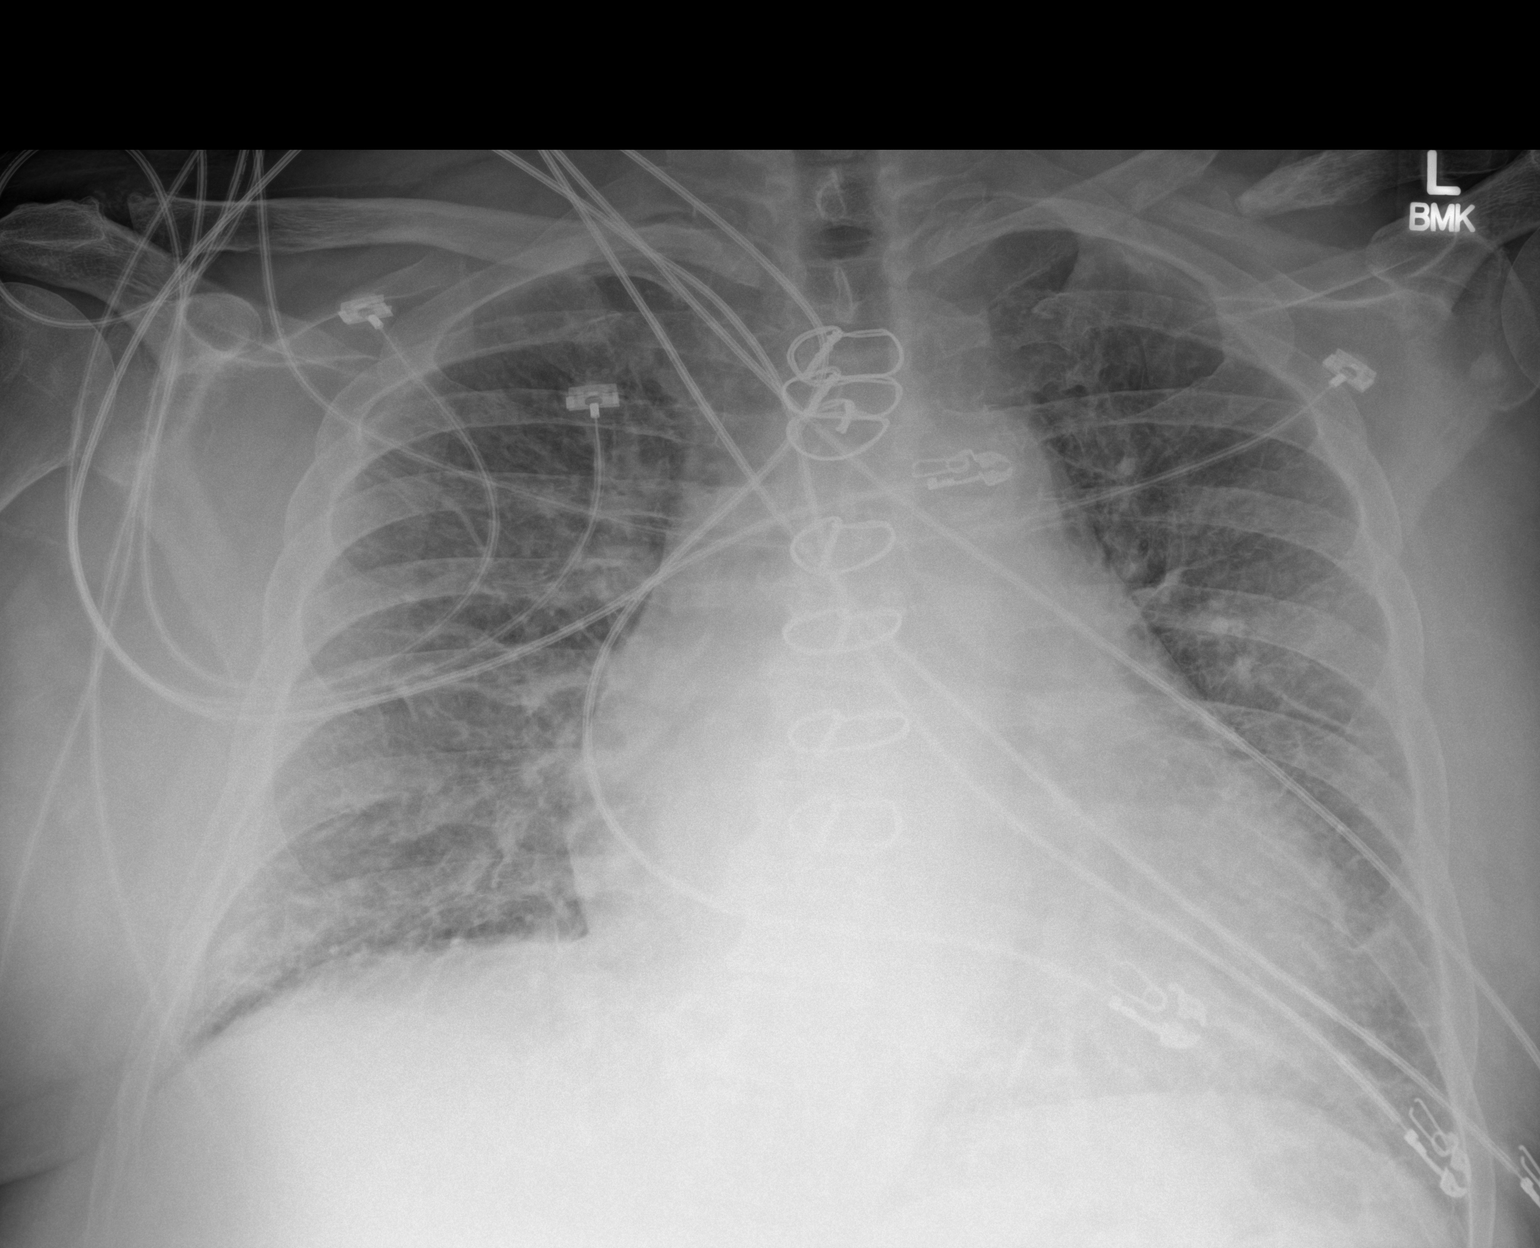

[1 of 1 positions shown; findings below may reference images not displayed]

FINDINGS: Portable AP upright view at 1662 hours. Mildly lower lung volumes.
Stable cardiomegaly and mediastinal contours. Prior sternotomy.
Visualized tracheal air column is within normal limits. Mildly
increased interstitial markings in both lungs since last month. No
pneumothorax, pleural effusion or consolidation. Chronic un-healed
left clavicle fracture. Stable visualized osseous structures.
IMPRESSION: Chronic cardiomegaly with lower lung volumes with mildly increased
interstitial markings since last month.
Consider mild or developing interstitial edema. Viral/atypical
respiratory infection not excluded.

## 2021-09-14 LAB — CBC WITH DIFFERENTIAL/PLATELET
Basophils Absolute: 0.1 10*3/uL (ref 0.0–0.2)
Basos: 1 %
EOS (ABSOLUTE): 0.3 10*3/uL (ref 0.0–0.4)
Eos: 5 %
Hematocrit: 38.4 % (ref 37.5–51.0)
Hemoglobin: 13.4 g/dL (ref 13.0–17.7)
Immature Grans (Abs): 0 10*3/uL (ref 0.0–0.1)
Immature Granulocytes: 0 %
Lymphocytes Absolute: 1 10*3/uL (ref 0.7–3.1)
Lymphs: 14 %
MCH: 30.9 pg (ref 26.6–33.0)
MCHC: 34.9 g/dL (ref 31.5–35.7)
MCV: 89 fL (ref 79–97)
Monocytes Absolute: 0.5 10*3/uL (ref 0.1–0.9)
Monocytes: 7 %
Neutrophils Absolute: 5.3 10*3/uL (ref 1.4–7.0)
Neutrophils: 73 %
Platelets: 288 10*3/uL (ref 150–450)
RBC: 4.33 x10E6/uL (ref 4.14–5.80)
RDW: 13.6 % (ref 11.6–15.4)
WBC: 7.1 10*3/uL (ref 3.4–10.8)

## 2021-09-14 LAB — BASIC METABOLIC PANEL
BUN/Creatinine Ratio: 16 (ref 10–24)
BUN: 18 mg/dL (ref 8–27)
CO2: 29 mmol/L (ref 20–29)
Calcium: 9.7 mg/dL (ref 8.6–10.2)
Chloride: 96 mmol/L (ref 96–106)
Creatinine, Ser: 1.12 mg/dL (ref 0.76–1.27)
Glucose: 239 mg/dL — ABNORMAL HIGH (ref 70–99)
Potassium: 3 mmol/L — ABNORMAL LOW (ref 3.5–5.2)
Sodium: 139 mmol/L (ref 134–144)
eGFR: 74 mL/min/{1.73_m2} (ref >59)

## 2021-09-15 MED ORDER — POTASSIUM CHLORIDE CRYS ER 20 MEQ PO TBCR: 40.0000 meq | EXTENDED_RELEASE_TABLET | Freq: Every day | ORAL | 5 refills | Status: AC

## 2021-09-15 NOTE — Telephone Encounter (Signed)
Spoke with Dr. Garen Lah and he recommended that the patient increase his Potassium to 40 MEQ daily and to have a repeat BMP in 1-2 weeks.   Lab is scheduled for 09/28/21. Patient verbalized understanding and agreed with plan.

## 2021-09-20 ENCOUNTER — Ambulatory Visit (HOSPITAL_COMMUNITY): Admission: RE | Admit: 2021-09-20 | Payer: Medicaid Other | Source: Ambulatory Visit | Admitting: Cardiology

## 2021-09-20 SURGERY — BIV ICD INSERTION CRT-D

## 2021-09-28 ENCOUNTER — Other Ambulatory Visit: Payer: Medicaid Other

## 2021-10-05 ENCOUNTER — Ambulatory Visit: Payer: Medicaid Other

## 2021-10-05 NOTE — Pre-Procedure Instructions (Signed)
Attempted to call patient regarding procedure instructions for tomorrow.  No answer. 

## 2021-10-05 DEATH — deceased

## 2021-12-21 ENCOUNTER — Encounter: Payer: Medicaid Other | Admitting: Cardiology

## 2022-01-18 ENCOUNTER — Ambulatory Visit: Payer: Medicaid Other | Admitting: Family

## 2022-02-20 ENCOUNTER — Ambulatory Visit: Payer: Medicaid Other | Admitting: Cardiology
